# Patient Record
Sex: Female | Born: 1955 | Race: Black or African American | Hispanic: No | Smoking: Former smoker
Health system: Southern US, Community
[De-identification: ages and names within clinical notes are randomized; demographics above are authoritative.]

## PROBLEM LIST (undated history)

## (undated) DIAGNOSIS — I82409 Acute embolism and thrombosis of unspecified deep veins of unspecified lower extremity: Secondary | ICD-10-CM

## (undated) DIAGNOSIS — K5730 Diverticulosis of large intestine without perforation or abscess without bleeding: Secondary | ICD-10-CM

## (undated) DIAGNOSIS — Z8719 Personal history of other diseases of the digestive system: Secondary | ICD-10-CM

## (undated) DIAGNOSIS — I10 Essential (primary) hypertension: Secondary | ICD-10-CM

## (undated) DIAGNOSIS — K750 Abscess of liver: Secondary | ICD-10-CM

## (undated) DIAGNOSIS — N838 Other noninflammatory disorders of ovary, fallopian tube and broad ligament: Secondary | ICD-10-CM

## (undated) DIAGNOSIS — R7309 Other abnormal glucose: Secondary | ICD-10-CM

## (undated) DIAGNOSIS — K589 Irritable bowel syndrome without diarrhea: Secondary | ICD-10-CM

## (undated) DIAGNOSIS — N83202 Unspecified ovarian cyst, left side: Secondary | ICD-10-CM

## (undated) DIAGNOSIS — K651 Peritoneal abscess: Secondary | ICD-10-CM

## (undated) DIAGNOSIS — K5732 Diverticulitis of large intestine without perforation or abscess without bleeding: Secondary | ICD-10-CM

## (undated) DIAGNOSIS — H409 Unspecified glaucoma: Secondary | ICD-10-CM

## (undated) DIAGNOSIS — E669 Obesity, unspecified: Secondary | ICD-10-CM

## (undated) DIAGNOSIS — E559 Vitamin D deficiency, unspecified: Secondary | ICD-10-CM

## (undated) DIAGNOSIS — Z9289 Personal history of other medical treatment: Secondary | ICD-10-CM

## (undated) DIAGNOSIS — M1990 Unspecified osteoarthritis, unspecified site: Secondary | ICD-10-CM

## (undated) DIAGNOSIS — K8020 Calculus of gallbladder without cholecystitis without obstruction: Secondary | ICD-10-CM

## (undated) DIAGNOSIS — E7800 Pure hypercholesterolemia, unspecified: Secondary | ICD-10-CM

## (undated) DIAGNOSIS — M25562 Pain in left knee: Secondary | ICD-10-CM

## (undated) HISTORY — DX: Other abnormal glucose: R73.09

## (undated) HISTORY — DX: Vitamin D deficiency, unspecified: E55.9

## (undated) HISTORY — DX: Pure hypercholesterolemia, unspecified: E78.00

## (undated) HISTORY — DX: Pain in left knee: M25.562

## (undated) HISTORY — DX: Irritable bowel syndrome, unspecified: K58.9

## (undated) HISTORY — DX: Calculus of gallbladder without cholecystitis without obstruction: K80.20

## (undated) HISTORY — DX: Personal history of other diseases of the digestive system: Z87.19

## (undated) HISTORY — DX: Diverticulitis of large intestine without perforation or abscess without bleeding: K57.32

## (undated) HISTORY — DX: Obesity, unspecified: E66.9

## (undated) HISTORY — DX: Diverticulosis of large intestine without perforation or abscess without bleeding: K57.30

## (undated) HISTORY — DX: Acute embolism and thrombosis of unspecified deep veins of unspecified lower extremity: I82.409

## (undated) HISTORY — DX: Essential (primary) hypertension: I10

## (undated) HISTORY — DX: Unspecified glaucoma: H40.9

## (undated) HISTORY — DX: Abscess of liver: K75.0

## (undated) HISTORY — DX: Peritoneal abscess: K65.1

## (undated) HISTORY — DX: Unspecified ovarian cyst, left side: N83.202

---

## 1998-05-02 HISTORY — PX: COLONOSCOPY: SHX174

## 1999-01-11 ENCOUNTER — Other Ambulatory Visit: Admission: RE | Admit: 1999-01-11 | Discharge: 1999-01-11 | Payer: Self-pay | Admitting: Family Medicine

## 2002-09-03 ENCOUNTER — Encounter: Payer: Self-pay | Admitting: Family Medicine

## 2003-09-03 ENCOUNTER — Encounter: Payer: Self-pay | Admitting: Family Medicine

## 2003-09-09 ENCOUNTER — Emergency Department (HOSPITAL_COMMUNITY): Admission: EM | Admit: 2003-09-09 | Discharge: 2003-09-09 | Payer: Self-pay | Admitting: Family Medicine

## 2004-05-11 ENCOUNTER — Emergency Department (HOSPITAL_COMMUNITY): Admission: EM | Admit: 2004-05-11 | Discharge: 2004-05-11 | Payer: Self-pay | Admitting: Family Medicine

## 2004-05-12 ENCOUNTER — Emergency Department (HOSPITAL_COMMUNITY): Admission: EM | Admit: 2004-05-12 | Discharge: 2004-05-12 | Payer: Self-pay | Admitting: Family Medicine

## 2004-05-18 ENCOUNTER — Emergency Department (HOSPITAL_COMMUNITY): Admission: EM | Admit: 2004-05-18 | Discharge: 2004-05-18 | Payer: Self-pay | Admitting: Family Medicine

## 2004-05-25 ENCOUNTER — Emergency Department (HOSPITAL_COMMUNITY): Admission: EM | Admit: 2004-05-25 | Discharge: 2004-05-25 | Payer: Self-pay | Admitting: Family Medicine

## 2004-05-28 ENCOUNTER — Ambulatory Visit: Payer: Self-pay | Admitting: Family Medicine

## 2004-06-23 ENCOUNTER — Ambulatory Visit: Payer: Self-pay | Admitting: Family Medicine

## 2004-06-23 LAB — CONVERTED CEMR LAB: Blood Glucose, Fasting: 123 mg/dL

## 2004-06-29 ENCOUNTER — Ambulatory Visit: Payer: Self-pay | Admitting: Family Medicine

## 2004-06-29 ENCOUNTER — Other Ambulatory Visit: Admission: RE | Admit: 2004-06-29 | Discharge: 2004-06-29 | Payer: Self-pay | Admitting: *Deleted

## 2004-06-29 LAB — CONVERTED CEMR LAB: Pap Smear: NORMAL

## 2004-07-30 ENCOUNTER — Ambulatory Visit: Payer: Self-pay | Admitting: Family Medicine

## 2004-10-21 ENCOUNTER — Ambulatory Visit: Payer: Self-pay | Admitting: Family Medicine

## 2004-10-26 ENCOUNTER — Ambulatory Visit: Payer: Self-pay | Admitting: Family Medicine

## 2004-10-26 LAB — CONVERTED CEMR LAB
RBC count: 4.31 10*6/uL
WBC, blood: 4.4 10*3/uL

## 2004-12-07 ENCOUNTER — Ambulatory Visit: Payer: Self-pay | Admitting: Family Medicine

## 2005-09-05 ENCOUNTER — Ambulatory Visit: Payer: Self-pay | Admitting: Family Medicine

## 2007-06-11 ENCOUNTER — Encounter: Payer: Self-pay | Admitting: Family Medicine

## 2007-06-11 DIAGNOSIS — Z6841 Body Mass Index (BMI) 40.0 and over, adult: Secondary | ICD-10-CM

## 2007-06-12 ENCOUNTER — Ambulatory Visit: Payer: Self-pay | Admitting: Family Medicine

## 2007-06-12 DIAGNOSIS — E1169 Type 2 diabetes mellitus with other specified complication: Secondary | ICD-10-CM | POA: Insufficient documentation

## 2007-06-12 DIAGNOSIS — E785 Hyperlipidemia, unspecified: Secondary | ICD-10-CM | POA: Insufficient documentation

## 2007-06-12 LAB — CONVERTED CEMR LAB
ALT: 17 units/L (ref 0–35)
AST: 16 units/L (ref 0–37)
Albumin: 3.6 g/dL (ref 3.5–5.2)
Bilirubin, Direct: 0.1 mg/dL (ref 0.0–0.3)
Calcium: 9.2 mg/dL (ref 8.4–10.5)
Chloride: 100 meq/L (ref 96–112)
Cholesterol: 174 mg/dL (ref 0–200)
Creatinine, Ser: 1 mg/dL (ref 0.4–1.2)
GFR calc non Af Amer: 63 mL/min
Glucose, Bld: 112 mg/dL — ABNORMAL HIGH (ref 70–99)
HDL: 45.6 mg/dL (ref >39.0)
Sodium: 138 meq/L (ref 135–145)
TSH: 2.18 microintl units/mL (ref 0.35–5.50)
Total Bilirubin: 0.7 mg/dL (ref 0.3–1.2)

## 2007-06-18 ENCOUNTER — Encounter: Payer: Self-pay | Admitting: Family Medicine

## 2007-06-18 ENCOUNTER — Other Ambulatory Visit: Admission: RE | Admit: 2007-06-18 | Discharge: 2007-06-18 | Payer: Self-pay | Admitting: Family Medicine

## 2007-06-18 ENCOUNTER — Ambulatory Visit: Payer: Self-pay | Admitting: Family Medicine

## 2007-06-18 DIAGNOSIS — I10 Essential (primary) hypertension: Secondary | ICD-10-CM | POA: Insufficient documentation

## 2007-06-18 LAB — CONVERTED CEMR LAB: Pap Smear: NORMAL

## 2007-06-21 ENCOUNTER — Encounter (INDEPENDENT_AMBULATORY_CARE_PROVIDER_SITE_OTHER): Payer: Self-pay | Admitting: *Deleted

## 2007-07-06 ENCOUNTER — Ambulatory Visit: Payer: Self-pay | Admitting: Family Medicine

## 2007-07-09 ENCOUNTER — Encounter (INDEPENDENT_AMBULATORY_CARE_PROVIDER_SITE_OTHER): Payer: Self-pay | Admitting: *Deleted

## 2007-07-27 ENCOUNTER — Ambulatory Visit: Payer: Self-pay | Admitting: Family Medicine

## 2007-10-26 ENCOUNTER — Telehealth: Payer: Self-pay | Admitting: Family Medicine

## 2007-10-27 ENCOUNTER — Ambulatory Visit: Payer: Self-pay | Admitting: Family Medicine

## 2007-10-27 ENCOUNTER — Ambulatory Visit (HOSPITAL_COMMUNITY): Admission: RE | Admit: 2007-10-27 | Discharge: 2007-10-27 | Payer: Self-pay | Admitting: Family Medicine

## 2007-10-29 ENCOUNTER — Ambulatory Visit: Payer: Self-pay | Admitting: Family Medicine

## 2007-11-09 ENCOUNTER — Ambulatory Visit: Payer: Self-pay | Admitting: Family Medicine

## 2008-03-31 ENCOUNTER — Telehealth: Payer: Self-pay | Admitting: Family Medicine

## 2008-04-02 ENCOUNTER — Ambulatory Visit: Payer: Self-pay | Admitting: Family Medicine

## 2008-04-02 DIAGNOSIS — K589 Irritable bowel syndrome without diarrhea: Secondary | ICD-10-CM | POA: Insufficient documentation

## 2008-04-09 ENCOUNTER — Ambulatory Visit: Payer: Self-pay | Admitting: Family Medicine

## 2008-08-20 ENCOUNTER — Ambulatory Visit: Payer: Self-pay | Admitting: Family Medicine

## 2008-08-20 LAB — CONVERTED CEMR LAB
ALT: 21 units/L (ref 0–35)
AST: 19 units/L (ref 0–37)
Albumin: 3.7 g/dL (ref 3.5–5.2)
Alkaline Phosphatase: 53 units/L (ref 39–117)
Basophils Relative: 0.2 % (ref 0.0–3.0)
Calcium: 9.1 mg/dL (ref 8.4–10.5)
Eosinophils Relative: 1 % (ref 0.0–5.0)
GFR calc non Af Amer: 75.33 mL/min (ref >60)
Glucose, Bld: 104 mg/dL — ABNORMAL HIGH (ref 70–99)
HCT: 37.5 % (ref 36.0–46.0)
HDL: 48.6 mg/dL (ref >39.00)
Hemoglobin: 12.7 g/dL (ref 12.0–15.0)
Lymphocytes Relative: 48.8 % — ABNORMAL HIGH (ref 12.0–46.0)
Lymphs Abs: 2.3 10*3/uL (ref 0.7–4.0)
Monocytes Relative: 7 % (ref 3.0–12.0)
Neutro Abs: 2 10*3/uL (ref 1.4–7.7)
Potassium: 3.8 meq/L (ref 3.5–5.1)
RBC: 4.5 M/uL (ref 3.87–5.11)
Sodium: 141 meq/L (ref 135–145)
TSH: 3.25 microintl units/mL (ref 0.35–5.50)
Total CHOL/HDL Ratio: 4
Total Protein: 7.6 g/dL (ref 6.0–8.3)
VLDL: 13 mg/dL (ref 0.0–40.0)

## 2008-08-27 ENCOUNTER — Encounter: Payer: Self-pay | Admitting: Family Medicine

## 2008-08-27 ENCOUNTER — Ambulatory Visit: Payer: Self-pay | Admitting: Family Medicine

## 2008-08-27 ENCOUNTER — Other Ambulatory Visit: Admission: RE | Admit: 2008-08-27 | Discharge: 2008-08-27 | Payer: Self-pay | Admitting: Family Medicine

## 2008-08-27 LAB — CONVERTED CEMR LAB: Pap Smear: NORMAL

## 2008-09-02 ENCOUNTER — Telehealth: Payer: Self-pay | Admitting: Family Medicine

## 2008-09-10 ENCOUNTER — Encounter: Admission: RE | Admit: 2008-09-10 | Discharge: 2008-09-10 | Payer: Self-pay | Admitting: Family Medicine

## 2008-09-11 ENCOUNTER — Encounter (INDEPENDENT_AMBULATORY_CARE_PROVIDER_SITE_OTHER): Payer: Self-pay | Admitting: *Deleted

## 2008-10-01 ENCOUNTER — Ambulatory Visit: Payer: Self-pay | Admitting: Family Medicine

## 2008-10-01 LAB — CONVERTED CEMR LAB
BUN: 14 mg/dL (ref 6–23)
Calcium: 8.8 mg/dL (ref 8.4–10.5)
Chloride: 111 meq/L (ref 96–112)
Creatinine, Ser: 0.9 mg/dL (ref 0.4–1.2)

## 2008-10-08 ENCOUNTER — Ambulatory Visit: Payer: Self-pay | Admitting: Family Medicine

## 2008-11-19 ENCOUNTER — Ambulatory Visit: Payer: Self-pay | Admitting: Family Medicine

## 2008-12-24 ENCOUNTER — Ambulatory Visit: Payer: Self-pay | Admitting: Family Medicine

## 2008-12-24 LAB — CONVERTED CEMR LAB
Calcium: 9.1 mg/dL (ref 8.4–10.5)
GFR calc non Af Amer: 75.22 mL/min (ref >60)
Glucose, Bld: 116 mg/dL — ABNORMAL HIGH (ref 70–99)
Sodium: 140 meq/L (ref 135–145)

## 2008-12-31 ENCOUNTER — Ambulatory Visit: Payer: Self-pay | Admitting: Family Medicine

## 2008-12-31 DIAGNOSIS — E1169 Type 2 diabetes mellitus with other specified complication: Secondary | ICD-10-CM | POA: Insufficient documentation

## 2008-12-31 DIAGNOSIS — R7303 Prediabetes: Secondary | ICD-10-CM

## 2009-08-26 ENCOUNTER — Ambulatory Visit: Payer: Self-pay | Admitting: Family Medicine

## 2009-08-26 LAB — CONVERTED CEMR LAB
Albumin: 3.7 g/dL (ref 3.5–5.2)
Basophils Absolute: 0 10*3/uL (ref 0.0–0.1)
Bilirubin, Direct: 0.1 mg/dL (ref 0.0–0.3)
CO2: 29 meq/L (ref 19–32)
Calcium: 8.9 mg/dL (ref 8.4–10.5)
Creatinine, Ser: 1 mg/dL (ref 0.4–1.2)
Eosinophils Absolute: 0 10*3/uL (ref 0.0–0.7)
GFR calc non Af Amer: 75.02 mL/min (ref >60)
Glucose, Bld: 117 mg/dL — ABNORMAL HIGH (ref 70–99)
HCT: 37.9 % (ref 36.0–46.0)
HDL: 46.8 mg/dL (ref >39.00)
Lymphs Abs: 2.2 10*3/uL (ref 0.7–4.0)
MCV: 83.7 fL (ref 78.0–100.0)
Microalb Creat Ratio: 15.3 mg/g (ref 0.0–30.0)
Microalb, Ur: 3.8 mg/dL — ABNORMAL HIGH (ref 0.0–1.9)
Monocytes Absolute: 0.3 10*3/uL (ref 0.1–1.0)
Platelets: 222 10*3/uL (ref 150.0–400.0)
RDW: 15.4 % — ABNORMAL HIGH (ref 11.5–14.6)
TSH: 2.38 microintl units/mL (ref 0.35–5.50)
Total Protein: 7.1 g/dL (ref 6.0–8.3)
Triglycerides: 79 mg/dL (ref 0.0–149.0)

## 2009-09-02 ENCOUNTER — Other Ambulatory Visit: Admission: RE | Admit: 2009-09-02 | Discharge: 2009-09-02 | Payer: Self-pay | Admitting: Family Medicine

## 2009-09-02 ENCOUNTER — Ambulatory Visit: Payer: Self-pay | Admitting: Family Medicine

## 2009-09-02 DIAGNOSIS — E559 Vitamin D deficiency, unspecified: Secondary | ICD-10-CM

## 2009-09-02 DIAGNOSIS — M25561 Pain in right knee: Secondary | ICD-10-CM

## 2009-09-02 DIAGNOSIS — M25562 Pain in left knee: Secondary | ICD-10-CM

## 2009-09-02 DIAGNOSIS — G8929 Other chronic pain: Secondary | ICD-10-CM

## 2009-09-02 DIAGNOSIS — M170 Bilateral primary osteoarthritis of knee: Secondary | ICD-10-CM | POA: Insufficient documentation

## 2009-09-02 LAB — HM PAP SMEAR

## 2009-09-09 ENCOUNTER — Encounter (INDEPENDENT_AMBULATORY_CARE_PROVIDER_SITE_OTHER): Payer: Self-pay | Admitting: *Deleted

## 2009-09-16 ENCOUNTER — Encounter: Admission: RE | Admit: 2009-09-16 | Discharge: 2009-09-16 | Payer: Self-pay | Admitting: Family Medicine

## 2009-09-16 LAB — HM MAMMOGRAPHY: HM Mammogram: NORMAL

## 2009-10-21 ENCOUNTER — Ambulatory Visit: Payer: Self-pay | Admitting: Family Medicine

## 2009-10-28 ENCOUNTER — Ambulatory Visit: Payer: Self-pay | Admitting: Family Medicine

## 2009-11-26 ENCOUNTER — Telehealth: Payer: Self-pay | Admitting: Family Medicine

## 2009-12-08 ENCOUNTER — Encounter (INDEPENDENT_AMBULATORY_CARE_PROVIDER_SITE_OTHER): Payer: Self-pay | Admitting: *Deleted

## 2010-02-10 ENCOUNTER — Ambulatory Visit: Payer: Self-pay | Admitting: Family Medicine

## 2010-05-02 DIAGNOSIS — M25562 Pain in left knee: Secondary | ICD-10-CM

## 2010-05-02 HISTORY — DX: Pain in left knee: M25.562

## 2010-05-22 ENCOUNTER — Other Ambulatory Visit: Payer: Self-pay | Admitting: Family Medicine

## 2010-05-22 DIAGNOSIS — Z0000 Encounter for general adult medical examination without abnormal findings: Secondary | ICD-10-CM

## 2010-05-24 ENCOUNTER — Ambulatory Visit: Payer: Self-pay

## 2010-06-01 NOTE — Progress Notes (Signed)
Summary: requests nystatin cream  Phone Note Refill Request Call back at Home Phone [phone] Message from:  Patient  Refills Requested: Medication #1:  nystatin cream Phoned request from pt.  She says she uses this for heat rash.  [facility]ses [facility] [address].  Initial call taken by: Lowella Petties CMA,  November 26, 2009 12:55 PM  Follow-up for Phone Call        please call in nystatin cream 100,000 units/gram.  AAA two times a day as needed.  15g with 2rf.  thanks.  Follow-up by: Crawford Givens MD,  November 26, 2009 1:22 PM    New/Updated Medications: NYSTATIN 100000 UNIT/GM CREA (NYSTATIN) Apply to affected area two times a day as needed Prescriptions: NYSTATIN 100000 UNIT/GM CREA (NYSTATIN) Apply to affected area two times a day as needed  #15 gram tube x 2   Entered by:   Delilah Shan CMA (AAMA)   Authorized by:   Crawford Givens MD   Signed by:   Delilah Shan CMA (AAMA) on 11/26/2009   Method used:   Electronically to        [facility] [address]       [address], [address]       [place], [place]  [address]       Ph: [phone]       Fax: [phone]   RxID:   [id number]

## 2010-06-01 NOTE — Letter (Signed)
Summary: Results Follow up Letter  [place] at [facility]  [address] [place], [place] [address]   Phone: [phone]  Fax: [phone]    09/09/2009 MRN: [id number]  [facility] [place] [address] [place], [place]  [address]  Dear Ms. Gaede,  The following are the results of your recent test(s):  Test         Result    Pap Smear:        Normal __X___  Not Normal _____ Comments: Please repeat in one year. ______________________________________________________ Cholesterol: LDL(Bad cholesterol):         Your goal is less than:         HDL (Good cholesterol):       Your goal is more than: Comments:  ______________________________________________________ Mammogram:        Normal _____  Not Normal _____ Comments:  ___________________________________________________________________ Hemoccult:        Normal _____  Not normal _______ Comments:    _____________________________________________________________________ Other Tests:    We routinely do not discuss normal results over the telephone.  If you desire a copy of the results, or you have any questions about this information we can discuss them at your next office visit.   Sincerely,    Laurita Quint, MD

## 2010-06-01 NOTE — Assessment & Plan Note (Signed)
Summary: FLU SHOT/JRR  Nurse Visit   Allergies: 1)  ! Penicillin 2)  ! Lisinopril (Lisinopril)  Orders Added: 1)  Admin 1st Vaccine [90471] 2)  Flu Vaccine 35yrs + [[address]]  Flu Vaccine Consent Questions     Do you have a history of severe allergic reactions to this vaccine? no    Any prior history of allergic reactions to egg and/or gelatin? no    Do you have a sensitivity to the preservative Thimersol? no    Do you have a past history of Guillan-Barre Syndrome? no    Do you currently have an acute febrile illness? no    Have you ever had a severe reaction to latex? no    Vaccine information given and explained to patient? yes    Are you currently pregnant? no    Lot Number:[id number]   Exp Date:10/30/2010   Site Given  Left Deltoid IM

## 2010-06-01 NOTE — Assessment & Plan Note (Signed)
Summary: CPX / LFW   Vital Signs:  Patient profile:   55 year old female Weight:      318 pounds Temp:     97.7 degrees F oral Pulse rate:   76 / minute Pulse rhythm:   regular BP sitting:   140 / 100  (left arm) Cuff size:   large  Vitals Entered By: Sydell Axon LPN (Sep 02, 9561 9:28 AM) CC: 30 Minute checkup, had a colonoscopy about 3 years ago with Dr. Jarold Motto per patient   History of Present Illness: Pt here for Comp Exam. She has pain in her knees, feels she popped it out of place. She doesn't remember any event that caused it.  Preventive Screening-Counseling & Management  Alcohol-Tobacco     Alcohol type: whatever once or twice a year     Smoking Status: never     Passive Smoke Exposure: no  Caffeine-Diet-Exercise     Caffeine use/day: 3     Does Patient Exercise: no  Problems Prior to Update: 1)  Hyperglycemia  (ICD-790.29) 2)  Other Screening Mammogram  (ICD-V76.12) 3)  Diverticulosis of Colon  (ICD-562.10) 4)  Ibs  (ICD-564.1) 5)  Special Screening Malig Neoplasms Other Sites  (ICD-V76.49) 6)  Health Maintenance Exam  (ICD-V70.0) 7)  Hypercholesterolemia  (ICD-272.0) 8)  Obesity  (ICD-278.00) 9)  Hypertension, Benign Essential  (ICD-401.1)  Medications Prior to Update: 1)  Maxzide-25 37.5-25 Mg Tabs (Triamterene-Hctz) .... One Trab By Mouth in Am 2)  Bentyl 10 Mg Caps (Dicyclomine Hcl) .... One Tab By Mouth 4 Times A Day. As Needed 3)  Norvasc 5 Mg Tabs (Amlodipine Besylate) .... One Tab By Mouth At Night.  Allergies: 1)  ! Penicillin 2)  ! Lisinopril (Lisinopril)  Past History:  Past Surgical History: Last updated: 08/27/2008 C/S due to BP ELEVATION Colonoscopy Diverticuli  (Dr Jarold Motto) 2000 MRI OF BRAIN WITH AND WITHOUT CONTRAST:MILD PROPTOSIS:?MARROW SIGNALS:(08/30/2004)  Family History: Last updated: 09/02/2009 Father:: A  79  HTN  CABGx2 Mother:dec 56  LUNG CANCER ; QUIT 20 YEARS PIOR TO DIAGNISIS ;HTN  BROTHER A 57 Sleep Apnea w/  UVPP ESTRANGED CV: NEGATIVE HBP: +FATHER, +MOTHER; +SELF DM: POSITIVE GOUT/ARTHRITIS: PROSTATE CANCER: + FATHER //+MOTHER LUNG BREAST/OVARIAN/UTERINE CANCER: COLON CANCER: DEPRESSION: NEGATIVE ETOH/DRUG ABUSE: + BROTHER DRUGS OTHER: +STROKE MGM  Social History: Last updated: 08/27/2008 Marital Status: DIVORCED Children: 1 DAUGHTER AT [place].STATE CHEM[employer] ENGINE[employer] Occupation: Systems developer, Dept of  [employer]  Risk Factors: Caffeine Use: 3 (09/02/2009) Exercise: no (09/02/2009)  Risk Factors: Smoking Status: never (09/02/2009) Passive Smoke Exposure: no (09/02/2009)  Family History: Father:: A  79  HTN  CABGx2 Mother:dec 56  LUNG CANCER ; QUIT 20 YEARS PIOR TO DIAGNISIS ;HTN  BROTHER A 57 Sleep Apnea w/ UVPP ESTRANGED CV: NEGATIVE HBP: +FATHER, +MOTHER; +SELF DM: POSITIVE GOUT/ARTHRITIS: PROSTATE CANCER: + FATHER //+MOTHER LUNG BREAST/OVARIAN/UTERINE CANCER: COLON CANCER: DEPRESSION: NEGATIVE ETOH/DRUG ABUSE: + BROTHER DRUGS OTHER: +STROKE MGM  Review of Systems General:  Denies chills, fatigue, fever, sweats, weakness, and weight loss. Eyes:  Complains of discharge; denies blurring and eye pain; allergies. ENT:  Denies decreased hearing, earache, and ringing in ears. CV:  Denies chest pain or discomfort, fainting, fatigue, shortness of breath with exertion, swelling of feet, and swelling of hands. Resp:  Denies cough, shortness of breath, and wheezing. GI:  Denies abdominal pain, bloody stools, change in bowel habits, constipation, dark tarry stools, diarrhea, indigestion, loss of appetite, nausea, vomiting, vomiting blood, and yellowish skin color. GU:  Denies discharge, dysuria, nocturia, and urinary frequency. MS:  Complains of joint pain; denies low back pain, muscle aches, cramps, and stiffness; knee L>R. Derm:  Denies dryness, itching, and rash. Neuro:  Denies numbness, poor balance, tingling, and tremors.  Physical Exam  General:   Well-developed,well-nourished,in no acute distress; alert,appropriate and cooperative throughout examination, obese. Head:  Normocephalic and atraumatic without obvious abnormalities. No apparent alopecia or balding. Sinuses NT. Eyes:  Conjunctiva clear bilaterally.  Ears:  External ear exam shows no significant lesions or deformities.  Otoscopic examination reveals clear canals, tympanic membranes are intact bilaterally without bulging, retraction, inflammation or discharge. Hearing is grossly normal bilaterally. Nose:  External nasal examination shows no deformity or inflammation. Nasal mucosa are pink and moist without lesions or exudates. Mouth:  Oral mucosa and oropharynx without lesions or exudates.  Teeth in good repair. Neck:  No deformities, masses, or tenderness noted. Chest Wall:  No deformities, masses, or tenderness noted. Breasts:  No mass, nodules, thickening, tenderness, bulging, retraction, inflamation, nipple discharge or skin changes noted.   Lungs:  Normal respiratory effort, chest expands symmetrically. Lungs are clear to auscultation, no crackles or wheezes. Heart:  Normal rate and regular rhythm. S1 and S2 normal without gallop, murmur, click, rub or other extra sounds. Abdomen:  Bowel sounds positive,abdomen soft and non-tender without masses, organomegaly or hernias noted. No pain to extended palpation, esp LLQ. Obese with extensive panniculous. Rectal:  No external abnormalities noted. Normal sphincter tone. No rectal masses or tenderness.   Gneg. Genitalia:  Pelvic Exam:        External: normal female genitalia without lesions or masses        Vagina: normal without lesions or masses        Cervix: normal without lesions or masses        Adnexa: normal bimanual exam without masses or fullness        Uterus: normal by palpation        Pap smear: performed     Difficult exam. Msk:  No deformity or scoliosis noted of thoracic or lumbar spine.   Pulses:  R and L  carotid,radial,femoral,dorsalis pedis and posterior tibial pulses are full and equal bilaterally Extremities:  Difficult to assess edema due to size...hands may be swollen. Neurologic:  No cranial nerve deficits noted. Station and gait are normal. Plantar reflexes are down-going bilaterally. DTRs are symmetrical throughout. Sensory, motor and coordinative functions appear intact. Skin:  Intact without suspicious lesions or rashes Cervical Nodes:  No lymphadenopathy noted Axillary Nodes:  No palpable lymphadenopathy Inguinal Nodes:  No significant adenopathy Psych:  Cognition and judgment appear intact. Alert and cooperative with normal attention span and concentration. No apparent delusions, illusions, hallucinations   Impression & Recommendations:  Problem # 1:  HEALTH MAINTENANCE EXAM (ICD-V70.0)  Problem # 2:  HYPERGLYCEMIA (ICD-790.29)  Problem # 3:  OTHER SCREENING MAMMOGRAM (ICD-V76.12) Assessment: Unchanged Will be done next week.  Problem # 4:  DIVERTICULOSIS OF COLON (ICD-562.10) Assessment: Unchanged  Discussed coming in to be seen for prolonged LLQ discomfort.  Labs Reviewed: Hgb: 12.6 (08/26/2009)   Hct: 37.9 (08/26/2009)   WBC: 4.6 (08/26/2009)  Problem # 5:  IBS (ICD-564.1) Assessment: Unchanged Stable. Discussed regularity.  Problem # 6:  HYPERCHOLESTEROLEMIA (ICD-272.0) Assessment: Unchanged Adequate. Try to avoid fatty foods. Labs Reviewed: SGOT: 17 (08/26/2009)   SGPT: 20 (08/26/2009)   HDL:46.80 (08/26/2009), 48.60 (08/20/2008)  LDL:113 (08/26/2009), 115 (08/20/2008)  Chol:176 (08/26/2009), 177 (08/20/2008)  Trig:79.0 (08/26/2009), 65.0 (08/20/2008)  Problem #  7:  OBESITY (ICD-278.00) Assessment: Unchanged  Google weight loss programs and start watching diet.  Ht: 62 (12/31/2008)   Wt: 318 (09/02/2009)   BMI: 58.92 (12/31/2008)  Problem # 8:  HYPERTENSION, BENIGN ESSENTIAL (ICD-401.1) Assessment: Deteriorated Start Vit D repl and recheck end of  Jun. Her updated medication list for this problem includes:    Maxzide-25 37.5-25 Mg Tabs (Triamterene-hctz) ..... One tab by mouth in am    Norvasc 5 Mg Tabs (Amlodipine besylate) ..... One tab by mouth at night.  BP today: 140/100 Prior BP: 130/85 (12/31/2008)  Labs Reviewed: K+: 3.7 (08/26/2009) Creat: : 1.0 (08/26/2009)   Chol: 176 (08/26/2009)   HDL: 46.80 (08/26/2009)   LDL: 113 (08/26/2009)   TG: 79.0 (08/26/2009)  Problem # 9:  VITAMIN D DEFICIENCY (ICD-268.9) Assessment: New Start Vit D 50000Iu weekly. Recheck end of Jun.  Problem # 10:  KNEE PAIN, LEFT (HKV-425.95) Assessment: New  Start Tyl augmented with Aleve. Use heat and ice.   Complete Medication List: 1)  Maxzide-25 37.5-25 Mg Tabs (Triamterene-hctz) .... One tab by mouth in am 2)  Bentyl 10 Mg Caps (Dicyclomine hcl) .... One tab by mouth 4 times a day. as needed 3)  Norvasc 5 Mg Tabs (Amlodipine besylate) .... One tab by mouth at night. 4)  Vitamin D (ergocalciferol) 50000 Unit Caps (Ergocalciferol) .... One tab by mouth weekly  Patient Instructions: 1)  Start Vit D weekly 2)  RTC end of Jun with Vit D lvl prior 268.9 3)  Recheck BP then. 4)  For knees, Limit Tyl to 1000 three times a day. Add Aleve 2 after brfst and supper and then cut back as able. Prescriptions: VITAMIN D (ERGOCALCIFEROL) 50000 UNIT CAPS (ERGOCALCIFEROL) one tab by mouth weekly  #4 x 3   Entered and Authorized by:   Shaune Leeks MD   Signed by:   Shaune Leeks MD on 09/02/2009   Method used:   Electronically to        [facility] [address] ([facility]ail)       [address], [address]       [place], [place]  [address]       Ph: [phone]       Fax: [phone]   RxID:   [id number]   Current Allergies (reviewed today): ! PENICILLIN ! LISINOPRIL (LISINOPRIL)

## 2010-06-01 NOTE — Assessment & Plan Note (Signed)
Summary: cut finger/1:30/alc   Vital Signs:  Patient profile:   55 year old female Height:      62 inches Weight:      321.75 pounds BMI:     59.06 Temp:     98.4 degrees F oral Pulse rate:   84 / minute Pulse rhythm:   regular BP sitting:   148 / 100  (left arm) Cuff size:   large  Vitals Entered By: Lewanda Rife LPN (February 10, 2010 1:36 PM) CC: cut right index finger with new pill cutter   History of Present Illness: Pt here for having cut her finger on a pill cutter this AM and got concerned because she could not get the  bleeding to stop. She placed Neosporin on the area before leaving for the office and by the time she got here, it had stopped. THe cut is along the lateral nailbed and into the distal fat pad. She has washed it with soap and water prior to the Neosporin and her tetanus is UTD.  Problems Prior to Update: 1)  Knee Pain, Left  (ICD-719.46) 2)  Vitamin D Deficiency  (ICD-268.9) 3)  Hyperglycemia  (ICD-790.29) 4)  Other Screening Mammogram  (ICD-V76.12) 5)  Diverticulosis of Colon  (ICD-562.10) 6)  Ibs  (ICD-564.1) 7)  Special Screening Malig Neoplasms Other Sites  (ICD-V76.49) 8)  Health Maintenance Exam  (ICD-V70.0) 9)  Hypercholesterolemia  (ICD-272.0) 10)  Obesity  (ICD-278.00) 11)  Hypertension, Benign Essential  (ICD-401.1)  Medications Prior to Update: 1)  Maxzide-25 37.5-25 Mg Tabs (Triamterene-Hctz) .... One Tab By Mouth in Am 2)  Bentyl 10 Mg Caps (Dicyclomine Hcl) .... One Tab By Mouth 4 Times A Day. As Needed 3)  Norvasc 5 Mg Tabs (Amlodipine Besylate) .... One Tab By Mouth At Night. 4)  Vitamin D (Ergocalciferol) 50000 Unit Caps (Ergocalciferol) .... One Tab By Mouth Weekly 5)  Nystatin 100000 Unit/gm Crea (Nystatin) .... Apply To Affected Area Two Times A Day As Needed  Allergies: 1)  ! Penicillin 2)  ! Lisinopril (Lisinopril)  Physical Exam  General:  Well-developed,well-nourished,in no acute distress; alert,appropriate and cooperative  throughout examination, obese, well composed. Extremities:  Rt index finger with cut along the lateral border of the nail and distally into the dorsal tuft. Also another small puncture-like wound on the extreme distal tuft. Bleeding stopped when seen.   Impression & Recommendations:  Problem # 1:  LACERATION OF RIGHT INDEX FINGER (ICD-883.0) Assessment New Steri stripped area. Neosporin already applied. Discussed hygiene. Given extra strips to replace as needed. Tdap UTD. RTC if sxs worsen.  Complete Medication List: 1)  Maxzide-25 37.5-25 Mg Tabs (Triamterene-hctz) .... One tab by mouth in am 2)  Bentyl 10 Mg Caps (Dicyclomine hcl) .... One tab by mouth 4 times a day. as needed 3)  Norvasc 5 Mg Tabs (Amlodipine besylate) .... One tab by mouth at night. 4)  Vitamin D (ergocalciferol) 50000 Unit Caps (Ergocalciferol) .... One tab by mouth weekly 5)  Nystatin 100000 Unit/gm Crea (Nystatin) .... Apply to affected area two times a day as needed 6)  Vitamin D 1000 Unit Tabs (Cholecalciferol) .... One tablet by mouth twice a day  Patient Instructions: 1)  RTC if bleeding recurs or swelling/pus is visible.  Current Allergies (reviewed today): ! PENICILLIN ! LISINOPRIL (LISINOPRIL)

## 2010-06-01 NOTE — Letter (Signed)
Summary: Nadara Eaton letter  [place] at [facility]  [address] [place], [place] [address]   Phone: [phone]  Fax: [phone]       12/08/2009 MRN: [id number]  [facility] [place] [address] [place], [place]  [address]  Dear Ms. Osuch,  [facility] announce the retirement of Arta Silence, M.D., from full-time practice at the [facility] office effective October 29, 2009 and his plans of returning part-time.  It is important to Dr. Hetty Ely and to our practice that you understand that [facility] has seven physicians in our office for your health care needs.  We will continue to offer the same exceptional care that you have today.    Dr. Hetty Ely has spoken to many of you about his plans for retirement and returning part-time in the fall.   We will continue to work with you through the transition to schedule appointments for you in the office and meet the high standards that  is committed to.   Again, it is with great pleasure that we share the news that Dr. Hetty Ely will return to [facility] in October of 2011 with a reduced schedule.    If you have any questions, or would like to request an appointment with one of our physicians, please call us at [phone] and press the option for Scheduling an appointment.  We take pleasure in providing you with excellent patient care and look forward to seeing you at your next office visit.  Our [facility] Physicians are:  Tillman Abide, M.D. Laurita Quint, M.D. Roxy Manns, M.D. Kerby Nora, M.D. Hannah Beat, M.D. Ruthe Mannan, M.D. We proudly welcomed Raechel Ache, M.D. and Eustaquio Boyden, M.D. to the practice in July/August 2011.  Sincerely,   [facility]

## 2010-06-01 NOTE — Letter (Signed)
Summary: [place] [employer]-Concealed Handgun Permits Form  [place] [employer]-Concealed Handgun Permits Form   Imported By: Beau Fanny 09/02/2009 14:54:57  _____________________________________________________________________  External Attachment:    Type:   Image     Comment:   External Document

## 2010-06-01 NOTE — Assessment & Plan Note (Signed)
Summary: F/U AFTER LABS / LFW   Vital Signs:  Patient profile:   55 year old female Weight:      320.50 pounds BMI:     58.83 Temp:     98.5 degrees F oral Pulse rate:   72 / minute Pulse rhythm:   regular BP sitting:   128 / 84  (right arm) Cuff size:   large  Vitals Entered By: Sydell Axon LPN (October 28, 2009 10:13 AM) CC: 2 Month follow-up after labs, only took Vitamin D for one month, did not realize that she had a refill on it   History of Present Illness: Pt here for BP and Vit D recheck. She feels well and has no complaints. She has a Actuary due soon and is looking to that for motivation to lose weight.  She took Vit D weekly only for one month but tolerated it well. She misunderstood the directions.   Problems Prior to Update: 1)  Knee Pain, Left  (ICD-719.46) 2)  Vitamin D Deficiency  (ICD-268.9) 3)  Hyperglycemia  (ICD-790.29) 4)  Other Screening Mammogram  (ICD-V76.12) 5)  Diverticulosis of Colon  (ICD-562.10) 6)  Ibs  (ICD-564.1) 7)  Special Screening Malig Neoplasms Other Sites  (ICD-V76.49) 8)  Health Maintenance Exam  (ICD-V70.0) 9)  Hypercholesterolemia  (ICD-272.0) 10)  Obesity  (ICD-278.00) 11)  Hypertension, Benign Essential  (ICD-401.1)  Medications Prior to Update: 1)  Maxzide-25 37.5-25 Mg Tabs (Triamterene-Hctz) .... One Tab By Mouth in Am 2)  Bentyl 10 Mg Caps (Dicyclomine Hcl) .... One Tab By Mouth 4 Times A Day. As Needed 3)  Norvasc 5 Mg Tabs (Amlodipine Besylate) .... One Tab By Mouth At Night. 4)  Vitamin D (Ergocalciferol) 50000 Unit Caps (Ergocalciferol) .... One Tab By Mouth Weekly  Allergies: 1)  ! Penicillin 2)  ! Lisinopril (Lisinopril)  Physical Exam  General:  Well-developed,well-nourished,in no acute distress; alert,appropriate and cooperative throughout examination, obese. Head:  Normocephalic and atraumatic without obvious abnormalities. No apparent alopecia or balding. Sinuses NT. Eyes:  Conjunctiva clear  bilaterally.  Ears:  External ear exam shows no significant lesions or deformities.  Otoscopic examination reveals clear canals, tympanic membranes are intact bilaterally without bulging, retraction, inflammation or discharge. Hearing is grossly normal bilaterally. Nose:  External nasal examination shows no deformity or inflammation. Nasal mucosa are pink and moist without lesions or exudates. Mouth:  Oral mucosa and oropharynx without lesions or exudates.  Teeth in good repair. Neck:  No deformities, masses, or tenderness noted. Lungs:  Normal respiratory effort, chest expands symmetrically. Lungs are clear to auscultation, no crackles or wheezes. Heart:  Normal rate and regular rhythm. S1 and S2 normal without gallop, murmur, click, rub or other extra sounds.   Impression & Recommendations:  Problem # 1:  VITAMIN D DEFICIENCY (ICD-268.9) Assessment Improved Better than it was but still deficient. Will start Vit D OTC one tab by mouth two times a day regularly until seen again.  Problem # 2:  HYPERTENSION, BENIGN ESSENTIAL (ICD-401.1) Assessment: Improved Only change was adding Vit D since last time but BP significantly improved. Cont curr meds. Her updated medication list for this problem includes:    Maxzide-25 37.5-25 Mg Tabs (Triamterene-hctz) ..... One tab by mouth in am    Norvasc 5 Mg Tabs (Amlodipine besylate) ..... One tab by mouth at night.  BP today: 128/84 Prior BP: 140/100 (09/02/2009)  Labs Reviewed: K+: 3.7 (08/26/2009) Creat: : 1.0 (08/26/2009)   Chol: 176 (08/26/2009)   HDL:  46.80 (08/26/2009)   LDL: 113 (08/26/2009)   TG: 79.0 (08/26/2009)  Problem # 3:  OBESITY (ICD-278.00) Assessment: Unchanged Gained two pounds since last time and discussed losing and reasons it will improve her health. She unrderstands and is motivated to lose. Ht: 62 (12/31/2008)   Wt: 320.50 (10/28/2009)   BMI: 58.83 (10/28/2009)  Complete Medication List: 1)  Maxzide-25 37.5-25 Mg Tabs  (Triamterene-hctz) .... One tab by mouth in am 2)  Bentyl 10 Mg Caps (Dicyclomine hcl) .... One tab by mouth 4 times a day. as needed 3)  Norvasc 5 Mg Tabs (Amlodipine besylate) .... One tab by mouth at night. 4)  Vitamin D (ergocalciferol) 50000 Unit Caps (Ergocalciferol) .... One tab by mouth weekly  Patient Instructions: 1)  RTC 5/12 for Comp Exam, labs prior.  Current Allergies (reviewed today): ! PENICILLIN ! LISINOPRIL (LISINOPRIL)

## 2010-07-10 ENCOUNTER — Encounter: Payer: Self-pay | Admitting: Family Medicine

## 2010-08-16 ENCOUNTER — Other Ambulatory Visit: Payer: Self-pay | Admitting: Family Medicine

## 2010-08-16 DIAGNOSIS — I10 Essential (primary) hypertension: Secondary | ICD-10-CM

## 2010-08-16 DIAGNOSIS — K5730 Diverticulosis of large intestine without perforation or abscess without bleeding: Secondary | ICD-10-CM

## 2010-08-16 DIAGNOSIS — R7309 Other abnormal glucose: Secondary | ICD-10-CM

## 2010-08-16 DIAGNOSIS — E7800 Pure hypercholesterolemia, unspecified: Secondary | ICD-10-CM

## 2010-08-16 DIAGNOSIS — E559 Vitamin D deficiency, unspecified: Secondary | ICD-10-CM

## 2010-09-01 ENCOUNTER — Other Ambulatory Visit (INDEPENDENT_AMBULATORY_CARE_PROVIDER_SITE_OTHER): Payer: BC Managed Care – PPO | Admitting: Family Medicine

## 2010-09-01 DIAGNOSIS — E559 Vitamin D deficiency, unspecified: Secondary | ICD-10-CM

## 2010-09-01 DIAGNOSIS — E7800 Pure hypercholesterolemia, unspecified: Secondary | ICD-10-CM

## 2010-09-01 DIAGNOSIS — I10 Essential (primary) hypertension: Secondary | ICD-10-CM

## 2010-09-01 DIAGNOSIS — K5730 Diverticulosis of large intestine without perforation or abscess without bleeding: Secondary | ICD-10-CM

## 2010-09-01 DIAGNOSIS — R7309 Other abnormal glucose: Secondary | ICD-10-CM

## 2010-09-01 LAB — RENAL FUNCTION PANEL
BUN: 13 mg/dL (ref 6–23)
CO2: 31 mEq/L (ref 19–32)
Creatinine, Ser: 1 mg/dL (ref 0.4–1.2)
GFR: 72.22 mL/min (ref >60.00)
Glucose, Bld: 111 mg/dL — ABNORMAL HIGH (ref 70–99)
Phosphorus: 4.1 mg/dL (ref 2.3–4.6)
Sodium: 140 mEq/L (ref 135–145)

## 2010-09-01 LAB — CBC WITH DIFFERENTIAL/PLATELET
Basophils Relative: 0.5 % (ref 0.0–3.0)
Hemoglobin: 12.7 g/dL (ref 12.0–15.0)
Lymphocytes Relative: 53.3 % — ABNORMAL HIGH (ref 12.0–46.0)
Monocytes Relative: 6.6 % (ref 3.0–12.0)
Neutro Abs: 1.8 10*3/uL (ref 1.4–7.7)
Neutrophils Relative %: 38.4 % — ABNORMAL LOW (ref 43.0–77.0)
RBC: 4.61 Mil/uL (ref 3.87–5.11)
WBC: 4.7 10*3/uL (ref 4.5–10.5)

## 2010-09-01 LAB — HEPATIC FUNCTION PANEL
ALT: 20 U/L (ref 0–35)
AST: 17 U/L (ref 0–37)
Albumin: 3.6 g/dL (ref 3.5–5.2)
Alkaline Phosphatase: 58 U/L (ref 39–117)
Total Protein: 7.2 g/dL (ref 6.0–8.3)

## 2010-09-01 LAB — BASIC METABOLIC PANEL
CO2: 31 mEq/L (ref 19–32)
Calcium: 9.1 mg/dL (ref 8.4–10.5)
GFR: 72.22 mL/min (ref >60.00)
Sodium: 140 mEq/L (ref 135–145)

## 2010-09-01 LAB — LIPID PANEL
Cholesterol: 190 mg/dL (ref 0–200)
VLDL: 14.8 mg/dL (ref 0.0–40.0)

## 2010-09-01 LAB — TSH: TSH: 2.95 u[IU]/mL (ref 0.35–5.50)

## 2010-09-08 ENCOUNTER — Ambulatory Visit (INDEPENDENT_AMBULATORY_CARE_PROVIDER_SITE_OTHER): Payer: BC Managed Care – PPO | Admitting: Family Medicine

## 2010-09-08 ENCOUNTER — Encounter: Payer: Self-pay | Admitting: Family Medicine

## 2010-09-08 DIAGNOSIS — I10 Essential (primary) hypertension: Secondary | ICD-10-CM

## 2010-09-08 DIAGNOSIS — E559 Vitamin D deficiency, unspecified: Secondary | ICD-10-CM

## 2010-09-08 DIAGNOSIS — K5730 Diverticulosis of large intestine without perforation or abscess without bleeding: Secondary | ICD-10-CM

## 2010-09-08 DIAGNOSIS — R7309 Other abnormal glucose: Secondary | ICD-10-CM

## 2010-09-08 DIAGNOSIS — E669 Obesity, unspecified: Secondary | ICD-10-CM

## 2010-09-08 DIAGNOSIS — M25569 Pain in unspecified knee: Secondary | ICD-10-CM

## 2010-09-08 DIAGNOSIS — E7800 Pure hypercholesterolemia, unspecified: Secondary | ICD-10-CM

## 2010-09-08 DIAGNOSIS — K589 Irritable bowel syndrome without diarrhea: Secondary | ICD-10-CM

## 2010-09-08 MED ORDER — NYSTATIN 100000 UNIT/GM EX CREA: 1.0000 "application " | TOPICAL_CREAM | Freq: Two times a day (BID) | CUTANEOUS | Status: DC | PRN

## 2010-09-08 MED ORDER — DICYCLOMINE HCL 10 MG PO CAPS: 10.0000 mg | ORAL_CAPSULE | Freq: Four times a day (QID) | ORAL | Status: DC | PRN

## 2010-09-08 NOTE — Patient Instructions (Signed)
Refer to Ortho for left knee pain and instabiltiy. Increase Vit D to 2000 AM, 1000 PM.

## 2010-09-08 NOTE — Assessment & Plan Note (Signed)
Will refer to ortho for eval

## 2010-09-08 NOTE — Assessment & Plan Note (Signed)
Good control. Cont curr meds. BP Readings from Last 3 Encounters:  09/08/10 128/80  02/10/10 148/100  10/28/09 128/84

## 2010-09-08 NOTE — Assessment & Plan Note (Signed)
Stable. Hasn't needed Bentyl in the last entire year.

## 2010-09-08 NOTE — Progress Notes (Signed)
  Subjective:    Patient ID: Leah Peterson, female    DOB: 02-25-1956, 55 y.o.   MRN: [id number]  HPI Pt here for Comp Exam. She has no complaints. She continues to have left knee pain and feels it is "caving In'. It has given way on her. She has never had an abnormal Pap. We will skip Pap this year. Mammo scheduuled May 23rd.    Review of Systems  Constitutional: Negative for fever, chills, diaphoresis, activity change, appetite change, fatigue and unexpected weight change.  HENT: Negative for hearing loss, ear pain, nosebleeds, rhinorrhea, trouble swallowing, tinnitus and ear discharge.   Eyes: Negative for pain, discharge, redness and visual disturbance.  Respiratory: Negative for cough, chest tightness, shortness of breath and wheezing.   Cardiovascular: Negative for chest pain, palpitations and leg swelling.       No Fainting or Fatigue.  Gastrointestinal: Negative for nausea, vomiting, abdominal pain, diarrhea, constipation, blood in stool and abdominal distention.       No Heartburn. Uses Bentyl occas. She has not used this year.   Genitourinary: Negative for dysuria and frequency.  Musculoskeletal: Negative for myalgias, back pain and arthralgias.  Skin: Negative for rash.       No Itching or Dryness.  Neurological: Negative for dizziness, tremors, syncope and numbness.       No Tingling. No Balance Problems.  Hematological: Negative for adenopathy. Does not bruise/bleed easily.  Psychiatric/Behavioral: Negative for hallucinations, dysphoric mood and agitation. The patient is not nervous/anxious.        Objective:   Physical Exam  Constitutional: She is oriented to person, place, and time. She appears well-developed and well-nourished. No distress.  HENT:  Head: Normocephalic and atraumatic.  Left Ear: External ear normal.  Nose: Nose normal.  Mouth/Throat: Oropharynx is clear and moist. No oropharyngeal exudate.  Eyes: Conjunctivae and EOM are normal. Pupils are  equal, round, and reactive to light. No scleral icterus.  Neck: Normal range of motion. Neck supple. No thyromegaly present.  Cardiovascular: Normal rate, regular rhythm and normal heart sounds.  Exam reveals no friction rub.   No murmur heard. Pulmonary/Chest: Effort normal and breath sounds normal. No respiratory distress. She has no wheezes. She has no rales.  Abdominal: Soft. Bowel sounds are normal. She exhibits no mass. There is no tenderness.  Genitourinary: Vagina normal and uterus normal. Guaiac negative stool.  Musculoskeletal: Normal range of motion. She exhibits no edema and no tenderness.       L knee with nml joint line. Has sensation of pain and instability. No overt swelling appreciable. No erythema noted.  Lymphadenopathy:    She has no cervical adenopathy.  Neurological: She is alert and oriented to person, place, and time. She has normal reflexes.  Skin: Skin is warm and dry. No rash noted. She is not diaphoretic. No erythema.  Psychiatric: She has a normal mood and affect. Her behavior is normal. Judgment and thought content normal.          Assessment & Plan:

## 2010-09-08 NOTE — Assessment & Plan Note (Signed)
Not bad. Try to get LDL lower via avoiding fatty foods. Lab Results  Component Value Date   CHOL 190 09/01/2010   CHOL 176 08/26/2009   CHOL 177 08/20/2008   Lab Results  Component Value Date   HDL 47.90 09/01/2010   HDL 46.80 08/26/2009   HDL 40.10 08/20/2008   Lab Results  Component Value Date   LDLCALC 127* 09/01/2010   LDLCALC 113* 08/26/2009   LDLCALC 115* 08/20/2008   Lab Results  Component Value Date   TRIG 74.0 09/01/2010   TRIG 79.0 08/26/2009   TRIG 65.0 08/20/2008   Lab Results  Component Value Date   CHOLHDL 4 09/01/2010   CHOLHDL 4 08/26/2009   CHOLHDL 4 08/20/2008   No results found for this basename: LDLDIRECT

## 2010-09-08 NOTE — Assessment & Plan Note (Signed)
Discussed being seen for LLQ discomfort. Has seen Dr Jarold Motto.

## 2010-09-08 NOTE — Assessment & Plan Note (Signed)
Improved. Go to 2000/1000 as increase from 1000bid.

## 2010-09-08 NOTE — Assessment & Plan Note (Signed)
Stable. Cont to be careful with sweets and carbs.

## 2010-09-08 NOTE — Assessment & Plan Note (Signed)
Really complicates everything but hard for her to address because knee hurts when she goes out to walk for exercise.

## 2010-09-22 ENCOUNTER — Ambulatory Visit
Admission: RE | Admit: 2010-09-22 | Discharge: 2010-09-22 | Disposition: A | Discharge status: Home / Self Care | Payer: BC Managed Care – PPO | Source: Ambulatory Visit | Attending: Family Medicine | Admitting: Family Medicine

## 2010-09-22 DIAGNOSIS — Z0000 Encounter for general adult medical examination without abnormal findings: Secondary | ICD-10-CM

## 2010-11-17 ENCOUNTER — Other Ambulatory Visit: Payer: Self-pay | Admitting: Family Medicine

## 2011-01-08 ENCOUNTER — Other Ambulatory Visit: Payer: Self-pay | Admitting: Family Medicine

## 2011-04-20 ENCOUNTER — Ambulatory Visit (INDEPENDENT_AMBULATORY_CARE_PROVIDER_SITE_OTHER): Payer: BC Managed Care – PPO | Admitting: *Deleted

## 2011-04-20 DIAGNOSIS — Z23 Encounter for immunization: Secondary | ICD-10-CM

## 2011-06-30 ENCOUNTER — Other Ambulatory Visit: Payer: Self-pay | Admitting: Family Medicine

## 2011-06-30 DIAGNOSIS — Z1231 Encounter for screening mammogram for malignant neoplasm of breast: Secondary | ICD-10-CM

## 2011-09-02 ENCOUNTER — Encounter: Payer: Self-pay | Admitting: Family Medicine

## 2011-09-02 ENCOUNTER — Other Ambulatory Visit: Payer: Self-pay | Admitting: Family Medicine

## 2011-09-02 DIAGNOSIS — E559 Vitamin D deficiency, unspecified: Secondary | ICD-10-CM

## 2011-09-02 DIAGNOSIS — E7800 Pure hypercholesterolemia, unspecified: Secondary | ICD-10-CM

## 2011-09-02 DIAGNOSIS — I10 Essential (primary) hypertension: Secondary | ICD-10-CM

## 2011-09-07 ENCOUNTER — Other Ambulatory Visit (INDEPENDENT_AMBULATORY_CARE_PROVIDER_SITE_OTHER): Payer: BC Managed Care – PPO

## 2011-09-07 DIAGNOSIS — E7800 Pure hypercholesterolemia, unspecified: Secondary | ICD-10-CM

## 2011-09-07 DIAGNOSIS — I10 Essential (primary) hypertension: Secondary | ICD-10-CM

## 2011-09-07 DIAGNOSIS — E559 Vitamin D deficiency, unspecified: Secondary | ICD-10-CM

## 2011-09-07 LAB — LIPID PANEL
Cholesterol: 167 mg/dL (ref 0–200)
LDL Cholesterol: 100 mg/dL — ABNORMAL HIGH (ref 0–99)
Triglycerides: 79 mg/dL (ref 0.0–149.0)
VLDL: 15.8 mg/dL (ref 0.0–40.0)

## 2011-09-07 LAB — TSH: TSH: 3.48 u[IU]/mL (ref 0.35–5.50)

## 2011-09-07 LAB — BASIC METABOLIC PANEL
BUN: 11 mg/dL (ref 6–23)
Creatinine, Ser: 1 mg/dL (ref 0.4–1.2)
GFR: 74.44 mL/min (ref >60.00)

## 2011-09-14 ENCOUNTER — Encounter: Payer: BC Managed Care – PPO | Admitting: Family Medicine

## 2011-09-14 ENCOUNTER — Ambulatory Visit (INDEPENDENT_AMBULATORY_CARE_PROVIDER_SITE_OTHER): Payer: BC Managed Care – PPO | Admitting: Family Medicine

## 2011-09-14 ENCOUNTER — Encounter: Payer: Self-pay | Admitting: Family Medicine

## 2011-09-14 VITALS — BP 134/84 | HR 80 | Temp 97.8°F | Wt 306.2 lb

## 2011-09-14 DIAGNOSIS — M25511 Pain in right shoulder: Secondary | ICD-10-CM | POA: Insufficient documentation

## 2011-09-14 DIAGNOSIS — E7800 Pure hypercholesterolemia, unspecified: Secondary | ICD-10-CM

## 2011-09-14 DIAGNOSIS — E669 Obesity, unspecified: Secondary | ICD-10-CM

## 2011-09-14 DIAGNOSIS — R7309 Other abnormal glucose: Secondary | ICD-10-CM

## 2011-09-14 DIAGNOSIS — E559 Vitamin D deficiency, unspecified: Secondary | ICD-10-CM

## 2011-09-14 DIAGNOSIS — Z0000 Encounter for general adult medical examination without abnormal findings: Secondary | ICD-10-CM | POA: Insufficient documentation

## 2011-09-14 DIAGNOSIS — Z1211 Encounter for screening for malignant neoplasm of colon: Secondary | ICD-10-CM

## 2011-09-14 DIAGNOSIS — I10 Essential (primary) hypertension: Secondary | ICD-10-CM

## 2011-09-14 DIAGNOSIS — Z0001 Encounter for general adult medical examination with abnormal findings: Secondary | ICD-10-CM | POA: Insufficient documentation

## 2011-09-14 NOTE — Assessment & Plan Note (Signed)
Stable. rec restart vit D at 1000 IU daily.

## 2011-09-14 NOTE — Assessment & Plan Note (Signed)
Not really an issue when checked, will continue to monitor.

## 2011-09-14 NOTE — Progress Notes (Signed)
Subjective:    Patient ID: Leah Peterson, female    DOB: [birth year], 56 y.o.   MRN: [id number]  HPI CC: here for CPE today  Several month history of dark discoloration at right heel.  Tried aquaphor which did help.  R knee pain stable.  Seen by ortho last year, s/p cortisone shot.  R shoulder pain for last 6 mo, thinks aggravated by significant amt driving she does.  Pain located anterior shoulder, no radiation.  Aleve helps.  21 mo granddaughter at home.  Preventative: Colonoscopy 2000 by Dr. Jarold Motto with diverticulosis.  Screening discussed.  Requests ifob today. Mammogram scheduled for next week.  Normal last year. Pap smear - had 2 yrs ago, normal.  Will space out to q3 years, will be due next year 2014.  No irregular bleeding. LMP 10/2007.  No post menopausal sxs. Tetanus 2010.  Caffeine: 2-3 cups coffee, 2 cups sweet tea Lives with daughter and grand daughter.  No pets. 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  No regular exercise. Diet: fruits/vegetables daily, drinks a lot of sweet tea  Medications and allergies reviewed and updated in chart.  Past histories reviewed and updated if relevant as below. Patient Active Problem List  Diagnoses  . VITAMIN D DEFICIENCY  . HYPERCHOLESTEROLEMIA  . OBESITY  . HYPERTENSION, BENIGN ESSENTIAL  . DIVERTICULOSIS OF COLON  . IBS  . KNEE PAIN, LEFT  . HYPERGLYCEMIA   Past Medical History  Diagnosis Date  . Diverticulosis of colon (without mention of hemorrhage)   . Pure hypercholesterolemia   . Other abnormal glucose   . Essential hypertension, benign   . IBS (irritable bowel syndrome)   . Obesity, unspecified   . Unspecified vitamin D deficiency   . Left knee pain 2012    eval by ortho - bone spurs and DJD, treated with steroid shot which helped temporarily   Past Surgical History  Procedure Date  . Cesarean section     due to BP elevation  . Colonoscopy 2000   Diverticuli-Dr. Jarold Motto   History  Substance Use Topics  . Smoking status: Never Smoker   . Smokeless tobacco: Never Used  . Alcohol Use: No   Family History  Problem Relation Age of Onset  . Hypertension Father   . Cancer Father     prostate  . Heart disease Father     CABG x 2  . Cancer Mother     Lung-quit smoking 20 years prior to Dx  . Hypertension Mother   . Diabetes      + family hx  . Drug abuse Brother   . Stroke Maternal Grandmother   . Sleep apnea Brother     with [facility] estranged   Allergies  Allergen Reactions  . Lisinopril     REACTION: lethargy, cough.  . Penicillins     REACTION: LYMPH NODES SWELL   Current Outpatient Prescriptions on File Prior to Visit  Medication Sig Dispense Refill  . amLODipine (NORVASC) 5 MG tablet TAKE ONE TABLET BY MOUTH AT NIGHT  30 tablet  9  . cetirizine (ZYRTEC) 10 MG tablet Take 10 mg by mouth daily as needed.        . dicyclomine (BENTYL) 10 MG capsule Take 1 capsule (10 mg total) by mouth 4 (four) times daily as needed.  30 capsule  1  . naproxen sodium (ALEVE) 220 MG tablet Take 220 mg by mouth as needed.        . nystatin (  MYCOSTATIN) cream Apply 1 application topically 2 (two) times daily as needed.  30 g  3  . triamterene-hydrochlorothiazide (MAXZIDE-25) 37.5-25 MG per tablet TAKE ONE TABLET BY MOUTH IN THE MORNING  30 tablet  1  . cholecalciferol (VITAMIN D) 1000 UNITS tablet Take 1,000 Units by mouth daily.          Review of Systems  Constitutional: Negative for fever, chills, activity change, appetite change, fatigue and unexpected weight change.  HENT: Negative for hearing loss and neck pain.   Eyes: Negative for visual disturbance.  Respiratory: Negative for cough, chest tightness, shortness of breath and wheezing.   Cardiovascular: Negative for chest pain, palpitations and leg swelling.  Gastrointestinal: Negative for nausea, vomiting, abdominal pain, diarrhea, constipation, blood in stool and abdominal  distention.  Genitourinary: Negative for hematuria and difficulty urinating.  Musculoskeletal: Negative for myalgias and arthralgias.  Skin: Negative for rash.  Neurological: Negative for dizziness, seizures, syncope and headaches.  Hematological: Does not bruise/bleed easily.  Psychiatric/Behavioral: Negative for dysphoric mood. The patient is not nervous/anxious.        Objective:   Physical Exam  Nursing note and vitals reviewed. Constitutional: She is oriented to person, place, and time. She appears well-developed and well-nourished. No distress.  HENT:  Head: Normocephalic and atraumatic.  Right Ear: External ear normal.  Left Ear: External ear normal.  Nose: Nose normal.  Mouth/Throat: Oropharynx is clear and moist. No oropharyngeal exudate.  Eyes: Conjunctivae and EOM are normal. Pupils are equal, round, and reactive to light. No scleral icterus.  Neck: Normal range of motion. Neck supple.  Cardiovascular: Normal rate, regular rhythm, normal heart sounds and intact distal pulses.   No murmur heard. Pulses:      Radial pulses are 2+ on the right side, and 2+ on the left side.  Pulmonary/Chest: Effort normal and breath sounds normal. No respiratory distress. She has no wheezes. She has no rales. Right breast exhibits no inverted nipple, no mass, no nipple discharge, no skin change and no tenderness. Left breast exhibits no inverted nipple, no mass, no nipple discharge, no skin change and no tenderness. Breasts are symmetrical.  Abdominal: Soft. Bowel sounds are normal. She exhibits no distension and no mass. There is no tenderness. There is no rebound and no guarding.  Musculoskeletal: Normal range of motion. She exhibits no edema.       No pain to palpation at shoulder. No deformity noted. FROM at shoulders Tender with empty can sign.   No pain at bicipital groove No AC tenderness.  Lymphadenopathy:    She has no cervical adenopathy.  Neurological: She is alert and oriented  to person, place, and time.       CN grossly intact, station and gait intact  Skin: Skin is warm and dry. No rash noted.       Dry skin - some hyperkeratotic - posterior heels and medial midfoot R>L.  Psychiatric: She has a normal mood and affect. Her behavior is normal. Judgment and thought content normal.       Assessment & Plan:

## 2011-09-14 NOTE — Assessment & Plan Note (Signed)
Preventative protocols reviewed and updated unless pt declined. Updated chart. ifob per pt request. Will space paps to q3 yrs, due in 2014 (next year). Has mammogram appt next week. Discussed healthy diet and living. Reviewed blood work with pt in detail.

## 2011-09-14 NOTE — Assessment & Plan Note (Signed)
Anticipate RTC strain - treat with aleve, ice, rest, and stretching exercises from SM pt advisor provided today. If not improving or worsening, to return for further evaluation.

## 2011-09-14 NOTE — Assessment & Plan Note (Signed)
Stable. Continue meds. BP Readings from Last 3 Encounters:  09/14/11 134/84  09/08/10 128/80  02/10/10 148/100

## 2011-09-14 NOTE — Assessment & Plan Note (Signed)
Discussed dietary and lifestyle changes to achieve weight loss. Pt will try and increase regular walking.

## 2011-09-14 NOTE — Assessment & Plan Note (Signed)
Longstanding.  Discussed this and concern for prediabetes. Will check A1c. No results found for this basename: HGBA1C  discussed lower sugar, sweets, lower carb diet. Pt will start diluting sweet tea.

## 2011-09-14 NOTE — Patient Instructions (Addendum)
Look into increasing walking.  Goal is to incorporate exercise into routine. Start vitamin D back up again at 1000 IU daily should be sufficient. Your sugar is high! Concern for prediabetes - recommend backing off sweet tea.  We will need to keep an eye on this. I'd like you to incorporate more activity into your routine - consider increasing walking. Good to see you today, call us with questions. Mammogram scheduled for next week. I think you have irritated rotator cuff - try aleve regularly for next several days, ice/heat to shoulder (whichever soothes shoulder better), and stretching exercises provided.

## 2011-09-28 ENCOUNTER — Ambulatory Visit
Admission: RE | Admit: 2011-09-28 | Discharge: 2011-09-28 | Disposition: A | Discharge status: Home / Self Care | Payer: BC Managed Care – PPO | Source: Ambulatory Visit | Attending: Family Medicine | Admitting: Family Medicine

## 2011-09-28 ENCOUNTER — Encounter: Payer: Self-pay | Admitting: *Deleted

## 2011-09-28 DIAGNOSIS — Z1231 Encounter for screening mammogram for malignant neoplasm of breast: Secondary | ICD-10-CM

## 2011-10-04 ENCOUNTER — Other Ambulatory Visit: Payer: BC Managed Care – PPO

## 2011-10-04 ENCOUNTER — Encounter: Payer: Self-pay | Admitting: *Deleted

## 2011-10-04 DIAGNOSIS — Z1211 Encounter for screening for malignant neoplasm of colon: Secondary | ICD-10-CM

## 2011-12-15 ENCOUNTER — Other Ambulatory Visit: Payer: Self-pay | Admitting: Family Medicine

## 2012-03-20 ENCOUNTER — Other Ambulatory Visit: Payer: Self-pay | Admitting: Family Medicine

## 2012-03-20 DIAGNOSIS — Z1231 Encounter for screening mammogram for malignant neoplasm of breast: Secondary | ICD-10-CM

## 2012-09-09 ENCOUNTER — Other Ambulatory Visit: Payer: Self-pay | Admitting: Family Medicine

## 2012-09-09 DIAGNOSIS — E559 Vitamin D deficiency, unspecified: Secondary | ICD-10-CM

## 2012-09-09 DIAGNOSIS — R7309 Other abnormal glucose: Secondary | ICD-10-CM

## 2012-09-09 DIAGNOSIS — I10 Essential (primary) hypertension: Secondary | ICD-10-CM

## 2012-09-12 ENCOUNTER — Other Ambulatory Visit (INDEPENDENT_AMBULATORY_CARE_PROVIDER_SITE_OTHER): Payer: BC Managed Care – PPO

## 2012-09-12 DIAGNOSIS — R7309 Other abnormal glucose: Secondary | ICD-10-CM

## 2012-09-12 DIAGNOSIS — E559 Vitamin D deficiency, unspecified: Secondary | ICD-10-CM

## 2012-09-12 DIAGNOSIS — I10 Essential (primary) hypertension: Secondary | ICD-10-CM

## 2012-09-12 LAB — BASIC METABOLIC PANEL
BUN: 10 mg/dL (ref 6–23)
Chloride: 102 mEq/L (ref 96–112)
Creatinine, Ser: 1 mg/dL (ref 0.4–1.2)
GFR: 75.9 mL/min (ref >60.00)

## 2012-09-12 LAB — HEMOGLOBIN A1C: Hgb A1c MFr Bld: 6.6 % — ABNORMAL HIGH (ref 4.6–6.5)

## 2012-09-19 ENCOUNTER — Other Ambulatory Visit (HOSPITAL_COMMUNITY)
Admission: RE | Admit: 2012-09-19 | Discharge: 2012-09-19 | Disposition: A | Discharge status: Home / Self Care | Payer: BC Managed Care – PPO | Source: Ambulatory Visit | Attending: Family Medicine | Admitting: Family Medicine

## 2012-09-19 ENCOUNTER — Encounter: Payer: Self-pay | Admitting: Family Medicine

## 2012-09-19 ENCOUNTER — Ambulatory Visit (INDEPENDENT_AMBULATORY_CARE_PROVIDER_SITE_OTHER): Payer: BC Managed Care – PPO | Admitting: Family Medicine

## 2012-09-19 VITALS — BP 148/92 | HR 64 | Temp 98.3°F | Ht 62.0 in | Wt 320.5 lb

## 2012-09-19 DIAGNOSIS — Z1211 Encounter for screening for malignant neoplasm of colon: Secondary | ICD-10-CM

## 2012-09-19 DIAGNOSIS — E559 Vitamin D deficiency, unspecified: Secondary | ICD-10-CM

## 2012-09-19 DIAGNOSIS — E119 Type 2 diabetes mellitus without complications: Secondary | ICD-10-CM

## 2012-09-19 DIAGNOSIS — I10 Essential (primary) hypertension: Secondary | ICD-10-CM

## 2012-09-19 DIAGNOSIS — Z0000 Encounter for general adult medical examination without abnormal findings: Secondary | ICD-10-CM

## 2012-09-19 DIAGNOSIS — Z01419 Encounter for gynecological examination (general) (routine) without abnormal findings: Secondary | ICD-10-CM | POA: Insufficient documentation

## 2012-09-19 DIAGNOSIS — E785 Hyperlipidemia, unspecified: Secondary | ICD-10-CM

## 2012-09-19 DIAGNOSIS — Z1151 Encounter for screening for human papillomavirus (HPV): Secondary | ICD-10-CM | POA: Insufficient documentation

## 2012-09-19 MED ORDER — DICYCLOMINE HCL 10 MG PO CAPS: 10.0000 mg | ORAL_CAPSULE | Freq: Four times a day (QID) | ORAL | Status: DC | PRN

## 2012-09-19 NOTE — Assessment & Plan Note (Signed)
Discussed weight concerns with new dx diabetes and deteriorated HTN. Suggested goal of 15-30 lbs over the next year. Discussed lifestyle and diet changes to achieve this change.

## 2012-09-19 NOTE — Assessment & Plan Note (Signed)
New. A1c in diabetes range today, but controlled. Discussed importance of weight loss and avoiding sugars.

## 2012-09-19 NOTE — Assessment & Plan Note (Signed)
Stable on vit D replacement.

## 2012-09-19 NOTE — Progress Notes (Signed)
Subjective:    Patient ID: Leah Peterson, female    DOB: May 23, 1955, 57 y.o.   MRN: [id number]  HPI CC: CPE  HTN - occasionally checks at home.  Thinks running well at home.    Elevated sugar - see assessment/plan section  Preventative:  Colonoscopy 2000 by Dr. Jarold Motto with diverticulosis. Screening discussed. Requests ifob today. Last year normal. Mammogram 08/2011 WNL, scheduled for next month.  Pap smear - last was 2011 normal. Will space out to q3 years, will be due next year 2014. No irregular bleeding. LMP 10/2007. No post menopausal sxs. All normal in past. Tetanus 2010.  Flu shot tries to get yearly.  Caffeine: 2-3 cups coffee, 2 cups sweet tea  Lives with daughter and grand daughter. No pets.  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 No regular exercise. Thinks may start walking Diet: good water, fruits/vegetables daily, drinks a lot of sweet tea  Wt Readings from Last 3 Encounters:  09/19/12 320 lb 8 oz (145.378 kg)  09/14/11 306 lb 4 oz (138.914 kg)  09/08/10 318 lb 12 oz (144.584 kg)  Body mass index is 58.61 kg/(m^2).   Medications and allergies reviewed and updated in chart.  Past histories reviewed and updated if relevant as below. Patient Active Problem List   Diagnosis Date Noted  . Healthcare maintenance 09/14/2011  . Right shoulder pain 09/14/2011  . VITAMIN D DEFICIENCY 09/02/2009  . KNEE PAIN, LEFT 09/02/2009  . HYPERGLYCEMIA 12/31/2008  . DIVERTICULOSIS OF COLON 08/27/2008  . IBS 04/02/2008  . HYPERTENSION, BENIGN ESSENTIAL 06/18/2007  . Mild hyperlipidemia 06/12/2007  . OBESITY 06/11/2007   Past Medical History  Diagnosis Date  . Diverticulosis of colon (without mention of hemorrhage)   . Pure hypercholesterolemia   . Other abnormal glucose   . Essential hypertension, benign   . IBS (irritable bowel syndrome)   . Obesity, unspecified   . Unspecified vitamin D deficiency   . Left knee pain 2012    eval by ortho - bone spurs and DJD, treated with steroid shot which helped temporarily   Past Surgical History  Procedure Laterality Date  . Cesarean section      due to BP elevation  . Colonoscopy  2000    Diverticuli-Dr. Jarold Motto   History  Substance Use Topics  . Smoking status: Never Smoker   . Smokeless tobacco: Never Used  . Alcohol Use: Yes     Comment: wine on occasion   Family History  Problem Relation Age of Onset  . Hypertension Father   . Cancer Father     prostate  . Heart disease Father     CABG x 2  . Cancer Mother     Lung-quit smoking 20 years prior to Dx  . Hypertension Mother   . Diabetes      + family hx  . Drug abuse Brother   . Stroke Maternal Grandmother   . Sleep apnea Brother     with [facility] estranged   Allergies  Allergen Reactions  . Lisinopril     REACTION: lethargy, cough.  . Penicillins     REACTION: LYMPH NODES SWELL   Current Outpatient Prescriptions on File Prior to Visit  Medication Sig Dispense Refill  . amLODipine (NORVASC) 5 MG tablet TAKE ONE TABLET BY MOUTH EVERY DAY AT NIGHT  30 tablet  11  . cetirizine (ZYRTEC) 10 MG tablet Take 10 mg by mouth daily as needed.        Marland Kitchen  cholecalciferol (VITAMIN D) 1000 UNITS tablet Take 1,000 Units by mouth daily.       . naproxen sodium (ALEVE) 220 MG tablet Take 220 mg by mouth as needed.        . nystatin (MYCOSTATIN) cream Apply 1 application topically 2 (two) times daily as needed.  30 g  3  . triamterene-hydrochlorothiazide (MAXZIDE-25) 37.5-25 MG per tablet TAKE ONE TABLET BY MOUTH IN THE MORNING  30 tablet  11   No current facility-administered medications on file prior to visit.     Review of Systems  Constitutional: Negative for fever, chills, activity change, appetite change, fatigue and unexpected weight change.  HENT: Negative for hearing loss and neck pain.   Eyes: Negative for visual disturbance.  Respiratory: Negative for cough, chest tightness, shortness of breath and  wheezing.   Cardiovascular: Positive for leg swelling. Negative for chest pain and palpitations.  Gastrointestinal: Negative for nausea, vomiting, abdominal pain, diarrhea, constipation, blood in stool and abdominal distention.  Genitourinary: Negative for hematuria and difficulty urinating.  Musculoskeletal: Negative for myalgias and arthralgias.  Skin: Negative for rash.  Neurological: Negative for dizziness, seizures, syncope and headaches.  Hematological: Negative for adenopathy. Does not bruise/bleed easily.  Psychiatric/Behavioral: Negative for dysphoric mood. The patient is not nervous/anxious.        Objective:   Physical Exam  Nursing note and vitals reviewed. Constitutional: She is oriented to person, place, and time. She appears well-developed and well-nourished. No distress.  Morbidly obese  HENT:  Head: Normocephalic and atraumatic.  Right Ear: External ear normal.  Left Ear: External ear normal.  Nose: Nose normal.  Mouth/Throat: Oropharynx is clear and moist. No oropharyngeal exudate.  Eyes: Conjunctivae and EOM are normal. Pupils are equal, round, and reactive to light. No scleral icterus.  Neck: Normal range of motion. Neck supple. Carotid bruit is not present. No thyromegaly present.  Cardiovascular: Normal rate, regular rhythm, normal heart sounds and intact distal pulses.   No murmur heard. Pulses:      Radial pulses are 2+ on the right side, and 2+ on the left side.  Pulmonary/Chest: Effort normal and breath sounds normal. No respiratory distress. She has no wheezes. She has no rales. Right breast exhibits no inverted nipple, no mass, no nipple discharge, no skin change and no tenderness. Left breast exhibits no inverted nipple, no mass, no nipple discharge, no skin change and no tenderness.  Abdominal: Soft. Bowel sounds are normal. She exhibits no distension and no mass. There is no tenderness. There is no rebound and no guarding.  Genitourinary: Vagina normal  and uterus normal. Pelvic exam was performed with patient supine. There is no rash, tenderness, lesion or injury on the right labia. There is no rash, tenderness, lesion or injury on the left labia. Right adnexum displays no mass, no tenderness and no fullness. Left adnexum displays no mass, no tenderness and no fullness.  Trouble visualizing cervix for pap smear  Musculoskeletal: Normal range of motion. She exhibits edema (nonpitting lymphedema bilaterally).  Diabetic foot exam: Normal inspection - foot edema present, 1+ Maceration between R 4th/5th digits No calluses  Normal DP/PT pulses Normal sensation to light touch and monofilament Nails normal  Lymphadenopathy:       Head (right side): No submental, no submandibular, no tonsillar, no preauricular, no posterior auricular and no occipital adenopathy present.       Head (left side): No submental, no submandibular, no tonsillar, no preauricular, no posterior auricular and no occipital adenopathy present.  She has no cervical adenopathy.    She has no axillary adenopathy.       Right axillary: No lateral adenopathy present.       Left axillary: No lateral adenopathy present.      Right: No inguinal and no supraclavicular adenopathy present.       Left: No inguinal and no supraclavicular adenopathy present.  Neurological: She is alert and oriented to person, place, and time.  CN grossly intact, station and gait intact  Skin: Skin is warm and dry. No rash noted.  Psychiatric: She has a normal mood and affect. Her behavior is normal. Judgment and thought content normal.      Assessment & Plan:

## 2012-09-19 NOTE — Assessment & Plan Note (Signed)
Chronic, slightly elevated today however pt endorses good control at home. I've asked her to monitor closely at home and notify me if consistently elevated >140/90 for further med titration Consider ARB given new dx DM.

## 2012-09-19 NOTE — Assessment & Plan Note (Signed)
Preventative protocols reviewed and updated unless pt declined. Discussed healthy diet and lifestyle. Breast exam today. Pelvic exam today - pap performed, but difficulty visualizing cervix.

## 2012-09-19 NOTE — Patient Instructions (Addendum)
Let's keep an eye on blood pressure - if consistently >140/90, call me and we will change meds. If you're having persistent left lower abdominal pain, with fever or bowel changes, let me know. Pass by lab for stool kit today. Appointment for mammogram next month. Good to see you today, call us with questions.

## 2012-09-19 NOTE — Assessment & Plan Note (Signed)
Minimal last check - did not repeat this year.

## 2012-10-03 ENCOUNTER — Ambulatory Visit
Admission: RE | Admit: 2012-10-03 | Discharge: 2012-10-03 | Disposition: A | Discharge status: Home / Self Care | Payer: BC Managed Care – PPO | Source: Ambulatory Visit | Attending: Family Medicine | Admitting: Family Medicine

## 2012-10-03 DIAGNOSIS — Z1231 Encounter for screening mammogram for malignant neoplasm of breast: Secondary | ICD-10-CM

## 2012-10-04 ENCOUNTER — Encounter: Payer: Self-pay | Admitting: *Deleted

## 2012-10-04 ENCOUNTER — Encounter: Payer: Self-pay | Admitting: Family Medicine

## 2012-11-08 ENCOUNTER — Other Ambulatory Visit: Payer: Self-pay

## 2012-11-19 ENCOUNTER — Other Ambulatory Visit: Payer: BC Managed Care – PPO

## 2012-11-19 DIAGNOSIS — Z1211 Encounter for screening for malignant neoplasm of colon: Secondary | ICD-10-CM

## 2012-11-20 ENCOUNTER — Encounter: Payer: Self-pay | Admitting: *Deleted

## 2013-01-01 ENCOUNTER — Other Ambulatory Visit: Payer: Self-pay | Admitting: Family Medicine

## 2013-01-01 ENCOUNTER — Other Ambulatory Visit: Payer: Self-pay | Admitting: *Deleted

## 2013-01-01 MED ORDER — AMLODIPINE BESYLATE 5 MG PO TABS: ORAL_TABLET | ORAL | Status: DC

## 2013-02-08 ENCOUNTER — Telehealth: Payer: Self-pay | Admitting: *Deleted

## 2013-02-08 ENCOUNTER — Ambulatory Visit (INDEPENDENT_AMBULATORY_CARE_PROVIDER_SITE_OTHER): Payer: BC Managed Care – PPO | Admitting: Family Medicine

## 2013-02-08 ENCOUNTER — Encounter: Payer: Self-pay | Admitting: Family Medicine

## 2013-02-08 VITALS — BP 144/100 | HR 104 | Temp 98.6°F | Wt 311.0 lb

## 2013-02-08 DIAGNOSIS — R1032 Left lower quadrant pain: Secondary | ICD-10-CM | POA: Insufficient documentation

## 2013-02-08 DIAGNOSIS — K625 Hemorrhage of anus and rectum: Secondary | ICD-10-CM | POA: Insufficient documentation

## 2013-02-08 LAB — CBC WITH DIFFERENTIAL/PLATELET
Basophils Absolute: 0.1 10*3/uL (ref 0.0–0.1)
Eosinophils Relative: 0.4 % (ref 0.0–5.0)
HCT: 35.4 % — ABNORMAL LOW (ref 36.0–46.0)
Lymphocytes Relative: 23 % (ref 12.0–46.0)
Monocytes Relative: 7.4 % (ref 3.0–12.0)
Neutrophils Relative %: 68.7 % (ref 43.0–77.0)
Platelets: 236 10*3/uL (ref 150.0–400.0)
WBC: 9.7 10*3/uL (ref 4.5–10.5)

## 2013-02-08 LAB — HEMOCCULT GUIAC POC 1CARD (OFFICE): Fecal Occult Blood, POC: POSITIVE

## 2013-02-08 LAB — COMPREHENSIVE METABOLIC PANEL
CO2: 33 mEq/L — ABNORMAL HIGH (ref 19–32)
Calcium: 8.7 mg/dL (ref 8.4–10.5)
Chloride: 98 mEq/L (ref 96–112)
GFR: 73.19 mL/min (ref >60.00)
Glucose, Bld: 115 mg/dL — ABNORMAL HIGH (ref 70–99)
Sodium: 139 mEq/L (ref 135–145)
Total Bilirubin: 0.6 mg/dL (ref 0.3–1.2)
Total Protein: 7.7 g/dL (ref 6.0–8.3)

## 2013-02-08 LAB — LIPASE: Lipase: 27 U/L (ref 11.0–59.0)

## 2013-02-08 MED ORDER — METRONIDAZOLE 500 MG PO TABS: 500.0000 mg | ORAL_TABLET | Freq: Three times a day (TID) | ORAL | Status: DC

## 2013-02-08 MED ORDER — CIPROFLOXACIN HCL 500 MG PO TABS: 500.0000 mg | ORAL_TABLET | Freq: Two times a day (BID) | ORAL | Status: DC

## 2013-02-08 NOTE — Telephone Encounter (Signed)
Seen today. 

## 2013-02-08 NOTE — Patient Instructions (Addendum)
I think you have diverticulitis. Stat blood work today - we will call you with results. Start cipro twice daily for 10 days. Start flagyl three times daily for 10 days. Please see ER if any worsening abdominal pain, persistent bleeding, or any shortness of breath or dizziness. Clear liquid diet for next 2 days then slowly advance diet. If not improving into next week, call me for CT scan.  Diverticulitis A diverticulum is a small pouch or sac on the colon. Diverticulosis is the presence of these diverticula on the colon. Diverticulitis is the irritation (inflammation) or infection of diverticula. CAUSES  The colon and its diverticula contain bacteria. If food particles block the tiny opening to a diverticulum, the bacteria inside can grow and cause an increase in pressure. This leads to infection and inflammation and is called diverticulitis. SYMPTOMS   Abdominal pain and tenderness. Usually, the pain is located on the left side of your abdomen. However, it could be located elsewhere.  Fever.  Bloating.  Feeling sick to your stomach (nausea).  Throwing up (vomiting).  Abnormal stools. DIAGNOSIS  Your caregiver will take a history and perform a physical exam. Since many things can cause abdominal pain, other tests may be necessary. Tests may include:  Blood tests.  Urine tests.  X-ray of the abdomen.  CT scan of the abdomen. Sometimes, surgery is needed to determine if diverticulitis or other conditions are causing your symptoms. TREATMENT  Most of the time, you can be treated without surgery. Treatment includes:  Resting the bowels by only having liquids for a few days. As you improve, you will need to eat a low-fiber diet.  Intravenous (IV) fluids if you are losing body fluids (dehydrated).  Antibiotic medicines that treat infections may be given.  Pain and nausea medicine, if needed.  Surgery if the inflamed diverticulum has burst. HOME CARE INSTRUCTIONS   Try a  clear liquid diet (broth, tea, or water for as long as directed by your caregiver). You may then gradually begin a low-fiber diet as tolerated.  A low-fiber diet is a diet with less than 10 grams of fiber. Choose the foods below to reduce fiber in the diet:  White breads, cereals, rice, and pasta.  Cooked fruits and vegetables or soft fresh fruits and vegetables without the skin.  Ground or well-cooked tender beef, ham, veal, lamb, pork, or poultry.  Eggs and seafood.  After your diverticulitis symptoms have improved, your caregiver may put you on a high-fiber diet. A high-fiber diet includes 14 grams of fiber for every 1000 calories consumed. For a standard 2000 calorie diet, you would need 28 grams of fiber. Follow these diet guidelines to help you increase the fiber in your diet. It is important to slowly increase the amount fiber in your diet to avoid gas, constipation, and bloating.  Choose whole-grain breads, cereals, pasta, and brown rice.  Choose fresh fruits and vegetables with the skin on. Do not overcook vegetables because the more vegetables are cooked, the more fiber is lost.  Choose more nuts, seeds, legumes, dried peas, beans, and lentils.  Look for food products that have greater than 3 grams of fiber per serving on the Nutrition Facts label.  Take all medicine as directed by your caregiver.  If your caregiver has given you a follow-up appointment, it is very important that you go. Not going could result in lasting (chronic) or permanent injury, pain, and disability. If there is any problem keeping the appointment, call to reschedule. SEEK  MEDICAL CARE IF:   Your pain does not improve.  You have a hard time advancing your diet beyond clear liquids.  Your bowel movements do not return to normal. SEEK IMMEDIATE MEDICAL CARE IF:   Your pain becomes worse.  You have an oral temperature above 102 F (38.9 C), not controlled by medicine.  You have repeated  vomiting.  You have bloody or black, tarry stools.  Symptoms that brought you to your caregiver become worse or are not getting better. MAKE SURE YOU:   Understand these instructions.  Will watch your condition.  Will get help right away if you are not doing well or get worse. Document Released: 01/26/2005 Document Revised: 07/11/2011 Document Reviewed: 05/24/2010 [facility] Patient Information 2014 [place], [place].

## 2013-02-08 NOTE — Telephone Encounter (Signed)
Pt walked in c/o small specks of bright red blood in her stool (loose stools, bm's 4-5x daily), slight abd pain, and decreased appetite. She states it started 01/29/13 after taking amlodipine on an empty stomach. She has diverticulitis and takes dicyclomine 10 mg prn LLQ abd cramping. Last dose was 01/31/13. Denies fever, or black tarry stools. I spoke with Dr. Reece Agar and he will see her @ 12:45 today. Appt scheduled for pt, she chose to leave office and return for appt.

## 2013-02-08 NOTE — Progress Notes (Signed)
  Subjective:    Patient ID: Leah Peterson, female    DOB: 03-01-1956, 57 y.o.   MRN: [id number]  HPI CC: rectal bleeding, abd pain  2 wks ago took meds on an empty stomach - stomach cramping woke her up.  Since then, having LLQ pain/cramping worse with solid intake.  Has also been having some liquid stool accidents - attributes to bentyl.  Last night noted BRB mixed in soft stool and with wiping.  No active bleeding rectally, no black tarry stool.  Last BM was this morning - some bleeding noted as well.  Appetite decreased.  Some night sweats over last 2 weeks. Denies fevers/chills, nausea/vomiting.  No dyspnea, dizziness, headaches.  Has been treating with bentyl. No aleve in last 2 week.  Colonoscopy 2000 by Dr. Jarold Motto with diverticulosis. iFOB normal (10/2012). Has had diverticulitis in past that also presented with bleed.  Wt Readings from Last 3 Encounters:  02/08/13 311 lb (141.069 kg)  09/19/12 320 lb 8 oz (145.378 kg)  09/14/11 306 lb 4 oz (138.914 kg)   BP Readings from Last 3 Encounters:  02/08/13 144/100  09/19/12 148/92  09/14/11 134/84   Past Medical History  Diagnosis Date  . Diverticulosis of colon (without mention of hemorrhage)   . Pure hypercholesterolemia   . Other abnormal glucose   . Essential hypertension, benign   . IBS (irritable bowel syndrome)   . Obesity, unspecified   . Unspecified vitamin D deficiency   . Left knee pain 2012    eval by ortho - bone spurs and DJD, treated with steroid shot which helped temporarily    Past Surgical History  Procedure Laterality Date  . Cesarean section      due to BP elevation  . Colonoscopy  2000    Diverticuli-Dr. Jarold Motto   Review of Systems Per HPI    Objective:   Physical Exam  Nursing note and vitals reviewed. Constitutional: She appears well-developed and well-nourished. No distress.  HENT:  Mouth/Throat: Oropharynx is clear and moist. No oropharyngeal exudate.  Cardiovascular: Regular  rhythm, normal heart sounds and intact distal pulses.  Tachycardia present.   No murmur heard. Mildly tachycardic  Pulmonary/Chest: Effort normal and breath sounds normal. No respiratory distress. She has no wheezes. She has no rales.  Abdominal: Soft. Bowel sounds are normal. She exhibits no distension and no mass. There is no hepatosplenomegaly. There is tenderness in the left lower quadrant. There is no rigidity, no rebound, no guarding, no CVA tenderness and negative Murphy's sign.  obese  Musculoskeletal: She exhibits edema (nonpitting).  Skin: Skin is warm and dry.  Psychiatric: She has a normal mood and affect.       Assessment & Plan:

## 2013-02-08 NOTE — Assessment & Plan Note (Addendum)
LLQ pain with BM change and bleeding in setting of diverticulosis by colonoscopy and prior diverticulitis that presented similarly - anticipate diverticulitis. Check stat blood work today (CBC, CMP, lipase).  Stool card in office positive for blood today. Treat with cipro/flagyl 10d course. If worsening, if persistent bleed, discussed to ER. If not improving into next week, low threshold to obtain CT scan abdomen.

## 2013-02-08 NOTE — Assessment & Plan Note (Addendum)
Nontoxic, vitals stable, no sxs of acute blood loss anemia. Check CBC stat. Discussed option of CT scan today vs give abx time - pt opts to wait. Discussed red flags that would make her go to ER.  Pt agrees.

## 2013-02-13 ENCOUNTER — Ambulatory Visit (INDEPENDENT_AMBULATORY_CARE_PROVIDER_SITE_OTHER): Payer: BC Managed Care – PPO | Admitting: Family Medicine

## 2013-02-13 ENCOUNTER — Telehealth: Payer: Self-pay | Admitting: Family Medicine

## 2013-02-13 ENCOUNTER — Encounter: Payer: Self-pay | Admitting: Family Medicine

## 2013-02-13 VITALS — BP 146/80 | HR 88 | Temp 98.1°F | Wt 310.5 lb

## 2013-02-13 DIAGNOSIS — R300 Dysuria: Secondary | ICD-10-CM

## 2013-02-13 LAB — POCT URINALYSIS DIPSTICK
Bilirubin, UA: NEGATIVE
Ketones, UA: NEGATIVE
Spec Grav, UA: 1.02
pH, UA: 5

## 2013-02-13 NOTE — Telephone Encounter (Signed)
Plz schedule appt with someone in office today or with myself early tomorrow.

## 2013-02-13 NOTE — Telephone Encounter (Signed)
Will see today.  

## 2013-02-13 NOTE — Patient Instructions (Signed)
Drink plenty of water and fluids that don't seem to increase the irritation.  Finish the antibiotics.  We'll contact you with your lab report.  Take care.

## 2013-02-13 NOTE — Progress Notes (Signed)
The LLQ pain is better but not fully resolved. She has BMs w/o blood now.  No fevers.  On cipro and flagyl.    Now with burning with urination, with the last few drops of urine.  No pain with the first 90% of urine.  She has cut out soda and tea and that helped some.  No yeast like discharge.  No fevers.  No back pain.    Meds, vitals, and allergies reviewed.   ROS: See HPI.  Otherwise, noncontributory.  nad ncat Mmm rrr Ctab abd not ttp Ext well perfused.

## 2013-02-13 NOTE — Telephone Encounter (Signed)
Scheduled with Dr. Para March today. Forwarded message to him for FYI.

## 2013-02-13 NOTE — Telephone Encounter (Signed)
[facility] Triage Call Report Triage Record Num: [id number] Operator: Remonia Richter Patient Name: Leah Peterson Call Date & Time: 02/12/2013 5:56:02PM Patient Phone: [phone] PCP: Eustaquio Boyden Patient Gender: Female PCP Fax : [phone] Patient DOB: [birth year] Practice Name: [facility] Reason for Call: Caller: Lakara/Patient; PCP: Eustaquio Boyden (Family Practice); CB#: [phone]; Call regarding Pain with urination that started 02/09/13 after starting new antibiotics for Diverticulitis , on Cipro and Flagyl since 02/08/13 , afebrile,Menopausal, denies emegrent symptoms per Urinary guideline, See Provider in 24 hr disposition obtained due to one or more symptoms, she will call office in am to ask if eval needed or just a urine specimen,Tylenol advised per OTC directions, Home care advice given,callback parameters gone over Protocol(s) Used: Urinary Symptoms - Female Recommended Outcome per Protocol: See Provider within 24 hours Reason for Outcome: Has one or more urinary tract symptoms AND has not been previously evaluated Care Advice: ~ Call provider if you develop flank or low back pain, fever, generally feel sick. ~ SYMPTOM / CONDITION MANAGEMENT ~ CAUTIONS Limit carbonated, alcoholic, and caffeinated beverages such as coffee, tea and soda. Avoid nonprescription cold and allergy medications that contain caffeine. Limit intake of tomatoes, fruit juices (except for unsweetened cranberry juice), dairy products, spicy foods, sugar, and artificial sweeteners (aspartame or saccharine). Stop or decrease smoking. Reducing exposure to bladder irritants may help lessen urgency. ~ Analgesic/Antipyretic Advice - Acetaminophen: Consider acetaminophen as directed on label or by pharmacist/provider for pain or fever PRECAUTIONS: - Use if there is no history of liver disease, alcoholism, or intake of three or more alcohol drinks per day - Only if approved by  provider during pregnancy or when breastfeeding - During pregnancy, acetaminophen should not be taken more than 3 consecutive days without telling provider - Do not exceed recommended dose or frequency ~ Systemic Inflammatory Response Syndrome (SIRS): Watch for signs of a generalized, whole body infection. Occurs within days of a localized infection, especially of the urinary, GI, respiratory or nervous systems; or after a traumatic injury or invasive procedure. - Call EMS 911 if symptoms have worsened, such as increasing confusion or unusual drowsiness; cold and clammy skin; no urine output; rapid respiration (>30/min.) or slow respiration (<10/min.); struggling to breathe. - Go to the ED immediately for early symptoms of rapid pulse >90/min. or rapid breathing >20/min. at rest; chills; oral temperature >100.4 F (38 C) or <96.8 F (36 C) when associated with conditions noted. ~ Analgesic/Antipyretic Advice - NSAIDs: Consider aspirin, ibuprofen, naproxen or ketoprofen for pain or fever as directed on label or by pharmacist/provider. PRECAUTIONS: - If over 6 years of age, should not take longer than 1 week without consulting provider. EXCEPTIONS: - Should not be used if taking blood thinners or have bleeding problems. - Do not use if have history of sensitivity/allergy to any of these medications; or history of cardiovascular, ulcer, kidney, liver disease or diabetes unless approved by provider. - Do not exceed recommended dose or frequency. ~ 02/12/2013 6:05:25PM Page 1 of 2 [email or web address] [facility] Triage Call Report Patient Name: Vieva Brummitt continuation page/s Increase intake of fluids. Try to drink 8 oz. (.2 liter) every hour when awake, unless on restricted fluids for other medical reasons. Include at least two 8 oz. (.2 liter) glasses of unsweetened cranberry juice each day. Take sips of fluid or eat ice chips if nauseated or vomiting. ~ 02/12/2013 6:05:25PM Page 2 of 2  [email or web address]

## 2013-02-14 ENCOUNTER — Other Ambulatory Visit: Payer: Self-pay | Admitting: *Deleted

## 2013-02-14 MED ORDER — TRIAMTERENE-HCTZ 37.5-25 MG PO TABS: ORAL_TABLET | ORAL | Status: DC

## 2013-02-15 DIAGNOSIS — R300 Dysuria: Secondary | ICD-10-CM | POA: Insufficient documentation

## 2013-02-15 NOTE — Assessment & Plan Note (Signed)
Irritation vs resistant uti?  Check ucx and notify pt.  Continue with inc in PO fluids in meantime. She agrees. Finish abx as prev prescribed.

## 2013-02-18 ENCOUNTER — Encounter: Payer: Self-pay | Admitting: Family Medicine

## 2013-03-07 ENCOUNTER — Other Ambulatory Visit: Payer: Self-pay

## 2013-04-02 ENCOUNTER — Ambulatory Visit (INDEPENDENT_AMBULATORY_CARE_PROVIDER_SITE_OTHER): Payer: BC Managed Care – PPO

## 2013-04-02 ENCOUNTER — Ambulatory Visit: Payer: BC Managed Care – PPO

## 2013-04-02 DIAGNOSIS — Z23 Encounter for immunization: Secondary | ICD-10-CM

## 2013-05-02 DIAGNOSIS — I82409 Acute embolism and thrombosis of unspecified deep veins of unspecified lower extremity: Secondary | ICD-10-CM

## 2013-05-02 DIAGNOSIS — K750 Abscess of liver: Secondary | ICD-10-CM

## 2013-05-02 HISTORY — PX: DRAINAGE ABD/PERITON ABS PERC (ARMC HX): HXRAD1700

## 2013-05-02 HISTORY — PX: OTHER SURGICAL HISTORY: SHX169

## 2013-05-02 HISTORY — DX: Acute embolism and thrombosis of unspecified deep veins of unspecified lower extremity: I82.409

## 2013-05-02 HISTORY — DX: Abscess of liver: K75.0

## 2013-07-31 ENCOUNTER — Ambulatory Visit: Payer: BC Managed Care – PPO | Admitting: Family Medicine

## 2013-07-31 ENCOUNTER — Emergency Department (HOSPITAL_COMMUNITY): Payer: BC Managed Care – PPO

## 2013-07-31 ENCOUNTER — Encounter (HOSPITAL_COMMUNITY): Payer: Self-pay | Admitting: Emergency Medicine

## 2013-07-31 ENCOUNTER — Telehealth: Payer: Self-pay | Admitting: Family Medicine

## 2013-07-31 ENCOUNTER — Inpatient Hospital Stay (HOSPITAL_COMMUNITY)
Admission: EM | Admit: 2013-07-31 | Discharge: 2013-08-10 | DRG: 853 | Disposition: A | Discharge status: Home Health | Payer: BC Managed Care – PPO | Attending: Internal Medicine | Admitting: Internal Medicine

## 2013-07-31 ENCOUNTER — Inpatient Hospital Stay (HOSPITAL_COMMUNITY): Payer: BC Managed Care – PPO

## 2013-07-31 DIAGNOSIS — D62 Acute posthemorrhagic anemia: Secondary | ICD-10-CM | POA: Diagnosis present

## 2013-07-31 DIAGNOSIS — N179 Acute kidney failure, unspecified: Secondary | ICD-10-CM

## 2013-07-31 DIAGNOSIS — E1169 Type 2 diabetes mellitus with other specified complication: Secondary | ICD-10-CM | POA: Diagnosis present

## 2013-07-31 DIAGNOSIS — K750 Abscess of liver: Secondary | ICD-10-CM

## 2013-07-31 DIAGNOSIS — Z87891 Personal history of nicotine dependence: Secondary | ICD-10-CM

## 2013-07-31 DIAGNOSIS — I82409 Acute embolism and thrombosis of unspecified deep veins of unspecified lower extremity: Secondary | ICD-10-CM

## 2013-07-31 DIAGNOSIS — N19 Unspecified kidney failure: Secondary | ICD-10-CM

## 2013-07-31 DIAGNOSIS — I10 Essential (primary) hypertension: Secondary | ICD-10-CM | POA: Diagnosis present

## 2013-07-31 DIAGNOSIS — A498 Other bacterial infections of unspecified site: Secondary | ICD-10-CM

## 2013-07-31 DIAGNOSIS — R109 Unspecified abdominal pain: Secondary | ICD-10-CM

## 2013-07-31 DIAGNOSIS — K5792 Diverticulitis of intestine, part unspecified, without perforation or abscess without bleeding: Secondary | ICD-10-CM

## 2013-07-31 DIAGNOSIS — N731 Chronic parametritis and pelvic cellulitis: Secondary | ICD-10-CM | POA: Diagnosis present

## 2013-07-31 DIAGNOSIS — K56609 Unspecified intestinal obstruction, unspecified as to partial versus complete obstruction: Secondary | ICD-10-CM

## 2013-07-31 DIAGNOSIS — Z833 Family history of diabetes mellitus: Secondary | ICD-10-CM

## 2013-07-31 DIAGNOSIS — A419 Sepsis, unspecified organism: Principal | ICD-10-CM

## 2013-07-31 DIAGNOSIS — I498 Other specified cardiac arrhythmias: Secondary | ICD-10-CM | POA: Diagnosis not present

## 2013-07-31 DIAGNOSIS — K630 Abscess of intestine: Secondary | ICD-10-CM | POA: Diagnosis present

## 2013-07-31 DIAGNOSIS — K661 Hemoperitoneum: Secondary | ICD-10-CM | POA: Diagnosis not present

## 2013-07-31 DIAGNOSIS — E876 Hypokalemia: Secondary | ICD-10-CM | POA: Diagnosis not present

## 2013-07-31 DIAGNOSIS — Z8719 Personal history of other diseases of the digestive system: Secondary | ICD-10-CM | POA: Diagnosis present

## 2013-07-31 DIAGNOSIS — K5732 Diverticulitis of large intestine without perforation or abscess without bleeding: Secondary | ICD-10-CM

## 2013-07-31 DIAGNOSIS — R7303 Prediabetes: Secondary | ICD-10-CM | POA: Diagnosis present

## 2013-07-31 DIAGNOSIS — R188 Other ascites: Secondary | ICD-10-CM

## 2013-07-31 DIAGNOSIS — I824Z9 Acute embolism and thrombosis of unspecified deep veins of unspecified distal lower extremity: Secondary | ICD-10-CM | POA: Diagnosis present

## 2013-07-31 DIAGNOSIS — E872 Acidosis, unspecified: Secondary | ICD-10-CM | POA: Diagnosis present

## 2013-07-31 DIAGNOSIS — K5730 Diverticulosis of large intestine without perforation or abscess without bleeding: Secondary | ICD-10-CM

## 2013-07-31 DIAGNOSIS — Z6841 Body Mass Index (BMI) 40.0 and over, adult: Secondary | ICD-10-CM

## 2013-07-31 DIAGNOSIS — B952 Enterococcus as the cause of diseases classified elsewhere: Secondary | ICD-10-CM

## 2013-07-31 DIAGNOSIS — I959 Hypotension, unspecified: Secondary | ICD-10-CM

## 2013-07-31 DIAGNOSIS — J9600 Acute respiratory failure, unspecified whether with hypoxia or hypercapnia: Secondary | ICD-10-CM | POA: Diagnosis present

## 2013-07-31 DIAGNOSIS — E7800 Pure hypercholesterolemia, unspecified: Secondary | ICD-10-CM | POA: Diagnosis present

## 2013-07-31 DIAGNOSIS — K651 Peritoneal abscess: Secondary | ICD-10-CM

## 2013-07-31 DIAGNOSIS — K625 Hemorrhage of anus and rectum: Secondary | ICD-10-CM

## 2013-07-31 DIAGNOSIS — E118 Type 2 diabetes mellitus with unspecified complications: Secondary | ICD-10-CM | POA: Diagnosis present

## 2013-07-31 DIAGNOSIS — N170 Acute kidney failure with tubular necrosis: Secondary | ICD-10-CM | POA: Diagnosis present

## 2013-07-31 DIAGNOSIS — Z8249 Family history of ischemic heart disease and other diseases of the circulatory system: Secondary | ICD-10-CM

## 2013-07-31 DIAGNOSIS — Z880 Allergy status to penicillin: Secondary | ICD-10-CM

## 2013-07-31 DIAGNOSIS — K589 Irritable bowel syndrome without diarrhea: Secondary | ICD-10-CM | POA: Diagnosis present

## 2013-07-31 DIAGNOSIS — R6520 Severe sepsis without septic shock: Secondary | ICD-10-CM

## 2013-07-31 DIAGNOSIS — R6521 Severe sepsis with septic shock: Secondary | ICD-10-CM

## 2013-07-31 DIAGNOSIS — E119 Type 2 diabetes mellitus without complications: Secondary | ICD-10-CM

## 2013-07-31 LAB — CBC WITH DIFFERENTIAL/PLATELET
BASOS ABS: 0 10*3/uL (ref 0.0–0.1)
BASOS PCT: 0 % (ref 0–1)
Eosinophils Absolute: 0 10*3/uL (ref 0.0–0.7)
Eosinophils Relative: 0 % (ref 0–5)
HEMATOCRIT: 40.4 % (ref 36.0–46.0)
HEMOGLOBIN: 14.2 g/dL (ref 12.0–15.0)
LYMPHS PCT: 8 % — AB (ref 12–46)
Lymphs Abs: 0.8 10*3/uL (ref 0.7–4.0)
MCH: 27.6 pg (ref 26.0–34.0)
MCHC: 35.1 g/dL (ref 30.0–36.0)
MCV: 78.4 fL (ref 78.0–100.0)
Monocytes Absolute: 0.8 10*3/uL (ref 0.1–1.0)
Monocytes Relative: 8 % (ref 3–12)
NEUTROS ABS: 8 10*3/uL — AB (ref 1.7–7.7)
Neutrophils Relative %: 84 % — ABNORMAL HIGH (ref 43–77)
Platelets: 189 10*3/uL (ref 150–400)
RBC: 5.15 MIL/uL — ABNORMAL HIGH (ref 3.87–5.11)
RDW: 14.1 % (ref 11.5–15.5)
WBC: 9.6 10*3/uL (ref 4.0–10.5)

## 2013-07-31 LAB — GLUCOSE, CAPILLARY: Glucose-Capillary: 104 mg/dL — ABNORMAL HIGH (ref 70–99)

## 2013-07-31 LAB — URINE MICROSCOPIC-ADD ON

## 2013-07-31 LAB — URINALYSIS, ROUTINE W REFLEX MICROSCOPIC
GLUCOSE, UA: NEGATIVE mg/dL
Hgb urine dipstick: NEGATIVE
Ketones, ur: 15 mg/dL — AB
Nitrite: POSITIVE — AB
PROTEIN: 100 mg/dL — AB
Specific Gravity, Urine: 1.027 (ref 1.005–1.030)
UROBILINOGEN UA: 1 mg/dL (ref 0.0–1.0)
pH: 5 (ref 5.0–8.0)

## 2013-07-31 LAB — MRSA PCR SCREENING: MRSA BY PCR: NEGATIVE

## 2013-07-31 LAB — PROTIME-INR
INR: 1.61 — ABNORMAL HIGH (ref 0.00–1.49)
Prothrombin Time: 18.7 seconds — ABNORMAL HIGH (ref 11.6–15.2)

## 2013-07-31 LAB — COMPREHENSIVE METABOLIC PANEL
ALT: 146 U/L — ABNORMAL HIGH (ref 0–35)
AST: 92 U/L — ABNORMAL HIGH (ref 0–37)
Albumin: 3 g/dL — ABNORMAL LOW (ref 3.5–5.2)
Alkaline Phosphatase: 89 U/L (ref 39–117)
BILIRUBIN TOTAL: 1.6 mg/dL — AB (ref 0.3–1.2)
BUN: 47 mg/dL — AB (ref 6–23)
CALCIUM: 9.2 mg/dL (ref 8.4–10.5)
CHLORIDE: 91 meq/L — AB (ref 96–112)
CO2: 22 meq/L (ref 19–32)
CREATININE: 4.22 mg/dL — AB (ref 0.50–1.10)
GFR calc non Af Amer: 11 mL/min — ABNORMAL LOW (ref >90)
GFR, EST AFRICAN AMERICAN: 13 mL/min — AB (ref >90)
GLUCOSE: 142 mg/dL — AB (ref 70–99)
Potassium: 3.7 mEq/L (ref 3.7–5.3)
Sodium: 136 mEq/L — ABNORMAL LOW (ref 137–147)
Total Protein: 8.1 g/dL (ref 6.0–8.3)

## 2013-07-31 LAB — I-STAT CG4 LACTIC ACID, ED: LACTIC ACID, VENOUS: 3.27 mmol/L — AB (ref 0.5–2.2)

## 2013-07-31 LAB — PRO B NATRIURETIC PEPTIDE: Pro B Natriuretic peptide (BNP): 3000 pg/mL — ABNORMAL HIGH (ref 0–125)

## 2013-07-31 LAB — APTT: aPTT: 41 seconds — ABNORMAL HIGH (ref 24–37)

## 2013-07-31 MED ORDER — DEXTROSE 5 % IV SOLN
1.0000 g | Freq: Three times a day (TID) | INTRAVENOUS | Status: DC
  Administered 2013-07-31 – 2013-08-01 (×3): 1 g via INTRAVENOUS
  Filled 2013-07-31 (×4): qty 1

## 2013-07-31 MED ORDER — SODIUM CHLORIDE 0.9 % IV BOLUS (SEPSIS)
500.0000 mL | Freq: Once | INTRAVENOUS | Status: AC
  Administered 2013-07-31: 500 mL via INTRAVENOUS

## 2013-07-31 MED ORDER — FENTANYL CITRATE 0.05 MG/ML IJ SOLN
25.0000 ug | INTRAMUSCULAR | Status: DC | PRN
  Administered 2013-07-31 – 2013-08-01 (×4): 50 ug via INTRAVENOUS
  Filled 2013-07-31 (×4): qty 2

## 2013-07-31 MED ORDER — LEVOFLOXACIN IN D5W 500 MG/100ML IV SOLN: 500.0000 mg | INTRAVENOUS | Status: DC

## 2013-07-31 MED ORDER — METRONIDAZOLE IN NACL 5-0.79 MG/ML-% IV SOLN
500.0000 mg | Freq: Three times a day (TID) | INTRAVENOUS | Status: DC
  Administered 2013-07-31 – 2013-08-07 (×20): 500 mg via INTRAVENOUS
  Filled 2013-07-31 (×23): qty 100

## 2013-07-31 MED ORDER — LEVOFLOXACIN IN D5W 750 MG/150ML IV SOLN
750.0000 mg | Freq: Once | INTRAVENOUS | Status: AC
Start: 2013-07-31 — End: 2013-07-31
  Administered 2013-07-31: 750 mg via INTRAVENOUS
  Filled 2013-07-31: qty 150

## 2013-07-31 MED ORDER — FENTANYL CITRATE 0.05 MG/ML IJ SOLN
50.0000 ug | Freq: Once | INTRAMUSCULAR | Status: AC
  Administered 2013-07-31: 50 ug via INTRAVENOUS
  Filled 2013-07-31: qty 2

## 2013-07-31 MED ORDER — LIDOCAINE HCL 2 % EX GEL
Freq: Once | CUTANEOUS | Status: AC
  Administered 2013-07-31: 18:00:00
  Filled 2013-07-31: qty 20

## 2013-07-31 MED ORDER — ACETAMINOPHEN 325 MG PO TABS
650.0000 mg | ORAL_TABLET | Freq: Four times a day (QID) | ORAL | Status: DC | PRN
  Administered 2013-07-31: 650 mg via ORAL
  Filled 2013-07-31: qty 2

## 2013-07-31 MED ORDER — IOHEXOL 300 MG/ML  SOLN
25.0000 mL | INTRAMUSCULAR | Status: AC
  Administered 2013-07-31: 25 mL via ORAL

## 2013-07-31 MED ORDER — ONDANSETRON HCL 4 MG/2ML IJ SOLN
4.0000 mg | Freq: Once | INTRAMUSCULAR | Status: AC
  Administered 2013-07-31: 4 mg via INTRAVENOUS
  Filled 2013-07-31: qty 2

## 2013-07-31 MED ORDER — SODIUM CHLORIDE 0.9 % IV SOLN
1000.0000 mL | Freq: Once | INTRAVENOUS | Status: AC
  Administered 2013-07-31: 1000 mL via INTRAVENOUS

## 2013-07-31 MED ORDER — SODIUM CHLORIDE 0.9 % IV SOLN
1000.0000 mL | INTRAVENOUS | Status: DC
  Administered 2013-07-31: 1000 mL via INTRAVENOUS

## 2013-07-31 MED ORDER — LIDOCAINE VISCOUS 2 % MT SOLN: 15.0000 mL | Freq: Once | OROMUCOSAL | Status: DC

## 2013-07-31 MED ORDER — VANCOMYCIN HCL 10 G IV SOLR
2500.0000 mg | Freq: Once | INTRAVENOUS | Status: AC
  Administered 2013-07-31: 2500 mg via INTRAVENOUS
  Filled 2013-07-31: qty 2500

## 2013-07-31 MED ORDER — METRONIDAZOLE IN NACL 5-0.79 MG/ML-% IV SOLN
500.0000 mg | Freq: Once | INTRAVENOUS | Status: AC
  Administered 2013-07-31: 500 mg via INTRAVENOUS
  Filled 2013-07-31: qty 100

## 2013-07-31 MED ORDER — SODIUM CHLORIDE 0.9 % IV SOLN
INTRAVENOUS | Status: DC
  Administered 2013-07-31: 22:00:00 via INTRAVENOUS

## 2013-07-31 MED ORDER — SODIUM CHLORIDE 0.9 % IV SOLN
Freq: Once | INTRAVENOUS | Status: AC
  Administered 2013-07-31: 18:00:00 via INTRAVENOUS

## 2013-07-31 NOTE — Consult Note (Signed)
Reason for Consult:partial SBO Referring Physician: Ria Comment MD  [name] is an 58 y.o. female.  HPI: 2 DAY hx of diffuse abdominal pain that started Sunday before church with diarrhea.  No blood in stool. Poor appetite. No vomiting.  Crampy in nature. Some fever and chills.  Came to ED hypotensive but better now with some volume.  Past Medical History  Diagnosis Date  . Diverticulosis of colon (without mention of hemorrhage)   . Pure hypercholesterolemia   . Other abnormal glucose   . Essential hypertension, benign   . IBS (irritable bowel syndrome)   . Obesity, unspecified   . Unspecified vitamin D deficiency   . Left knee pain 2012    eval by ortho - bone spurs and DJD, treated with steroid shot which helped temporarily    Past Surgical History  Procedure Laterality Date  . Cesarean section      due to BP elevation  . Colonoscopy  2000    Diverticuli-Dr. Sharlett Iles    Family History  Problem Relation Age of Onset  . Hypertension Father   . Cancer Father     prostate  . Heart disease Father     CABG x 2  . Cancer Mother     Lung-quit smoking 20 years prior to Dx  . Hypertension Mother   . Diabetes      + family hx  . Drug abuse Brother   . Stroke Maternal Grandmother   . Sleep apnea Brother     with [facility] estranged    Social History:  reports that she has quit smoking. She has never used smokeless tobacco. She reports that she drinks alcohol. She reports that she does not use illicit drugs.  Allergies:  Allergies  Allergen Reactions  . Lisinopril     REACTION: lethargy, cough.  . Penicillins     REACTION: LYMPH NODES SWELL    Medications: I have reviewed the patient's current medications.  Results for orders placed during the hospital encounter of 07/31/13 (from the past 48 hour(s))  COMPREHENSIVE METABOLIC PANEL     Status: Abnormal   Collection Time    07/31/13 11:00 AM      Result Value Ref Range   Sodium 136 (*) 137 - 147 mEq/L   Potassium 3.7  3.7 - 5.3 mEq/L   Chloride 91 (*) 96 - 112 mEq/L   CO2 22  19 - 32 mEq/L   Glucose, Bld 142 (*) 70 - 99 mg/dL   BUN 47 (*) 6 - 23 mg/dL   Creatinine, Ser 4.22 (*) 0.50 - 1.10 mg/dL   Calcium 9.2  8.4 - 10.5 mg/dL   Total Protein 8.1  6.0 - 8.3 g/dL   Albumin 3.0 (*) 3.5 - 5.2 g/dL   AST 92 (*) 0 - 37 U/L   ALT 146 (*) 0 - 35 U/L   Alkaline Phosphatase 89  39 - 117 U/L   Total Bilirubin 1.6 (*) 0.3 - 1.2 mg/dL   GFR calc non Af Amer 11 (*) >90 mL/min   GFR calc Af Amer 13 (*) >90 mL/min   Comment: (NOTE)     The eGFR has been calculated using the CKD EPI equation.     This calculation has not been validated in all clinical situations.     eGFR's persistently <90 mL/min signify possible Chronic Kidney     Disease.  CBC WITH DIFFERENTIAL     Status: Abnormal   Collection Time    07/31/13  11:00 AM      Result Value Ref Range   WBC 9.6  4.0 - 10.5 K/uL   RBC 5.15 (*) 3.87 - 5.11 MIL/uL   Hemoglobin 14.2  12.0 - 15.0 g/dL   HCT 40.4  36.0 - 46.0 %   MCV 78.4  78.0 - 100.0 fL   MCH 27.6  26.0 - 34.0 pg   MCHC 35.1  30.0 - 36.0 g/dL   RDW 14.1  11.5 - 15.5 %   Platelets 189  150 - 400 K/uL   Neutrophils Relative % 84 (*) 43 - 77 %   Lymphocytes Relative 8 (*) 12 - 46 %   Monocytes Relative 8  3 - 12 %   Eosinophils Relative 0  0 - 5 %   Basophils Relative 0  0 - 1 %   Neutro Abs 8.0 (*) 1.7 - 7.7 K/uL   Lymphs Abs 0.8  0.7 - 4.0 K/uL   Monocytes Absolute 0.8  0.1 - 1.0 K/uL   Eosinophils Absolute 0.0  0.0 - 0.7 K/uL   Basophils Absolute 0.0  0.0 - 0.1 K/uL   WBC Morphology MILD LEFT SHIFT (1-5% METAS, OCC MYELO, OCC BANDS)    PRO B NATRIURETIC PEPTIDE     Status: Abnormal   Collection Time    07/31/13 11:00 AM      Result Value Ref Range   Pro B Natriuretic peptide (BNP) 3000.0 (*) 0 - 125 pg/mL  I-STAT CG4 LACTIC ACID, ED     Status: Abnormal   Collection Time    07/31/13 11:39 AM      Result Value Ref Range   Lactic Acid, Venous 3.27 (*) 0.5 - 2.2 mmol/L    URINALYSIS, ROUTINE W REFLEX MICROSCOPIC     Status: Abnormal   Collection Time    07/31/13 12:19 PM      Result Value Ref Range   Color, Urine AMBER (*) YELLOW   Comment: BIOCHEMICALS MAY BE AFFECTED BY COLOR   APPearance TURBID (*) CLEAR   Specific Gravity, Urine 1.027  1.005 - 1.030   pH 5.0  5.0 - 8.0   Glucose, UA NEGATIVE  NEGATIVE mg/dL   Hgb urine dipstick NEGATIVE  NEGATIVE   Bilirubin Urine MODERATE (*) NEGATIVE   Ketones, ur 15 (*) NEGATIVE mg/dL   Protein, ur 100 (*) NEGATIVE mg/dL   Urobilinogen, UA 1.0  0.0 - 1.0 mg/dL   Nitrite POSITIVE (*) NEGATIVE   Leukocytes, UA SMALL (*) NEGATIVE  URINE MICROSCOPIC-ADD ON     Status: Abnormal   Collection Time    07/31/13 12:19 PM      Result Value Ref Range   Squamous Epithelial / LPF MANY (*) RARE   WBC, UA 3-6  <3 WBC/hpf   RBC / HPF 0-2  <3 RBC/hpf   Bacteria, UA FEW (*) RARE   Casts GRANULAR CAST (*) NEGATIVE   Urine-Other AMORPHOUS URATES/PHOSPHATES      Ct Abdomen Pelvis Wo Contrast  07/31/2013   CLINICAL DATA:  Abdominal pain starting 2 nights ago. Pain located in the central upper abdomen and across the lower abdomen.  EXAM: CT ABDOMEN AND PELVIS WITHOUT CONTRAST  TECHNIQUE: Multidetector CT imaging of the abdomen and pelvis was performed following the standard protocol without intravenous contrast.  COMPARISON:  10/27/2007  FINDINGS: In the pelvis, where is a small collection that lies posterior and to the left of the sigmoid colon adjacent to the iliopsoas muscle. It measures approximately 5.5 cm in  greatest transverse dimension. It is likely a peridiverticular abscess.  There is extensive diverticulosis of the colon. There is some thickening of the mid sigmoid colon near the presumed abscess. There are mild associated inflammatory changes. There is some inflammatory change extends from a loop of small bowel in the left anterior pelvis superiorly and posteriorly.  There is small bowel dilation of the proximal mid small  bowel. The exact transition point is not definitive, but is suggested in the left anterior pelvis associated with the pelvic inflammation.  There is a small amount of ascites that primarily collects in the pelvis that is also that is seen adjacent to the liver and spleen.  There are multiple prominent, but not pathologically enlarged, mesenteric lymph nodes.  The heart is mildly enlarged. There is lung base atelectasis, greater on the left. A minimal left pleural effusion is present.  There is uptake, masslike area of relative hypoattenuation arising from the lateral segment of the left lobe of the liver. It measures 9.3 cm x 8.1 cm by 8.2 cm. There is some localized fluid adjacent to the left lobe of the liver and the lesser curvature of the stomach. No other liver lesion. Spleen is unremarkable. No convincing gallbladder abnormality. No bile duct dilation. Normal pancreas. No adrenal masses. Kidneys in ureters are unremarkable. Bladder is decompressed by a Foley catheter. Questionable right uterine fibroid. Uterus otherwise unremarkable.  There are degenerative changes noted along the spine, involving the SI joints and both hips. No osteoblastic or osteolytic lesions.  There are small mass or fluid collection measuring 2.5 cm in the anterior upper abdominal midline, within the deep subcutaneous fat. This is nonspecific. No evidence of an abdominal wall hernia.  IMPRESSION: 1. Partial small bowel obstruction. Transition point is not definitive, but appears to be in the left anterior pelvis where there are inflammatory changes. 2. There is a 5.5 cm collection in the left lower pelvis contiguous with the uterus. The patient has had diverticulitis in this location previously. This may reflect a peridiverticular abscess. 3. Extensive diverticulosis throughout the colon. There is an area of apparent wall thickening of the mid sigmoid colon which would also support diverticulitis. 4. Small amount of ascites. There is  focal fluid that collects between the left lobe of the liver and the stomach. There is a small mass or fluid collection in the subcutaneous fat of the upper abdominal midline measuring 2.5 cm x 2.1 cm. 5. There is a hypo attenuating mass-like lesion in the lateral segment of the left lobe of the liver measuring 9.3 cm in greatest dimension. 6. Given this combination of findings. Patient would benefit from a contrast enhanced CT, when the patient can tolerate this procedure or if renal function allows.   Electronically Signed   By: Lajean Manes M.D.   On: 07/31/2013 16:49   Dg Chest Port 1 View  (if Code Sepsis Called)  07/31/2013   CLINICAL DATA:  Shortness of breath with chest pain.  EXAM: PORTABLE CHEST - 1 VIEW  COMPARISON:  Followup  FINDINGS: Low lung volumes. Enlarged cardiomediastinal silhouette, likely cardiomegaly. Mild vascular congestion. Small bilateral effusions are probable. No acute osseous findings.  IMPRESSION: Low lung volumes.  Cardiomegaly with early vascular congestion.   Electronically Signed   By: Rolla Flatten M.D.   On: 07/31/2013 12:03    Review of Systems  Constitutional: Positive for fever and chills.  HENT: Negative.   Eyes: Negative.   Respiratory: Negative.   Cardiovascular: Negative.  Gastrointestinal: Positive for abdominal pain and diarrhea.  Genitourinary: Negative.   Musculoskeletal: Negative.   Skin: Negative.   Neurological: Negative.   Endo/Heme/Allergies: Negative.   Psychiatric/Behavioral: Negative.    Blood pressure 93/64, pulse 124, temperature 98.8 F (37.1 C), temperature source Oral, resp. rate 32, height _0  (1.549 m), weight 280 lb (127.007 kg), SpO2 95.00%. Physical Exam  Constitutional: She is oriented to person, place, and time. She appears well-developed and well-nourished.  HENT:  Head: Normocephalic and atraumatic.  Eyes: EOM are normal. Pupils are equal, round, and reactive to light.  Neck: Normal range of motion. Neck supple.    Cardiovascular: Tachycardia present.   Respiratory: Effort normal.  GI: There is tenderness in the right lower quadrant and left lower quadrant. There is guarding. There is no rigidity and no rebound. No hernia. Hernia confirmed negative in the ventral area.    Musculoskeletal: Normal range of motion.  Neurological: She is alert and oriented to person, place, and time.  Skin: Skin is warm and dry.  Psychiatric: She has a normal mood and affect. Her behavior is normal. Judgment and thought content normal.    Assessment/Plan: Acute diverticulitis with probable abscess  Needs IR consult in am to consider drainage.  Need abx and NPO pSBO  NGT NPO IVF ARF  Probably secondary to volume depletion IVF Morbid obesity DM 2  Recommend CCM consult/ medicine admission given multiple medical issues and renal failure.  No acute need for surgical intervention at this point.  Epigastric mass probable cyst [facility] will follow along.     Charlene Cowdrey A. 07/31/2013, 6:47 PM

## 2013-07-31 NOTE — ED Notes (Signed)
Ct aware contrast complete.

## 2013-07-31 NOTE — ED Notes (Signed)
Per EMS, pt c/o abd pain and a knot palpable in upper abd.  Sinus tach.  Difficulty with veins and IV not placed.  Alert and oriented.  Hx HTN and diverticulosis.  Allergy to penicillin.  Reports intermittent diaphoresis and hyperventilating.  VS 140/90, 94% on room air, 100% on 3L.    Per pt, abd pain started Monday night.  Pain located in central upper abdomen and across lower abdomen.  Lower abd is most painful.  Knot painful with palpation.   Describes pain as dull and intermittent.  Rated 8/10.  Pt noticed knot last night.  Pt reports diarrhea Sunday morning, but no episodes since then.  Denies nausea or vomiting.  Pt reports diaphoresis and hyperventilation when pain occurs. Breathing deep precipitates pain.  Pt denies trying to treat pain at home.  Pt reports feeling SOB.   No acute distress, respirations equal, skin warm and dry.

## 2013-07-31 NOTE — H&P (Signed)
PULMONARY / CRITICAL CARE MEDICINE   Name: Leah Peterson MRN: [id number] DOB: 1956-03-25    ADMISSION DATE:  07/31/2013 CONSULTATION DATE:  07/31/2013  REFERRING MD :  Ria Bush MD PRIMARY SERVICE: PCCM  CHIEF COMPLAINT:  Abdominal pain and diarrhea  BRIEF PATIENT DESCRIPTION: 58 year old with a PMH of diverticulitis (last treatment one year ago), HTN, IBS, and obesity who presents with abdominal pain and diarrhea.  She is noted to be hypotensive with BP 80's/60's and lactic acid was mildly elevated at 3.27 upon arrival to the ED. Her abd CT showed diverticulitis and three fluid collection with largest one 9.3 cm at left lobe of liver.   SIGNIFICANT EVENTS / STUDIES:   07/31/13 Abd CT   1. Partial small bowel obstruction. Transition point is not  definitive, but appears to be in the left anterior pelvis where  there are inflammatory changes.  2. There is a 5.5 cm collection in the left lower pelvis contiguous  with the uterus. The patient has had diverticulitis in this location  previously. This may reflect a peridiverticular abscess.  3. Extensive diverticulosis throughout the colon. There is an area  of apparent wall thickening of the mid sigmoid colon which would  also support diverticulitis.  4. Small amount of ascites. There is focal fluid that collects  between the left lobe of the liver and the stomach. There is a small  mass or fluid collection in the subcutaneous fat of the upper  abdominal midline measuring 2.5 cm x 2.1 cm.  5. There is a hypo attenuating mass-like lesion in the lateral  segment of the left lobe of the liver measuring 9.3 cm in greatest  dimension.  6. Given this combination of findings. Patient would benefit from a  contrast enhanced CT, when the patient can tolerate this procedure  or if renal function allows.   LINES / TUBES:  07/31/13 NGT tube  CULTURES: 07/31/13 Blood cultures x 2 07/31/13 Urine culture 07/31/13 lactic acid  3.27  ANTIBIOTICS: 07/31/13  levofloxacin 750 mg x 1>>D/C'd on 07/31/13 07/31/13  Metronidazole  07/31/13  Aztreonam   HISTORY OF PRESENT ILLNESS:  Leah Peterson is a 58 year old woman with a PMH of HTN and diverticulitis who presents with abdominal pain, weakness, chills.  She began to feel unwell four days ago with 3 episodes of non-bloody diarrhea.  The following day she began to experience chills and sweats which caused her to stay home from work.  She then developed lower abdominal pain, lightheadedness, weakness and dyspnea with exertion.  She also noticed a lump in her epigastric area that was painful to palpation.  She reports decreased po intake but denies nausea, vomiting, headache or chest pain.  Her urine has been dark for the past day but she denies dysuria or increased frequency.  She notes decreased urinary production for the past few days.   PAST MEDICAL HISTORY :  Past Medical History  Diagnosis Date  . Diverticulosis of colon (without mention of hemorrhage)   . Pure hypercholesterolemia   . Other abnormal glucose   . Essential hypertension, benign   . IBS (irritable bowel syndrome)   . Obesity, unspecified   . Unspecified vitamin D deficiency   . Left knee pain 2012    eval by ortho - bone spurs and DJD, treated with steroid shot which helped temporarily   Past Surgical History  Procedure Laterality Date  . Cesarean section      due to BP elevation  .  Colonoscopy  2000    Diverticuli-Dr. Sharlett Iles   Prior to Admission medications   Medication Sig Start Date End Date Taking? Authorizing Provider  amLODipine (NORVASC) 5 MG tablet Take 5 mg by mouth daily.   Yes Historical Provider, MD  cetirizine (ZYRTEC) 10 MG tablet Take 10 mg by mouth daily as needed.     Yes Historical Provider, MD  cholecalciferol (VITAMIN D) 1000 UNITS tablet Take 1,000 Units by mouth daily.    Yes Historical Provider, MD  Multiple Vitamin (MULTIVITAMIN WITH MINERALS) TABS tablet Take 1 tablet by mouth  daily.   Yes Historical Provider, MD  triamterene-hydrochlorothiazide (MAXZIDE-25) 37.5-25 MG per tablet Take 1 tablet by mouth daily.   Yes Historical Provider, MD  dicyclomine (BENTYL) 10 MG capsule Take 1 capsule (10 mg total) by mouth 4 (four) times daily as needed. 09/19/12   Ria Bush, MD   Allergies  Allergen Reactions  . Lisinopril     REACTION: lethargy, cough.  . Penicillins     REACTION: LYMPH NODES SWELL   FAMILY HISTORY:  Family History  Problem Relation Age of Onset  . Hypertension Father   . Cancer Father     prostate  . Heart disease Father     CABG x 2  . Cancer Mother     Lung-quit smoking 20 years prior to Dx  . Hypertension Mother   . Diabetes      + family hx  . Drug abuse Brother   . Stroke Maternal Grandmother   . Sleep apnea Brother     with [facility] estranged   SOCIAL HISTORY:  reports that she has quit smoking. She has never used smokeless tobacco. She reports that she drinks alcohol. She reports that she does not use illicit drugs.  REVIEW OF SYSTEMS:  General:  She has generalized weakness. HEENT:  She denies URI symptoms but has reports postnasal drip 2/2 to allergies Cardiopulmonary:  She is dyspneic.  She denies chest pain.   GI:  She reports lower abdominal pain worsened by breathing and epigastric tenderness to palpation.  She has not had a BM since diarrhea four days ago. GU: She reports dark urine and decreased urinary output in the past day.  She denies dysuria or increased frequency. Neuro:  She denies unilateral weakness or difficulties ambulating.  SUBJECTIVE:   VITAL SIGNS: Temp:  [98.8 F (37.1 C)-101.9 F (38.8 C)] 98.8 F (37.1 C) (04/01 1629) Pulse Rate:  [113-128] 124 (04/01 1830) Resp:  [22-50] 32 (04/01 1830) BP: (82-120)/(59-83) 93/64 mmHg (04/01 1830) SpO2:  [95 %-98 %] 95 % (04/01 1830) Weight:  [127.007 kg (280 lb)] 127.007 kg (280 lb) (04/01 1113) HEMODYNAMICS: N/A   VENTILATOR SETTINGS:  N/A   INTAKE /  OUTPUT: Intake/Output     03/31 0701 - 04/01 0700 04/01 0701 - 04/02 0700   I.V. (mL/kg)  2000 (15.7)   Total Intake(mL/kg)  2000 (15.7)   Urine (mL/kg/hr)  35   Total Output   35   Net   +1965          PHYSICAL EXAMINATION: General:  Sitting up in bed in no acute distress Neuro:  AAO x 3, CN 2-12 grossly intact, able to move all four extremities independently HEENT:  PERRL, EOMI, oropharynx clear Cardiovascular:  RRR, +S1, +S2, no m/r/g, no JVD or edema Lungs:  Wheezing at bilateral bases Abdomen:  mildly distended. Non-pulsatile tender mass in the epigastric area, lower quadrants non-tender, no rebound, no rigidity, no guarding, +  BS in all four quadrants Musculoskeletal:  5/5 MMS, able to move all four extremities Skin:  Intact, no rashes appreciated  LABS:  CBC  Recent Labs Lab 07/31/13 1100  WBC 9.6  HGB 14.2  HCT 40.4  PLT 189   Coag's No results found for this basename: APTT, INR,  in the last 168 hours BMET  Recent Labs Lab 07/31/13 1100  NA 136*  K 3.7  CL 91*  CO2 22  BUN 47*  CREATININE 4.22*  GLUCOSE 142*   Electrolytes  Recent Labs Lab 07/31/13 1100  CALCIUM 9.2   Sepsis Markers  Recent Labs Lab 07/31/13 1139  LATICACIDVEN 3.27*   ABG No results found for this basename: PHART, PCO2ART, PO2ART,  in the last 168 hours Liver Enzymes  Recent Labs Lab 07/31/13 1100  AST 92*  ALT 146*  ALKPHOS 89  BILITOT 1.6*  ALBUMIN 3.0*   Cardiac Enzymes  Recent Labs Lab 07/31/13 1100  PROBNP 3000.0*   Glucose No results found for this basename: GLUCAP,  in the last 168 hours  Imaging Ct Abdomen Pelvis Wo Contrast  07/31/2013   CLINICAL DATA:  Abdominal pain starting 2 nights ago. Pain located in the central upper abdomen and across the lower abdomen.  EXAM: CT ABDOMEN AND PELVIS WITHOUT CONTRAST  TECHNIQUE: Multidetector CT imaging of the abdomen and pelvis was performed following the standard protocol without intravenous contrast.   COMPARISON:  10/27/2007  FINDINGS: In the pelvis, where is a small collection that lies posterior and to the left of the sigmoid colon adjacent to the iliopsoas muscle. It measures approximately 5.5 cm in greatest transverse dimension. It is likely a peridiverticular abscess.  There is extensive diverticulosis of the colon. There is some thickening of the mid sigmoid colon near the presumed abscess. There are mild associated inflammatory changes. There is some inflammatory change extends from a loop of small bowel in the left anterior pelvis superiorly and posteriorly.  There is small bowel dilation of the proximal mid small bowel. The exact transition point is not definitive, but is suggested in the left anterior pelvis associated with the pelvic inflammation.  There is a small amount of ascites that primarily collects in the pelvis that is also that is seen adjacent to the liver and spleen.  There are multiple prominent, but not pathologically enlarged, mesenteric lymph nodes.  The heart is mildly enlarged. There is lung base atelectasis, greater on the left. A minimal left pleural effusion is present.  There is uptake, masslike area of relative hypoattenuation arising from the lateral segment of the left lobe of the liver. It measures 9.3 cm x 8.1 cm by 8.2 cm. There is some localized fluid adjacent to the left lobe of the liver and the lesser curvature of the stomach. No other liver lesion. Spleen is unremarkable. No convincing gallbladder abnormality. No bile duct dilation. Normal pancreas. No adrenal masses. Kidneys in ureters are unremarkable. Bladder is decompressed by a Foley catheter. Questionable right uterine fibroid. Uterus otherwise unremarkable.  There are degenerative changes noted along the spine, involving the SI joints and both hips. No osteoblastic or osteolytic lesions.  There are small mass or fluid collection measuring 2.5 cm in the anterior upper abdominal midline, within the deep  subcutaneous fat. This is nonspecific. No evidence of an abdominal wall hernia.  IMPRESSION: 1. Partial small bowel obstruction. Transition point is not definitive, but appears to be in the left anterior pelvis where there are inflammatory changes. 2. There is  a 5.5 cm collection in the left lower pelvis contiguous with the uterus. The patient has had diverticulitis in this location previously. This may reflect a peridiverticular abscess. 3. Extensive diverticulosis throughout the colon. There is an area of apparent wall thickening of the mid sigmoid colon which would also support diverticulitis. 4. Small amount of ascites. There is focal fluid that collects between the left lobe of the liver and the stomach. There is a small mass or fluid collection in the subcutaneous fat of the upper abdominal midline measuring 2.5 cm x 2.1 cm. 5. There is a hypo attenuating mass-like lesion in the lateral segment of the left lobe of the liver measuring 9.3 cm in greatest dimension. 6. Given this combination of findings. Patient would benefit from a contrast enhanced CT, when the patient can tolerate this procedure or if renal function allows.   Electronically Signed   By: Lajean Manes M.D.   On: 07/31/2013 16:49   Dg Chest Port 1 View  (if Code Sepsis Called)  07/31/2013   CLINICAL DATA:  Shortness of breath with chest pain.  EXAM: PORTABLE CHEST - 1 VIEW  COMPARISON:  Followup  FINDINGS: Low lung volumes. Enlarged cardiomediastinal silhouette, likely cardiomegaly. Mild vascular congestion. Small bilateral effusions are probable. No acute osseous findings.  IMPRESSION: Low lung volumes.  Cardiomegaly with early vascular congestion.   Electronically Signed   By: Rolla Flatten M.D.   On: 07/31/2013 12:03    CXR: 07/31/13 small bilateral pleural effusions; cardiomegaly with vascular congestion  ASSESSMENT / PLAN:  PULMONARY A:  Tachypnea in setting of metabolic acidosis Concern for pulmonary edema after fluid  resuscitation P:  Goal SpO2>92 Supplemental oxygen  CARDIOVASCULAR A:  SIRS / sepsis P:  Goal MAP>60 Trend troponin / lactate May need vasopressors  RENAL A: AKI Hypovolemia P:   May need CVL NS bolus 2.7 L in ED NS@125  Trend BMP Avoid renal toxins   GASTROINTESTINAL A: Diverticulitis Intraabdominal abscess? Hypodense liver lesion ( abscess vs mass ) Partial SBO Nutrition GI Px is not required P:  IR consulted - drainage? CCS consulted - ex lap? NGT Liver US to better assess liver lesion Trend LFT  HEMATOLOGIC A:   VTE Px P: SCD Type and screen APTT / INR  INFECTIOUS A:   Intraabdominal infection? Soft tissue ( abdominal wall infection ) PCN allergy P:  Abx / cx as above  ENDOCRINE A:   Unknown adrenal function P:   Cortisol level  NEUROLOGIC A:  No acute abnormalities P:    Assessment and plan discussed with with attending physician and they are in agreement.  Duwaine Maxin, DO PGY-1 Charlann Lange, MD PGY-3  [facility]   I have personally obtained history, examined patient, evaluated and interpreted laboratory and imaging results, reviewed medical records, formulated assessment / plan and placed orders.  CRITICAL CARE:  The patient is critically ill with multiple organ systems failure and requires high complexity decision making for assessment and support, frequent evaluation and titration of therapies, application of advanced monitoring technologies and extensive interpretation of multiple databases. Critical Care Time devoted to patient care services described in this note is 45 minutes.   Doree Fudge, MD Pulmonary and [place] Pager: [phone]  07/31/2013, 9:19 PM

## 2013-07-31 NOTE — ED Notes (Signed)
Pt to ct scan via stretcher. Family updated on plan of care.

## 2013-07-31 NOTE — ED Notes (Signed)
Results of lactic acid given to Dr. Rogene Houston

## 2013-07-31 NOTE — Telephone Encounter (Signed)
Patient Information:  Caller Name: Berlyn  Phone: [phone]  Patient: Leah Peterson  Gender: Female  DOB: May 12, 1955  Age: 58 Years  PCP: Ria Bush [facility])  Pregnant: No  Office Follow Up:  Does the office need to follow up with this patient?: Yes  Instructions For The Office: Daughter calling 911.  RN Note:  Daughter is going to call EMS.  Pain with inspriation, movement and weakness.  Symptoms  Reason For Call & Symptoms: Patient states that onset Sunday 07/28/13 she had diarrhea.  Monday and Tuesday 07/29/13 "chlils and fever". She did not temperature.  She does not feel febrile today.  No cough, no runny nose and denies cold symptoms.  She states abdominal pain with deep inspiration and a "lemon size lump" sub sternal in upper Epigastric . Noted last night. Feels short of breath and pain with movement.  Daughter to phone states her mother is weak/unable to stand .  They are going to call EMS .  Triage stopped.  Reviewed Health History In EMR: Yes  Reviewed Medications In EMR: Yes  Reviewed Allergies In EMR: Yes  Reviewed Surgeries / Procedures: Yes  Date of Onset of Symptoms: 07/29/2013 OB / GYN:  LMP: Unknown  Guideline(s) Used:  Abdominal Pain - Upper  Disposition Per Guideline:   Call EMS 911 Now  Reason For Disposition Reached:   Shock suspected (e.g., cold/pale/clammy skin, too weak to stand)  Advice Given:  Call Back If:  You become worse.  RN Overrode Recommendation:  Go To ED  Daughter wants to call 911.

## 2013-07-31 NOTE — ED Notes (Signed)
Pt alert x4 aware of plan of care, toleration all treatment well. Daughter at bedside.

## 2013-07-31 NOTE — ED Provider Notes (Signed)
CSN: [id number]     Arrival date & time 07/31/13  1055 History   First MD Initiated Contact with Patient 07/31/13 1114     Chief Complaint  Patient presents with  . Code Sepsis     (Consider location/radiation/quality/duration/timing/severity/associated sxs/prior Treatment) The history is provided by the patient and the EMS personnel.   58 year old female brought in by EMS. Supposedly blood pressure at home was 875 systolic room air sats were 94%. Patient with complaint of abdominal pain left lower quadrant greater than right lower corner and. The symptoms started and a few days ago. Patient did have some diarrhea on Sunday but then resolved. The pain is gotten worse. Also developed epigastric abdominal pain and feels it is a knot there that started last night. Patient denies any nausea or vomiting. Patient states the pain is 10 out of 10. She did have diaphoresis and it does hurt to breathe but it makes the lower quadrants hurt when she presented. Patient had a history of diverticulitis in the past. Upon arrival here patient was noted to be significant we tachycardic heart rate 128 with a blood pressure of 82/66. Patient clearly was hypotensive in shock.  Past Medical History  Diagnosis Date  . Diverticulosis of colon (without mention of hemorrhage)   . Pure hypercholesterolemia   . Other abnormal glucose   . Essential hypertension, benign   . IBS (irritable bowel syndrome)   . Obesity, unspecified   . Unspecified vitamin D deficiency   . Left knee pain 2012    eval by ortho - bone spurs and DJD, treated with steroid shot which helped temporarily   Past Surgical History  Procedure Laterality Date  . Cesarean section      due to BP elevation  . Colonoscopy  2000    Diverticuli-Dr. Sharlett Iles   Family History  Problem Relation Age of Onset  . Hypertension Father   . Cancer Father     prostate  . Heart disease Father     CABG x 2  . Cancer Mother     Lung-quit smoking 20 years  prior to Dx  . Hypertension Mother   . Diabetes      + family hx  . Drug abuse Brother   . Stroke Maternal Grandmother   . Sleep apnea Brother     with [facility] estranged   History  Substance Use Topics  . Smoking status: Former Research scientist (life sciences)  . Smokeless tobacco: Never Used  . Alcohol Use: Yes     Comment: wine on occasion    OB History   Grav Para Term Preterm Abortions TAB SAB Ect Mult Living                 Review of Systems  Constitutional: Positive for diaphoresis. Negative for fever.  HENT: Negative for congestion.   Eyes: Negative for visual disturbance.  Respiratory: Positive for shortness of breath.   Cardiovascular: Negative for chest pain.  Gastrointestinal: Positive for abdominal pain. Negative for nausea and vomiting.  Genitourinary: Positive for pelvic pain.  Musculoskeletal: Negative for back pain.  Skin: Negative for rash.  Neurological: Negative for headaches.  Hematological: Does not bruise/bleed easily.  Psychiatric/Behavioral: Negative for confusion.      Allergies  Lisinopril and Penicillins  Home Medications   Current Outpatient Rx  Name  Route  Sig  Dispense  Refill  . amLODipine (NORVASC) 5 MG tablet   Oral   Take 5 mg by mouth daily.         Marland Kitchen  cetirizine (ZYRTEC) 10 MG tablet   Oral   Take 10 mg by mouth daily as needed.           . cholecalciferol (VITAMIN D) 1000 UNITS tablet   Oral   Take 1,000 Units by mouth daily.          . Multiple Vitamin (MULTIVITAMIN WITH MINERALS) TABS tablet   Oral   Take 1 tablet by mouth daily.         Marland Kitchen triamterene-hydrochlorothiazide (MAXZIDE-25) 37.5-25 MG per tablet   Oral   Take 1 tablet by mouth daily.         Marland Kitchen dicyclomine (BENTYL) 10 MG capsule   Oral   Take 1 capsule (10 mg total) by mouth 4 (four) times daily as needed.   30 capsule   1    BP 90/60  Pulse 124  Temp(Src) 99.7 F (37.6 C) (Other (Comment))  Resp 47  Ht 5\' 1"  (1.549 m)  Wt 280 lb (127.007 kg)  BMI 52.93 kg/m2   SpO2 96% Physical Exam  Nursing note and vitals reviewed. Constitutional: She is oriented to person, place, and time. She appears well-developed and well-nourished. She appears distressed.  HENT:  Head: Normocephalic and atraumatic.  Mouth/Throat: Oropharynx is clear and moist.  Eyes: Conjunctivae and EOM are normal. Pupils are equal, round, and reactive to light.  Neck: Normal range of motion.  Cardiovascular: Regular rhythm and normal heart sounds.   Tachycardic.  Pulmonary/Chest: Effort normal and breath sounds normal. No respiratory distress.  Abdominal: Soft. Bowel sounds are normal. She exhibits mass. There is tenderness. There is guarding.  Epigastric mass measuring 4 x 4 centimeters tender to palpation. Patient is obese. Mild tenderness to lower corner and left lower corner worse than right. No significant guarding.  Musculoskeletal: Normal range of motion.  Neurological: She is alert and oriented to person, place, and time. No cranial nerve deficit. She exhibits normal muscle tone. Coordination normal.  Skin: Skin is warm.    ED Course  Procedures (including critical care time) Labs Review Labs Reviewed  COMPREHENSIVE METABOLIC PANEL - Abnormal; Notable for the following:    Sodium 136 (*)    Chloride 91 (*)    Glucose, Bld 142 (*)    BUN 47 (*)    Creatinine, Ser 4.22 (*)    Albumin 3.0 (*)    AST 92 (*)    ALT 146 (*)    Total Bilirubin 1.6 (*)    GFR calc non Af Amer 11 (*)    GFR calc Af Amer 13 (*)    All other components within normal limits  CBC WITH DIFFERENTIAL - Abnormal; Notable for the following:    RBC 5.15 (*)    Neutrophils Relative % 84 (*)    Lymphocytes Relative 8 (*)    Neutro Abs 8.0 (*)    All other components within normal limits  URINALYSIS, ROUTINE W REFLEX MICROSCOPIC - Abnormal; Notable for the following:    Color, Urine AMBER (*)    APPearance TURBID (*)    Bilirubin Urine MODERATE (*)    Ketones, ur 15 (*)    Protein, ur 100 (*)     Nitrite POSITIVE (*)    Leukocytes, UA SMALL (*)    All other components within normal limits  PRO B NATRIURETIC PEPTIDE - Abnormal; Notable for the following:    Pro B Natriuretic peptide (BNP) 3000.0 (*)    All other components within normal limits  URINE MICROSCOPIC-ADD ON -  Abnormal; Notable for the following:    Squamous Epithelial / LPF MANY (*)    Bacteria, UA FEW (*)    Casts GRANULAR CAST (*)    All other components within normal limits  I-STAT CG4 LACTIC ACID, ED - Abnormal; Notable for the following:    Lactic Acid, Venous 3.27 (*)    All other components within normal limits  CULTURE, BLOOD (ROUTINE X 2)  CULTURE, BLOOD (ROUTINE X 2)  URINE CULTURE  CBC  I-STAT CG4 LACTIC ACID, ED   Results for orders placed during the hospital encounter of 07/31/13  COMPREHENSIVE METABOLIC PANEL      Result Value Ref Range   Sodium 136 (*) 137 - 147 mEq/L   Potassium 3.7  3.7 - 5.3 mEq/L   Chloride 91 (*) 96 - 112 mEq/L   CO2 22  19 - 32 mEq/L   Glucose, Bld 142 (*) 70 - 99 mg/dL   BUN 47 (*) 6 - 23 mg/dL   Creatinine, Ser 4.22 (*) 0.50 - 1.10 mg/dL   Calcium 9.2  8.4 - 10.5 mg/dL   Total Protein 8.1  6.0 - 8.3 g/dL   Albumin 3.0 (*) 3.5 - 5.2 g/dL   AST 92 (*) 0 - 37 U/L   ALT 146 (*) 0 - 35 U/L   Alkaline Phosphatase 89  39 - 117 U/L   Total Bilirubin 1.6 (*) 0.3 - 1.2 mg/dL   GFR calc non Af Amer 11 (*) >90 mL/min   GFR calc Af Amer 13 (*) >90 mL/min  CBC WITH DIFFERENTIAL      Result Value Ref Range   WBC 9.6  4.0 - 10.5 K/uL   RBC 5.15 (*) 3.87 - 5.11 MIL/uL   Hemoglobin 14.2  12.0 - 15.0 g/dL   HCT 40.4  36.0 - 46.0 %   MCV 78.4  78.0 - 100.0 fL   MCH 27.6  26.0 - 34.0 pg   MCHC 35.1  30.0 - 36.0 g/dL   RDW 14.1  11.5 - 15.5 %   Platelets 189  150 - 400 K/uL   Neutrophils Relative % 84 (*) 43 - 77 %   Lymphocytes Relative 8 (*) 12 - 46 %   Monocytes Relative 8  3 - 12 %   Eosinophils Relative 0  0 - 5 %   Basophils Relative 0  0 - 1 %   Neutro Abs 8.0 (*)  1.7 - 7.7 K/uL   Lymphs Abs 0.8  0.7 - 4.0 K/uL   Monocytes Absolute 0.8  0.1 - 1.0 K/uL   Eosinophils Absolute 0.0  0.0 - 0.7 K/uL   Basophils Absolute 0.0  0.0 - 0.1 K/uL   WBC Morphology MILD LEFT SHIFT (1-5% METAS, OCC MYELO, OCC BANDS)    URINALYSIS, ROUTINE W REFLEX MICROSCOPIC      Result Value Ref Range   Color, Urine AMBER (*) YELLOW   APPearance TURBID (*) CLEAR   Specific Gravity, Urine 1.027  1.005 - 1.030   pH 5.0  5.0 - 8.0   Glucose, UA NEGATIVE  NEGATIVE mg/dL   Hgb urine dipstick NEGATIVE  NEGATIVE   Bilirubin Urine MODERATE (*) NEGATIVE   Ketones, ur 15 (*) NEGATIVE mg/dL   Protein, ur 100 (*) NEGATIVE mg/dL   Urobilinogen, UA 1.0  0.0 - 1.0 mg/dL   Nitrite POSITIVE (*) NEGATIVE   Leukocytes, UA SMALL (*) NEGATIVE  PRO B NATRIURETIC PEPTIDE      Result Value Ref Range  Pro B Natriuretic peptide (BNP) 3000.0 (*) 0 - 125 pg/mL  URINE MICROSCOPIC-ADD ON      Result Value Ref Range   Squamous Epithelial / LPF MANY (*) RARE   WBC, UA 3-6  <3 WBC/hpf   RBC / HPF 0-2  <3 RBC/hpf   Bacteria, UA FEW (*) RARE   Casts GRANULAR CAST (*) NEGATIVE   Urine-Other AMORPHOUS URATES/PHOSPHATES    I-STAT CG4 LACTIC ACID, ED      Result Value Ref Range   Lactic Acid, Venous 3.27 (*) 0.5 - 2.2 mmol/L    Imaging Review Dg Chest Port 1 View  (if Code Sepsis Called)  07/31/2013   CLINICAL DATA:  Shortness of breath with chest pain.  EXAM: PORTABLE CHEST - 1 VIEW  COMPARISON:  Followup  FINDINGS: Low lung volumes. Enlarged cardiomediastinal silhouette, likely cardiomegaly. Mild vascular congestion. Small bilateral effusions are probable. No acute osseous findings.  IMPRESSION: Low lung volumes.  Cardiomegaly with early vascular congestion.   Electronically Signed   By: Rolla Flatten M.D.   On: 07/31/2013 12:03     EKG Interpretation None     CRITICAL CARE Performed by: Mervin Kung. Total critical care time: 45 Critical care time was exclusive of separately billable  procedures and treating other patients. Critical care was necessary to treat or prevent imminent or life-threatening deterioration. Critical care was time spent personally by me on the following activities: development of treatment plan with patient and/or surrogate as well as nursing, discussions with consultants, evaluation of patient's response to treatment, examination of patient, obtaining history from patient or surrogate, ordering and performing treatments and interventions, ordering and review of laboratory studies, ordering and review of radiographic studies, pulse oximetry and re-evaluation of patient's condition.  MDM   Final diagnoses:  Hypotension  Sepsis  Abdominal pain    Patient brought in by EMS. Complaint was abdominal pain epigastric area and lower portions of the abdomen. Patient is known to have a history of diabetic date July this. Patient states that the abdominal pain at the present starting last night. Patient upon arrival was hypotensive with blood pressure of 82 systolic tachycardic to 0000000. Is mentating fine. Patient denied any chest pain. Septic workup pursued thinking was probably an intra-abdominal process. Patient's blood pressure responded to 2 L of normal saline came up into the low 100s. Patient also felt better. CT scan of the abdomen is pending for further evaluation. There is no significant leukocytosis. Liver function test are significant for mild elevation of AST and ALT. And bilirubin of 1.6. Patient also is to have significant renal insufficiency potassium is normal below the creatinine is 4.22 BUN of 47. Patient's fluids have been decreased to 50 cc an hour since her blood pressure has been corrected. Patient was cultured and antibiotics were started. Once patient stabilized out CT scan of the abdomen was ordered. Patient has remained stable both mentally and based on her blood pressures. CT scan will determine whether is that her abdominal process. The patient  will require admission either way. Currently hypotension has been corrected. No evidence of significant urinary tract infection. As stated patient was cultured and started on antibiotics for septic shock the may represent an intra-abdominal process.  Suspect is an intra-abdominal process with this patient as the cause of the sepsis. Could be complication of diverticulitis. Also the epigastric mass is suggestive of a hernia could be incarcerated bowel in that area. CT will direct the admission.    Mervin Kung,  MD 07/31/13 1621

## 2013-07-31 NOTE — ED Provider Notes (Signed)
CSN: [id number]     Arrival date & time 07/31/13  1055 History   First MD Initiated Contact with Patient 07/31/13 1114     Chief Complaint  Patient presents with  . Code Sepsis     (Consider location/radiation/quality/duration/timing/severity/associated sxs/prior Treatment) HPI  Past Medical History  Diagnosis Date  . Diverticulosis of colon (without mention of hemorrhage)   . Pure hypercholesterolemia   . Other abnormal glucose   . Essential hypertension, benign   . IBS (irritable bowel syndrome)   . Obesity, unspecified   . Unspecified vitamin D deficiency   . Left knee pain 2012    eval by ortho - bone spurs and DJD, treated with steroid shot which helped temporarily   Past Surgical History  Procedure Laterality Date  . Cesarean section      due to BP elevation  . Colonoscopy  2000    Diverticuli-Dr. Jarold Motto   Family History  Problem Relation Age of Onset  . Hypertension Father   . Cancer Father     prostate  . Heart disease Father     CABG x 2  . Cancer Mother     Lung-quit smoking 20 years prior to Dx  . Hypertension Mother   . Diabetes      + family hx  . Drug abuse Brother   . Stroke Maternal Grandmother   . Sleep apnea Brother     with [facility] estranged   History  Substance Use Topics  . Smoking status: Former Games developer  . Smokeless tobacco: Never Used  . Alcohol Use: Yes     Comment: wine on occasion    OB History   Grav Para Term Preterm Abortions TAB SAB Ect Mult Living                 Review of Systems    Allergies  Lisinopril and Penicillins  Home Medications   Current Outpatient Rx  Name  Route  Sig  Dispense  Refill  . amLODipine (NORVASC) 5 MG tablet   Oral   Take 5 mg by mouth daily.         . cetirizine (ZYRTEC) 10 MG tablet   Oral   Take 10 mg by mouth daily as needed.           . cholecalciferol (VITAMIN D) 1000 UNITS tablet   Oral   Take 1,000 Units by mouth daily.          . Multiple Vitamin (MULTIVITAMIN WITH  MINERALS) TABS tablet   Oral   Take 1 tablet by mouth daily.         Marland Kitchen triamterene-hydrochlorothiazide (MAXZIDE-25) 37.5-25 MG per tablet   Oral   Take 1 tablet by mouth daily.         Marland Kitchen dicyclomine (BENTYL) 10 MG capsule   Oral   Take 1 capsule (10 mg total) by mouth 4 (four) times daily as needed.   30 capsule   1    BP 102/71  Pulse 122  Temp(Src) 98.8 F (37.1 C) (Oral)  Resp 50  Ht 5\' 1"  (1.549 m)  Wt 280 lb (127.007 kg)  BMI 52.93 kg/m2  SpO2 95% Physical Exam  ED Course  CRITICAL CARE Performed by: Blake Divine DAVID III Authorized by: Blake Divine DAVID III Total critical care time: 35 minutes Critical care time was exclusive of separately billable procedures and treating other patients. Critical care was necessary to treat or prevent imminent or life-threatening deterioration of the following  conditions: sepsis. Critical care was time spent personally by me on the following activities: development of treatment plan with patient or surrogate, discussions with consultants, evaluation of patient's response to treatment, examination of patient, obtaining history from patient or surrogate, ordering and performing treatments and interventions, ordering and review of laboratory studies, ordering and review of radiographic studies, pulse oximetry, re-evaluation of patient's condition and review of old charts. Subsequent provider of critical care: I assumed direction of critical care for this patient from another provider of my specialty.   (including critical care time) Labs Review Labs Reviewed  COMPREHENSIVE METABOLIC PANEL - Abnormal; Notable for the following:    Sodium 136 (*)    Chloride 91 (*)    Glucose, Bld 142 (*)    BUN 47 (*)    Creatinine, Ser 4.22 (*)    Albumin 3.0 (*)    AST 92 (*)    ALT 146 (*)    Total Bilirubin 1.6 (*)    GFR calc non Af Amer 11 (*)    GFR calc Af Amer 13 (*)    All other components within normal limits  CBC WITH  DIFFERENTIAL - Abnormal; Notable for the following:    RBC 5.15 (*)    Neutrophils Relative % 84 (*)    Lymphocytes Relative 8 (*)    Neutro Abs 8.0 (*)    All other components within normal limits  URINALYSIS, ROUTINE W REFLEX MICROSCOPIC - Abnormal; Notable for the following:    Color, Urine AMBER (*)    APPearance TURBID (*)    Bilirubin Urine MODERATE (*)    Ketones, ur 15 (*)    Protein, ur 100 (*)    Nitrite POSITIVE (*)    Leukocytes, UA SMALL (*)    All other components within normal limits  PRO B NATRIURETIC PEPTIDE - Abnormal; Notable for the following:    Pro B Natriuretic peptide (BNP) 3000.0 (*)    All other components within normal limits  URINE MICROSCOPIC-ADD ON - Abnormal; Notable for the following:    Squamous Epithelial / LPF MANY (*)    Bacteria, UA FEW (*)    Casts GRANULAR CAST (*)    All other components within normal limits  I-STAT CG4 LACTIC ACID, ED - Abnormal; Notable for the following:    Lactic Acid, Venous 3.27 (*)    All other components within normal limits  CULTURE, BLOOD (ROUTINE X 2)  CULTURE, BLOOD (ROUTINE X 2)  URINE CULTURE  CBC  I-STAT CG4 LACTIC ACID, ED   Imaging Review Ct Abdomen Pelvis Wo Contrast  07/31/2013   CLINICAL DATA:  Abdominal pain starting 2 nights ago. Pain located in the central upper abdomen and across the lower abdomen.  EXAM: CT ABDOMEN AND PELVIS WITHOUT CONTRAST  TECHNIQUE: Multidetector CT imaging of the abdomen and pelvis was performed following the standard protocol without intravenous contrast.  COMPARISON:  10/27/2007  FINDINGS: In the pelvis, where is a small collection that lies posterior and to the left of the sigmoid colon adjacent to the iliopsoas muscle. It measures approximately 5.5 cm in greatest transverse dimension. It is likely a peridiverticular abscess.  There is extensive diverticulosis of the colon. There is some thickening of the mid sigmoid colon near the presumed abscess. There are mild associated  inflammatory changes. There is some inflammatory change extends from a loop of small bowel in the left anterior pelvis superiorly and posteriorly.  There is small bowel dilation of the proximal mid small bowel.  The exact transition point is not definitive, but is suggested in the left anterior pelvis associated with the pelvic inflammation.  There is a small amount of ascites that primarily collects in the pelvis that is also that is seen adjacent to the liver and spleen.  There are multiple prominent, but not pathologically enlarged, mesenteric lymph nodes.  The heart is mildly enlarged. There is lung base atelectasis, greater on the left. A minimal left pleural effusion is present.  There is uptake, masslike area of relative hypoattenuation arising from the lateral segment of the left lobe of the liver. It measures 9.3 cm x 8.1 cm by 8.2 cm. There is some localized fluid adjacent to the left lobe of the liver and the lesser curvature of the stomach. No other liver lesion. Spleen is unremarkable. No convincing gallbladder abnormality. No bile duct dilation. Normal pancreas. No adrenal masses. Kidneys in ureters are unremarkable. Bladder is decompressed by a Foley catheter. Questionable right uterine fibroid. Uterus otherwise unremarkable.  There are degenerative changes noted along the spine, involving the SI joints and both hips. No osteoblastic or osteolytic lesions.  There are small mass or fluid collection measuring 2.5 cm in the anterior upper abdominal midline, within the deep subcutaneous fat. This is nonspecific. No evidence of an abdominal wall hernia.  IMPRESSION: 1. Partial small bowel obstruction. Transition point is not definitive, but appears to be in the left anterior pelvis where there are inflammatory changes. 2. There is a 5.5 cm collection in the left lower pelvis contiguous with the uterus. The patient has had diverticulitis in this location previously. This may reflect a peridiverticular  abscess. 3. Extensive diverticulosis throughout the colon. There is an area of apparent wall thickening of the mid sigmoid colon which would also support diverticulitis. 4. Small amount of ascites. There is focal fluid that collects between the left lobe of the liver and the stomach. There is a small mass or fluid collection in the subcutaneous fat of the upper abdominal midline measuring 2.5 cm x 2.1 cm. 5. There is a hypo attenuating mass-like lesion in the lateral segment of the left lobe of the liver measuring 9.3 cm in greatest dimension. 6. Given this combination of findings. Patient would benefit from a contrast enhanced CT, when the patient can tolerate this procedure or if renal function allows.   Electronically Signed   By: Lajean Manes M.D.   On: 07/31/2013 16:49   Dg Chest Port 1 View  (if Code Sepsis Called)  07/31/2013   CLINICAL DATA:  Shortness of breath with chest pain.  EXAM: PORTABLE CHEST - 1 VIEW  COMPARISON:  Followup  FINDINGS: Low lung volumes. Enlarged cardiomediastinal silhouette, likely cardiomegaly. Mild vascular congestion. Small bilateral effusions are probable. No acute osseous findings.  IMPRESSION: Low lung volumes.  Cardiomegaly with early vascular congestion.   Electronically Signed   By: Rolla Flatten M.D.   On: 07/31/2013 12:03     EKG Interpretation None      MDM   Final diagnoses:  Hypotension  Sepsis  Abdominal pain  Renal failure   Kerasin from Dr. Rogene Houston.  Patient's blood pressure deteriorated again after my social care. She was given additional IV fluids with some improvement. She required close monitoring of her respiratory status - vascular congestion seen on chest x-ray. Her CT scan showed multiple findings, including partial small bowel obstruction, possible intra-abdominal abscess, large liver mass.  Discussed findings with Dr. Brantley Stage (general surgery) who will evaluate. Admitted to critical care.  Houston Siren III, MD 07/31/13 [phone]

## 2013-07-31 NOTE — Progress Notes (Addendum)
ANTIBIOTIC CONSULT NOTE - INITIAL  Pharmacy Consult for Flagyl, Levaquin Indication: Intra-abdominal infection / UTI  Allergies  Allergen Reactions  . Lisinopril     REACTION: lethargy, cough.  . Penicillins     REACTION: LYMPH NODES SWELL    Patient Measurements: Height: 5\' 1"  (154.9 cm) Weight: 280 lb (127.007 kg) IBW/kg (Calculated) : 47.8 Adjusted Body Weight: n/a  Vital Signs: Temp: 101.9 F (38.8 C) (04/01 1113) Temp src: Rectal (04/01 1113) BP: 82/66 mmHg (04/01 1113) Pulse Rate: 125 (04/01 1113) Intake/Output from previous day:   Intake/Output from this shift:    Labs: No results found for this basename: WBC, HGB, PLT, LABCREA, CREATININE,  in the last 72 hours Estimated Creatinine Clearance: 79.8 ml/min (by C-G formula based on Cr of 1). No results found for this basename: VANCOTROUGH, VANCOPEAK, VANCORANDOM, GENTTROUGH, GENTPEAK, GENTRANDOM, TOBRATROUGH, TOBRAPEAK, TOBRARND, AMIKACINPEAK, AMIKACINTROU, AMIKACIN,  in the last 72 hours   Microbiology: No results found for this or any previous visit (from the past 720 hour(s)).  Medical History: Past Medical History  Diagnosis Date  . Diverticulosis of colon (without mention of hemorrhage)   . Pure hypercholesterolemia   . Other abnormal glucose   . Essential hypertension, benign   . IBS (irritable bowel syndrome)   . Obesity, unspecified   . Unspecified vitamin D deficiency   . Left knee pain 2012    eval by ortho - bone spurs and DJD, treated with steroid shot which helped temporarily    Medications:   (Not in a hospital admission) Assessment: 16 YOF with c/o abdominal pain. Pharmacy to start empiric antibiotics for sepsis due to suspected intra-abdominal infection. Temp 101.9. WBC wnl with slight left shift. UA positive for bacteria, WBC, leukocytes and nitrite.   Cultures: 4/1 Urine Cx >> 4/1 Blood Cx x2>>   Abx given in ED 1) Levaquin 750 mg IV x 1 dose 2) Metronidazole 500 mg IV x 1 dose    Goal of Therapy:  Resolution of infection   Plan:  1) Levaquin 500 mg IV Q 48 hours starting on 4/3  2) Metronidazole 500 mg IV Q 8 hours  3) F/u CBC, renal fx, cultures and patient's clinical progress  4) Will d/c Flagyl consult since no renal adjustments necessary.   Albertina Parr, PharmD.  Clinical Pharmacist Pager [phone]  Addendum: Also adding aztreonam & vancomycin for sepsis and discontinuing levaquin. Noted PCN allergy.  Plan: 1. Aztreonam 1gm IV Q8H 2. Vancomycin 2500mg  IV x 1 3. F/u renal fxn in AM for further vancomycin dosing 4. F/u renal fxn, C&S, clinical status and trough at [facility], PharmD, BCPS Pager # [phone] 07/31/2013 6:16 PM

## 2013-07-31 NOTE — ED Notes (Signed)
Pt return from ct scan. Dr Bartholome Bill at bedside. Family updated on condition. Pt is alert x4 Dr Bartholome Bill aware of output and bnp. 500 bolus started.

## 2013-07-31 NOTE — Progress Notes (Signed)
eLink Physician-Brief Progress Note Patient Name: Leah Peterson DOB: 1956-03-06 MRN: [id number]  Date of Service  07/31/2013   HPI/Events of Note     eICU Interventions  SCDs for DVT proph   Intervention Category Intermediate Interventions: Best-practice therapies (e.g. DVT, beta blocker, etc.)  [name] V. 07/31/2013, 9:27 PM

## 2013-07-31 NOTE — Telephone Encounter (Signed)
Noted. Pt in ICU with sepsis undergoing workup.

## 2013-08-01 ENCOUNTER — Inpatient Hospital Stay (HOSPITAL_COMMUNITY): Payer: BC Managed Care – PPO

## 2013-08-01 ENCOUNTER — Non-Acute Institutional Stay: Payer: BC Managed Care – PPO | Admitting: Internal Medicine

## 2013-08-01 DIAGNOSIS — R1906 Epigastric swelling, mass or lump: Secondary | ICD-10-CM

## 2013-08-01 DIAGNOSIS — A419 Sepsis, unspecified organism: Secondary | ICD-10-CM

## 2013-08-01 LAB — URINE CULTURE
Colony Count: NO GROWTH
Culture: NO GROWTH

## 2013-08-01 LAB — HEPATIC FUNCTION PANEL
ALK PHOS: 85 U/L (ref 39–117)
ALT: 129 U/L — AB (ref 0–35)
AST: 83 U/L — ABNORMAL HIGH (ref 0–37)
Albumin: 2.4 g/dL — ABNORMAL LOW (ref 3.5–5.2)
BILIRUBIN INDIRECT: 0.5 mg/dL (ref 0.3–0.9)
Bilirubin, Direct: 0.6 mg/dL — ABNORMAL HIGH (ref 0.0–0.3)
Total Bilirubin: 1.1 mg/dL (ref 0.3–1.2)
Total Protein: 6.7 g/dL (ref 6.0–8.3)

## 2013-08-01 LAB — HEPATITIS B CORE ANTIBODY, TOTAL: HEP B C TOTAL AB: NONREACTIVE

## 2013-08-01 LAB — CBC
HEMATOCRIT: 32 % — AB (ref 36.0–46.0)
Hemoglobin: 10.8 g/dL — ABNORMAL LOW (ref 12.0–15.0)
MCH: 26.5 pg (ref 26.0–34.0)
MCHC: 33.8 g/dL (ref 30.0–36.0)
MCV: 78.4 fL (ref 78.0–100.0)
Platelets: 156 10*3/uL (ref 150–400)
RBC: 4.08 MIL/uL (ref 3.87–5.11)
RDW: 14.5 % (ref 11.5–15.5)
WBC: 6.2 10*3/uL (ref 4.0–10.5)

## 2013-08-01 LAB — BASIC METABOLIC PANEL
BUN: 58 mg/dL — AB (ref 6–23)
BUN: 57 mg/dL — ABNORMAL HIGH (ref 6–23)
CALCIUM: 7.9 mg/dL — AB (ref 8.4–10.5)
CALCIUM: 8.2 mg/dL — AB (ref 8.4–10.5)
CHLORIDE: 98 meq/L (ref 96–112)
CO2: 20 meq/L (ref 19–32)
CO2: 20 mEq/L (ref 19–32)
Chloride: 98 mEq/L (ref 96–112)
Creatinine, Ser: 4.36 mg/dL — ABNORMAL HIGH (ref 0.50–1.10)
Creatinine, Ser: 2.92 mg/dL — ABNORMAL HIGH (ref 0.50–1.10)
GFR calc Af Amer: 12 mL/min — ABNORMAL LOW (ref >90)
GFR calc Af Amer: 20 mL/min — ABNORMAL LOW (ref >90)
GFR calc non Af Amer: 10 mL/min — ABNORMAL LOW (ref >90)
GFR, EST NON AFRICAN AMERICAN: 17 mL/min — AB (ref >90)
GLUCOSE: 97 mg/dL (ref 70–99)
Glucose, Bld: 111 mg/dL — ABNORMAL HIGH (ref 70–99)
Potassium: 3.4 mEq/L — ABNORMAL LOW (ref 3.7–5.3)
Potassium: 3.2 mEq/L — ABNORMAL LOW (ref 3.7–5.3)
Sodium: 138 mEq/L (ref 137–147)
Sodium: 137 mEq/L (ref 137–147)

## 2013-08-01 LAB — TROPONIN I
Troponin I: <0.3 ng/mL (ref <0.30)
Troponin I: <0.3 ng/mL (ref <0.30)

## 2013-08-01 LAB — LACTIC ACID, PLASMA
LACTIC ACID, VENOUS: 1.8 mmol/L (ref 0.5–2.2)
Lactic Acid, Venous: 1.1 mmol/L (ref 0.5–2.2)

## 2013-08-01 LAB — HEPATITIS C ANTIBODY: HCV AB: NEGATIVE

## 2013-08-01 LAB — CORTISOL: CORTISOL PLASMA: 30.9 ug/dL

## 2013-08-01 LAB — MAGNESIUM: Magnesium: 1.7 mg/dL (ref 1.5–2.5)

## 2013-08-01 LAB — TYPE AND SCREEN
ABO/RH(D): B POS
Antibody Screen: NEGATIVE

## 2013-08-01 LAB — ABO/RH: ABO/RH(D): B POS

## 2013-08-01 LAB — HIV ANTIBODY (ROUTINE TESTING W REFLEX): HIV: NONREACTIVE

## 2013-08-01 LAB — HEPATITIS B SURFACE ANTIGEN: HEP B S AG: NEGATIVE

## 2013-08-01 MED ORDER — POTASSIUM CHLORIDE 10 MEQ/100ML IV SOLN
10.0000 meq | INTRAVENOUS | Status: AC
  Administered 2013-08-01 (×2): 10 meq via INTRAVENOUS
  Filled 2013-08-01: qty 100

## 2013-08-01 MED ORDER — SODIUM CHLORIDE 0.9 % IJ SOLN: 9.0000 mL | INTRAMUSCULAR | Status: DC | PRN

## 2013-08-01 MED ORDER — PNEUMOCOCCAL VAC POLYVALENT 25 MCG/0.5ML IJ INJ
0.5000 mL | INJECTION | INTRAMUSCULAR | Status: AC
  Administered 2013-08-02: 0.5 mL via INTRAMUSCULAR
  Filled 2013-08-01: qty 0.5

## 2013-08-01 MED ORDER — DIPHENHYDRAMINE HCL 50 MG/ML IJ SOLN: 12.5000 mg | Freq: Four times a day (QID) | INTRAMUSCULAR | Status: DC | PRN

## 2013-08-01 MED ORDER — FENTANYL 10 MCG/ML IV SOLN
INTRAVENOUS | Status: DC
  Filled 2013-08-01: qty 50

## 2013-08-01 MED ORDER — NALOXONE HCL 0.4 MG/ML IJ SOLN: 0.4000 mg | INTRAMUSCULAR | Status: DC | PRN

## 2013-08-01 MED ORDER — FENTANYL 10 MCG/ML IV SOLN
INTRAVENOUS | Status: DC
Start: 2013-08-01 — End: 2013-08-05
  Administered 2013-08-01: 50 ug via INTRAVENOUS
  Administered 2013-08-01: 36 ug via INTRAVENOUS
  Administered 2013-08-02: 10 ug via INTRAVENOUS
  Administered 2013-08-02 (×2): 40 ug via INTRAVENOUS
  Administered 2013-08-02 – 2013-08-03 (×3): 10 ug via INTRAVENOUS
  Administered 2013-08-04: 19:00:00 via INTRAVENOUS
  Administered 2013-08-04: 30 ug via INTRAVENOUS
  Administered 2013-08-04: 50 ug via INTRAVENOUS
  Administered 2013-08-04: 30 ug via INTRAVENOUS
  Administered 2013-08-05: 50 ug via INTRAVENOUS
  Administered 2013-08-05: 30 ug via INTRAVENOUS
  Filled 2013-08-01 (×3): qty 50

## 2013-08-01 MED ORDER — ONDANSETRON HCL 4 MG/2ML IJ SOLN: 4.0000 mg | Freq: Four times a day (QID) | INTRAMUSCULAR | Status: DC | PRN

## 2013-08-01 MED ORDER — ACETAMINOPHEN 650 MG RE SUPP
650.0000 mg | Freq: Four times a day (QID) | RECTAL | Status: DC | PRN
  Administered 2013-08-01: 650 mg via RECTAL
  Filled 2013-08-01: qty 1

## 2013-08-01 MED ORDER — FENTANYL 10 MCG/ML IV SOLN
INTRAVENOUS | Status: DC
  Administered 2013-08-01: 16:00:00 via INTRAVENOUS
  Filled 2013-08-01: qty 50

## 2013-08-01 MED ORDER — FENTANYL 10 MCG/ML IV SOLN: INTRAVENOUS | Status: DC

## 2013-08-01 MED ORDER — DIPHENHYDRAMINE HCL 12.5 MG/5ML PO ELIX: 12.5000 mg | ORAL_SOLUTION | Freq: Four times a day (QID) | ORAL | Status: DC | PRN

## 2013-08-01 MED ORDER — DEXTROSE-NACL 5-0.45 % IV SOLN
INTRAVENOUS | Status: DC
  Administered 2013-08-01 – 2013-08-02 (×2): via INTRAVENOUS
  Administered 2013-08-04: 75 mL/h via INTRAVENOUS

## 2013-08-01 MED ORDER — DIPHENHYDRAMINE HCL 12.5 MG/5ML PO ELIX
12.5000 mg | ORAL_SOLUTION | Freq: Four times a day (QID) | ORAL | Status: DC | PRN
  Filled 2013-08-01: qty 5

## 2013-08-01 MED ORDER — ADENOSINE 6 MG/2ML IV SOLN
INTRAVENOUS | Status: AC
  Filled 2013-08-01: qty 2

## 2013-08-01 MED ORDER — SODIUM CHLORIDE 0.9 % IV BOLUS (SEPSIS): 500.0000 mL | INTRAVENOUS | Status: DC | PRN

## 2013-08-01 MED ORDER — FENTANYL CITRATE 0.05 MG/ML IJ SOLN: 25.0000 ug | INTRAMUSCULAR | Status: DC | PRN

## 2013-08-01 MED ORDER — CIPROFLOXACIN IN D5W 400 MG/200ML IV SOLN
400.0000 mg | INTRAVENOUS | Status: DC
  Administered 2013-08-01: 400 mg via INTRAVENOUS
  Filled 2013-08-01 (×2): qty 200

## 2013-08-01 MED ORDER — DIPHENHYDRAMINE HCL 12.5 MG/5ML PO ELIX
12.5000 mg | ORAL_SOLUTION | Freq: Four times a day (QID) | ORAL | Status: DC | PRN
Start: 2013-08-01 — End: 2013-08-01

## 2013-08-01 NOTE — Progress Notes (Signed)
Anerobic lab work on 07/31/13 called from [facility]; Gram + cocci pairs. Dr. Alva Garnet aware.

## 2013-08-01 NOTE — Progress Notes (Addendum)
PULMONARY / CRITICAL CARE MEDICINE   Name: Leah Peterson MRN: [id number] DOB: 08/03/1955    ADMISSION DATE:  07/31/2013 PRIMARY SERVICE: PCCM  CHIEF COMPLAINT:  Abdominal pain and diarrhea  BRIEF PATIENT DESCRIPTION: 58 year old with a PMH of diverticulitis (last treatment one year ago), HTN, IBS, and obesity who presents with abdominal pain and diarrhea. She is noted to be hypotensive with BP 80's/60's and lactic acid was mildly elevated at 3.27 upon arrival to the ED. Her abd CT showed diverticulitis and free fluid collection with largest one 9.3 cm at left lobe of liver.   SIGNIFICANT EVENTS / STUDIES:  4/2 [place] Abd: Solid mass in hepatic segment 2 of 10.3cm demonstrating internal vascularity. DDx includes hepatocellular carcinoma, metastatic disease, or benign neoplasms including focal nodular hyperplasia and adenoma. Adjacent subcapsular complex cystic structure with fluid levels favoring subcapsular hematoma-almost certainly related to the adjacent mass lesion. When pt clinically able, evaluation with MRI Abd w/wo contrast is recommended to further assess solid mass. 4/2 CT Abd/Pelvis: Partial SBO, transition point not definitive but appears in left anterior pelvis where there are inflammatory changes. Also, 5.5cm collection in left lower pelvis contiguous with uterus. Pt had diverticulosis in this location previously and may reflect peridiverticular abscess. Extensive diverticulosis with an area of wall thickening of mid sigmoid colon which could support diverticulitis. Small amt of ascites. There is focal fluid collection between left lobe of liver and stomach. Also a small mass/fluid collection in subcutaneous fat of upper abdominal midline measuring 2.5 x 2.1cm. There is a hypo attenuating mass-like lesion in lateral left lobe of liver measuring 9.3cm. Given this combination of findings, pt would benefit from contrast enhanced CT, when pt can tolerate and renal function allows.   4/2  Repeat CT Abd/Pelvis with rectal contrast: communication of the sigmoid colon lumen to a small and thick walled abscess with fistula demonstrated from the sigmoid colon lumen after administration of rectal contrast. The liquefied central portion of this abscess only measures 1.8 cm. Multitude of other areas of free fluid and loculated fluid are seen in the peritoneal cavity with the largest focal fluid in the gastrohepatic space under the left lobe of the liver. Persistent large mass in the left lobe of the liver with associated subcutaneous nodule which may represent metastatic  disease in the midline anterior subcutaneous fat.   LINES / TUBES: 4/1 NGT >>  4/2 R IJ CVL >>   CULTURES: BC 4/1>>1/2 gpc in pairs >>  UC 4/1>>NG   ANTIBIOTICS: Aztreonam 4/1>>4/2 Levaquin 4/1>>4/1 Metronidazole 4/1>>  Vancomycin 4/1>>  Cipro 4/2>>   SUBJECTIVE:   VITAL SIGNS: Temp:  [98.8 F (37.1 C)-101.2 F (38.4 C)] 99.8 F (37.7 C) (04/02 1300) Pulse Rate:  [95-128] 106 (04/02 1300) Resp:  [22-52] 34 (04/02 1300) BP: (85-120)/(52-90) 119/75 mmHg (04/02 1300) SpO2:  [89 %-98 %] 92 % (04/02 1300)  HEMODYNAMICS:   VENTILATOR SETTINGS:   INTAKE / OUTPUT: Intake/Output     04/01 0701 - 04/02 0700 04/02 0701 - 04/03 0700   I.V. (mL/kg) 4322.9 (34) 600 (4.7)   Other 500    NG/GT 60    IV Piggyback 800 300   Total Intake(mL/kg) 5682.9 (44.7) 900 (7.1)   Urine (mL/kg/hr) 155 272 (0.3)   Total Output 155 272   Net +5527.9 +628         PHYSICAL EXAMINATION: General:  RASS 0.  Neuro:   Moving all extremities.  HEENT:  PERRLA, EOMI.  Cardiovascular:  RRR,  no M/R/G Lungs:  CTAB Abdomen:  obese, +bs; soft, tender epigastric nodule Musculoskeletal:  no joint deformities Skin:  warm, dry; no rash  LABS: I have reviewed all the lab results.   CXR:  4/2 Low lung volumes, cardiomegaly.   ASSESSMENT / PLAN:  PULMONARY A: Acute hypoxic resp failure due to atx and/or mild acute lung  injury P:   Continue supplemental O2 as needed Monitor resp status in ICU  CARDIOVASCULAR A:  H/O hypertension P:  Monitor rhythm and BP Holding anti-hypertensives   RENAL A:  Acute Renal Failure - pressumed ATN due to severe sepsis P:   Monitor BMET intermittently Monitor I/Os Correct electrolytes as indicated  GASTROINTESTINAL A:  Diverticulitis Diverticulosis Liver mass/subq nodule in epigatrium Pelvic abscess - likely due to contained diverticular rupture pSBO P:   NGT to suction NPO Decision re: mgmt of abscess per CCS Will eventually need bx of liver mass and subq nodule  HEMATOLOGIC A:  Anemia P:  Monitor CBC intermittently  Transfuse per usual ICU guidelines VTE px: SCDs Holding anticoagulation  INFECTIOUS A:  Severe sepsis Pelvic abscess P:   Micro and abx as above  ENDOCRINE A:  DMII   HLD P:   Diet controlled-currently NPO  NEUROLOGIC A:  No acute issues P:   Monitor    PCCM ATTENDING: I have interviewed and examined the patient and reviewed the database. I have formulated the assessment and plan as reflected in the note above with amendments made by me.   Seen with [facility] Dr Gordy Levan.   Merton Border, MD;  PCCM service; Mobile [phone]

## 2013-08-01 NOTE — Progress Notes (Signed)
[place] Progress Note Patient Name: Leah Peterson DOB: [birth year] MRN: [id number]  Date of Service  08/01/2013   HPI/Events of Note   Fever, NG to LWS  eICU Interventions  APAP suppository   Intervention Category Major Interventions: Infection - evaluation and management  MCQUAID, DOUGLAS 08/01/2013, 6:38 PM

## 2013-08-01 NOTE — Procedures (Signed)
Central Venous Catheter Insertion Procedure Note Leah Peterson [id number] [birth year]  Procedure: Insertion of Central Venous Catheter Indications: Assessment of intravascular volume, Drug and/or fluid administration and Frequent blood sampling  Procedure Details Consent: Risks of procedure as well as the alternatives and risks of each were explained to the (patient/caregiver).  Consent for procedure obtained. Time Out: Verified patient identification, verified procedure, site/side was marked, verified correct patient position, special equipment/implants available, medications/allergies/relevent history reviewed, required imaging and test results available.  Performed  Maximum sterile technique was used including antiseptics, cap, gloves, gown, hand hygiene, mask and sheet. Skin prep: Chlorhexidine; local anesthetic administered A antimicrobial bonded/coated triple lumen catheter was placed in the right internal jugular vein using the Seldinger technique.  Evaluation Blood flow good Complications: No apparent complications Patient did tolerate procedure well. Chest X-ray ordered to verify placement.  CXR: normal.  Procedure performed under direct US guidance, guidewire visualized in vessel.   Montey Hora, PA - [facility] Pgr: [phone]  or [phone]   I was present for and supervised the entire procedure  Merton Border, MD ; [facility] service Mobile [phone].  After 5:30 PM or weekends, call [phone]

## 2013-08-01 NOTE — Progress Notes (Signed)
CCS/Jmya Uliano Progress Note    Subjective: Patient is awake and alert.  Still having abdominal pain.  Splinting and SOB.  Sats are okay.  Say pain is better as long as she does not move much  Objective: Vital signs in last 24 hours: Temp:  [98.8 F (37.1 C)-101.9 F (38.8 C)] 100 F (37.8 C) (04/02 0715) Pulse Rate:  [99-128] 105 (04/02 0715) Resp:  [22-52] 31 (04/02 0715) BP: (82-120)/(52-90) 106/68 mmHg (04/02 0700) SpO2:  [89 %-98 %] 98 % (04/02 0715) Weight:  [127.007 kg (280 lb)] 127.007 kg (280 lb) (04/01 1113)    Intake/Output from previous day: 04/01 0701 - 04/02 0700 In: 5482.9 [I.V.:4322.9; NG/GT:60; IV Piggyback:800] Out: 155 [Urine:155] Intake/Output this shift:    General: Moderate acute distress   Lungs: Clear but diminished in the bases  Abd: Distended with hypoactive bowel sounds.  Very tender palpable mass in the epigastric area, feels like an incarcerated hernia, but this was not mentioned in the CT report.  Extremities: Large, no clinical DVT  Neuro: Intact  Lab Results:  @LABLAST2 (wbc:2,hgb:2,hct:2,plt:2) BMET  Recent Labs  07/31/13 1100 08/01/13 0417  NA 136* 138  K 3.7 3.4*  CL 91* 98  CO2 22 20  GLUCOSE 142* 111*  BUN 47* 58*  CREATININE 4.22* 4.36*  CALCIUM 9.2 7.9*   PT/INR  Recent Labs  07/31/13 2320  LABPROT 18.7*  INR 1.61*   ABG No results found for this basename: PHART, PCO2, PO2, HCO3,  in the last 72 hours  Studies/Results: Ct Abdomen Pelvis Wo Contrast  07/31/2013   CLINICAL DATA:  Abdominal pain starting 2 nights ago. Pain located in the central upper abdomen and across the lower abdomen.  EXAM: CT ABDOMEN AND PELVIS WITHOUT CONTRAST  TECHNIQUE: Multidetector CT imaging of the abdomen and pelvis was performed following the standard protocol without intravenous contrast.  COMPARISON:  10/27/2007  FINDINGS: In the pelvis, where is a small collection that lies posterior and to the left of the sigmoid colon adjacent to the  iliopsoas muscle. It measures approximately 5.5 cm in greatest transverse dimension. It is likely a peridiverticular abscess.  There is extensive diverticulosis of the colon. There is some thickening of the mid sigmoid colon near the presumed abscess. There are mild associated inflammatory changes. There is some inflammatory change extends from a loop of small bowel in the left anterior pelvis superiorly and posteriorly.  There is small bowel dilation of the proximal mid small bowel. The exact transition point is not definitive, but is suggested in the left anterior pelvis associated with the pelvic inflammation.  There is a small amount of ascites that primarily collects in the pelvis that is also that is seen adjacent to the liver and spleen.  There are multiple prominent, but not pathologically enlarged, mesenteric lymph nodes.  The heart is mildly enlarged. There is lung base atelectasis, greater on the left. A minimal left pleural effusion is present.  There is uptake, masslike area of relative hypoattenuation arising from the lateral segment of the left lobe of the liver. It measures 9.3 cm x 8.1 cm by 8.2 cm. There is some localized fluid adjacent to the left lobe of the liver and the lesser curvature of the stomach. No other liver lesion. Spleen is unremarkable. No convincing gallbladder abnormality. No bile duct dilation. Normal pancreas. No adrenal masses. Kidneys in ureters are unremarkable. Bladder is decompressed by a Foley catheter. Questionable right uterine fibroid. Uterus otherwise unremarkable.  There are degenerative changes noted  along the spine, involving the SI joints and both hips. No osteoblastic or osteolytic lesions.  There are small mass or fluid collection measuring 2.5 cm in the anterior upper abdominal midline, within the deep subcutaneous fat. This is nonspecific. No evidence of an abdominal wall hernia.  IMPRESSION: 1. Partial small bowel obstruction. Transition point is not  definitive, but appears to be in the left anterior pelvis where there are inflammatory changes. 2. There is a 5.5 cm collection in the left lower pelvis contiguous with the uterus. The patient has had diverticulitis in this location previously. This may reflect a peridiverticular abscess. 3. Extensive diverticulosis throughout the colon. There is an area of apparent wall thickening of the mid sigmoid colon which would also support diverticulitis. 4. Small amount of ascites. There is focal fluid that collects between the left lobe of the liver and the stomach. There is a small mass or fluid collection in the subcutaneous fat of the upper abdominal midline measuring 2.5 cm x 2.1 cm. 5. There is a hypo attenuating mass-like lesion in the lateral segment of the left lobe of the liver measuring 9.3 cm in greatest dimension. 6. Given this combination of findings. Patient would benefit from a contrast enhanced CT, when the patient can tolerate this procedure or if renal function allows.   Electronically Signed   By: Lajean Manes M.D.   On: 07/31/2013 16:49   Dg Chest Port 1 View  (if Code Sepsis Called)  07/31/2013   CLINICAL DATA:  Shortness of breath with chest pain.  EXAM: PORTABLE CHEST - 1 VIEW  COMPARISON:  Followup  FINDINGS: Low lung volumes. Enlarged cardiomediastinal silhouette, likely cardiomegaly. Mild vascular congestion. Small bilateral effusions are probable. No acute osseous findings.  IMPRESSION: Low lung volumes.  Cardiomegaly with early vascular congestion.   Electronically Signed   By: Rolla Flatten M.D.   On: 07/31/2013 12:03   US Abdomen Limited Ruq  07/31/2013   CLINICAL DATA:  Septic, liver mass versus abscess  EXAM: US ABDOMEN LIMITED - RIGHT UPPER QUADRANT  COMPARISON:  CT abdomen/ pelvis 07/31/2013  FINDINGS: Gallbladder:  No gallstones or wall thickening visualized. No sonographic Murphy sign noted.  Common bile duct:  Diameter: Within normal limits at 1.8 mm  Liver:  Solid isoechoic mass in  the posterior aspect of hepatic segment 2 measures 10.3 x 7.3 x 9.5 cm. There is evidence of internal vascularity on color Doppler imaging. Inferior to the dominant mass, there is a complex cystic structure which is more superficial and likely subcapsular in location. This complex cystic structure measures 5.1 x 3.8 x 5.8 cm and demonstrates a layering fluid fluid level suspicious for hematocrit effect and the presence of blood products. No vascularity within the cystic abnormality. The remainder the liver is unremarkable.  IMPRESSION: 1. Solid mass lesion in hepatic segment 2 measures up to 10.3 cm in greatest dimension and demonstrates internal vascularity. Differential considerations include hepatocellular carcinoma, metastatic disease, and other benign neoplasms including focal nodular hyperplasia and adenoma. 2. Adjacent subcapsular complex cystic structure with fluid fluid levels is favored to reflect a subcapsular hematoma. This is almost certainly related to the adjacent mass lesion. When the patient is clinically able (able to hold respirations and maintain immobility) evaluation with MRI of the abdomen with and without contrast is recommended to further assess the solid mass.   Electronically Signed   By: Jacqulynn Cadet M.D.   On: 07/31/2013 22:12    Anti-infectives: Anti-infectives   Start  Dose/Rate Route Frequency Ordered Stop   08/02/13 1200  levofloxacin (LEVAQUIN) IVPB 500 mg  Status:  Discontinued     500 mg 100 mL/hr over 60 Minutes Intravenous Every 48 hours 07/31/13 1812 07/31/13 2002   07/31/13 2015  vancomycin (VANCOCIN) 2,500 mg in sodium chloride 0.9 % 500 mL IVPB     2,500 mg 250 mL/hr over 120 Minutes Intravenous  Once 07/31/13 2000 07/31/13 2346   07/31/13 2000  metroNIDAZOLE (FLAGYL) IVPB 500 mg     500 mg 100 mL/hr over 60 Minutes Intravenous Every 8 hours 07/31/13 1812     07/31/13 1830  aztreonam (AZACTAM) 1 g in dextrose 5 % 50 mL IVPB     1 g 100 mL/hr over 30  Minutes Intravenous Every 8 hours 07/31/13 1815     07/31/13 1130  levofloxacin (LEVAQUIN) IVPB 750 mg     750 mg 100 mL/hr over 90 Minutes Intravenous  Once 07/31/13 1123 07/31/13 1310   07/31/13 1130  metroNIDAZOLE (FLAGYL) IVPB 500 mg     500 mg 100 mL/hr over 60 Minutes Intravenous  Once 07/31/13 1123 07/31/13 1310      Assessment/Plan: s/p  Keep NPO The patient has a number of issues that need to be addressed: 1.  Acute diverticulitis with possible peridiverticular abscess--poorly characterized because patient not able to get contrast; 2.  Acute renal insufficiency, likely from sepsis, but not resolved; 3.  Hepatic lesion, not well characterized because of lack of contrast--?infectious or growth; 4.  Epigastric mass with possible bowel obstruction.  This does not appear to be incarcerated bowel, but need to review CT again with colleagues.  LOS: 1 day   [name]. Dahlia Bailiff, MD, FACS [phone] [facility] Surgery 08/01/2013

## 2013-08-01 NOTE — Progress Notes (Signed)
UR Completed.  Vergie Living [phone] 08/01/2013

## 2013-08-02 ENCOUNTER — Inpatient Hospital Stay (HOSPITAL_COMMUNITY): Payer: BC Managed Care – PPO

## 2013-08-02 DIAGNOSIS — K630 Abscess of intestine: Secondary | ICD-10-CM

## 2013-08-02 DIAGNOSIS — D490 Neoplasm of unspecified behavior of digestive system: Secondary | ICD-10-CM

## 2013-08-02 LAB — CBC
HCT: 32 % — ABNORMAL LOW (ref 36.0–46.0)
Hemoglobin: 11.1 g/dL — ABNORMAL LOW (ref 12.0–15.0)
MCH: 26.9 pg (ref 26.0–34.0)
MCHC: 34.7 g/dL (ref 30.0–36.0)
MCV: 77.5 fL — ABNORMAL LOW (ref 78.0–100.0)
PLATELETS: 143 10*3/uL — AB (ref 150–400)
RBC: 4.13 MIL/uL (ref 3.87–5.11)
RDW: 14.6 % (ref 11.5–15.5)
WBC: 6.1 10*3/uL (ref 4.0–10.5)

## 2013-08-02 LAB — VANCOMYCIN, RANDOM: Vancomycin Rm: 13.4 ug/mL

## 2013-08-02 LAB — COMPREHENSIVE METABOLIC PANEL
ALK PHOS: 66 U/L (ref 39–117)
ALT: 120 U/L — AB (ref 0–35)
AST: 87 U/L — AB (ref 0–37)
Albumin: 2 g/dL — ABNORMAL LOW (ref 3.5–5.2)
BUN: 49 mg/dL — ABNORMAL HIGH (ref 6–23)
CALCIUM: 7.8 mg/dL — AB (ref 8.4–10.5)
CO2: 20 meq/L (ref 19–32)
Chloride: 101 mEq/L (ref 96–112)
Creatinine, Ser: 2.44 mg/dL — ABNORMAL HIGH (ref 0.50–1.10)
GFR calc Af Amer: 25 mL/min — ABNORMAL LOW (ref >90)
GFR calc non Af Amer: 21 mL/min — ABNORMAL LOW (ref >90)
GLUCOSE: 115 mg/dL — AB (ref 70–99)
POTASSIUM: 3.2 meq/L — AB (ref 3.7–5.3)
Sodium: 137 mEq/L (ref 137–147)
TOTAL PROTEIN: 6.3 g/dL (ref 6.0–8.3)
Total Bilirubin: 0.9 mg/dL (ref 0.3–1.2)

## 2013-08-02 LAB — PROTIME-INR
INR: 1.61 — ABNORMAL HIGH (ref 0.00–1.49)
Prothrombin Time: 18.7 seconds — ABNORMAL HIGH (ref 11.6–15.2)

## 2013-08-02 LAB — PHOSPHORUS: Phosphorus: 4.1 mg/dL (ref 2.3–4.6)

## 2013-08-02 LAB — BASIC METABOLIC PANEL
BUN: 43 mg/dL — AB (ref 6–23)
CALCIUM: 7.9 mg/dL — AB (ref 8.4–10.5)
CO2: 21 mEq/L (ref 19–32)
Chloride: 105 mEq/L (ref 96–112)
Creatinine, Ser: 1.94 mg/dL — ABNORMAL HIGH (ref 0.50–1.10)
GFR, EST AFRICAN AMERICAN: 32 mL/min — AB (ref >90)
GFR, EST NON AFRICAN AMERICAN: 28 mL/min — AB (ref >90)
GLUCOSE: 113 mg/dL — AB (ref 70–99)
Potassium: 3.4 mEq/L — ABNORMAL LOW (ref 3.7–5.3)
Sodium: 141 mEq/L (ref 137–147)

## 2013-08-02 LAB — MAGNESIUM: Magnesium: 1.9 mg/dL (ref 1.5–2.5)

## 2013-08-02 MED ORDER — POTASSIUM CHLORIDE 10 MEQ/100ML IV SOLN: 10.0000 meq | INTRAVENOUS | Status: DC

## 2013-08-02 MED ORDER — POTASSIUM CHLORIDE 10 MEQ/50ML IV SOLN
10.0000 meq | INTRAVENOUS | Status: AC
  Administered 2013-08-02 (×2): 10 meq via INTRAVENOUS
  Filled 2013-08-02: qty 50

## 2013-08-02 MED ORDER — VANCOMYCIN HCL 10 G IV SOLR
1750.0000 mg | INTRAVENOUS | Status: DC
  Administered 2013-08-02 – 2013-08-03 (×2): 1750 mg via INTRAVENOUS
  Filled 2013-08-02 (×3): qty 1750

## 2013-08-02 MED ORDER — CIPROFLOXACIN IN D5W 400 MG/200ML IV SOLN
400.0000 mg | Freq: Two times a day (BID) | INTRAVENOUS | Status: DC
  Administered 2013-08-02 – 2013-08-06 (×10): 400 mg via INTRAVENOUS
  Filled 2013-08-02 (×12): qty 200

## 2013-08-02 MED ORDER — POTASSIUM CHLORIDE 10 MEQ/100ML IV SOLN
10.0000 meq | INTRAVENOUS | Status: AC
  Administered 2013-08-02 (×2): 10 meq via INTRAVENOUS
  Filled 2013-08-02 (×2): qty 100

## 2013-08-02 NOTE — Progress Notes (Addendum)
PULMONARY / CRITICAL CARE MEDICINE   Name: Leah Peterson MRN: [id number] DOB: [birth year]    ADMISSION DATE:  07/31/2013 PRIMARY SERVICE: PCCM LOS 2 days   CHIEF COMPLAINT:  Abdominal pain and diarrhea  BRIEF PATIENT DESCRIPTION: 35 yof with a PMH of diverticulitis (last treatment one year ago), HTN, IBS, and obesity who presents with abdominal pain and diarrhea noted to be hypotensive in ED with BP 80's/60's and mildly elevated lactic acid at 3.27.  Abd CT showed diverticulitis and free fluid collection with largest one 9.3 cm at left lobe of liver.   SIGNIFICANT EVENTS / STUDIES:  4/2 [place] Abd: Solid mass in hepatic segment 2 of 10.3cm demonstrating internal vascularity. DDx includes hepatocellular carcinoma, metastatic disease, or benign neoplasms including focal nodular hyperplasia and adenoma. Adjacent subcapsular complex cystic structure with fluid levels favoring subcapsular hematoma-almost certainly related to the adjacent mass lesion. When pt clinically able, evaluation with MRI Abd w/wo contrast is recommended to further assess solid mass.  4/2 CT Abd/Pelvis: Partial SBO, transition point not definitive but appears in left anterior pelvis where there are inflammatory changes. Also, 5.5cm collection in left lower pelvis contiguous with uterus. Pt had diverticulosis in this location previously and may reflect peridiverticular abscess. Extensive diverticulosis with an area of wall thickening of mid sigmoid colon which could support diverticulitis. Small amt of ascites. There is focal fluid collection between left lobe of liver and stomach. Also a small mass/fluid collection in subcutaneous fat of upper abdominal midline measuring 2.5 x 2.1cm. There is a hypo attenuating mass-like lesion in lateral left lobe of liver measuring 9.3cm. Given this combination of findings, pt would benefit from contrast enhanced CT, when pt can tolerate and renal function allows.   4/2 Repeat CT Abd/Pelvis with  rectal contrast: communication of the sigmoid colon lumen to a small and thick walled abscess with fistula demonstrated from the sigmoid colon lumen after administration of rectal contrast. The liquefied central portion of this abscess only measures 1.8 cm. Multitude of other areas of free fluid and loculated fluid are seen in the peritoneal cavity with the largest focal fluid in the gastrohepatic space under the left lobe of the liver. Persistent large mass in the left lobe of the liver with associated subcutaneous nodule which may represent metastatic  disease in the midline anterior subcutaneous fat.   LINES / TUBES: 4/1 NGT >>  4/2 R IJ CVL >>   CULTURES: BC 4/1>>1/2 gpc in pairs, 2/2 gpr>>  UC 4/1>>NG   ANTIBIOTICS: Aztreonam 4/1>>4/2 Levaquin 4/1>>4/1 Metronidazole 4/1>>  Vancomycin 4/1>>  Cipro 4/2>>   SUBJECTIVE:  Pt reports pain has improved this AM 5/10 with the PCA.  Otherwise no acute complaints. (staff MD note: patient c/o epigastric tenderness and also wants to know when IR will place drain)  VITAL SIGNS: Temp:  [99.3 F (37.4 C)-101.9 F (38.8 C)] 99.6 F (37.6 C) (04/03 1000) Pulse Rate:  [91-112] 92 (04/03 1000) Resp:  [22-39] 32 (04/03 1000) BP: (91-123)/(37-80) 123/78 mmHg (04/03 1000) SpO2:  [90 %-98 %] 90 % (04/03 1000)  HEMODYNAMICS: CVP:  [4 mmHg-13 mmHg] 13 mmHg VENTILATOR SETTINGS:   INTAKE / OUTPUT: Intake/Output     04/02 0701 - 04/03 0700 04/03 0701 - 04/04 0700   I.V. (mL/kg) 2905 (22.9) 305.6 (2.4)   Other     NG/GT 50    IV Piggyback 1100 100   Total Intake(mL/kg) 4055 (31.9) 405.6 (3.2)   Urine (mL/kg/hr) 1422 (0.5) 425 (0.8)   Emesis/NG  output 450 (0.1)    Total Output 1872 425   Net +2183 -19.4         PHYSICAL EXAMINATION: General:  NAD.  Neuro:   Moving all extremities.  HEENT:  PERRLA, EOMI.  Cardiovascular:  RRR, no M/R/G Lungs:  CTAB Abdomen:  obese, +bs; soft, tender epigastric nodule ? SISTER MARY JOSEPH  NODULE Musculoskeletal:  no joint deformities Skin:  warm, dry; no rash  LABS:  PULMONARY No results found for this basename: PHART, PCO2, PCO2ART, PO2, PO2ART, HCO3, TCO2, O2SAT,  in the last 168 hours  CBC  Recent Labs Lab 07/31/13 1100 08/01/13 0417 08/02/13 0300  HGB 14.2 10.8* 11.1*  HCT 40.4 32.0* 32.0*  WBC 9.6 6.2 6.1  PLT 189 156 143*    COAGULATION  Recent Labs Lab 07/31/13 2320 08/02/13 1010  INR 1.61* 1.61*    CARDIAC   Recent Labs Lab 07/31/13 2320 08/01/13 0417  TROPONINI <0.30 <0.30    Recent Labs Lab 07/31/13 1100  PROBNP 3000.0*     CHEMISTRY  Recent Labs Lab 07/31/13 1100 08/01/13 0417 08/01/13 1835 08/02/13 0300 08/02/13 1010  NA 136* 138 137 137 141  K 3.7 3.4* 3.2* 3.2* 3.4*  CL 91* 98 98 101 105  CO2 22 20 20 20 21   GLUCOSE 142* 111* 97 115* 113*  BUN 47* 58* 57* 49* 43*  CREATININE 4.22* 4.36* 2.92* 2.44* 1.94*  CALCIUM 9.2 7.9* 8.2* 7.8* 7.9*  MG  --  1.7  --  1.9  --   PHOS  --   --   --  4.1  --    Estimated Creatinine Clearance: 41.1 ml/min (by C-G formula based on Cr of 1.94).   LIVER  Recent Labs Lab 07/31/13 1100 07/31/13 2320 08/02/13 0300 08/02/13 1010  AST 92* 83* 87*  --   ALT 146* 129* 120*  --   ALKPHOS 89 85 66  --   BILITOT 1.6* 1.1 0.9  --   PROT 8.1 6.7 6.3  --   ALBUMIN 3.0* 2.4* 2.0*  --   INR  --  1.61*  --  1.61*     INFECTIOUS  Recent Labs Lab 07/31/13 1139 07/31/13 2320 08/01/13 0417  LATICACIDVEN 3.27* 1.8 1.1     ENDOCRINE CBG (last 3)   Recent Labs  07/31/13 2102  GLUCAP 104*    IMAGING x48h  Ct Abdomen Pelvis Wo Contrast  08/01/2013   CLINICAL DATA:  Sepsis, possible sigmoid diverticular abscess and hepatic mass. CT performed yesterday demonstrated probable abscess in the left pelvis. Additional CT is performed to try to delineate etiology.  EXAM: CT ABDOMEN AND PELVIS WITHOUT CONTRAST  TECHNIQUE: Multidetector CT imaging of the abdomen and pelvis was  performed following the standard protocol without IV contrast.  COMPARISON:  CT ABD/PELV WO CM dated 07/31/2013; US ABDOMEN LIMITED RUQ/ASCITES dated 07/31/2013  FINDINGS: CT was performed in a supine position of the abdomen pelvis initially without contrast material. Repeat imaging was then performed after administration of water soluble rectal contrast.  Initial imaging demonstrates transit of oral contrast from yesterday's CT into the colon with contrast reaching the splenic flexure. After administration of rectal contrast, there is evidence of a definite communication from the sigmoid colon into a a thick walled abscess cavity of the left pelvis which abuts the left adnexal region. Oral contrast is seen in this cavity. The liquefied central fluid component is quite small, measuring 1.8 cm in diameter. Diverticulosis of the sigmoid and descending colon  is present.  Subcutaneous mass of the anterior midline abdomen is again demonstrated measuring up to 2.9 cm in diameter and likely representing a metastatic nodule/lymph node. A large left lobe hepatic mass is again visualized measuring 11 cm in greatest diameter.  A multitude of loculated fluid collections are also again identified. Gastrohepatic fluid along the undersurface of the left lobe of the liver measures approximately 7 cm. Multiple loculations of fluid are present in the mesenteric. Fluid extends into the pelvis with the pelvic component more consistent with non loculated pelvic fluid.  IMPRESSION: 1. There is communication of the sigmoid colon lumen to a small and thick walled abscess with fistula demonstrated from the sigmoid colon lumen after administration of rectal contrast. The liquefied central portion of this abscess only measures 1.8 cm. 2. Multitude of other areas of free fluid and loculated fluid are seen in the peritoneal cavity with the largest focal fluid in the gastrohepatic space under the left lobe of the liver. 3. Persistent large mass in  the left lobe of the liver with associated subcutaneous nodule which may represent metastatic disease in the midline anterior subcutaneous fat.   Electronically Signed   By: Aletta Edouard M.D.   On: 08/01/2013 17:50   Ct Abdomen Pelvis Wo Contrast  07/31/2013   CLINICAL DATA:  Abdominal pain starting 2 nights ago. Pain located in the central upper abdomen and across the lower abdomen.  EXAM: CT ABDOMEN AND PELVIS WITHOUT CONTRAST  TECHNIQUE: Multidetector CT imaging of the abdomen and pelvis was performed following the standard protocol without intravenous contrast.  COMPARISON:  10/27/2007  FINDINGS: In the pelvis, where is a small collection that lies posterior and to the left of the sigmoid colon adjacent to the iliopsoas muscle. It measures approximately 5.5 cm in greatest transverse dimension. It is likely a peridiverticular abscess.  There is extensive diverticulosis of the colon. There is some thickening of the mid sigmoid colon near the presumed abscess. There are mild associated inflammatory changes. There is some inflammatory change extends from a loop of small bowel in the left anterior pelvis superiorly and posteriorly.  There is small bowel dilation of the proximal mid small bowel. The exact transition point is not definitive, but is suggested in the left anterior pelvis associated with the pelvic inflammation.  There is a small amount of ascites that primarily collects in the pelvis that is also that is seen adjacent to the liver and spleen.  There are multiple prominent, but not pathologically enlarged, mesenteric lymph nodes.  The heart is mildly enlarged. There is lung base atelectasis, greater on the left. A minimal left pleural effusion is present.  There is uptake, masslike area of relative hypoattenuation arising from the lateral segment of the left lobe of the liver. It measures 9.3 cm x 8.1 cm by 8.2 cm. There is some localized fluid adjacent to the left lobe of the liver and the lesser  curvature of the stomach. No other liver lesion. Spleen is unremarkable. No convincing gallbladder abnormality. No bile duct dilation. Normal pancreas. No adrenal masses. Kidneys in ureters are unremarkable. Bladder is decompressed by a Foley catheter. Questionable right uterine fibroid. Uterus otherwise unremarkable.  There are degenerative changes noted along the spine, involving the SI joints and both hips. No osteoblastic or osteolytic lesions.  There are small mass or fluid collection measuring 2.5 cm in the anterior upper abdominal midline, within the deep subcutaneous fat. This is nonspecific. No evidence of an abdominal wall hernia.  IMPRESSION: 1. Partial small bowel obstruction. Transition point is not definitive, but appears to be in the left anterior pelvis where there are inflammatory changes. 2. There is a 5.5 cm collection in the left lower pelvis contiguous with the uterus. The patient has had diverticulitis in this location previously. This may reflect a peridiverticular abscess. 3. Extensive diverticulosis throughout the colon. There is an area of apparent wall thickening of the mid sigmoid colon which would also support diverticulitis. 4. Small amount of ascites. There is focal fluid that collects between the left lobe of the liver and the stomach. There is a small mass or fluid collection in the subcutaneous fat of the upper abdominal midline measuring 2.5 cm x 2.1 cm. 5. There is a hypo attenuating mass-like lesion in the lateral segment of the left lobe of the liver measuring 9.3 cm in greatest dimension. 6. Given this combination of findings. Patient would benefit from a contrast enhanced CT, when the patient can tolerate this procedure or if renal function allows.   Electronically Signed   By: Lajean Manes M.D.   On: 07/31/2013 16:49   Dg Chest Port 1 View  08/02/2013   CLINICAL DATA:  Severe sepsis with septic shock. Acute renal failure. Acute respiratory failure.  EXAM: PORTABLE CHEST -  1 VIEW  COMPARISON:  08/01/2013  FINDINGS: Persistent low lung volumes seen. Mild decrease in bibasilar atelectasis. Marland Kitchen Heart size is stable. No evidence of pulmonary consolidation or pleural effusion. Right jugular central venous catheter and nasogastric tube remain in appropriate position.  IMPRESSION: Mild decrease in bibasilar atelectasis.   Electronically Signed   By: Earle Gell M.D.   On: 08/02/2013 08:06   Dg Chest Portable 1 View  08/01/2013   CLINICAL DATA:  Line placement.  EXAM: PORTABLE CHEST - 1 VIEW  COMPARISON:  Chest 07/31/2013.  FINDINGS: The patient has a new right IJ catheter with tip projecting over the lower superior vena cava. NG tube courses into the stomach and below the inferior margin of the film. Lung volumes are low but the lungs are clear. There is no pneumothorax pleural effusion. Cardiomegaly is noted.  IMPRESSION: Tip of right IJ catheter projects over the lower superior vena cava. No pneumothorax.  NG tube courses into the stomach and below the inferior margin of the film.  Cardiomegaly.  Lungs clear.   Electronically Signed   By: Inge Rise M.D.   On: 08/01/2013 13:20   Dg Chest Port 1 View  (if Code Sepsis Called)  07/31/2013   CLINICAL DATA:  Shortness of breath with chest pain.  EXAM: PORTABLE CHEST - 1 VIEW  COMPARISON:  Followup  FINDINGS: Low lung volumes. Enlarged cardiomediastinal silhouette, likely cardiomegaly. Mild vascular congestion. Small bilateral effusions are probable. No acute osseous findings.  IMPRESSION: Low lung volumes.  Cardiomegaly with early vascular congestion.   Electronically Signed   By: Rolla Flatten M.D.   On: 07/31/2013 12:03   US Abdomen Limited Ruq  07/31/2013   CLINICAL DATA:  Septic, liver mass versus abscess  EXAM: US ABDOMEN LIMITED - RIGHT UPPER QUADRANT  COMPARISON:  CT abdomen/ pelvis 07/31/2013  FINDINGS: Gallbladder:  No gallstones or wall thickening visualized. No sonographic Murphy sign noted.  Common bile duct:  Diameter:  Within normal limits at 1.8 mm  Liver:  Solid isoechoic mass in the posterior aspect of hepatic segment 2 measures 10.3 x 7.3 x 9.5 cm. There is evidence of internal vascularity on color Doppler imaging. Inferior to the  dominant mass, there is a complex cystic structure which is more superficial and likely subcapsular in location. This complex cystic structure measures 5.1 x 3.8 x 5.8 cm and demonstrates a layering fluid fluid level suspicious for hematocrit effect and the presence of blood products. No vascularity within the cystic abnormality. The remainder the liver is unremarkable.  IMPRESSION: 1. Solid mass lesion in hepatic segment 2 measures up to 10.3 cm in greatest dimension and demonstrates internal vascularity. Differential considerations include hepatocellular carcinoma, metastatic disease, and other benign neoplasms including focal nodular hyperplasia and adenoma. 2. Adjacent subcapsular complex cystic structure with fluid fluid levels is favored to reflect a subcapsular hematoma. This is almost certainly related to the adjacent mass lesion. When the patient is clinically able (able to hold respirations and maintain immobility) evaluation with MRI of the abdomen with and without contrast is recommended to further assess the solid mass.   Electronically Signed   By: Jacqulynn Cadet M.D.   On: 07/31/2013 22:12    ASSESSMENT / PLAN:  PULMONARY A:  Acute hypoxic resp failure due to atx and/or mild acute lung injury   - stable off vent. Pulse ox 91% on [place] 3-4L [place]. Mild tachypnea due to abd distension (staff md note)  P:   Continue supplemental O2 as needed   CARDIOVASCULAR A:  H/O hypertension P:  Monitor rhythm and BP Holding anti-hypertensives   RENAL A:   Acute Renal Failure - pressumed ATN due to severe sepsis  - improving with fluids (staff note, no acidosis) P:   Monitor BMET intermittently Monitor I/Os Correct electrolytes as indicated  GASTROINTESTINAL A:    Diverticulitis with Diverticulosis , Pelvic abscess - likely due to contained diverticular rupture and SBO Liver mass/subq nodule in epigastrium - LIKELY SISTER JOSEPH NODULE reflecting GI or GYN  cancer origin   P:   NGT to suction NPO Diverticular abscess per [facility] - will see if IR can drain; continue bowel rest, abx Trend LFTs Will eventually need bx of liver mass and subq nodule  HEMATOLOGIC A:   Anemia -hgb 11.1, not bleeding acutely  P:  Monitor CBC intermittently  Transfuse per usual ICU guidelines for hgb < 7gm% VTE px: SCDs Holding anticoagulation  INFECTIOUS A:   Severe sepsis Pelvic abscess P:   Micro and abx as above  ENDOCRINE A:   DMII -last HA1C 6.6  HLD P:   Diet controlled-currently NPO  NEUROLOGIC A:  No acute issues P:   Monitor   Michail Jewels, MD Internal Medicine Teaching Service, PGY-1  STAFF NOTE Seen patient. Not intubated. Off presors. Improving renal failure. Does not seem at imminent risk for itnubation. Updated her and family member. Move to SDU under [place] for pickl up 08/03/13. Rest per  Resident    Dr. Brand Males, M.D., [facility].C.P Pulmonary and Critical Care Medicine Staff Physician [place] [facility] Pager: [phone], If no answer or between  15:00h - 7:00h: call [phone]  08/02/2013 12:19 PM

## 2013-08-02 NOTE — Progress Notes (Signed)
CRITICAL VALUE ALERT  Critical value received:  Gram + Rods  Date of notification:  08/02/2013   Time of notification:  0200  Critical value read back: yes   Nurse who received alert:  Allene Pyo   MD notified (1st page): Dr. Redmond Pulling  Time of first page:  0205  MD notified (2nd page):  Time of second page:  Responding MD:  Dr. Redmond Pulling   Time MD responded:  [phone]

## 2013-08-02 NOTE — Progress Notes (Signed)
Dr. Annamaria Boots has spoke with Dr. Grandville Silos today regarding diverticular abscess drain request. Per Dr. Annamaria Boots diverticular abscess is not amendable to percutaneous drain, subhepatic collection could be considered for percutaneous approach if needed, IR will await surgery recommendations.  Tsosie Billing PA-C Interventional Radiology  08/02/13  1:26 PM

## 2013-08-02 NOTE — Progress Notes (Signed)
Subjective: crampy lower abdominal pain  Objective: Vital signs in last 24 hours: Temp:  [99.3 F (37.4 C)-101.9 F (38.8 C)] 99.4 F (37.4 C) (04/03 0800) Pulse Rate:  [91-112] 92 (04/03 0800) Resp:  [22-40] 28 (04/03 0800) BP: (91-119)/(37-80) 119/70 mmHg (04/03 0800) SpO2:  [90 %-98 %] 92 % (04/03 0800) Last BM Date: 07/28/13  Intake/Output from previous day: 04/02 0701 - 04/03 0700 In: 4055 [I.V.:2905; NG/GT:50; IV Piggyback:1100] Out: 1872 [Urine:1422; Emesis/NG output:450] Intake/Output this shift: Total I/O In: 200 [I.V.:100; IV Piggyback:100] Out: 250 [Urine:250]  General appearance: cooperative Resp: clear to auscultation bilaterally Cardio: RRR 100 GI: soft, no sig lower abdominal tenderness, tender subcut mass epigastrium  Lab Results:   Recent Labs  08/01/13 0417 08/02/13 0300  WBC 6.2 6.1  HGB 10.8* 11.1*  HCT 32.0* 32.0*  PLT 156 143*   BMET  Recent Labs  08/01/13 1835 08/02/13 0300  NA 137 137  K 3.2* 3.2*  CL 98 101  CO2 20 20  GLUCOSE 97 115*  BUN 57* 49*  CREATININE 2.92* 2.44*  CALCIUM 8.2* 7.8*   PT/INR  Recent Labs  07/31/13 2320  LABPROT 18.7*  INR 1.61*   ABG No results found for this basename: PHART, PCO2, PO2, HCO3,  in the last 72 hours  Studies/Results: Ct Abdomen Pelvis Wo Contrast  08/01/2013   CLINICAL DATA:  Sepsis, possible sigmoid diverticular abscess and hepatic mass. CT performed yesterday demonstrated probable abscess in the left pelvis. Additional CT is performed to try to delineate etiology.  EXAM: CT ABDOMEN AND PELVIS WITHOUT CONTRAST  TECHNIQUE: Multidetector CT imaging of the abdomen and pelvis was performed following the standard protocol without IV contrast.  COMPARISON:  CT ABD/PELV WO CM dated 07/31/2013; US ABDOMEN LIMITED RUQ/ASCITES dated 07/31/2013  FINDINGS: CT was performed in a supine position of the abdomen pelvis initially without contrast material. Repeat imaging was then performed after  administration of water soluble rectal contrast.  Initial imaging demonstrates transit of oral contrast from yesterday's CT into the colon with contrast reaching the splenic flexure. After administration of rectal contrast, there is evidence of a definite communication from the sigmoid colon into a a thick walled abscess cavity of the left pelvis which abuts the left adnexal region. Oral contrast is seen in this cavity. The liquefied central fluid component is quite small, measuring 1.8 cm in diameter. Diverticulosis of the sigmoid and descending colon is present.  Subcutaneous mass of the anterior midline abdomen is again demonstrated measuring up to 2.9 cm in diameter and likely representing a metastatic nodule/lymph node. A large left lobe hepatic mass is again visualized measuring 11 cm in greatest diameter.  A multitude of loculated fluid collections are also again identified. Gastrohepatic fluid along the undersurface of the left lobe of the liver measures approximately 7 cm. Multiple loculations of fluid are present in the mesenteric. Fluid extends into the pelvis with the pelvic component more consistent with non loculated pelvic fluid.  IMPRESSION: 1. There is communication of the sigmoid colon lumen to a small and thick walled abscess with fistula demonstrated from the sigmoid colon lumen after administration of rectal contrast. The liquefied central portion of this abscess only measures 1.8 cm. 2. Multitude of other areas of free fluid and loculated fluid are seen in the peritoneal cavity with the largest focal fluid in the gastrohepatic space under the left lobe of the liver. 3. Persistent large mass in the left lobe of the liver with associated subcutaneous nodule which  may represent metastatic disease in the midline anterior subcutaneous fat.   Electronically Signed   By: Aletta Edouard M.D.   On: 08/01/2013 17:50   Ct Abdomen Pelvis Wo Contrast  07/31/2013   CLINICAL DATA:  Abdominal pain starting  2 nights ago. Pain located in the central upper abdomen and across the lower abdomen.  EXAM: CT ABDOMEN AND PELVIS WITHOUT CONTRAST  TECHNIQUE: Multidetector CT imaging of the abdomen and pelvis was performed following the standard protocol without intravenous contrast.  COMPARISON:  10/27/2007  FINDINGS: In the pelvis, where is a small collection that lies posterior and to the left of the sigmoid colon adjacent to the iliopsoas muscle. It measures approximately 5.5 cm in greatest transverse dimension. It is likely a peridiverticular abscess.  There is extensive diverticulosis of the colon. There is some thickening of the mid sigmoid colon near the presumed abscess. There are mild associated inflammatory changes. There is some inflammatory change extends from a loop of small bowel in the left anterior pelvis superiorly and posteriorly.  There is small bowel dilation of the proximal mid small bowel. The exact transition point is not definitive, but is suggested in the left anterior pelvis associated with the pelvic inflammation.  There is a small amount of ascites that primarily collects in the pelvis that is also that is seen adjacent to the liver and spleen.  There are multiple prominent, but not pathologically enlarged, mesenteric lymph nodes.  The heart is mildly enlarged. There is lung base atelectasis, greater on the left. A minimal left pleural effusion is present.  There is uptake, masslike area of relative hypoattenuation arising from the lateral segment of the left lobe of the liver. It measures 9.3 cm x 8.1 cm by 8.2 cm. There is some localized fluid adjacent to the left lobe of the liver and the lesser curvature of the stomach. No other liver lesion. Spleen is unremarkable. No convincing gallbladder abnormality. No bile duct dilation. Normal pancreas. No adrenal masses. Kidneys in ureters are unremarkable. Bladder is decompressed by a Foley catheter. Questionable right uterine fibroid. Uterus otherwise  unremarkable.  There are degenerative changes noted along the spine, involving the SI joints and both hips. No osteoblastic or osteolytic lesions.  There are small mass or fluid collection measuring 2.5 cm in the anterior upper abdominal midline, within the deep subcutaneous fat. This is nonspecific. No evidence of an abdominal wall hernia.  IMPRESSION: 1. Partial small bowel obstruction. Transition point is not definitive, but appears to be in the left anterior pelvis where there are inflammatory changes. 2. There is a 5.5 cm collection in the left lower pelvis contiguous with the uterus. The patient has had diverticulitis in this location previously. This may reflect a peridiverticular abscess. 3. Extensive diverticulosis throughout the colon. There is an area of apparent wall thickening of the mid sigmoid colon which would also support diverticulitis. 4. Small amount of ascites. There is focal fluid that collects between the left lobe of the liver and the stomach. There is a small mass or fluid collection in the subcutaneous fat of the upper abdominal midline measuring 2.5 cm x 2.1 cm. 5. There is a hypo attenuating mass-like lesion in the lateral segment of the left lobe of the liver measuring 9.3 cm in greatest dimension. 6. Given this combination of findings. Patient would benefit from a contrast enhanced CT, when the patient can tolerate this procedure or if renal function allows.   Electronically Signed   By: Lajean Manes  M.D.   On: 07/31/2013 16:49   Dg Chest Port 1 View  08/02/2013   CLINICAL DATA:  Severe sepsis with septic shock. Acute renal failure. Acute respiratory failure.  EXAM: PORTABLE CHEST - 1 VIEW  COMPARISON:  08/01/2013  FINDINGS: Persistent low lung volumes seen. Mild decrease in bibasilar atelectasis. Marland Kitchen Heart size is stable. No evidence of pulmonary consolidation or pleural effusion. Right jugular central venous catheter and nasogastric tube remain in appropriate position.  IMPRESSION:  Mild decrease in bibasilar atelectasis.   Electronically Signed   By: Earle Gell M.D.   On: 08/02/2013 08:06   Dg Chest Portable 1 View  08/01/2013   CLINICAL DATA:  Line placement.  EXAM: PORTABLE CHEST - 1 VIEW  COMPARISON:  Chest 07/31/2013.  FINDINGS: The patient has a new right IJ catheter with tip projecting over the lower superior vena cava. NG tube courses into the stomach and below the inferior margin of the film. Lung volumes are low but the lungs are clear. There is no pneumothorax pleural effusion. Cardiomegaly is noted.  IMPRESSION: Tip of right IJ catheter projects over the lower superior vena cava. No pneumothorax.  NG tube courses into the stomach and below the inferior margin of the film.  Cardiomegaly.  Lungs clear.   Electronically Signed   By: Inge Rise M.D.   On: 08/01/2013 13:20   Dg Chest Port 1 View  (if Code Sepsis Called)  07/31/2013   CLINICAL DATA:  Shortness of breath with chest pain.  EXAM: PORTABLE CHEST - 1 VIEW  COMPARISON:  Followup  FINDINGS: Low lung volumes. Enlarged cardiomediastinal silhouette, likely cardiomegaly. Mild vascular congestion. Small bilateral effusions are probable. No acute osseous findings.  IMPRESSION: Low lung volumes.  Cardiomegaly with early vascular congestion.   Electronically Signed   By: Rolla Flatten M.D.   On: 07/31/2013 12:03   US Abdomen Limited Ruq  07/31/2013   CLINICAL DATA:  Septic, liver mass versus abscess  EXAM: US ABDOMEN LIMITED - RIGHT UPPER QUADRANT  COMPARISON:  CT abdomen/ pelvis 07/31/2013  FINDINGS: Gallbladder:  No gallstones or wall thickening visualized. No sonographic Murphy sign noted.  Common bile duct:  Diameter: Within normal limits at 1.8 mm  Liver:  Solid isoechoic mass in the posterior aspect of hepatic segment 2 measures 10.3 x 7.3 x 9.5 cm. There is evidence of internal vascularity on color Doppler imaging. Inferior to the dominant mass, there is a complex cystic structure which is more superficial and likely  subcapsular in location. This complex cystic structure measures 5.1 x 3.8 x 5.8 cm and demonstrates a layering fluid fluid level suspicious for hematocrit effect and the presence of blood products. No vascularity within the cystic abnormality. The remainder the liver is unremarkable.  IMPRESSION: 1. Solid mass lesion in hepatic segment 2 measures up to 10.3 cm in greatest dimension and demonstrates internal vascularity. Differential considerations include hepatocellular carcinoma, metastatic disease, and other benign neoplasms including focal nodular hyperplasia and adenoma. 2. Adjacent subcapsular complex cystic structure with fluid fluid levels is favored to reflect a subcapsular hematoma. This is almost certainly related to the adjacent mass lesion. When the patient is clinically able (able to hold respirations and maintain immobility) evaluation with MRI of the abdomen with and without contrast is recommended to further assess the solid mass.   Electronically Signed   By: Jacqulynn Cadet M.D.   On: 07/31/2013 22:12    Anti-infectives: Anti-infectives   Start     Dose/Rate Route Frequency  Ordered Stop   08/02/13 1200  levofloxacin (LEVAQUIN) IVPB 500 mg  Status:  Discontinued     500 mg 100 mL/hr over 60 Minutes Intravenous Every 48 hours 07/31/13 1812 07/31/13 2002   08/02/13 0400  vancomycin (VANCOCIN) 1,750 mg in sodium chloride 0.9 % 500 mL IVPB     1,750 mg 250 mL/hr over 120 Minutes Intravenous Every 24 hours 08/02/13 0357     08/01/13 1000  ciprofloxacin (CIPRO) IVPB 400 mg     400 mg 200 mL/hr over 60 Minutes Intravenous Every 24 hours 08/01/13 0959     07/31/13 2015  vancomycin (VANCOCIN) 2,500 mg in sodium chloride 0.9 % 500 mL IVPB     2,500 mg 250 mL/hr over 120 Minutes Intravenous  Once 07/31/13 2000 07/31/13 2346   07/31/13 2000  metroNIDAZOLE (FLAGYL) IVPB 500 mg     500 mg 100 mL/hr over 60 Minutes Intravenous Every 8 hours 07/31/13 1812     07/31/13 1830  aztreonam  (AZACTAM) 1 g in dextrose 5 % 50 mL IVPB  Status:  Discontinued     1 g 100 mL/hr over 30 Minutes Intravenous Every 8 hours 07/31/13 1815 08/01/13 0958   07/31/13 1130  levofloxacin (LEVAQUIN) IVPB 750 mg     750 mg 100 mL/hr over 90 Minutes Intravenous  Once 07/31/13 1123 07/31/13 1310   07/31/13 1130  metroNIDAZOLE (FLAGYL) IVPB 500 mg     500 mg 100 mL/hr over 60 Minutes Intravenous  Once 07/31/13 1123 07/31/13 1310      Assessment/Plan: Diverticulitis with abscess - bowel rest, ABX, will see if IR can drain Large liver 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

## 2013-08-02 NOTE — Progress Notes (Signed)
ANTIBIOTIC CONSULT NOTE - FOLLOW UP  Pharmacy Consult for Vancomycin  Indication: Intra-abdominal infection, +blood cultures  Allergies  Allergen Reactions  . Lisinopril     REACTION: lethargy, cough.  . Penicillins     REACTION: LYMPH NODES SWELL   Patient Measurements: Height: 5\' 1"  (154.9 cm) Weight: 280 lb (127.007 kg) IBW/kg (Calculated) : 47.8  Vital Signs: Temp: 99.7 F (37.6 C) (04/03 0300) Temp src: Core (Comment) (04/02 1600) BP: 106/72 mmHg (04/03 0300) Pulse Rate: 99 (04/03 0300) Intake/Output from previous day: 04/02 0701 - 04/03 0700 In: 3055 [I.V.:2505; NG/GT:50; IV Piggyback:500] Out: 1547 [Urine:1147; Emesis/NG output:400] Intake/Output from this shift: Total I/O In: 1030 [I.V.:800; NG/GT:30; IV Piggyback:200] Out: 750 [Urine:550; Emesis/NG output:200]  Labs:  Recent Labs  07/31/13 1100 08/01/13 0417 08/01/13 1835 08/02/13 0300  WBC 9.6 6.2  --  6.1  HGB 14.2 10.8*  --  11.1*  PLT 189 156  --  143*  CREATININE 4.22* 4.36* 2.92* 2.44*   Estimated Creatinine Clearance: 32.7 ml/min (by C-G formula based on Cr of 2.44).  Recent Labs  08/02/13 0300  VANCORANDOM 13.4     Microbiology: Recent Results (from the past 720 hour(s))  CULTURE, BLOOD (ROUTINE X 2)     Status: None   Collection Time    07/31/13 11:20 AM      Result Value Ref Range Status   Specimen Description BLOOD RIGHT ANTECUBITAL   Final   Special Requests BOTTLES DRAWN AEROBIC AND ANAEROBIC 5CC   Final   Culture  Setup Time     Final   Value: 07/31/2013 16:19     Performed at [facility]   Culture     Final   Value: GRAM POSITIVE COCCI IN PAIRS     Note: Gram Stain Report Called to,Read Back By and Verified With: RACHEL [email or web address]  ON [id number] BY [facility]     Performed at [facility]   Report Status PENDING   Incomplete  CULTURE, BLOOD (ROUTINE X 2)     Status: None   Collection Time    07/31/13 11:25 AM      Result Value Ref Range Status   Specimen Description  BLOOD RIGHT ARM UPPER   Final   Special Requests BOTTLES DRAWN AEROBIC AND ANAEROBIC 5CC   Final   Culture  Setup Time     Final   Value: 07/31/2013 14:25     Performed at [facility]   Culture     Final   Value: GRAM POSITIVE RODS     Note: Gram Stain Report Called to,Read Back By and Verified With: MONICA CROSS ON 08/01/2013 AT 9:35P BY [facility]     Performed at [facility]   Report Status PENDING   Incomplete  URINE CULTURE     Status: None   Collection Time    07/31/13 12:19 PM      Result Value Ref Range Status   Specimen Description URINE, CLEAN CATCH   Final   Special Requests NONE   Final   Culture  Setup Time     Final   Value: 07/31/2013 12:54     Performed at [place]     Final   Value: NO GROWTH     Performed at [facility]   Culture     Final   Value: NO GROWTH     Performed at [facility]   Report Status 08/01/2013 FINAL   Final  MRSA PCR SCREENING     Status: None   Collection Time    07/31/13  9:02 PM      Result Value Ref Range Status   MRSA by PCR NEGATIVE  NEGATIVE Final   Comment:            The GeneXpert MRSA Assay (FDA     approved for NASAL specimens     only), is one component of a     comprehensive MRSA colonization     surveillance program. It is not     intended to diagnose MRSA     infection nor to guide or     monitor treatment for     MRSA infections.    Anti-infectives   Start     Dose/Rate Route Frequency Ordered Stop   08/02/13 1200  levofloxacin (LEVAQUIN) IVPB 500 mg  Status:  Discontinued     500 mg 100 mL/hr over 60 Minutes Intravenous Every 48 hours 07/31/13 1812 07/31/13 2002   08/01/13 1000  ciprofloxacin (CIPRO) IVPB 400 mg     400 mg 200 mL/hr over 60 Minutes Intravenous Every 24 hours 08/01/13 0959     07/31/13 2015  vancomycin (VANCOCIN) 2,500 mg in sodium chloride 0.9 % 500 mL IVPB     2,500 mg 250 mL/hr over 120 Minutes Intravenous  Once 07/31/13 2000 07/31/13  2346   07/31/13 2000  metroNIDAZOLE (FLAGYL) IVPB 500 mg     500 mg 100 mL/hr over 60 Minutes Intravenous Every 8 hours 07/31/13 1812     07/31/13 1830  aztreonam (AZACTAM) 1 g in dextrose 5 % 50 mL IVPB  Status:  Discontinued     1 g 100 mL/hr over 30 Minutes Intravenous Every 8 hours 07/31/13 1815 08/01/13 0958   07/31/13 1130  levofloxacin (LEVAQUIN) IVPB 750 mg     750 mg 100 mL/hr over 90 Minutes Intravenous  Once 07/31/13 1123 07/31/13 1310   07/31/13 1130  metroNIDAZOLE (FLAGYL) IVPB 500 mg     500 mg 100 mL/hr over 60 Minutes Intravenous  Once 07/31/13 1123 07/31/13 1310     Assessment: 58 y/o F on vancomycin for intra-abdominal infection, now with +blood cultures, continuing to have fevers, was given 2500 mg of vancomycin with plans for vancomycin level on Saturday, but given fairly rapid improvement in renal function (4.36>>2.44), a vancomycin level was drawn. Vancomycin random this AM is 13.4.   Goal of Therapy:  Vancomycin trough level 15-20 mcg/ml  Plan:  -Start vancomycin 1750 mg IV q24h -May need increase to q12h dosing if renal function continues to improve (or re-check level if it worsens) -Trend WBC, temp -F/U cultures -Drug levels as indicated  Leah Peterson 08/02/2013,3:50 AM

## 2013-08-03 LAB — BASIC METABOLIC PANEL
BUN: 29 mg/dL — AB (ref 6–23)
CO2: 23 meq/L (ref 19–32)
CREATININE: 1.27 mg/dL — AB (ref 0.50–1.10)
Calcium: 8.1 mg/dL — ABNORMAL LOW (ref 8.4–10.5)
Chloride: 104 mEq/L (ref 96–112)
GFR calc Af Amer: 54 mL/min — ABNORMAL LOW (ref >90)
GFR, EST NON AFRICAN AMERICAN: 47 mL/min — AB (ref >90)
GLUCOSE: 122 mg/dL — AB (ref 70–99)
Potassium: 3.2 mEq/L — ABNORMAL LOW (ref 3.7–5.3)
SODIUM: 143 meq/L (ref 137–147)

## 2013-08-03 LAB — CULTURE, BLOOD (ROUTINE X 2)

## 2013-08-03 LAB — MAGNESIUM: MAGNESIUM: 2.2 mg/dL (ref 1.5–2.5)

## 2013-08-03 LAB — PHOSPHORUS: Phosphorus: 3.1 mg/dL (ref 2.3–4.6)

## 2013-08-03 MED ORDER — VANCOMYCIN HCL 10 G IV SOLR
1250.0000 mg | Freq: Two times a day (BID) | INTRAVENOUS | Status: DC
  Administered 2013-08-04 – 2013-08-06 (×5): 1250 mg via INTRAVENOUS
  Filled 2013-08-03 (×8): qty 1250

## 2013-08-03 MED ORDER — POTASSIUM CHLORIDE 10 MEQ/50ML IV SOLN
10.0000 meq | INTRAVENOUS | Status: AC
  Administered 2013-08-03 (×4): 10 meq via INTRAVENOUS
  Filled 2013-08-03 (×3): qty 50

## 2013-08-03 NOTE — Progress Notes (Signed)
ANTIBIOTIC CONSULT NOTE - FOLLOW UP  Pharmacy Consult for Vancomycin  Indication: Intra-abdominal infection, +blood cultures  Allergies  Allergen Reactions  . Lisinopril     REACTION: lethargy, cough.  . Penicillins     REACTION: LYMPH NODES SWELL   Patient Measurements: Height: 5\' 1"  (154.9 cm) Weight: 280 lb (127.007 kg) IBW/kg (Calculated) : 47.8  Vital Signs: Temp: 98.4 F (36.9 C) (04/04 1156) Temp src: Oral (04/04 1156) BP: 132/84 mmHg (04/04 1132) Pulse Rate: 96 (04/04 0443) Intake/Output from previous day: 04/03 0701 - 04/04 0700 In: 1509.6 [I.V.:1009.6; IV Piggyback:500] Out: 1375 [Urine:1250; Emesis/NG output:125] Intake/Output from this shift: Total I/O In: -  Out: 1200 [Urine:1200]  Labs:  Recent Labs  08/01/13 0417  08/02/13 0300 08/02/13 1010 08/03/13 0539  WBC 6.2  --  6.1  --   --   HGB 10.8*  --  11.1*  --   --   PLT 156  --  143*  --   --   CREATININE 4.36*  < > 2.44* 1.94* 1.27*  < > = values in this interval not displayed. Estimated Creatinine Clearance: 62.8 ml/min (by C-G formula based on Cr of 1.27).  Recent Labs  08/02/13 0300  VANCORANDOM 13.4     Microbiology: Recent Results (from the past 720 hour(s))  CULTURE, BLOOD (ROUTINE X 2)     Status: None   Collection Time    07/31/13 11:20 AM      Result Value Ref Range Status   Specimen Description BLOOD RIGHT ANTECUBITAL   Final   Special Requests BOTTLES DRAWN AEROBIC AND ANAEROBIC 5CC   Final   Culture  Setup Time     Final   Value: 07/31/2013 16:19     Performed at [facility]   Culture     Final   Value: STAPHYLOCOCCUS SPECIES (COAGULASE NEGATIVE)     Note: THE SIGNIFICANCE OF ISOLATING THIS ORGANISM FROM A SINGLE SET OF BLOOD CULTURES WHEN MULTIPLE SETS ARE DRAWN IS UNCERTAIN. PLEASE NOTIFY THE MICROBIOLOGY DEPARTMENT WITHIN ONE WEEK IF SPECIATION AND SENSITIVITIES ARE REQUIRED.     Note: Gram Stain Report Called to,Read Back By and Verified With: RACHEL [email or web address]  ON  [id number] BY [facility]     Performed at [facility]   Report Status 08/02/2013 FINAL   Final  CULTURE, BLOOD (ROUTINE X 2)     Status: None   Collection Time    07/31/13 11:25 AM      Result Value Ref Range Status   Specimen Description BLOOD RIGHT ARM UPPER   Final   Special Requests BOTTLES DRAWN AEROBIC AND ANAEROBIC 5CC   Final   Culture  Setup Time     Final   Value: 07/31/2013 14:25     Performed at [facility]   Culture     Final   Value: DIPHTHEROIDS(CORYNEBACTERIUM SPECIES)     Note: Standardized susceptibility testing for this organism is not available.     Note: Gram Stain Report Called to,Read Back By and Verified With: MONICA CROSS ON 08/01/2013 AT 9:35P BY Dennard Nip     Performed at [facility]   Report Status 08/03/2013 FINAL   Final  URINE CULTURE     Status: None   Collection Time    07/31/13 12:19 PM      Result Value Ref Range Status   Specimen Description URINE, CLEAN CATCH   Final   Special Requests NONE   Final   Culture  Setup Time  Final   Value: 07/31/2013 12:54     Performed at [place]     Final   Value: NO GROWTH     Performed at [facility]   Culture     Final   Value: NO GROWTH     Performed at [facility]   Report Status 08/01/2013 FINAL   Final  MRSA PCR SCREENING     Status: None   Collection Time    07/31/13  9:02 PM      Result Value Ref Range Status   MRSA by PCR NEGATIVE  NEGATIVE Final   Comment:            The GeneXpert MRSA Assay (FDA     approved for NASAL specimens     only), is one component of a     comprehensive MRSA colonization     surveillance program. It is not     intended to diagnose MRSA     infection nor to guide or     monitor treatment for     MRSA infections.    Anti-infectives   Start     Dose/Rate Route Frequency Ordered Stop   08/04/13 0500  vancomycin (VANCOCIN) 1,250 mg in sodium chloride 0.9 % 250 mL IVPB     1,250 mg 166.7 mL/hr over 90  Minutes Intravenous Every 12 hours 08/03/13 1507     08/02/13 1200  levofloxacin (LEVAQUIN) IVPB 500 mg  Status:  Discontinued     500 mg 100 mL/hr over 60 Minutes Intravenous Every 48 hours 07/31/13 1812 07/31/13 2002   08/02/13 1000  ciprofloxacin (CIPRO) IVPB 400 mg     400 mg 200 mL/hr over 60 Minutes Intravenous Every 12 hours 08/02/13 0911     08/02/13 0400  vancomycin (VANCOCIN) 1,750 mg in sodium chloride 0.9 % 500 mL IVPB  Status:  Discontinued     1,750 mg 250 mL/hr over 120 Minutes Intravenous Every 24 hours 08/02/13 0357 08/03/13 1507   08/01/13 1000  ciprofloxacin (CIPRO) IVPB 400 mg  Status:  Discontinued     400 mg 200 mL/hr over 60 Minutes Intravenous Every 24 hours 08/01/13 0959 08/02/13 0911   07/31/13 2015  vancomycin (VANCOCIN) 2,500 mg in sodium chloride 0.9 % 500 mL IVPB     2,500 mg 250 mL/hr over 120 Minutes Intravenous  Once 07/31/13 2000 07/31/13 2346   07/31/13 2000  metroNIDAZOLE (FLAGYL) IVPB 500 mg     500 mg 100 mL/hr over 60 Minutes Intravenous Every 8 hours 07/31/13 1812     07/31/13 1830  aztreonam (AZACTAM) 1 g in dextrose 5 % 50 mL IVPB  Status:  Discontinued     1 g 100 mL/hr over 30 Minutes Intravenous Every 8 hours 07/31/13 1815 08/01/13 0958   07/31/13 1130  levofloxacin (LEVAQUIN) IVPB 750 mg     750 mg 100 mL/hr over 90 Minutes Intravenous  Once 07/31/13 1123 07/31/13 1310   07/31/13 1130  metroNIDAZOLE (FLAGYL) IVPB 500 mg     500 mg 100 mL/hr over 60 Minutes Intravenous  Once 07/31/13 1123 07/31/13 1310     Assessment: 58 y/o F on vancomycin for intra-abdominal infection,  was given 2500 mg of vancomycin with plans for vancomycin level on Saturday, but given fairly rapid improvement in renal function (4.36>>2.44), a vancomycin level was drawn 4/3. Dose changed to 1750mg  Q24h. Renal function continues to improve. SCr 1.27  Goal of Therapy:  Vancomycin trough  level 15-20 mcg/ml  Plan:  - Will change dose to vancomycin 1250mg  Q12h starting  4/5 AM given improvement in renal function -Trend WBC, temp -F/U cultures -Drug levels as indicated  Maxine Huynh B. Leitha Schuller, PharmD Clinical Pharmacist - Resident Phone: [phone] Pager: [phone] 08/03/2013 3:09 PM

## 2013-08-03 NOTE — Progress Notes (Signed)
Subjective: Passing flatus, ab pain only at site of mass  Objective: Vital signs in last 24 hours: Temp:  [98.7 F (37.1 C)-100.8 F (38.2 C)] 98.7 F (37.1 C) (04/04 0800) Pulse Rate:  [86-101] 96 (04/04 0443) Resp:  [20-38] 32 (04/04 0800) BP: (111-154)/(69-125) 131/78 mmHg (04/04 0800) SpO2:  [90 %-98 %] 98 % (04/04 0800) FiO2 (%):  [37 %] 37 % (04/04 0400) Last BM Date: 07/28/13  Intake/Output from previous day: 04/03 0701 - 04/04 0700 In: 1509.6 [I.V.:1009.6; IV Piggyback:500] Out: 1375 [Urine:1250; Emesis/NG output:125] Intake/Output this shift:    General appearance: no distress GI: soft tender at epigastric mass o/w nontender with some bs  Lab Results:   Recent Labs  08/01/13 0417 08/02/13 0300  WBC 6.2 6.1  HGB 10.8* 11.1*  HCT 32.0* 32.0*  PLT 156 143*   BMET  Recent Labs  08/02/13 1010 08/03/13 0539  NA 141 143  K 3.4* 3.2*  CL 105 104  CO2 21 23  GLUCOSE 113* 122*  BUN 43* 29*  CREATININE 1.94* 1.27*  CALCIUM 7.9* 8.1*   PT/INR  Recent Labs  07/31/13 2320 08/02/13 1010  LABPROT 18.7* 18.7*  INR 1.61* 1.61*   ABG No results found for this basename: PHART, PCO2, PO2, HCO3,  in the last 72 hours  Studies/Results: Ct Abdomen Pelvis Wo Contrast  08/01/2013   CLINICAL DATA:  Sepsis, possible sigmoid diverticular abscess and hepatic mass. CT performed yesterday demonstrated probable abscess in the left pelvis. Additional CT is performed to try to delineate etiology.  EXAM: CT ABDOMEN AND PELVIS WITHOUT CONTRAST  TECHNIQUE: Multidetector CT imaging of the abdomen and pelvis was performed following the standard protocol without IV contrast.  COMPARISON:  CT ABD/PELV WO CM dated 07/31/2013; US ABDOMEN LIMITED RUQ/ASCITES dated 07/31/2013  FINDINGS: CT was performed in a supine position of the abdomen pelvis initially without contrast material. Repeat imaging was then performed after administration of water soluble rectal contrast.  Initial imaging  demonstrates transit of oral contrast from yesterday's CT into the colon with contrast reaching the splenic flexure. After administration of rectal contrast, there is evidence of a definite communication from the sigmoid colon into a a thick walled abscess cavity of the left pelvis which abuts the left adnexal region. Oral contrast is seen in this cavity. The liquefied central fluid component is quite small, measuring 1.8 cm in diameter. Diverticulosis of the sigmoid and descending colon is present.  Subcutaneous mass of the anterior midline abdomen is again demonstrated measuring up to 2.9 cm in diameter and likely representing a metastatic nodule/lymph node. A large left lobe hepatic mass is again visualized measuring 11 cm in greatest diameter.  A multitude of loculated fluid collections are also again identified. Gastrohepatic fluid along the undersurface of the left lobe of the liver measures approximately 7 cm. Multiple loculations of fluid are present in the mesenteric. Fluid extends into the pelvis with the pelvic component more consistent with non loculated pelvic fluid.  IMPRESSION: 1. There is communication of the sigmoid colon lumen to a small and thick walled abscess with fistula demonstrated from the sigmoid colon lumen after administration of rectal contrast. The liquefied central portion of this abscess only measures 1.8 cm. 2. Multitude of other areas of free fluid and loculated fluid are seen in the peritoneal cavity with the largest focal fluid in the gastrohepatic space under the left lobe of the liver. 3. Persistent large mass in the left lobe of the liver with associated subcutaneous  nodule which may represent metastatic disease in the midline anterior subcutaneous fat.   Electronically Signed   By: Aletta Edouard M.D.   On: 08/01/2013 17:50   Dg Chest Port 1 View  08/02/2013   CLINICAL DATA:  Severe sepsis with septic shock. Acute renal failure. Acute respiratory failure.  EXAM: PORTABLE  CHEST - 1 VIEW  COMPARISON:  08/01/2013  FINDINGS: Persistent low lung volumes seen. Mild decrease in bibasilar atelectasis. Marland Kitchen Heart size is stable. No evidence of pulmonary consolidation or pleural effusion. Right jugular central venous catheter and nasogastric tube remain in appropriate position.  IMPRESSION: Mild decrease in bibasilar atelectasis.   Electronically Signed   By: Earle Gell M.D.   On: 08/02/2013 08:06   Dg Chest Portable 1 View  08/01/2013   CLINICAL DATA:  Line placement.  EXAM: PORTABLE CHEST - 1 VIEW  COMPARISON:  Chest 07/31/2013.  FINDINGS: The patient has a new right IJ catheter with tip projecting over the lower superior vena cava. NG tube courses into the stomach and below the inferior margin of the film. Lung volumes are low but the lungs are clear. There is no pneumothorax pleural effusion. Cardiomegaly is noted.  IMPRESSION: Tip of right IJ catheter projects over the lower superior vena cava. No pneumothorax.  NG tube courses into the stomach and below the inferior margin of the film.  Cardiomegaly.  Lungs clear.   Electronically Signed   By: Inge Rise M.D.   On: 08/01/2013 13:20    Anti-infectives: Anti-infectives   Start     Dose/Rate Route Frequency Ordered Stop   08/02/13 1200  levofloxacin (LEVAQUIN) IVPB 500 mg  Status:  Discontinued     500 mg 100 mL/hr over 60 Minutes Intravenous Every 48 hours 07/31/13 1812 07/31/13 2002   08/02/13 1000  ciprofloxacin (CIPRO) IVPB 400 mg     400 mg 200 mL/hr over 60 Minutes Intravenous Every 12 hours 08/02/13 0911     08/02/13 0400  vancomycin (VANCOCIN) 1,750 mg in sodium chloride 0.9 % 500 mL IVPB     1,750 mg 250 mL/hr over 120 Minutes Intravenous Every 24 hours 08/02/13 0357     08/01/13 1000  ciprofloxacin (CIPRO) IVPB 400 mg  Status:  Discontinued     400 mg 200 mL/hr over 60 Minutes Intravenous Every 24 hours 08/01/13 0959 08/02/13 0911   07/31/13 2015  vancomycin (VANCOCIN) 2,500 mg in sodium chloride 0.9 % 500  mL IVPB     2,500 mg 250 mL/hr over 120 Minutes Intravenous  Once 07/31/13 2000 07/31/13 2346   07/31/13 2000  metroNIDAZOLE (FLAGYL) IVPB 500 mg     500 mg 100 mL/hr over 60 Minutes Intravenous Every 8 hours 07/31/13 1812     07/31/13 1830  aztreonam (AZACTAM) 1 g in dextrose 5 % 50 mL IVPB  Status:  Discontinued     1 g 100 mL/hr over 30 Minutes Intravenous Every 8 hours 07/31/13 1815 08/01/13 0958   07/31/13 1130  levofloxacin (LEVAQUIN) IVPB 750 mg     750 mg 100 mL/hr over 90 Minutes Intravenous  Once 07/31/13 1123 07/31/13 1310   07/31/13 1130  metroNIDAZOLE (FLAGYL) IVPB 500 mg     500 mg 100 mL/hr over 60 Minutes Intravenous  Once 07/31/13 1123 07/31/13 1310      Assessment/Plan: ? Diverticulitis  Would cont to treat with abx Would ask ir next day or two to biopsy liver lesion and epigastric mass i wrote for afp to be sent  in am  [facility] 08/03/2013

## 2013-08-03 NOTE — Progress Notes (Signed)
[facility] TEAM 1 - Stepdown/ICU TEAM Progress Note  Leah Peterson TIR:[id number] DOB: Oct 04, 1955 DOA: 07/31/2013 PCP: Ria Bush, MD  Admit HPI / Brief Narrative: 58 yo F with a PMH of diverticulitis (last treatment one year ago), HTN, IBS, and obesity who presented with abdominal pain and diarrhea.  Was noted to be hypotensive in ED with BP 80's/60's and mildly elevated lactic acid at 3.27. Abd CT showed diverticulitis and free fluid collection with largest one 9.3 cm at left lobe of liver.    SIGNIFICANT EVENTS / STUDIES:  4/2 [place] Abd: Solid mass in hepatic segment 2 of 10.3cm demonstrating internal vascularity. Adjacent subcapsular complex cystic structure with fluid levels favoring subcapsular hematoma-almost certainly related to the adjacent mass lesion. When pt clinically able, evaluation with MRI Abd w/wo contrast is recommended to further assess solid mass.   4/2 CT Abd/Pelvis: Partial SBO, transition point not definitive but appears in left anterior pelvis where there are inflammatory changes. Also, 5.5cm collection in left lower pelvis contiguous with uterus. Pt had diverticulosis in this location previously and may reflect peridiverticular abscess. Extensive diverticulosis with an area of wall thickening of mid sigmoid colon which could support diverticulitis. Small amt of ascites. There is focal fluid collection between left lobe of liver and stomach. Also a small mass/fluid collection in subcutaneous fat of upper abdominal midline measuring 2.5 x 2.1cm. There is a hypo attenuating mass-like lesion in lateral left lobe of liver measuring 9.3cm.  .  4/2 Repeat CT Abd/Pelvis with rectal contrast: communication of the sigmoid colon lumen to a small and thick walled abscess with fistula demonstrated from the sigmoid colon lumen after administration of rectal contrast. The liquefied central portion of this abscess only measures 1.8 cm. Multitude of other areas of free fluid and  loculated fluid are seen in the peritoneal cavity with the largest focal fluid in the gastrohepatic space under the left lobe of the liver. Persistent large mass in the left lobe of the liver with associated subcutaneous nodule which may represent metastatic disease in the midline anterior subcutaneous fat.   HPI/Subjective: Feels somewhat better today.  Passing gas, but not yet moving bowels.  Tolerating water w/o difficulty thus far.   Assessment/Plan:  Acute hypoxic resp failure due to atx and/or mild acute lung injury  Much improved - wean O2 as able   Severe sepsis Septic physiology has resolved  Acute Renal Failure presumed ATN due to severe sepsis - crt is steadily improving   Diverticulitis with Pelvic abscess likely due to contained diverticular rupture and SBO - cont abx - advance diet as per Gen Surg - not amenable to drainage per IR   RUQ/hepatic mass For IR bx once medically stable - discussed w/ pt   Hypokalemia Replace - check Mg   Anemia Hgb stable/improving  DM CBG well controlled due to poor/no intake   Obesity - Body mass index is 52.93 kg/(m^2).  Code Status: FULL Family Communication: no family present at time of exam Disposition Plan: SDU  Consultants: Gen Surg PCCM >> TRH  Antibiotics: Aztreonam 4/1>>4/2  Levaquin 4/1 Metronidazole 4/1>>  Vancomycin 4/1>>  Cipro 4/2>>   DVT prophylaxis: SCDs  Objective: Blood pressure 132/84, pulse 96, temperature 98.4 F (36.9 C), temperature source Oral, resp. rate 40, height 5\' 1"  (1.549 m), weight 127.007 kg (280 lb), SpO2 96.00%.  Intake/Output Summary (Last 24 hours) at 08/03/13 1502 Last data filed at 08/03/13 1149  Gross per 24 hour  Intake  300 ml  Output   1600 ml  Net  -1300 ml   Exam: General: No acute respiratory distress at rest  Lungs: Clear to auscultation bilaterally without wheezes or crackles but w/ poor air movement in B bases  Cardiovascular: Regular rate and rhythm without  murmur gallop or rub normal S1 and S2 Abdomen: tender to palpation in epigastrium and RUQ - bs hypoactive - no rebound - obese  Extremities: No significant cyanosis, clubbing, or edema bilateral lower extremities  Data Reviewed: Basic Metabolic Panel:  Recent Labs Lab 07/31/13 1100 08/01/13 0417 08/01/13 1835 08/02/13 0300 08/02/13 1010 08/03/13 0539  NA 136* 138 137 137 141 143  K 3.7 3.4* 3.2* 3.2* 3.4* 3.2*  CL 91* 98 98 101 105 104  CO2 22 20 20 20 21 23   GLUCOSE 142* 111* 97 115* 113* 122*  BUN 47* 58* 57* 49* 43* 29*  CREATININE 4.22* 4.36* 2.92* 2.44* 1.94* 1.27*  CALCIUM 9.2 7.9* 8.2* 7.8* 7.9* 8.1*  MG  --  1.7  --  1.9  --  2.2  PHOS  --   --   --  4.1  --  3.1   Liver Function Tests:  Recent Labs Lab 07/31/13 1100 07/31/13 2320 08/02/13 0300  AST 92* 83* 87*  ALT 146* 129* 120*  ALKPHOS 89 85 66  BILITOT 1.6* 1.1 0.9  PROT 8.1 6.7 6.3  ALBUMIN 3.0* 2.4* 2.0*   CBC:  Recent Labs Lab 07/31/13 1100 08/01/13 0417 08/02/13 0300  WBC 9.6 6.2 6.1  NEUTROABS 8.0*  --   --   HGB 14.2 10.8* 11.1*  HCT 40.4 32.0* 32.0*  MCV 78.4 78.4 77.5*  PLT 189 156 143*   Cardiac Enzymes:  Recent Labs Lab 07/31/13 2320 08/01/13 0417  TROPONINI <0.30 <0.30   BNP (last 3 results)  Recent Labs  07/31/13 1100  PROBNP 3000.0*   CBG:  Recent Labs Lab 07/31/13 2102  GLUCAP 104*    Recent Results (from the past 240 hour(s))  CULTURE, BLOOD (ROUTINE X 2)     Status: None   Collection Time    07/31/13 11:20 AM      Result Value Ref Range Status   Specimen Description BLOOD RIGHT ANTECUBITAL   Final   Special Requests BOTTLES DRAWN AEROBIC AND ANAEROBIC 5CC   Final   Culture  Setup Time     Final   Value: 07/31/2013 16:19     Performed at [facility]   Culture     Final   Value: STAPHYLOCOCCUS SPECIES (COAGULASE NEGATIVE)     Note: THE SIGNIFICANCE OF ISOLATING THIS ORGANISM FROM A SINGLE SET OF BLOOD CULTURES WHEN MULTIPLE SETS ARE DRAWN IS  UNCERTAIN. PLEASE NOTIFY THE MICROBIOLOGY DEPARTMENT WITHIN ONE WEEK IF SPECIATION AND SENSITIVITIES ARE REQUIRED.     Note: Gram Stain Report Called to,Read Back By and Verified With: RACHEL [id number]  ON [id number] BY [facility]     Performed at [facility]   Report Status 08/02/2013 FINAL   Final  CULTURE, BLOOD (ROUTINE X 2)     Status: None   Collection Time    07/31/13 11:25 AM      Result Value Ref Range Status   Specimen Description BLOOD RIGHT ARM UPPER   Final   Special Requests BOTTLES DRAWN AEROBIC AND ANAEROBIC 5CC   Final   Culture  Setup Time     Final   Value: 07/31/2013 14:25     Performed at [facility]  Culture     Final   Value: DIPHTHEROIDS(CORYNEBACTERIUM SPECIES)     Note: Standardized susceptibility testing for this organism is not available.     Note: Gram Stain Report Called to,Read Back By and Verified With: MONICA CROSS ON 08/01/2013 AT 9:35P BY [facility]     Performed at [facility]   Report Status 08/03/2013 FINAL   Final  URINE CULTURE     Status: None   Collection Time    07/31/13 12:19 PM      Result Value Ref Range Status   Specimen Description URINE, CLEAN CATCH   Final   Special Requests NONE   Final   Culture  Setup Time     Final   Value: 07/31/2013 12:54     Performed at [facility]     Final   Value: NO GROWTH     Performed at [facility]   Culture     Final   Value: NO GROWTH     Performed at [facility]   Report Status 08/01/2013 FINAL   Final  MRSA PCR SCREENING     Status: None   Collection Time    07/31/13  9:02 PM      Result Value Ref Range Status   MRSA by PCR NEGATIVE  NEGATIVE Final   Comment:            The GeneXpert MRSA Assay (FDA     approved for NASAL specimens     only), is one component of a     comprehensive MRSA colonization     surveillance program. It is not     intended to diagnose MRSA     infection nor to guide or     monitor treatment for     MRSA  infections.     Studies:  Recent x-ray studies have been reviewed in detail by the Attending Physician  Scheduled Meds:  Scheduled Meds: . ciprofloxacin  400 mg Intravenous Q12H  . fentaNYL   Intravenous 6 times per day  . lidocaine  15 mL Mouth/Throat Once  . metronidazole  500 mg Intravenous Q8H  . vancomycin  1,750 mg Intravenous Q24H    Time spent on care of this patient: 35 mins   [place]  [phone] Pager - Text Page per Shea Evans as per below:  On-Call/Text Page:      [email or web address]      password [email or web address]  If 7PM-7AM, please contact night-coverage [email or web address] Password [email or web address] 08/03/2013, 3:02 PM   LOS: 3 days

## 2013-08-04 LAB — BASIC METABOLIC PANEL
BUN: 21 mg/dL (ref 6–23)
CO2: 25 meq/L (ref 19–32)
CREATININE: 0.99 mg/dL (ref 0.50–1.10)
Calcium: 8.3 mg/dL — ABNORMAL LOW (ref 8.4–10.5)
Chloride: 103 mEq/L (ref 96–112)
GFR calc non Af Amer: 63 mL/min — ABNORMAL LOW (ref >90)
GFR, EST AFRICAN AMERICAN: 73 mL/min — AB (ref >90)
GLUCOSE: 120 mg/dL — AB (ref 70–99)
POTASSIUM: 3.5 meq/L — AB (ref 3.7–5.3)
Sodium: 142 mEq/L (ref 137–147)

## 2013-08-04 LAB — HEPATIC FUNCTION PANEL
ALK PHOS: 81 U/L (ref 39–117)
ALT: 53 U/L — ABNORMAL HIGH (ref 0–35)
AST: 23 U/L (ref 0–37)
Albumin: 1.9 g/dL — ABNORMAL LOW (ref 3.5–5.2)
BILIRUBIN DIRECT: 0.4 mg/dL — AB (ref 0.0–0.3)
BILIRUBIN INDIRECT: 0.3 mg/dL (ref 0.3–0.9)
BILIRUBIN TOTAL: 0.7 mg/dL (ref 0.3–1.2)
Total Protein: 6.1 g/dL (ref 6.0–8.3)

## 2013-08-04 LAB — AFP TUMOR MARKER: AFP-Tumor Marker: <1.3 ng/mL (ref 0.0–8.0)

## 2013-08-04 LAB — PROTIME-INR
INR: 1.55 — AB (ref 0.00–1.49)
INR: 1.62 — ABNORMAL HIGH (ref 0.00–1.49)
Prothrombin Time: 18.2 seconds — ABNORMAL HIGH (ref 11.6–15.2)
Prothrombin Time: 18.8 seconds — ABNORMAL HIGH (ref 11.6–15.2)

## 2013-08-04 LAB — MAGNESIUM: Magnesium: 2.1 mg/dL (ref 1.5–2.5)

## 2013-08-04 MED ORDER — PHYTONADIONE 5 MG PO TABS
5.0000 mg | ORAL_TABLET | Freq: Once | ORAL | Status: AC
  Administered 2013-08-04: 5 mg via ORAL
  Filled 2013-08-04: qty 1

## 2013-08-04 MED ORDER — POTASSIUM CHLORIDE 10 MEQ/50ML IV SOLN
10.0000 meq | INTRAVENOUS | Status: AC
  Administered 2013-08-04 (×2): 10 meq via INTRAVENOUS
  Filled 2013-08-04: qty 50

## 2013-08-04 NOTE — Progress Notes (Signed)
Wasted 60 mcg  of Fentanyl from old PCA with Laurell Roof.

## 2013-08-04 NOTE — Progress Notes (Signed)
Subjective: Lower abdominal cramping somewhat better  Objective: Vital signs in last 24 hours: Temp:  [98 F (36.7 C)-99.5 F (37.5 C)] 98.3 F (36.8 C) (04/05 0901) Pulse Rate:  [86-95] 86 (04/05 0901) Resp:  [23-40] 27 (04/05 0901) BP: (118-144)/(36-94) 144/88 mmHg (04/05 0901) SpO2:  [96 %-100 %] 100 % (04/05 0901) FiO2 (%):  [37 %] 37 % (04/05 0830) Last BM Date: 07/28/13  Intake/Output from previous day: 04/04 0701 - 04/05 0700 In: -  Out: 1700 [Urine:1700] Intake/Output this shift:    General appearance: alert and cooperative Resp: clear to auscultation bilaterally Cardio: regular rate and rhythm GI: no lower tenderness, tender mass in subcut epigastrium  Lab Results:   Recent Labs  08/02/13 0300  WBC 6.1  HGB 11.1*  HCT 32.0*  PLT 143*   BMET  Recent Labs  08/03/13 0539 08/04/13 0500  NA 143 142  K 3.2* 3.5*  CL 104 103  CO2 23 25  GLUCOSE 122* 120*  BUN 29* 21  CREATININE 1.27* 0.99  CALCIUM 8.1* 8.3*   PT/INR  Recent Labs  08/02/13 1010 08/04/13 0500  LABPROT 18.7* 18.2*  INR 1.61* 1.55*   ABG No results found for this basename: PHART, PCO2, PO2, HCO3,  in the last 72 hours  Studies/Results: No results found.  Anti-infectives: Anti-infectives   Start     Dose/Rate Route Frequency Ordered Stop   08/04/13 0500  vancomycin (VANCOCIN) 1,250 mg in sodium chloride 0.9 % 250 mL IVPB     1,250 mg 166.7 mL/hr over 90 Minutes Intravenous Every 12 hours 08/03/13 1507     08/02/13 1200  levofloxacin (LEVAQUIN) IVPB 500 mg  Status:  Discontinued     500 mg 100 mL/hr over 60 Minutes Intravenous Every 48 hours 07/31/13 1812 07/31/13 2002   08/02/13 1000  ciprofloxacin (CIPRO) IVPB 400 mg     400 mg 200 mL/hr over 60 Minutes Intravenous Every 12 hours 08/02/13 0911     08/02/13 0400  vancomycin (VANCOCIN) 1,750 mg in sodium chloride 0.9 % 500 mL IVPB  Status:  Discontinued     1,750 mg 250 mL/hr over 120 Minutes Intravenous Every 24 hours  08/02/13 0357 08/03/13 1507   08/01/13 1000  ciprofloxacin (CIPRO) IVPB 400 mg  Status:  Discontinued     400 mg 200 mL/hr over 60 Minutes Intravenous Every 24 hours 08/01/13 0959 08/02/13 0911   07/31/13 2015  vancomycin (VANCOCIN) 2,500 mg in sodium chloride 0.9 % 500 mL IVPB     2,500 mg 250 mL/hr over 120 Minutes Intravenous  Once 07/31/13 2000 07/31/13 2346   07/31/13 2000  metroNIDAZOLE (FLAGYL) IVPB 500 mg     500 mg 100 mL/hr over 60 Minutes Intravenous Every 8 hours 07/31/13 1812     07/31/13 1830  aztreonam (AZACTAM) 1 g in dextrose 5 % 50 mL IVPB  Status:  Discontinued     1 g 100 mL/hr over 30 Minutes Intravenous Every 8 hours 07/31/13 1815 08/01/13 0958   07/31/13 1130  levofloxacin (LEVAQUIN) IVPB 750 mg     750 mg 100 mL/hr over 90 Minutes Intravenous  Once 07/31/13 1123 07/31/13 1310   07/31/13 1130  metroNIDAZOLE (FLAGYL) IVPB 500 mg     500 mg 100 mL/hr over 60 Minutes Intravenous  Once 07/31/13 1123 07/31/13 1310      Assessment/Plan: Diverticulitis, continue ABX, allow clears Liver mass and sub cut abdominal mass - AFP P, plan IR BX early this week  LOS: 4  days    Naseer Hearn E 08/04/2013

## 2013-08-04 NOTE — Progress Notes (Signed)
[facility] TEAM 1 - Stepdown/ICU TEAM Progress Note  CHRISHANA SPARGUR JIR:[id number] DOB: 06-Sep-1955 DOA: 07/31/2013 PCP: Ria Bush, MD  Admit HPI / Brief Narrative: 58 yo F with a PMH of diverticulitis (last treatment one year ago), HTN, IBS, and obesity who presented with abdominal pain and diarrhea.  Was noted to be hypotensive in ED with BP 80s/60s and mildly elevated lactic acid at 3.27. Abd CT showed diverticulitis and free fluid collection with largest one 9.3 cm at left lobe of liver.    SIGNIFICANT EVENTS / STUDIES:  4/2 [place] Abd: Solid mass in hepatic segment 2 of 10.3cm demonstrating internal vascularity. Adjacent subcapsular complex cystic structure with fluid levels favoring subcapsular hematoma-almost certainly related to the adjacent mass lesion. When pt clinically able, evaluation with MRI Abd w/wo contrast is recommended to further assess solid mass.   4/2 CT Abd/Pelvis: Partial SBO, transition point not definitive but appears in left anterior pelvis where there are inflammatory changes. Also, 5.5cm collection in left lower pelvis contiguous with uterus. Pt had diverticulosis in this location previously and may reflect peridiverticular abscess. Extensive diverticulosis with an area of wall thickening of mid sigmoid colon which could support diverticulitis. Small amt of ascites. There is focal fluid collection between left lobe of liver and stomach. Also a small mass/fluid collection in subcutaneous fat of upper abdominal midline measuring 2.5 x 2.1cm. There is a hypo attenuating mass-like lesion in lateral left lobe of liver measuring 9.3cm.  .  4/2 Repeat CT Abd/Pelvis with rectal contrast: communication of the sigmoid colon lumen to a small and thick walled abscess with fistula demonstrated from the sigmoid colon lumen after administration of rectal contrast. The liquefied central portion of this abscess only measures 1.8 cm. Multitude of other areas of free fluid and loculated  fluid are seen in the peritoneal cavity with the largest focal fluid in the gastrohepatic space under the left lobe of the liver. Persistent large mass in the left lobe of the liver with associated subcutaneous nodule which may represent metastatic disease in the midline anterior subcutaneous fat.   HPI/Subjective: Feels much better today.  No new complaints.  Anxious to get bx done and "get some answers."   Assessment/Plan:  RUQ hepatic mass / epigastric region subQ mass For IR bx once medically stable - discussed w/ pt and she is ready to proceed- hopeful for Monday   Diverticulitis with Pelvic abscess likely due to contained diverticular rupture and SBO - cont abx - advance diet as per Gen Surg - not amenable to drainage per IR   Acute hypoxic resp failure due to atx and/or mild acute lung injury  Resolved - wean O2 as able   Severe sepsis Septic physiology has resolved  Acute Renal Failure presumed ATN due to severe sepsis - crt has now normalized  Hypokalemia Replace prn - Mg is normal   Anemia Hgb stable/improving  DM CBG well controlled due to poor intake   Obesity - Body mass index is 52.93 kg/(m^2).  Code Status: FULL Family Communication: no family present at time of exam Disposition Plan: SDU  Consultants: Gen Surg PCCM >> TRH IR  Antibiotics: Aztreonam 4/1>>4/2  Levaquin 4/1 Metronidazole 4/1>>  Vancomycin 4/1>>  Cipro 4/2>>   DVT prophylaxis: SCDs  Objective: Blood pressure 144/88, pulse 86, temperature 98.2 F (36.8 C), temperature source Oral, resp. rate 20, height 5\' 1"  (1.549 m), weight 127.007 kg (280 lb), SpO2 100.00%.  Intake/Output Summary (Last 24 hours) at 08/04/13 1550 Last  data filed at 08/04/13 1200  Gross per 24 hour  Intake   1325 ml  Output   1350 ml  Net    -25 ml   Exam: General: No acute respiratory distress at rest  Lungs: Clear to auscultation bilaterally without wheezes or crackles but w/ poor air movement in B bases    Cardiovascular: Regular rate and rhythm without murmur gallop or rub  Abdomen: tender to palpation in epigastrium and RUQ - bs hypoactive - no rebound - obese  Extremities: No significant cyanosis, clubbing, or edema bilateral lower extremities  Data Reviewed: Basic Metabolic Panel:  Recent Labs Lab 07/31/13 1100 08/01/13 0417 08/01/13 1835 08/02/13 0300 08/02/13 1010 08/03/13 0539 08/04/13 0500  NA 136* 138 137 137 141 143 142  K 3.7 3.4* 3.2* 3.2* 3.4* 3.2* 3.5*  CL 91* 98 98 101 105 104 103  CO2 22 20 20 20 21 23 25   GLUCOSE 142* 111* 97 115* 113* 122* 120*  BUN 47* 58* 57* 49* 43* 29* 21  CREATININE 4.22* 4.36* 2.92* 2.44* 1.94* 1.27* 0.99  CALCIUM 9.2 7.9* 8.2* 7.8* 7.9* 8.1* 8.3*  MG  --  1.7  --  1.9  --  2.2 2.1  PHOS  --   --   --  4.1  --  3.1  --    Liver Function Tests:  Recent Labs Lab 07/31/13 1100 07/31/13 2320 08/02/13 0300 08/04/13 0500  AST 92* 83* 87* 23  ALT 146* 129* 120* 53*  ALKPHOS 89 85 66 81  BILITOT 1.6* 1.1 0.9 0.7  PROT 8.1 6.7 6.3 6.1  ALBUMIN 3.0* 2.4* 2.0* 1.9*   CBC:  Recent Labs Lab 07/31/13 1100 08/01/13 0417 08/02/13 0300  WBC 9.6 6.2 6.1  NEUTROABS 8.0*  --   --   HGB 14.2 10.8* 11.1*  HCT 40.4 32.0* 32.0*  MCV 78.4 78.4 77.5*  PLT 189 156 143*   Cardiac Enzymes:  Recent Labs Lab 07/31/13 2320 08/01/13 0417  TROPONINI <0.30 <0.30   BNP (last 3 results)  Recent Labs  07/31/13 1100  PROBNP 3000.0*   CBG:  Recent Labs Lab 07/31/13 2102  GLUCAP 104*    Recent Results (from the past 240 hour(s))  CULTURE, BLOOD (ROUTINE X 2)     Status: None   Collection Time    07/31/13 11:20 AM      Result Value Ref Range Status   Specimen Description BLOOD RIGHT ANTECUBITAL   Final   Special Requests BOTTLES DRAWN AEROBIC AND ANAEROBIC 5CC   Final   Culture  Setup Time     Final   Value: 07/31/2013 16:19     Performed at [facility]   Culture     Final   Value: STAPHYLOCOCCUS SPECIES (COAGULASE  NEGATIVE)     Note: THE SIGNIFICANCE OF ISOLATING THIS ORGANISM FROM A SINGLE SET OF BLOOD CULTURES WHEN MULTIPLE SETS ARE DRAWN IS UNCERTAIN. PLEASE NOTIFY THE MICROBIOLOGY DEPARTMENT WITHIN ONE WEEK IF SPECIATION AND SENSITIVITIES ARE REQUIRED.     Note: Gram Stain Report Called to,Read Back By and Verified With: RACHEL [id number]  ON [id number] BY [facility]     Performed at [facility]   Report Status 08/02/2013 FINAL   Final  CULTURE, BLOOD (ROUTINE X 2)     Status: None   Collection Time    07/31/13 11:25 AM      Result Value Ref Range Status   Specimen Description BLOOD RIGHT ARM UPPER   Final  Special Requests BOTTLES DRAWN AEROBIC AND ANAEROBIC 5CC   Final   Culture  Setup Time     Final   Value: 07/31/2013 14:25     Performed at [facility]   Culture     Final   Value: DIPHTHEROIDS(CORYNEBACTERIUM SPECIES)     Note: Standardized susceptibility testing for this organism is not available.     Note: Gram Stain Report Called to,Read Back By and Verified With: MONICA CROSS ON 08/01/2013 AT 9:35P BY [facility]     Performed at [facility]   Report Status 08/03/2013 FINAL   Final  URINE CULTURE     Status: None   Collection Time    07/31/13 12:19 PM      Result Value Ref Range Status   Specimen Description URINE, CLEAN CATCH   Final   Special Requests NONE   Final   Culture  Setup Time     Final   Value: 07/31/2013 12:54     Performed at [place]     Final   Value: NO GROWTH     Performed at [facility]   Culture     Final   Value: NO GROWTH     Performed at [facility]   Report Status 08/01/2013 FINAL   Final  MRSA PCR SCREENING     Status: None   Collection Time    07/31/13  9:02 PM      Result Value Ref Range Status   MRSA by PCR NEGATIVE  NEGATIVE Final   Comment:            The GeneXpert MRSA Assay (FDA     approved for NASAL specimens     only), is one component of a     comprehensive MRSA colonization      surveillance program. It is not     intended to diagnose MRSA     infection nor to guide or     monitor treatment for     MRSA infections.     Studies:  Recent x-ray studies have been reviewed in detail by the Attending Physician  Scheduled Meds:  Scheduled Meds: . ciprofloxacin  400 mg Intravenous Q12H  . fentaNYL   Intravenous 6 times per day  . lidocaine  15 mL Mouth/Throat Once  . metronidazole  500 mg Intravenous Q8H  . vancomycin  1,250 mg Intravenous Q12H    Time spent on care of this patient: 35 mins   [place]  [phone] Pager - Text Page per Shea Evans as per below:  On-Call/Text Page:      [email or web address]      password [email or web address]  If 7PM-7AM, please contact night-coverage [email or web address] Password [email or web address] 08/04/2013, 3:50 PM   LOS: 4 days

## 2013-08-05 ENCOUNTER — Inpatient Hospital Stay (HOSPITAL_COMMUNITY): Payer: BC Managed Care – PPO

## 2013-08-05 ENCOUNTER — Encounter (HOSPITAL_COMMUNITY): Payer: Self-pay

## 2013-08-05 DIAGNOSIS — R000 Tachycardia, unspecified: Secondary | ICD-10-CM

## 2013-08-05 DIAGNOSIS — A419 Sepsis, unspecified organism: Secondary | ICD-10-CM

## 2013-08-05 DIAGNOSIS — R6521 Severe sepsis with septic shock: Secondary | ICD-10-CM

## 2013-08-05 DIAGNOSIS — I959 Hypotension, unspecified: Secondary | ICD-10-CM

## 2013-08-05 DIAGNOSIS — R6520 Severe sepsis without septic shock: Secondary | ICD-10-CM

## 2013-08-05 LAB — CBC
HCT: 28.6 % — ABNORMAL LOW (ref 36.0–46.0)
HEMATOCRIT: 32.4 % — AB (ref 36.0–46.0)
HEMOGLOBIN: 10.9 g/dL — AB (ref 12.0–15.0)
HEMOGLOBIN: 9.5 g/dL — AB (ref 12.0–15.0)
MCH: 26.7 pg (ref 26.0–34.0)
MCH: 26.4 pg (ref 26.0–34.0)
MCHC: 33.6 g/dL (ref 30.0–36.0)
MCHC: 33.2 g/dL (ref 30.0–36.0)
MCV: 79.2 fL (ref 78.0–100.0)
MCV: 79.4 fL (ref 78.0–100.0)
PLATELETS: 196 10*3/uL (ref 150–400)
Platelets: 158 10*3/uL (ref 150–400)
RBC: 4.09 MIL/uL (ref 3.87–5.11)
RBC: 3.6 MIL/uL — AB (ref 3.87–5.11)
RDW: 15.6 % — AB (ref 11.5–15.5)
RDW: 15.5 % (ref 11.5–15.5)
WBC: 16.3 10*3/uL — ABNORMAL HIGH (ref 4.0–10.5)
WBC: 16 10*3/uL — ABNORMAL HIGH (ref 4.0–10.5)

## 2013-08-05 LAB — CULTURE, BLOOD (ROUTINE X 2)

## 2013-08-05 LAB — URINALYSIS, ROUTINE W REFLEX MICROSCOPIC
GLUCOSE, UA: NEGATIVE mg/dL
Hgb urine dipstick: NEGATIVE
Ketones, ur: NEGATIVE mg/dL
NITRITE: NEGATIVE
PH: 6 (ref 5.0–8.0)
Protein, ur: 30 mg/dL — AB
Specific Gravity, Urine: 1.026 (ref 1.005–1.030)
Urobilinogen, UA: 1 mg/dL (ref 0.0–1.0)

## 2013-08-05 LAB — URINE MICROSCOPIC-ADD ON

## 2013-08-05 LAB — COMPREHENSIVE METABOLIC PANEL
ALBUMIN: 1.9 g/dL — AB (ref 3.5–5.2)
ALK PHOS: 91 U/L (ref 39–117)
ALT: 35 U/L (ref 0–35)
AST: 17 U/L (ref 0–37)
BILIRUBIN TOTAL: 0.8 mg/dL (ref 0.3–1.2)
BUN: 15 mg/dL (ref 6–23)
CO2: 26 mEq/L (ref 19–32)
Calcium: 7.8 mg/dL — ABNORMAL LOW (ref 8.4–10.5)
Chloride: 102 mEq/L (ref 96–112)
Creatinine, Ser: 0.81 mg/dL (ref 0.50–1.10)
GFR calc non Af Amer: 80 mL/min — ABNORMAL LOW (ref >90)
GLUCOSE: 115 mg/dL — AB (ref 70–99)
POTASSIUM: 3.5 meq/L — AB (ref 3.7–5.3)
Sodium: 139 mEq/L (ref 137–147)
TOTAL PROTEIN: 6 g/dL (ref 6.0–8.3)

## 2013-08-05 LAB — PROTIME-INR
INR: 1.71 — ABNORMAL HIGH (ref 0.00–1.49)
PROTHROMBIN TIME: 19.6 s — AB (ref 11.6–15.2)

## 2013-08-05 LAB — HEMOGLOBIN AND HEMATOCRIT, BLOOD
HCT: 25.6 % — ABNORMAL LOW (ref 36.0–46.0)
HCT: 27.1 % — ABNORMAL LOW (ref 36.0–46.0)
HEMOGLOBIN: 8.5 g/dL — AB (ref 12.0–15.0)
Hemoglobin: 9.1 g/dL — ABNORMAL LOW (ref 12.0–15.0)

## 2013-08-05 LAB — MRSA PCR SCREENING: MRSA by PCR: NEGATIVE

## 2013-08-05 LAB — PREPARE RBC (CROSSMATCH)

## 2013-08-05 LAB — GLUCOSE, CAPILLARY: Glucose-Capillary: 114 mg/dL — ABNORMAL HIGH (ref 70–99)

## 2013-08-05 LAB — APTT: aPTT: 37 seconds (ref 24–37)

## 2013-08-05 MED ORDER — SODIUM CHLORIDE 0.9 % IV SOLN
INTRAVENOUS | Status: DC
  Administered 2013-08-05 – 2013-08-07 (×2): via INTRAVENOUS

## 2013-08-05 MED ORDER — MIDAZOLAM HCL 2 MG/2ML IJ SOLN
INTRAMUSCULAR | Status: AC | PRN
  Administered 2013-08-05 (×3): 1 mg via INTRAVENOUS

## 2013-08-05 MED ORDER — FENTANYL CITRATE 0.05 MG/ML IJ SOLN
INTRAMUSCULAR | Status: AC
  Filled 2013-08-05: qty 2

## 2013-08-05 MED ORDER — SODIUM CHLORIDE 0.9 % IV BOLUS (SEPSIS)
500.0000 mL | Freq: Once | INTRAVENOUS | Status: AC
  Administered 2013-08-05: 500 mL via INTRAVENOUS

## 2013-08-05 MED ORDER — FENTANYL CITRATE 0.05 MG/ML IJ SOLN: 25.0000 ug | INTRAMUSCULAR | Status: DC | PRN

## 2013-08-05 MED ORDER — FENTANYL CITRATE 0.05 MG/ML IJ SOLN
INTRAMUSCULAR | Status: AC | PRN
  Administered 2013-08-05 (×2): 25 ug via INTRAVENOUS
  Administered 2013-08-05: 50 ug via INTRAVENOUS

## 2013-08-05 MED ORDER — NOREPINEPHRINE BITARTRATE 1 MG/ML IJ SOLN
2.0000 ug/min | INTRAVENOUS | Status: DC
  Filled 2013-08-05: qty 4

## 2013-08-05 MED ORDER — MIDAZOLAM HCL 2 MG/2ML IJ SOLN
INTRAMUSCULAR | Status: AC
  Filled 2013-08-05: qty 4

## 2013-08-05 NOTE — Progress Notes (Signed)
[facility] Progress Note Patient Name: Leah Peterson DOB: [birth year] MRN: [id number]  Date of Service  08/05/2013   HPI/Events of Note  INR 1.7     eICU Interventions  Will give two units FFP   Intervention Category Major Interventions: Hemorrhage - evaluation and management  Asencion Noble 08/05/2013, 9:07 PM

## 2013-08-05 NOTE — Progress Notes (Signed)
Follow up after IR procedure earlier today. Pt seen with Dr. Kathlene Cote Hypotensive but has improved with IVF support. No pressors started yet. Has become more tachycardic despite BP improvement. Hgb dropped from 10.9 to 9.5  Pt feels ok, mild discomfort as expected with drain, but less than what would be expected with intraperitoneal hemorrhage.  BP 94/75  Pulse 125  Temp(Src) 98.6 F (37 C) (Oral)  Resp 42  Ht 5\' 1"  (1.549 m)  Wt 280 lb (127.007 kg)  BMI 52.93 kg/m2  SpO2 100% Abd soft,, mildly tender, no peritoneal signs. Drain intact, cloudy bloody output, but not gross blood.   Agree with plan for transfer to ICU, CT Abd/pelvis to eval for acute bleed. Could also be sepsis/chock from breaking into the abscess. IR will continue following.   Ascencion Dike PA-C Interventional Radiology 08/05/2013 4:45 PM

## 2013-08-05 NOTE — Progress Notes (Signed)
Subjective: Pt became tachycardiac and hypoTN after multiple IR guided intra-abd biopsies. tx to ICU for monitoring. Ask by Dr Joya Gaskins to see. Pt states her abd pain is no worse than this am. "spasms". Nausea earlier but none now. +Flatus. Some abd discomfort if takes deep breath. Getting 1u prbc  Objective: Vital signs in last 24 hours: Temp:  [98.5 F (36.9 C)-99.2 F (37.3 C)] 98.9 F (37.2 C) (04/06 1844) Pulse Rate:  [87-126] 120 (04/06 1845) Resp:  [12-42] 41 (04/06 1845) BP: (58-168)/(29-104) 88/62 mmHg (04/06 1845) SpO2:  [96 %-100 %] 100 % (04/06 1845) FiO2 (%):  [37 %] 37 % (04/05 1928) Last BM Date: 07/28/13  Intake/Output from previous day: 04/05 0701 - 04/06 0700 In: 1550 [I.V.:750; IV Piggyback:800] Out: 1250 [Urine:1250] Intake/Output this shift:    Alert, nontoxic. A little clammy.  Tachy 120s. BP hi 80s-low 90s cta  Morbidly obese, soft, nt. Pigtail drain - seropurulent  Lab Results:   Recent Labs  08/05/13 0500 08/05/13 1330 08/05/13 1700  WBC 16.3* 16.0*  --   HGB 10.9* 9.5* 8.5*  HCT 32.4* 28.6* 25.6*  PLT 158 196  --    BMET  Recent Labs  08/04/13 0500 08/05/13 0500  NA 142 139  K 3.5* 3.5*  CL 103 102  CO2 25 26  GLUCOSE 120* 115*  BUN 21 15  CREATININE 0.99 0.81  CALCIUM 8.3* 7.8*   PT/INR  Recent Labs  08/04/13 0500 08/04/13 1730  LABPROT 18.2* 18.8*  INR 1.55* 1.62*   ABG No results found for this basename: PHART, PCO2, PO2, HCO3,  in the last 72 hours  Studies/Results: Ct Abdomen Pelvis Wo Contrast  08/05/2013   CLINICAL DATA:  Evaluate intra-abdominal hemorrhage. Angiogram this morning. Known liver mass.  EXAM: CT ABDOMEN AND PELVIS WITHOUT CONTRAST  TECHNIQUE: Multidetector CT imaging of the abdomen and pelvis was performed following the standard protocol without intravenous contrast.  COMPARISON:  US BIOPSY dated 08/05/2013; CT ABD/PELV WO CM dated 08/01/2013; CT ABD/PELV WO CM dated 07/31/2013  FINDINGS: Small bilateral  pleural effusions with bilateral lower lobe atelectasis.  There is a pigtail drainage catheter in the midline of the upper abdomen, extending inferior to the right hepatic lobe. The previously seen sub hepatic fluid collection is being drained by the pigtail drainage catheter and collection is now decompressed. There is gas within the left hepatic lobe mass. Left hepatic lobe mass measures 10 cm AP x 13.8 cm transverse. This is larger than on the prior examination and there is some increased attenuation within the mass, some of which represents Gel-Foam and some of which represents hemorrhage associated with instrumentation earlier today. There is an anterior left hepatic lobe subcapsular hematoma which is difficult to clearly define but measures 3.3 cm in thickness with layering hematocrit level.  In the anatomic pelvis, there is an air-fluid level which appears distinct from the collapsed urinary bladder which has a Foley catheter. This may be within the deep pelvic abscess anterior to the uterus however the pelvis is poorly visualized in the absence of IV contrast. Hemo peritoneum is present tracking along the left abdominal wall, outlining the pericolic gutter.  Vicarious excretion of contrast is present in the gallbladder. Kidneys and adrenals appear within normal limits. There is no bowel obstruction. Fluid collection in the anterior abdominal wall appears slightly smaller compatible with needle aspiration. Foley catheter present urinary bladder. 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  IMPRESSION: 1. Interval placement of pigtail drainage catheter in  the subhepatic region inferior to the left hepatic lobe. 2. Small amount of bleeding into the left hepatic lobe mass. New gas within the mass is compatible with Gel-Foam from procedure or earlier today. 3. Small anterior left hepatic lobe subcapsular hematoma with hematocrit level. 4. Moderate hemoperitoneum is new. 5. Probable abscess in the  anterior anatomic pelvis superior to the dome of the urinary bladder (image 76 series 2).   Electronically Signed   By: Dereck Ligas M.D.   On: 08/05/2013 17:16   [place] Abscess Drain  08/05/2013   CLINICAL DATA:  11 cm left hepatic mass, subcutaneous soft tissue nodule and perihepatic fluid collection by prior CT.  EXAM: 1. ULTRASOUND-GUIDED CORE BIOPSY OF THE LIVER 2. ULTRASOUND-GUIDED ASPIRATION OF SUBCUTANEOUS FLUID COLLECTION 3. ULTRASOUND-GUIDED PERCUTANEOUS CATHETER DRAINAGE OF PERIHEPATIC ABSCESS  MEDICATIONS: 3.0 mg IV Versed; 100 mcg IV Fentanyl  Total Moderate Sedation Time: 24 minutes.  PROCEDURE: The procedure, risks, benefits, and alternatives were explained to the patient. Questions regarding the procedure were encouraged and answered. The patient understands and consents to the procedure.  The abdominal wall was prepped with Betadine in a sterile fashion, and a sterile drape was applied covering the operative field. A sterile gown and sterile gloves were used for the procedure. Local anesthesia was provided with 1% Lidocaine.  The liver, subcutaneous tissues and perihepatic region were first examined by ultrasound. An 18 gauge needle was advanced into the midline subcutaneous abdominal wall fluid collection and aspiration performed. Aspirated fluid sample was sent for culture analysis.  Ultrasound-guided biopsy was performed of a hepatic mass in the left lobe of the liver. A 17 gauge needle was advanced into the lesion. A total of 2 separate 18 gauge core biopsy samples were obtained and submitted in formalin. A slurry of sterile saline and Gel-Foam pledgets was injected through the outer needle as it was retracted out of the liver. Ultrasound was performed immediately post biopsy.  An 18 gauge needle was advanced into a left perihepatic/ subhepatic fluid collection. Aspiration was performed. A fluid sample was sent for culture. A guidewire was advanced into the collection. The tract was dilated and  a 12 [place] percutaneous drain advanced into the collection. The drain was connected to a suction bulb. It was secured at the skin with a Prolene retention suture and StatLock device.  COMPLICATIONS: None.  FINDINGS: The midline subcutaneous abnormality superficial to the abdominal wall seen by CT corresponds to a complex fluid collection by ultrasound with some surrounding tissue thickening. Aspiration yielded grossly purulent fluid. The cavity was largely decompressed but needle aspiration and knee fluid sample sent for culture analysis.  The nearly 11 cm heterogeneous mass in the lateral segment of the left lobe of the liver was sampled, revealing solid tissue. There was considerable bleeding from the trocar needle after core biopsy and Gel-Foam pledgets were instilled as the outer needle was removed. No active bleeding was visualized from the surface of the liver by ultrasound immediately after completion of the biopsy.  The complex fluid collection adjacent to the left lobe of the liver and extending into the peritoneal cavity shows visible enlargement by ultrasound compared to a prior ultrasound 5 days ago with maximal dimensions of approximately 10 cm. This also appears to communicate with some additional complex fluid in the midline abdomen. Aspiration yielded bloody and turbid fluid which appeared likely infected. For this reason, a percutaneous drain was placed. A 12 French drain was placed over a wire and attached to a suction  bulb. Fluid samples from this collection were sent for culture analysis as well as cytology.  IMPRESSION: 1. Aspiration of midline subcutaneous fluid collection yielded purulent fluid. The small collection was nearly completely decompressed by needle aspiration and a sample of fluid sent for culture analysis. 2. Ultrasound-guided core biopsy was performed of the large left lobe hepatic mass. The percutaneous tract was embolized with Gel-Foam pledgets due to bleeding from the outer  needle during the biopsy. There was no immediate evidence of active bleeding from the surface of the liver upon completion of the biopsy. Core samples were sent for histologic analysis. 3. Enlargement of complex fluid collection adjacent to the left lobe of the liver and extending into the peritoneal cavity. Aspiration yielded bloody and turbid fluid which was sent for culture analysis and cytologic analysis. A 12 French percutaneous drain was placed in this collection and attached to a suction bulb. Output will be followed.   Electronically Signed   By: Aletta Edouard M.D.   On: 08/05/2013 13:45   US Biopsy  08/05/2013   CLINICAL DATA:  11 cm left hepatic mass, subcutaneous soft tissue nodule and perihepatic fluid collection by prior CT.  EXAM: 1. ULTRASOUND-GUIDED CORE BIOPSY OF THE LIVER 2. ULTRASOUND-GUIDED ASPIRATION OF SUBCUTANEOUS FLUID COLLECTION 3. ULTRASOUND-GUIDED PERCUTANEOUS CATHETER DRAINAGE OF PERIHEPATIC ABSCESS  MEDICATIONS: 3.0 mg IV Versed; 100 mcg IV Fentanyl  Total Moderate Sedation Time: 24 minutes.  PROCEDURE: The procedure, risks, benefits, and alternatives were explained to the patient. Questions regarding the procedure were encouraged and answered. The patient understands and consents to the procedure.  The abdominal wall was prepped with Betadine in a sterile fashion, and a sterile drape was applied covering the operative field. A sterile gown and sterile gloves were used for the procedure. Local anesthesia was provided with 1% Lidocaine.  The liver, subcutaneous tissues and perihepatic region were first examined by ultrasound. An 18 gauge needle was advanced into the midline subcutaneous abdominal wall fluid collection and aspiration performed. Aspirated fluid sample was sent for culture analysis.  Ultrasound-guided biopsy was performed of a hepatic mass in the left lobe of the liver. A 17 gauge needle was advanced into the lesion. A total of 2 separate 18 gauge core biopsy samples  were obtained and submitted in formalin. A slurry of sterile saline and Gel-Foam pledgets was injected through the outer needle as it was retracted out of the liver. Ultrasound was performed immediately post biopsy.  An 18 gauge needle was advanced into a left perihepatic/ subhepatic fluid collection. Aspiration was performed. A fluid sample was sent for culture. A guidewire was advanced into the collection. The tract was dilated and a 12 [place] percutaneous drain advanced into the collection. The drain was connected to a suction bulb. It was secured at the skin with a Prolene retention suture and StatLock device.  COMPLICATIONS: None.  FINDINGS: The midline subcutaneous abnormality superficial to the abdominal wall seen by CT corresponds to a complex fluid collection by ultrasound with some surrounding tissue thickening. Aspiration yielded grossly purulent fluid. The cavity was largely decompressed but needle aspiration and knee fluid sample sent for culture analysis.  The nearly 11 cm heterogeneous mass in the lateral segment of the left lobe of the liver was sampled, revealing solid tissue. There was considerable bleeding from the trocar needle after core biopsy and Gel-Foam pledgets were instilled as the outer needle was removed. No active bleeding was visualized from the surface of the liver by ultrasound immediately after completion of  the biopsy.  The complex fluid collection adjacent to the left lobe of the liver and extending into the peritoneal cavity shows visible enlargement by ultrasound compared to a prior ultrasound 5 days ago with maximal dimensions of approximately 10 cm. This also appears to communicate with some additional complex fluid in the midline abdomen. Aspiration yielded bloody and turbid fluid which appeared likely infected. For this reason, a percutaneous drain was placed. A 12 French drain was placed over a wire and attached to a suction bulb. Fluid samples from this collection were  sent for culture analysis as well as cytology.  IMPRESSION: 1. Aspiration of midline subcutaneous fluid collection yielded purulent fluid. The small collection was nearly completely decompressed by needle aspiration and a sample of fluid sent for culture analysis. 2. Ultrasound-guided core biopsy was performed of the large left lobe hepatic mass. The percutaneous tract was embolized with Gel-Foam pledgets due to bleeding from the outer needle during the biopsy. There was no immediate evidence of active bleeding from the surface of the liver upon completion of the biopsy. Core samples were sent for histologic analysis. 3. Enlargement of complex fluid collection adjacent to the left lobe of the liver and extending into the peritoneal cavity. Aspiration yielded bloody and turbid fluid which was sent for culture analysis and cytologic analysis. A 12 French percutaneous drain was placed in this collection and attached to a suction bulb. Output will be followed.   Electronically Signed   By: Aletta Edouard M.D.   On: 08/05/2013 13:45   US Aspiration  08/05/2013   CLINICAL DATA:  11 cm left hepatic mass, subcutaneous soft tissue nodule and perihepatic fluid collection by prior CT.  EXAM: 1. ULTRASOUND-GUIDED CORE BIOPSY OF THE LIVER 2. ULTRASOUND-GUIDED ASPIRATION OF SUBCUTANEOUS FLUID COLLECTION 3. ULTRASOUND-GUIDED PERCUTANEOUS CATHETER DRAINAGE OF PERIHEPATIC ABSCESS  MEDICATIONS: 3.0 mg IV Versed; 100 mcg IV Fentanyl  Total Moderate Sedation Time: 24 minutes.  PROCEDURE: The procedure, risks, benefits, and alternatives were explained to the patient. Questions regarding the procedure were encouraged and answered. The patient understands and consents to the procedure.  The abdominal wall was prepped with Betadine in a sterile fashion, and a sterile drape was applied covering the operative field. A sterile gown and sterile gloves were used for the procedure. Local anesthesia was provided with 1% Lidocaine.  The liver,  subcutaneous tissues and perihepatic region were first examined by ultrasound. An 18 gauge needle was advanced into the midline subcutaneous abdominal wall fluid collection and aspiration performed. Aspirated fluid sample was sent for culture analysis.  Ultrasound-guided biopsy was performed of a hepatic mass in the left lobe of the liver. A 17 gauge needle was advanced into the lesion. A total of 2 separate 18 gauge core biopsy samples were obtained and submitted in formalin. A slurry of sterile saline and Gel-Foam pledgets was injected through the outer needle as it was retracted out of the liver. Ultrasound was performed immediately post biopsy.  An 18 gauge needle was advanced into a left perihepatic/ subhepatic fluid collection. Aspiration was performed. A fluid sample was sent for culture. A guidewire was advanced into the collection. The tract was dilated and a 12 [place] percutaneous drain advanced into the collection. The drain was connected to a suction bulb. It was secured at the skin with a Prolene retention suture and StatLock device.  COMPLICATIONS: None.  FINDINGS: The midline subcutaneous abnormality superficial to the abdominal wall seen by CT corresponds to a complex fluid collection by ultrasound with  some surrounding tissue thickening. Aspiration yielded grossly purulent fluid. The cavity was largely decompressed but needle aspiration and knee fluid sample sent for culture analysis.  The nearly 11 cm heterogeneous mass in the lateral segment of the left lobe of the liver was sampled, revealing solid tissue. There was considerable bleeding from the trocar needle after core biopsy and Gel-Foam pledgets were instilled as the outer needle was removed. No active bleeding was visualized from the surface of the liver by ultrasound immediately after completion of the biopsy.  The complex fluid collection adjacent to the left lobe of the liver and extending into the peritoneal cavity shows visible  enlargement by ultrasound compared to a prior ultrasound 5 days ago with maximal dimensions of approximately 10 cm. This also appears to communicate with some additional complex fluid in the midline abdomen. Aspiration yielded bloody and turbid fluid which appeared likely infected. For this reason, a percutaneous drain was placed. A 12 French drain was placed over a wire and attached to a suction bulb. Fluid samples from this collection were sent for culture analysis as well as cytology.  IMPRESSION: 1. Aspiration of midline subcutaneous fluid collection yielded purulent fluid. The small collection was nearly completely decompressed by needle aspiration and a sample of fluid sent for culture analysis. 2. Ultrasound-guided core biopsy was performed of the large left lobe hepatic mass. The percutaneous tract was embolized with Gel-Foam pledgets due to bleeding from the outer needle during the biopsy. There was no immediate evidence of active bleeding from the surface of the liver upon completion of the biopsy. Core samples were sent for histologic analysis. 3. Enlargement of complex fluid collection adjacent to the left lobe of the liver and extending into the peritoneal cavity. Aspiration yielded bloody and turbid fluid which was sent for culture analysis and cytologic analysis. A 12 French percutaneous drain was placed in this collection and attached to a suction bulb. Output will be followed.   Electronically Signed   By: Aletta Edouard M.D.   On: 08/05/2013 13:45    Anti-infectives: Anti-infectives   Start     Dose/Rate Route Frequency Ordered Stop   08/04/13 0500  vancomycin (VANCOCIN) 1,250 mg in sodium chloride 0.9 % 250 mL IVPB     1,250 mg 166.7 mL/hr over 90 Minutes Intravenous Every 12 hours 08/03/13 1507     08/02/13 1200  levofloxacin (LEVAQUIN) IVPB 500 mg  Status:  Discontinued     500 mg 100 mL/hr over 60 Minutes Intravenous Every 48 hours 07/31/13 1812 07/31/13 2002   08/02/13 1000   ciprofloxacin (CIPRO) IVPB 400 mg     400 mg 200 mL/hr over 60 Minutes Intravenous Every 12 hours 08/02/13 0911     08/02/13 0400  vancomycin (VANCOCIN) 1,750 mg in sodium chloride 0.9 % 500 mL IVPB  Status:  Discontinued     1,750 mg 250 mL/hr over 120 Minutes Intravenous Every 24 hours 08/02/13 0357 08/03/13 1507   08/01/13 1000  ciprofloxacin (CIPRO) IVPB 400 mg  Status:  Discontinued     400 mg 200 mL/hr over 60 Minutes Intravenous Every 24 hours 08/01/13 0959 08/02/13 0911   07/31/13 2015  vancomycin (VANCOCIN) 2,500 mg in sodium chloride 0.9 % 500 mL IVPB     2,500 mg 250 mL/hr over 120 Minutes Intravenous  Once 07/31/13 2000 07/31/13 2346   07/31/13 2000  metroNIDAZOLE (FLAGYL) IVPB 500 mg     500 mg 100 mL/hr over 60 Minutes Intravenous Every 8 hours 07/31/13 1812  07/31/13 1830  aztreonam (AZACTAM) 1 g in dextrose 5 % 50 mL IVPB  Status:  Discontinued     1 g 100 mL/hr over 30 Minutes Intravenous Every 8 hours 07/31/13 1815 08/01/13 0958   07/31/13 1130  levofloxacin (LEVAQUIN) IVPB 750 mg     750 mg 100 mL/hr over 90 Minutes Intravenous  Once 07/31/13 1123 07/31/13 1310   07/31/13 1130  metroNIDAZOLE (FLAGYL) IVPB 500 mg     500 mg 100 mL/hr over 60 Minutes Intravenous  Once 07/31/13 1123 07/31/13 1310      Assessment/Plan: Diverticulitis with pelvic abscess? Liver mass ABL anemia Shock  The etiology of her overall picture is unclear. Is this perf diverticulitis with abscess and subcu abscess with separate liver mass or perf colon cancer? With liver met? Or primary liver neoplasm. IR getting solid tissue on bx today of liver mass is concerning.   Nonetheless her immediate issue is tachycardia and hypoTN.  Assume hypovolemic shock before septic shock. Getting 1u prbc now. coags were elevated this am.   rec  Checking coags/DIC panel ASAP after blood in. Aggressively correct coagulopathy. Maintain type and cross and keep 2u ahead. Transfuse to keep hgb at least >7.  Right now see no need for laparotomy - abd is nice soft, nontender, making urine.   Leighton Ruff. Redmond Pulling, MD, FACS General, Bariatric, & Minimally Invasive Surgery [facility], [place]   LOS: 5 days    Gayland Curry 08/05/2013

## 2013-08-05 NOTE — Progress Notes (Signed)
[facility] TEAM 1 - Stepdown/ICU TEAM Progress Note  Leah Peterson [id number] DOB: 1955-08-06 DOA: 07/31/2013 PCP: Ria Bush, MD  Admit HPI / Brief Narrative: 58 yo F with a PMH of diverticulitis (last treatment one year ago), HTN, IBS, and obesity who presented with abdominal pain and diarrhea.  Was noted to be hypotensive in ED with BP 80s/60s and mildly elevated lactic acid at 3.27. Abd CT showed diverticulitis and free fluid collection with largest one 9.3 cm at left lobe of liver.    SIGNIFICANT EVENTS / STUDIES:  4/2 [place] Abd: Solid mass in hepatic segment 2 of 10.3cm demonstrating internal vascularity. Adjacent subcapsular complex cystic structure with fluid levels favoring subcapsular hematoma-almost certainly related to the adjacent mass lesion. When pt clinically able, evaluation with MRI Abd w/wo contrast is recommended to further assess solid mass.   4/2 CT Abd/Pelvis: Partial SBO, transition point not definitive but appears in left anterior pelvis where there are inflammatory changes. Also, 5.5cm collection in left lower pelvis contiguous with uterus. Pt had diverticulosis in this location previously and may reflect peridiverticular abscess. Extensive diverticulosis with an area of wall thickening of mid sigmoid colon which could support diverticulitis. Small amt of ascites. There is focal fluid collection between left lobe of liver and stomach. Also a small mass/fluid collection in subcutaneous fat of upper abdominal midline measuring 2.5 x 2.1cm. There is a hypo attenuating mass-like lesion in lateral left lobe of liver measuring 9.3cm.  .  4/2 Repeat CT Abd/Pelvis with rectal contrast: communication of the sigmoid colon lumen to a small and thick walled abscess with fistula demonstrated from the sigmoid colon lumen after administration of rectal contrast. The liquefied central portion of this abscess only measures 1.8 cm. Multitude of other areas of free fluid and loculated  fluid are seen in the peritoneal cavity with the largest focal fluid in the gastrohepatic space under the left lobe of the liver. Persistent large mass in the left lobe of the liver with associated subcutaneous nodule which may represent metastatic disease in the midline anterior subcutaneous fat.   4/06 - Per IR:  1) Core biopsy of left lobe liver mass, 2) Aspiration of subcutaneous fluid collection, 3) Percutaneous drainage of perihepatic fluid.   HPI/Subjective: Pt seen post-bx.  She is tachycardic, tachypneic, diaphoretic, and lethargic.  She c/o pain at the bx site.  Assessment/Plan:  L hepatic lobe mass / epigastric region subQ mass vs/ abcess / perihepatic ?abscess S/p IR bx today - path/cultures pending - post-procedure hypotension has only minimally responded to IVF - may require pressors - is shocky - PCCM consulted for transfer to ICU  Diverticulitis with Pelvic abscess likely due to contained diverticular rupture and SBO - cont abx  - not amenable to drainage per IR   Acute hypoxic resp failure due to atx and/or mild acute lung injury  wean O2 as able   Severe sepsis Appears to be recurring post bx - see discussion above   Acute Renal Failure presumed ATN due to severe sepsis - crt has now normalized  Hypokalemia Replace prn - Mg is normal   Anemia Hgb stable - follow trend post hepatic bx (no evidence of bleeding on post-bx [place] of liver)  DM CBG well controlled due to poor intake   Obesity - Body mass index is 52.93 kg/(m^2).  Code Status: FULL Family Communication: spoke w/ daughter at bedside Disposition Plan: SDU >> ICU  Consultants: Gen Surg PCCM >> TRH >> PCCM IR  Antibiotics: Aztreonam 4/1>>4/2  Levaquin 4/1 Metronidazole 4/1>>  Vancomycin 4/1>>  Cipro 4/2>>   DVT prophylaxis: SCDs  Objective: Blood pressure 86/65, pulse 116, temperature 98.5 F (36.9 C), temperature source Oral, resp. rate 37, height 5\' 1"  (1.549 m), weight 127.007 kg (280  lb), SpO2 99.00%.  Intake/Output Summary (Last 24 hours) at 08/05/13 1420 Last data filed at 08/05/13 0900  Gross per 24 hour  Intake 1801.67 ml  Output   1400 ml  Net 401.67 ml   Exam: General: tachypneic and lethargic  Lungs: Clear to auscultation bilaterally without wheezes or crackles but w/ poor air movement in B bases  Cardiovascular: tachycardic - diaphoretic  Abdomen: tender to palpation in epigastrium and RUQ - bs hypoactive - no rebound - obese - purulnet material collecting in suction bulb drain  Extremities: No significant cyanosis, clubbing, or edema bilateral lower extremities  Data Reviewed: Basic Metabolic Panel:  Recent Labs Lab 07/31/13 1100 08/01/13 0417  08/02/13 0300 08/02/13 1010 08/03/13 0539 08/04/13 0500 08/05/13 0500  NA 136* 138  < > 137 141 143 142 139  K 3.7 3.4*  < > 3.2* 3.4* 3.2* 3.5* 3.5*  CL 91* 98  < > 101 105 104 103 102  CO2 22 20  < > 20 21 23 25 26   GLUCOSE 142* 111*  < > 115* 113* 122* 120* 115*  BUN 47* 58*  < > 49* 43* 29* 21 15  CREATININE 4.22* 4.36*  < > 2.44* 1.94* 1.27* 0.99 0.81  CALCIUM 9.2 7.9*  < > 7.8* 7.9* 8.1* 8.3* 7.8*  MG  --  1.7  --  1.9  --  2.2 2.1  --   PHOS  --   --   --  4.1  --  3.1  --   --   < > = values in this interval not displayed. Liver Function Tests:  Recent Labs Lab 07/31/13 1100 07/31/13 2320 08/02/13 0300 08/04/13 0500 08/05/13 0500  AST 92* 83* 87* 23 17  ALT 146* 129* 120* 53* 35  ALKPHOS 89 85 66 81 91  BILITOT 1.6* 1.1 0.9 0.7 0.8  PROT 8.1 6.7 6.3 6.1 6.0  ALBUMIN 3.0* 2.4* 2.0* 1.9* 1.9*   CBC:  Recent Labs Lab 07/31/13 1100 08/01/13 0417 08/02/13 0300 08/05/13 0500 08/05/13 1330  WBC 9.6 6.2 6.1 16.3* 16.0*  NEUTROABS 8.0*  --   --   --   --   HGB 14.2 10.8* 11.1* 10.9* 9.5*  HCT 40.4 32.0* 32.0* 32.4* 28.6*  MCV 78.4 78.4 77.5* 79.2 79.4  PLT 189 156 143* 158 196   Cardiac Enzymes:  Recent Labs Lab 07/31/13 2320 08/01/13 0417  TROPONINI <0.30 <0.30   BNP  (last 3 results)  Recent Labs  07/31/13 1100  PROBNP 3000.0*   CBG:  Recent Labs Lab 07/31/13 2102  GLUCAP 104*    Recent Results (from the past 240 hour(s))  CULTURE, BLOOD (ROUTINE X 2)     Status: None   Collection Time    07/31/13 11:20 AM      Result Value Ref Range Status   Specimen Description BLOOD RIGHT ANTECUBITAL   Final   Special Requests BOTTLES DRAWN AEROBIC AND ANAEROBIC 5CC   Final   Culture  Setup Time     Final   Value: 07/31/2013 16:19     Performed at [facility]   Culture     Final   Value: STAPHYLOCOCCUS SPECIES (COAGULASE NEGATIVE)  Note: THE SIGNIFICANCE OF ISOLATING THIS ORGANISM FROM A SINGLE SET OF BLOOD CULTURES WHEN MULTIPLE SETS ARE DRAWN IS UNCERTAIN. PLEASE NOTIFY THE MICROBIOLOGY DEPARTMENT WITHIN ONE WEEK IF SPECIATION AND SENSITIVITIES ARE REQUIRED.     Note: Gram Stain Report Called to,Read Back By and Verified With: RACHEL [id number]  ON [id number] BY [facility]     Performed at [facility]   Report Status 08/02/2013 FINAL   Final  CULTURE, BLOOD (ROUTINE X 2)     Status: None   Collection Time    07/31/13 11:25 AM      Result Value Ref Range Status   Specimen Description BLOOD RIGHT ARM UPPER   Final   Special Requests BOTTLES DRAWN AEROBIC AND ANAEROBIC 5CC   Final   Culture  Setup Time     Final   Value: 07/31/2013 14:25     Performed at [facility]   Culture     Final   Value: DIPHTHEROIDS(CORYNEBACTERIUM SPECIES)     Note: Standardized susceptibility testing for this organism is not available.     Note: Gram Stain Report Called to,Read Back By and Verified With: MONICA CROSS ON 08/01/2013 AT 9:35P BY [facility]     Performed at [facility]   Report Status 08/03/2013 FINAL   Final  URINE CULTURE     Status: None   Collection Time    07/31/13 12:19 PM      Result Value Ref Range Status   Specimen Description URINE, CLEAN CATCH   Final   Special Requests NONE   Final   Culture  Setup Time     Final   Value:  07/31/2013 12:54     Performed at [place]     Final   Value: NO GROWTH     Performed at [facility]   Culture     Final   Value: NO GROWTH     Performed at [facility]   Report Status 08/01/2013 FINAL   Final  MRSA PCR SCREENING     Status: None   Collection Time    07/31/13  9:02 PM      Result Value Ref Range Status   MRSA by PCR NEGATIVE  NEGATIVE Final   Comment:            The GeneXpert MRSA Assay (FDA     approved for NASAL specimens     only), is one component of a     comprehensive MRSA colonization     surveillance program. It is not     intended to diagnose MRSA     infection nor to guide or     monitor treatment for     MRSA infections.     Studies:  Recent x-ray studies have been reviewed in detail by the Attending Physician  Scheduled Meds:  Scheduled Meds: . ciprofloxacin  400 mg Intravenous Q12H  . fentaNYL      . fentaNYL   Intravenous 6 times per day  . lidocaine  15 mL Mouth/Throat Once  . metronidazole  500 mg Intravenous Q8H  . midazolam      . vancomycin  1,250 mg Intravenous Q12H    Time spent on care of this patient: 35 mins   [place]  [phone] Pager - Text Page per Shea Evans as per below:  On-Call/Text Page:      [email or web address]      password [facility]  If 7PM-7AM, please contact night-coverage [email or web address] Password [email or web address] 08/05/2013, 2:20 PM   LOS: 5 days

## 2013-08-05 NOTE — Consult Note (Signed)
PULMONARY / CRITICAL CARE MEDICINE   Name: Leah Peterson MRN: [id number] DOB: 02/21/1956    ADMISSION DATE:  07/31/2013 PRIMARY SERVICE: PCCM LOS 5 days   CHIEF COMPLAINT:  Abdominal pain and diarrhea  BRIEF PATIENT DESCRIPTION: 59 yof with a PMH of diverticulitis (last treatment one year ago), HTN, IBS, and obesity who presents with abdominal pain and diarrhea noted to be hypotensive in ED with BP 80's/60's and mildly elevated lactic acid at 3.27.  Abd CT showed diverticulitis and free fluid collection with largest one 9.3 cm at left lobe of liver. Went for [place] bx of liver mass 4/6, returned and was hypotensive with fevers/chills, abdominal pain.  PCCM re-consulted.  SIGNIFICANT EVENTS / STUDIES:  4/2 [place] Abd: Solid mass in hepatic segment 2 of 10.3cm demonstrating internal vascularity. DDx includes hepatocellular carcinoma, metastatic disease, or benign neoplasms including focal nodular hyperplasia and adenoma. Adjacent subcapsular complex cystic structure with fluid levels favoring subcapsular hematoma-almost certainly related to the adjacent mass lesion. When pt clinically able, evaluation with MRI Abd w/wo contrast is recommended to further assess solid mass.  4/2 CT Abd/Pelvis: Partial SBO, transition point not definitive but appears in left anterior pelvis where there are inflammatory changes. Also, 5.5cm collection in left lower pelvis contiguous with uterus. Pt had diverticulosis in this location previously and may reflect peridiverticular abscess. Extensive diverticulosis with an area of wall thickening of mid sigmoid colon which could support diverticulitis. Small amt of ascites. There is focal fluid collection between left lobe of liver and stomach. Also a small mass/fluid collection in subcutaneous fat of upper abdominal midline measuring 2.5 x 2.1cm. There is a hypo attenuating mass-like lesion in lateral left lobe of liver measuring 9.3cm.   4/2 Repeat CT Abd/Pelvis with rectal  contrast: communication of the sigmoid colon lumen to a small and thick walled abscess with fistula demonstrated from the sigmoid colon lumen after administration of rectal contrast. The liquefied central portion of this abscess only measures 1.8 cm. Multitude of other areas of free fluid and loculated fluid are seen in the peritoneal cavity with the largest focal fluid in the gastrohepatic space under the left lobe of the liver. Persistent large mass in the left lobe of the liver with associated subcutaneous nodule which may represent metastatic disease in the midline anterior subcutaneous fat.  4/6 IR: Core bx of left lobe liver mass, aspiration of subq fluid collection, perc drainage of perihepatic fluid.  Returned to SDU hypotensive, tachycardic, with fevers/chills. PCCM called.   LINES / TUBES: JP abdominal drain 4/6 >>> R IJ CVL 4/2 >>> Foley 4/1 >>>  CULTURES: BC 4/1>>1/2 gpc in pairs, 2/2 gpr >>  UC 4/1>>NG  Abscess 4/6 >>> Hepatic mass/perihepatic fluid cytology 4/6 >>>  ANTIBIOTICS: Aztreonam 4/1>>4/2 Levaquin 4/1>>4/1 Metronidazole 4/1>>  Vancomycin 4/1>>  Cipro 4/2>>   SUBJECTIVE:   Is complaining of abdominal pain and fevers/chills.  States she is freezing cold but then quickly gets hot and begins to sweat. Denies chest pain, SOB.  VITAL SIGNS: Temp:  [98.3 F (36.8 C)-99.2 F (37.3 C)] 98.5 F (36.9 C) (04/06 1300) Pulse Rate:  [87-116] 116 (04/06 1300) Resp:  [12-40] 40 (04/06 1445) BP: (58-168)/(29-104) 93/75 mmHg (04/06 1445) SpO2:  [96 %-100 %] 99 % (04/06 1445) FiO2 (%):  [37 %] 37 % (04/05 1928)  HEMODYNAMICS:   VENTILATOR SETTINGS: Vent Mode:  [-]  FiO2 (%):  [37 %] 37 % INTAKE / OUTPUT: Intake/Output     04/05 0701 - 04/06 0700  04/06 0701 - 04/07 0700   I.V. (mL/kg) 750 (5.9) 901.7 (7.1)   Other  10   IV Piggyback 800 600   Total Intake(mL/kg) 1550 (12.2) 1511.7 (11.9)   Urine (mL/kg/hr) 1250 (0.4) 1400 (1.4)   Drains  500 (0.5)   Total Output  1250 1900   Net +300 -388.3         PHYSICAL EXAMINATION: General:  Pleasant obese female, resting in bed, in NAD.  Neuro:   A&O x 3, MAE. HEENT:  PERRL, EOMI.  Cardiovascular:  Tachy but regular, no M/R/G Lungs:  CTAB Abdomen:  obese, +BS; soft, JP drain in place - sanguinous fluid. Musculoskeletal:  no joint deformities Skin:  warm, dry; no rash  LABS:  PULMONARY No results found for this basename: PHART, PCO2, PCO2ART, PO2, PO2ART, HCO3, TCO2, O2SAT,  in the last 168 hours  CBC  Recent Labs Lab 08/02/13 0300 08/05/13 0500 08/05/13 1330  HGB 11.1* 10.9* 9.5*  HCT 32.0* 32.4* 28.6*  WBC 6.1 16.3* 16.0*  PLT 143* 158 196    COAGULATION  Recent Labs Lab 07/31/13 2320 08/02/13 1010 08/04/13 0500 08/04/13 1730  INR 1.61* 1.61* 1.55* 1.62*    CARDIAC    Recent Labs Lab 07/31/13 2320 08/01/13 0417  TROPONINI <0.30 <0.30    Recent Labs Lab 07/31/13 1100  PROBNP 3000.0*     CHEMISTRY  Recent Labs Lab 07/31/13 1100 08/01/13 0417  08/02/13 0300 08/02/13 1010 08/03/13 0539 08/04/13 0500 08/05/13 0500  NA 136* 138  < > 137 141 143 142 139  K 3.7 3.4*  < > 3.2* 3.4* 3.2* 3.5* 3.5*  CL 91* 98  < > 101 105 104 103 102  CO2 22 20  < > 20 21 23 25 26   GLUCOSE 142* 111*  < > 115* 113* 122* 120* 115*  BUN 47* 58*  < > 49* 43* 29* 21 15  CREATININE 4.22* 4.36*  < > 2.44* 1.94* 1.27* 0.99 0.81  CALCIUM 9.2 7.9*  < > 7.8* 7.9* 8.1* 8.3* 7.8*  MG  --  1.7  --  1.9  --  2.2 2.1  --   PHOS  --   --   --  4.1  --  3.1  --   --   < > = values in this interval not displayed. Estimated Creatinine Clearance: 98.5 ml/min (by C-G formula based on Cr of 0.81).   LIVER  Recent Labs Lab 07/31/13 1100 07/31/13 2320 08/02/13 0300 08/02/13 1010 08/04/13 0500 08/04/13 1730 08/05/13 0500  AST 92* 83* 87*  --  23  --  17  ALT 146* 129* 120*  --  53*  --  35  ALKPHOS 89 85 66  --  81  --  91  BILITOT 1.6* 1.1 0.9  --  0.7  --  0.8  PROT 8.1 6.7 6.3  --  6.1   --  6.0  ALBUMIN 3.0* 2.4* 2.0*  --  1.9*  --  1.9*  INR  --  1.61*  --  1.61* 1.55* 1.62*  --      INFECTIOUS  Recent Labs Lab 07/31/13 1139 07/31/13 2320 08/01/13 0417  LATICACIDVEN 3.27* 1.8 1.1     ENDOCRINE CBG (last 3)  No results found for this basename: GLUCAP,  in the last 72 hours  IMAGING x48h  [place] Abscess Drain  08/05/2013   CLINICAL DATA:  11 cm left hepatic mass, subcutaneous soft tissue nodule and perihepatic fluid collection by prior  CT.  EXAM: 1. ULTRASOUND-GUIDED CORE BIOPSY OF THE LIVER 2. ULTRASOUND-GUIDED ASPIRATION OF SUBCUTANEOUS FLUID COLLECTION 3. ULTRASOUND-GUIDED PERCUTANEOUS CATHETER DRAINAGE OF PERIHEPATIC ABSCESS  MEDICATIONS: 3.0 mg IV Versed; 100 mcg IV Fentanyl  Total Moderate Sedation Time: 24 minutes.  PROCEDURE: The procedure, risks, benefits, and alternatives were explained to the patient. Questions regarding the procedure were encouraged and answered. The patient understands and consents to the procedure.  The abdominal wall was prepped with Betadine in a sterile fashion, and a sterile drape was applied covering the operative field. A sterile gown and sterile gloves were used for the procedure. Local anesthesia was provided with 1% Lidocaine.  The liver, subcutaneous tissues and perihepatic region were first examined by ultrasound. An 18 gauge needle was advanced into the midline subcutaneous abdominal wall fluid collection and aspiration performed. Aspirated fluid sample was sent for culture analysis.  Ultrasound-guided biopsy was performed of a hepatic mass in the left lobe of the liver. A 17 gauge needle was advanced into the lesion. A total of 2 separate 18 gauge core biopsy samples were obtained and submitted in formalin. A slurry of sterile saline and Gel-Foam pledgets was injected through the outer needle as it was retracted out of the liver. Ultrasound was performed immediately post biopsy.  An 18 gauge needle was advanced into a left perihepatic/  subhepatic fluid collection. Aspiration was performed. A fluid sample was sent for culture. A guidewire was advanced into the collection. The tract was dilated and a 12 [place] percutaneous drain advanced into the collection. The drain was connected to a suction bulb. It was secured at the skin with a Prolene retention suture and StatLock device.  COMPLICATIONS: None.  FINDINGS: The midline subcutaneous abnormality superficial to the abdominal wall seen by CT corresponds to a complex fluid collection by ultrasound with some surrounding tissue thickening. Aspiration yielded grossly purulent fluid. The cavity was largely decompressed but needle aspiration and knee fluid sample sent for culture analysis.  The nearly 11 cm heterogeneous mass in the lateral segment of the left lobe of the liver was sampled, revealing solid tissue. There was considerable bleeding from the trocar needle after core biopsy and Gel-Foam pledgets were instilled as the outer needle was removed. No active bleeding was visualized from the surface of the liver by ultrasound immediately after completion of the biopsy.  The complex fluid collection adjacent to the left lobe of the liver and extending into the peritoneal cavity shows visible enlargement by ultrasound compared to a prior ultrasound 5 days ago with maximal dimensions of approximately 10 cm. This also appears to communicate with some additional complex fluid in the midline abdomen. Aspiration yielded bloody and turbid fluid which appeared likely infected. For this reason, a percutaneous drain was placed. A 12 French drain was placed over a wire and attached to a suction bulb. Fluid samples from this collection were sent for culture analysis as well as cytology.  IMPRESSION: 1. Aspiration of midline subcutaneous fluid collection yielded purulent fluid. The small collection was nearly completely decompressed by needle aspiration and a sample of fluid sent for culture analysis. 2.  Ultrasound-guided core biopsy was performed of the large left lobe hepatic mass. The percutaneous tract was embolized with Gel-Foam pledgets due to bleeding from the outer needle during the biopsy. There was no immediate evidence of active bleeding from the surface of the liver upon completion of the biopsy. Core samples were sent for histologic analysis. 3. Enlargement of complex fluid collection adjacent to the left  lobe of the liver and extending into the peritoneal cavity. Aspiration yielded bloody and turbid fluid which was sent for culture analysis and cytologic analysis. A 12 French percutaneous drain was placed in this collection and attached to a suction bulb. Output will be followed.   Electronically Signed   By: Aletta Edouard M.D.   On: 08/05/2013 13:45   US Biopsy  08/05/2013   CLINICAL DATA:  11 cm left hepatic mass, subcutaneous soft tissue nodule and perihepatic fluid collection by prior CT.  EXAM: 1. ULTRASOUND-GUIDED CORE BIOPSY OF THE LIVER 2. ULTRASOUND-GUIDED ASPIRATION OF SUBCUTANEOUS FLUID COLLECTION 3. ULTRASOUND-GUIDED PERCUTANEOUS CATHETER DRAINAGE OF PERIHEPATIC ABSCESS  MEDICATIONS: 3.0 mg IV Versed; 100 mcg IV Fentanyl  Total Moderate Sedation Time: 24 minutes.  PROCEDURE: The procedure, risks, benefits, and alternatives were explained to the patient. Questions regarding the procedure were encouraged and answered. The patient understands and consents to the procedure.  The abdominal wall was prepped with Betadine in a sterile fashion, and a sterile drape was applied covering the operative field. A sterile gown and sterile gloves were used for the procedure. Local anesthesia was provided with 1% Lidocaine.  The liver, subcutaneous tissues and perihepatic region were first examined by ultrasound. An 18 gauge needle was advanced into the midline subcutaneous abdominal wall fluid collection and aspiration performed. Aspirated fluid sample was sent for culture analysis.  Ultrasound-guided  biopsy was performed of a hepatic mass in the left lobe of the liver. A 17 gauge needle was advanced into the lesion. A total of 2 separate 18 gauge core biopsy samples were obtained and submitted in formalin. A slurry of sterile saline and Gel-Foam pledgets was injected through the outer needle as it was retracted out of the liver. Ultrasound was performed immediately post biopsy.  An 18 gauge needle was advanced into a left perihepatic/ subhepatic fluid collection. Aspiration was performed. A fluid sample was sent for culture. A guidewire was advanced into the collection. The tract was dilated and a 12 [place] percutaneous drain advanced into the collection. The drain was connected to a suction bulb. It was secured at the skin with a Prolene retention suture and StatLock device.  COMPLICATIONS: None.  FINDINGS: The midline subcutaneous abnormality superficial to the abdominal wall seen by CT corresponds to a complex fluid collection by ultrasound with some surrounding tissue thickening. Aspiration yielded grossly purulent fluid. The cavity was largely decompressed but needle aspiration and knee fluid sample sent for culture analysis.  The nearly 11 cm heterogeneous mass in the lateral segment of the left lobe of the liver was sampled, revealing solid tissue. There was considerable bleeding from the trocar needle after core biopsy and Gel-Foam pledgets were instilled as the outer needle was removed. No active bleeding was visualized from the surface of the liver by ultrasound immediately after completion of the biopsy.  The complex fluid collection adjacent to the left lobe of the liver and extending into the peritoneal cavity shows visible enlargement by ultrasound compared to a prior ultrasound 5 days ago with maximal dimensions of approximately 10 cm. This also appears to communicate with some additional complex fluid in the midline abdomen. Aspiration yielded bloody and turbid fluid which appeared likely  infected. For this reason, a percutaneous drain was placed. A 12 French drain was placed over a wire and attached to a suction bulb. Fluid samples from this collection were sent for culture analysis as well as cytology.  IMPRESSION: 1. Aspiration of midline subcutaneous fluid collection yielded purulent  fluid. The small collection was nearly completely decompressed by needle aspiration and a sample of fluid sent for culture analysis. 2. Ultrasound-guided core biopsy was performed of the large left lobe hepatic mass. The percutaneous tract was embolized with Gel-Foam pledgets due to bleeding from the outer needle during the biopsy. There was no immediate evidence of active bleeding from the surface of the liver upon completion of the biopsy. Core samples were sent for histologic analysis. 3. Enlargement of complex fluid collection adjacent to the left lobe of the liver and extending into the peritoneal cavity. Aspiration yielded bloody and turbid fluid which was sent for culture analysis and cytologic analysis. A 12 French percutaneous drain was placed in this collection and attached to a suction bulb. Output will be followed.   Electronically Signed   By: Aletta Edouard M.D.   On: 08/05/2013 13:45   US Aspiration  08/05/2013   CLINICAL DATA:  11 cm left hepatic mass, subcutaneous soft tissue nodule and perihepatic fluid collection by prior CT.  EXAM: 1. ULTRASOUND-GUIDED CORE BIOPSY OF THE LIVER 2. ULTRASOUND-GUIDED ASPIRATION OF SUBCUTANEOUS FLUID COLLECTION 3. ULTRASOUND-GUIDED PERCUTANEOUS CATHETER DRAINAGE OF PERIHEPATIC ABSCESS  MEDICATIONS: 3.0 mg IV Versed; 100 mcg IV Fentanyl  Total Moderate Sedation Time: 24 minutes.  PROCEDURE: The procedure, risks, benefits, and alternatives were explained to the patient. Questions regarding the procedure were encouraged and answered. The patient understands and consents to the procedure.  The abdominal wall was prepped with Betadine in a sterile fashion, and a sterile  drape was applied covering the operative field. A sterile gown and sterile gloves were used for the procedure. Local anesthesia was provided with 1% Lidocaine.  The liver, subcutaneous tissues and perihepatic region were first examined by ultrasound. An 18 gauge needle was advanced into the midline subcutaneous abdominal wall fluid collection and aspiration performed. Aspirated fluid sample was sent for culture analysis.  Ultrasound-guided biopsy was performed of a hepatic mass in the left lobe of the liver. A 17 gauge needle was advanced into the lesion. A total of 2 separate 18 gauge core biopsy samples were obtained and submitted in formalin. A slurry of sterile saline and Gel-Foam pledgets was injected through the outer needle as it was retracted out of the liver. Ultrasound was performed immediately post biopsy.  An 18 gauge needle was advanced into a left perihepatic/ subhepatic fluid collection. Aspiration was performed. A fluid sample was sent for culture. A guidewire was advanced into the collection. The tract was dilated and a 12 [place] percutaneous drain advanced into the collection. The drain was connected to a suction bulb. It was secured at the skin with a Prolene retention suture and StatLock device.  COMPLICATIONS: None.  FINDINGS: The midline subcutaneous abnormality superficial to the abdominal wall seen by CT corresponds to a complex fluid collection by ultrasound with some surrounding tissue thickening. Aspiration yielded grossly purulent fluid. The cavity was largely decompressed but needle aspiration and knee fluid sample sent for culture analysis.  The nearly 11 cm heterogeneous mass in the lateral segment of the left lobe of the liver was sampled, revealing solid tissue. There was considerable bleeding from the trocar needle after core biopsy and Gel-Foam pledgets were instilled as the outer needle was removed. No active bleeding was visualized from the surface of the liver by ultrasound  immediately after completion of the biopsy.  The complex fluid collection adjacent to the left lobe of the liver and extending into the peritoneal cavity shows visible enlargement by ultrasound  compared to a prior ultrasound 5 days ago with maximal dimensions of approximately 10 cm. This also appears to communicate with some additional complex fluid in the midline abdomen. Aspiration yielded bloody and turbid fluid which appeared likely infected. For this reason, a percutaneous drain was placed. A 12 French drain was placed over a wire and attached to a suction bulb. Fluid samples from this collection were sent for culture analysis as well as cytology.  IMPRESSION: 1. Aspiration of midline subcutaneous fluid collection yielded purulent fluid. The small collection was nearly completely decompressed by needle aspiration and a sample of fluid sent for culture analysis. 2. Ultrasound-guided core biopsy was performed of the large left lobe hepatic mass. The percutaneous tract was embolized with Gel-Foam pledgets due to bleeding from the outer needle during the biopsy. There was no immediate evidence of active bleeding from the surface of the liver upon completion of the biopsy. Core samples were sent for histologic analysis. 3. Enlargement of complex fluid collection adjacent to the left lobe of the liver and extending into the peritoneal cavity. Aspiration yielded bloody and turbid fluid which was sent for culture analysis and cytologic analysis. A 12 French percutaneous drain was placed in this collection and attached to a suction bulb. Output will be followed.   Electronically Signed   By: Aletta Edouard M.D.   On: 08/05/2013 13:45    ASSESSMENT / PLAN:  GASTROINTESTINAL A:   Sigmoid Fistula with Abscess Diverticulitis with Diverticulosis , Pelvic abscess - question due to contained diverticular rupture and SBO Liver mass s/p biopsy 4/6 Subq fluid collection in epigastrium  Concern for retroperitoneal  bleed P:   Will transfer to ICU Continue NPO STAT Abd/Pelvic CT to r/o retroperitoneal bleed Trend H/H q6 Type/Screen Follow up on hepatic mass/perihepatic fluid cytology.  PULMONARY A:  No acute issues. P:   Continue supplemental O2 PRN.  CARDIOVASCULAR A:  Hypotension H/O hypertension P:  R/o retroperitoneal bleed as above. 500 cc bolus now. Levophed gtt for goal MAP > 65. Holding anti-hypertensives.   RENAL A:   No acute issues. P:   Monitor BMET intermittently Monitor I/Os Correct electrolytes as indicated  HEMATOLOGIC A:   Anemia - Hgb down from 10.9 one day ago to 9.5 today. 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  Will order a CT of the abdomen and pelvis to R/O bleeding.  Will also type and screen 2 units.  Fluid resuscitate.  Check CVP.  Make levophed available as needed.  Keep central line in place and transfer patient to the ICU.  CC time 35 min.  Patient seen and examined, agree with above note.  I dictated the care and orders written for this patient under my direction.  Rush Farmer, MD [phone]

## 2013-08-05 NOTE — Progress Notes (Addendum)
[facility] P[facility] Progress Note    Subjective: Patient in no distress.  Abdominal pain is better.  Objective: Vital signs in last 24 hours: Temp:  [98.2 F (36.8 C)-99.2 F (37.3 C)] 98.7 F (37.1 C) (04/06 0815) Pulse Rate:  [86-101] 93 (04/06 0815) Resp:  [20-31] 31 (04/06 0815) BP: (128-166)/(67-92) 166/91 mmHg (04/06 0815) SpO2:  [98 %-100 %] 100 % (04/06 0815) FiO2 (%):  [37 %] 37 % (04/05 1928) Last BM Date: 07/28/13  Intake/Output from previous day: 04/05 0701 - 04/06 0700 In: 1550 [I.V.:750; IV Piggyback:800] Out: 1250 [Urine:1250] Intake/Output this shift: Total I/O In: 1026.7 [I.V.:826.7; IV Piggyback:200] Out: 1000 [Urine:1000]  General: No acute distress  Lungs: Clear  Abd: Soft, good bowel sounds.  Minimally tender.  Can still palpate mass.  To get biopsy today.  Extremities: No changes  Neuro: Intact  Lab Results:  @LABLAST2 (wbc:2,hgb:2,hct:2,plt:2) BMET  Recent Labs  08/04/13 0500 08/05/13 0500  NA 142 139  K 3.5* 3.5*  CL 103 102  CO2 25 26  GLUCOSE 120* 115*  BUN 21 15  CREATININE 0.99 0.81  CALCIUM 8.3* 7.8*   PT/INR  Recent Labs  08/04/13 0500 08/04/13 1730  LABPROT 18.2* 18.8*  INR 1.55* 1.62*   ABG No results found for this basename: PHART, PCO2, PO2, HCO3,  in the last 72 hours  Studies/Results: No results found.  Anti-infectives: Anti-infectives   Start     Dose/Rate Route Frequency Ordered Stop   08/04/13 0500  vancomycin (VANCOCIN) 1,250 mg in sodium chloride 0.9 % 250 mL IVPB     1,250 mg 166.7 mL/hr over 90 Minutes Intravenous Every 12 hours 08/03/13 1507     08/02/13 1200  levofloxacin (LEVAQUIN) IVPB 500 mg  Status:  Discontinued     500 mg 100 mL/hr over 60 Minutes Intravenous Every 48 hours 07/31/13 1812 07/31/13 2002   08/02/13 1000  ciprofloxacin (CIPRO) IVPB 400 mg     400 mg 200 mL/hr over 60 Minutes Intravenous Every 12 hours 08/02/13 0911     08/02/13 0400  vancomycin (VANCOCIN) 1,750 mg in sodium chloride  0.9 % 500 mL IVPB  Status:  Discontinued     1,750 mg 250 mL/hr over 120 Minutes Intravenous Every 24 hours 08/02/13 0357 08/03/13 1507   08/01/13 1000  ciprofloxacin (CIPRO) IVPB 400 mg  Status:  Discontinued     400 mg 200 mL/hr over 60 Minutes Intravenous Every 24 hours 08/01/13 0959 08/02/13 0911   07/31/13 2015  vancomycin (VANCOCIN) 2,500 mg in sodium chloride 0.9 % 500 mL IVPB     2,500 mg 250 mL/hr over 120 Minutes Intravenous  Once 07/31/13 2000 07/31/13 2346   07/31/13 2000  metroNIDAZOLE (FLAGYL) IVPB 500 mg     500 mg 100 mL/hr over 60 Minutes Intravenous Every 8 hours 07/31/13 1812     07/31/13 1830  aztreonam (AZACTAM) 1 g in dextrose 5 % 50 mL IVPB  Status:  Discontinued     1 g 100 mL/hr over 30 Minutes Intravenous Every 8 hours 07/31/13 1815 08/01/13 0958   07/31/13 1130  levofloxacin (LEVAQUIN) IVPB 750 mg     750 mg 100 mL/hr over 90 Minutes Intravenous  Once 07/31/13 1123 07/31/13 1310   07/31/13 1130  metroNIDAZOLE (FLAGYL) IVPB 500 mg     500 mg 100 mL/hr over 60 Minutes Intravenous  Once 07/31/13 1123 07/31/13 1310      Assessment/Plan: s/p  Concerning is her WBC increase today up to 16K.  No feverl.  She has a right IJ line that has been in place for 5 days.  Will check UA and urine culture. Repeat CBC tomorrow. I would suggest [place] the PCA and placing patient on PRN doses of pain medications to see if she is getting better.  Now she is using PCA just when she is getting out of bed.  LOS: 5 days   [name]. Dahlia Bailiff, MD, FACS [phone] [facility] 08/05/2013

## 2013-08-05 NOTE — Progress Notes (Signed)
08/05/13 2315 450mg /21ml of fent pca syringe wasted in sink with michelle toler rn.

## 2013-08-05 NOTE — Progress Notes (Signed)
Agree with assessment.  Patient seen and examined.  Not severe abdominal pain or tenderness.  Likely has had some bleeding related to liver biopsy.  May also be component of sepsis.  CT of abdomen and pelvis pending.

## 2013-08-05 NOTE — Sedation Documentation (Signed)
200cc bloody fluid emptied from JP suction bulb upon completion of procedure

## 2013-08-05 NOTE — H&P (Signed)
Leah Peterson is an 58 y.o. female.   Chief Complaint: Admitted with abd pain/N/V Diverticulitis -- undergoing treatment Work up revealed Hepatic lesion and subcutaneous epigastric lesion Dr Kathlene Cote has reviewed films and agreeable to biopsy one of these lesions  HPI: Diverticulitis; HLD; HTN; IBS; obese  Past Medical History  Diagnosis Date  . Diverticulosis of colon (without mention of hemorrhage)   . Pure hypercholesterolemia   . Other abnormal glucose   . Essential hypertension, benign   . IBS (irritable bowel syndrome)   . Obesity, unspecified   . Unspecified vitamin D deficiency   . Left knee pain 2012    eval by ortho - bone spurs and DJD, treated with steroid shot which helped temporarily    Past Surgical History  Procedure Laterality Date  . Cesarean section      due to BP elevation  . Colonoscopy  2000    Diverticuli-Dr. Sharlett Iles    Family History  Problem Relation Age of Onset  . Hypertension Father   . Cancer Father     prostate  . Heart disease Father     CABG x 2  . Cancer Mother     Lung-quit smoking 20 years prior to Dx  . Hypertension Mother   . Diabetes      + family hx  . Drug abuse Brother   . Stroke Maternal Grandmother   . Sleep apnea Brother     with [facility] estranged   Social History:  reports that she has quit smoking. She has never used smokeless tobacco. She reports that she drinks alcohol. She reports that she does not use illicit drugs.  Allergies:  Allergies  Allergen Reactions  . Lisinopril     REACTION: lethargy, cough.  . Penicillins     REACTION: LYMPH NODES SWELL    Medications Prior to Admission  Medication Sig Dispense Refill  . amLODipine (NORVASC) 5 MG tablet Take 5 mg by mouth daily.      . cetirizine (ZYRTEC) 10 MG tablet Take 10 mg by mouth daily as needed.        . cholecalciferol (VITAMIN D) 1000 UNITS tablet Take 1,000 Units by mouth daily.       . Multiple Vitamin (MULTIVITAMIN WITH MINERALS) TABS tablet  Take 1 tablet by mouth daily.      Marland Kitchen triamterene-hydrochlorothiazide (MAXZIDE-25) 37.5-25 MG per tablet Take 1 tablet by mouth daily.      Marland Kitchen dicyclomine (BENTYL) 10 MG capsule Take 1 capsule (10 mg total) by mouth 4 (four) times daily as needed.  30 capsule  1    Results for orders placed during the hospital encounter of 07/31/13 (from the past 48 hour(s))  AFP TUMOR MARKER     Status: None   Collection Time    08/04/13  5:00 AM      Result Value Ref Range   AFP-Tumor Marker <1.3  0.0 - 8.0 ng/mL   Comment: (NOTE)     The [employer] Centaur AFP immunoassay method is used.  Results obtained     with different assay methods or kits cannot be used interchangeably.     AFP is a valuable aid in the management of nonseminomatous testicular     cancer patients when used in conjunction with information available     from the clinical evaluation and other diagnostic procedures.     Increased serum AFP concentrations have also been observed in ataxia     telangiectasia, hereditary tyrosinemia, primary hepatocellular  carcinoma, teratocarcinoma, gastrointestinal tract cancers with and     without liver metastases, and in benign hepatic conditions such as     acute viral hepatitis, chronic active hepatitis, and cirrhosis.  This     result cannot be interpreted as absolute evidence of the presence or     absence of malignant disease.  This result is not interpretable in     pregnant females.     Performed at [facility]     Status: Abnormal   Collection Time    08/04/13  5:00 AM      Result Value Ref Range   Sodium 142  137 - 147 mEq/L   Potassium 3.5 (*) 3.7 - 5.3 mEq/L   Chloride 103  96 - 112 mEq/L   CO2 25  19 - 32 mEq/L   Glucose, Bld 120 (*) 70 - 99 mg/dL   BUN 21  6 - 23 mg/dL   Creatinine, Ser 0.99  0.50 - 1.10 mg/dL   Calcium 8.3 (*) 8.4 - 10.5 mg/dL   GFR calc non Af Amer 63 (*) >90 mL/min   GFR calc Af Amer 73 (*) >90 mL/min   Comment: (NOTE)      The eGFR has been calculated using the CKD EPI equation.     This calculation has not been validated in all clinical situations.     eGFR's persistently <90 mL/min signify possible Chronic Kidney     Disease.  HEPATIC FUNCTION PANEL     Status: Abnormal   Collection Time    08/04/13  5:00 AM      Result Value Ref Range   Total Protein 6.1  6.0 - 8.3 g/dL   Albumin 1.9 (*) 3.5 - 5.2 g/dL   AST 23  0 - 37 U/L   ALT 53 (*) 0 - 35 U/L   Alkaline Phosphatase 81  39 - 117 U/L   Total Bilirubin 0.7  0.3 - 1.2 mg/dL   Bilirubin, Direct 0.4 (*) 0.0 - 0.3 mg/dL   Indirect Bilirubin 0.3  0.3 - 0.9 mg/dL  MAGNESIUM     Status: None   Collection Time    08/04/13  5:00 AM      Result Value Ref Range   Magnesium 2.1  1.5 - 2.5 mg/dL  PROTIME-INR     Status: Abnormal   Collection Time    08/04/13  5:00 AM      Result Value Ref Range   Prothrombin Time 18.2 (*) 11.6 - 15.2 seconds   INR 1.55 (*) 0.00 - 1.49  PROTIME-INR     Status: Abnormal   Collection Time    08/04/13  5:30 PM      Result Value Ref Range   Prothrombin Time 18.8 (*) 11.6 - 15.2 seconds   INR 1.62 (*) 0.00 - 1.49  CBC     Status: Abnormal   Collection Time    08/05/13  5:00 AM      Result Value Ref Range   WBC 16.3 (*) 4.0 - 10.5 K/uL   RBC 4.09  3.87 - 5.11 MIL/uL   Hemoglobin 10.9 (*) 12.0 - 15.0 g/dL   HCT 32.4 (*) 36.0 - 46.0 %   MCV 79.2  78.0 - 100.0 fL   MCH 26.7  26.0 - 34.0 pg   MCHC 33.6  30.0 - 36.0 g/dL   RDW 15.6 (*) 11.5 - 15.5 %   Platelets 158  150 - 400 K/uL  COMPREHENSIVE METABOLIC PANEL     Status: Abnormal   Collection Time    08/05/13  5:00 AM      Result Value Ref Range   Sodium 139  137 - 147 mEq/L   Potassium 3.5 (*) 3.7 - 5.3 mEq/L   Chloride 102  96 - 112 mEq/L   CO2 26  19 - 32 mEq/L   Glucose, Bld 115 (*) 70 - 99 mg/dL   BUN 15  6 - 23 mg/dL   Creatinine, Ser 0.81  0.50 - 1.10 mg/dL   Calcium 7.8 (*) 8.4 - 10.5 mg/dL   Total Protein 6.0  6.0 - 8.3 g/dL   Albumin 1.9 (*) 3.5 - 5.2  g/dL   AST 17  0 - 37 U/L   ALT 35  0 - 35 U/L   Alkaline Phosphatase 91  39 - 117 U/L   Total Bilirubin 0.8  0.3 - 1.2 mg/dL   GFR calc non Af Amer 80 (*) >90 mL/min   GFR calc Af Amer >90  >90 mL/min   Comment: (NOTE)     The eGFR has been calculated using the CKD EPI equation.     This calculation has not been validated in all clinical situations.     eGFR's persistently <90 mL/min signify possible Chronic Kidney     Disease.  APTT     Status: None   Collection Time    08/05/13  5:00 AM      Result Value Ref Range   aPTT 37  24 - 37 seconds   Comment:            IF BASELINE aPTT IS ELEVATED,     SUGGEST PATIENT RISK ASSESSMENT     BE USED TO DETERMINE APPROPRIATE     ANTICOAGULANT THERAPY.   No results found.  Review of Systems  Constitutional: Negative for fever.  Respiratory: Positive for shortness of breath.   Cardiovascular: Negative for chest pain.  Gastrointestinal: Positive for nausea. Negative for vomiting and abdominal pain.  Neurological: Positive for weakness and headaches.    Blood pressure 166/91, pulse 93, temperature 98.7 F (37.1 C), temperature source Oral, resp. rate 31, height '5\' 1"'  (1.549 m), weight 127.007 kg (280 lb), SpO2 100.00%. Physical Exam  Constitutional: She is oriented to person, place, and time.  obese  Cardiovascular: Normal rate and regular rhythm.   No murmur heard. Respiratory: She is in respiratory distress.  GI: Soft. Bowel sounds are normal. There is no tenderness.  Musculoskeletal: Normal range of motion.  Neurological: She is alert and oriented to person, place, and time.  Skin: Skin is warm.  Psychiatric: She has a normal mood and affect. Her behavior is normal. Judgment and thought content normal.     Assessment/Plan Liver lesion subcu epigastric lesion Request for biopsy per [facility] Pt aware of procedure benefits and risks and agreeable to proceed Consent signed andin chart INR 1.6 Wbc 16---Dr Kathlene Cote  aware  Eveline Sauve A 08/05/2013, 9:21 AM

## 2013-08-05 NOTE — Progress Notes (Signed)
Pt arrived from IR bp low, MD paged concerning low bp, orders received, PCA on hold, will continue to monitor closely.  Dr. Dorothy Puffer at bedside.

## 2013-08-05 NOTE — Sedation Documentation (Signed)
pca paused for procedure

## 2013-08-05 NOTE — Progress Notes (Signed)
eLink Physician-Brief Progress Note Patient Name: Leah Peterson DOB: [birth year] MRN: [id number]  Date of Service  08/05/2013   HPI/Events of Note   Pt with s/p liver bx and drainage with elevated INR.  Post procedure shock state and falling hgb  eICU Interventions  Transfuse one unit prbc    Intervention Category Major Interventions: Hemorrhage - evaluation and management  Asencion Noble 08/05/2013, 5:54 PM

## 2013-08-05 NOTE — Procedures (Signed)
Procedures:  1)  Core biopsy of left lobe liver mass, 2)  Aspiration of subcutaneous fluid collection, 3) Percutaneous drainage of perihepatic fluid.  All under ultrasound guidance. Findings:  Left lobe liver mass 18 G core bx x 2 via 17 G needle.  Tract embolized with Gelfoam pledgets.  Very vascular mass:  ? Giant atypical hemangioma by [place]. Subcutaneous abnormality of midline abdominal wall yielded purulent appearing fluid:  5 mL aspirated and sent for culture. Large, complex and loculated perihepatic fluid collection aspirated, yielding bloody, turbid fluid.  Given nature of fluid, 12 Fr drain placed over wire and attached to suction bulb.  ?infected hematoma vs abscess.  Fluid samples from this collection sent for culture and cytology.

## 2013-08-05 NOTE — H&P (Signed)
Agree.  Will assess by [place] prior to biopsy.  Likely subcu mass will be amenable to biopsy.

## 2013-08-06 ENCOUNTER — Other Ambulatory Visit: Payer: Self-pay

## 2013-08-06 DIAGNOSIS — R109 Unspecified abdominal pain: Secondary | ICD-10-CM

## 2013-08-06 DIAGNOSIS — K651 Peritoneal abscess: Secondary | ICD-10-CM

## 2013-08-06 LAB — CBC
HCT: 25.1 % — ABNORMAL LOW (ref 36.0–46.0)
HEMATOCRIT: 24.9 % — AB (ref 36.0–46.0)
Hemoglobin: 8.3 g/dL — ABNORMAL LOW (ref 12.0–15.0)
Hemoglobin: 8.4 g/dL — ABNORMAL LOW (ref 12.0–15.0)
MCH: 26.3 pg (ref 26.0–34.0)
MCH: 26.4 pg (ref 26.0–34.0)
MCHC: 33.3 g/dL (ref 30.0–36.0)
MCHC: 33.5 g/dL (ref 30.0–36.0)
MCV: 78.8 fL (ref 78.0–100.0)
MCV: 78.9 fL (ref 78.0–100.0)
PLATELETS: 178 10*3/uL (ref 150–400)
PLATELETS: 184 10*3/uL (ref 150–400)
RBC: 3.16 MIL/uL — ABNORMAL LOW (ref 3.87–5.11)
RBC: 3.18 MIL/uL — ABNORMAL LOW (ref 3.87–5.11)
RDW: 15.7 % — AB (ref 11.5–15.5)
RDW: 15.8 % — AB (ref 11.5–15.5)
WBC: 23.9 10*3/uL — AB (ref 4.0–10.5)
WBC: 25 10*3/uL — ABNORMAL HIGH (ref 4.0–10.5)

## 2013-08-06 LAB — CBC WITH DIFFERENTIAL/PLATELET
Basophils Absolute: 0.3 10*3/uL — ABNORMAL HIGH (ref 0.0–0.1)
Basophils Relative: 1 % (ref 0–1)
EOS ABS: 0.3 10*3/uL (ref 0.0–0.7)
Eosinophils Relative: 1 % (ref 0–5)
HCT: 23.8 % — ABNORMAL LOW (ref 36.0–46.0)
Hemoglobin: 8 g/dL — ABNORMAL LOW (ref 12.0–15.0)
LYMPHS ABS: 2.3 10*3/uL (ref 0.7–4.0)
Lymphocytes Relative: 9 % — ABNORMAL LOW (ref 12–46)
MCH: 26.4 pg (ref 26.0–34.0)
MCHC: 33.6 g/dL (ref 30.0–36.0)
MCV: 78.5 fL (ref 78.0–100.0)
Monocytes Absolute: 1.3 10*3/uL — ABNORMAL HIGH (ref 0.1–1.0)
Monocytes Relative: 5 % (ref 3–12)
NEUTROS PCT: 84 % — AB (ref 43–77)
Neutro Abs: 21.3 10*3/uL — ABNORMAL HIGH (ref 1.7–7.7)
PLATELETS: 176 10*3/uL (ref 150–400)
RBC: 3.03 MIL/uL — ABNORMAL LOW (ref 3.87–5.11)
RDW: 15.7 % — AB (ref 11.5–15.5)
WBC: 25.5 10*3/uL — ABNORMAL HIGH (ref 4.0–10.5)

## 2013-08-06 LAB — BASIC METABOLIC PANEL
BUN: 24 mg/dL — ABNORMAL HIGH (ref 6–23)
CALCIUM: 7.8 mg/dL — AB (ref 8.4–10.5)
CO2: 23 mEq/L (ref 19–32)
CREATININE: 1.17 mg/dL — AB (ref 0.50–1.10)
Chloride: 107 mEq/L (ref 96–112)
GFR calc non Af Amer: 51 mL/min — ABNORMAL LOW (ref >90)
GFR, EST AFRICAN AMERICAN: 60 mL/min — AB (ref >90)
Glucose, Bld: 98 mg/dL (ref 70–99)
Potassium: 3.6 mEq/L — ABNORMAL LOW (ref 3.7–5.3)
SODIUM: 143 meq/L (ref 137–147)

## 2013-08-06 LAB — HEPATITIS B E ANTIBODY: HEP B E AB: NONREACTIVE

## 2013-08-06 LAB — PREPARE FRESH FROZEN PLASMA
Unit division: 0
Unit division: 0

## 2013-08-06 LAB — COMPREHENSIVE METABOLIC PANEL
ALT: 48 U/L — AB (ref 0–35)
AST: 84 U/L — AB (ref 0–37)
Albumin: 1.8 g/dL — ABNORMAL LOW (ref 3.5–5.2)
Alkaline Phosphatase: 78 U/L (ref 39–117)
BUN: 24 mg/dL — ABNORMAL HIGH (ref 6–23)
CALCIUM: 7.2 mg/dL — AB (ref 8.4–10.5)
CO2: 24 meq/L (ref 19–32)
CREATININE: 1.19 mg/dL — AB (ref 0.50–1.10)
Chloride: 105 mEq/L (ref 96–112)
GFR, EST AFRICAN AMERICAN: 58 mL/min — AB (ref >90)
GFR, EST NON AFRICAN AMERICAN: 50 mL/min — AB (ref >90)
Glucose, Bld: 112 mg/dL — ABNORMAL HIGH (ref 70–99)
Potassium: 3.7 mEq/L (ref 3.7–5.3)
SODIUM: 142 meq/L (ref 137–147)
Total Bilirubin: 1 mg/dL (ref 0.3–1.2)
Total Protein: 5.4 g/dL — ABNORMAL LOW (ref 6.0–8.3)

## 2013-08-06 LAB — URINE CULTURE
CULTURE: NO GROWTH
Colony Count: NO GROWTH
Special Requests: NORMAL

## 2013-08-06 LAB — HEMOGLOBIN AND HEMATOCRIT, BLOOD
HCT: 23.3 % — ABNORMAL LOW (ref 36.0–46.0)
HCT: 24.7 % — ABNORMAL LOW (ref 36.0–46.0)
HCT: 25.2 % — ABNORMAL LOW (ref 36.0–46.0)
HEMOGLOBIN: 7.7 g/dL — AB (ref 12.0–15.0)
Hemoglobin: 8.3 g/dL — ABNORMAL LOW (ref 12.0–15.0)
Hemoglobin: 8.6 g/dL — ABNORMAL LOW (ref 12.0–15.0)

## 2013-08-06 LAB — PROTIME-INR
INR: 1.55 — AB (ref 0.00–1.49)
Prothrombin Time: 18.2 seconds — ABNORMAL HIGH (ref 11.6–15.2)

## 2013-08-06 LAB — HEPATITIS B E ANTIGEN: Hep B E Ag: NONREACTIVE

## 2013-08-06 LAB — PHOSPHORUS
PHOSPHORUS: 4 mg/dL (ref 2.3–4.6)
Phosphorus: 2.9 mg/dL (ref 2.3–4.6)

## 2013-08-06 LAB — MAGNESIUM
MAGNESIUM: 1.9 mg/dL (ref 1.5–2.5)
Magnesium: 1.8 mg/dL (ref 1.5–2.5)

## 2013-08-06 LAB — PREPARE RBC (CROSSMATCH)

## 2013-08-06 LAB — VANCOMYCIN, TROUGH: Vancomycin Tr: 20.9 ug/mL — ABNORMAL HIGH (ref 10.0–20.0)

## 2013-08-06 MED ORDER — VANCOMYCIN HCL IN DEXTROSE 1-5 GM/200ML-% IV SOLN
1000.0000 mg | INTRAVENOUS | Status: AC
  Administered 2013-08-06: 1000 mg via INTRAVENOUS
  Filled 2013-08-06: qty 200

## 2013-08-06 MED ORDER — POTASSIUM CHLORIDE CRYS ER 20 MEQ PO TBCR
40.0000 meq | EXTENDED_RELEASE_TABLET | Freq: Once | ORAL | Status: AC
  Administered 2013-08-06: 40 meq via ORAL
  Filled 2013-08-06: qty 2

## 2013-08-06 MED ORDER — VANCOMYCIN HCL IN DEXTROSE 1-5 GM/200ML-% IV SOLN
1000.0000 mg | Freq: Two times a day (BID) | INTRAVENOUS | Status: DC
  Administered 2013-08-07: 1000 mg via INTRAVENOUS
  Filled 2013-08-06 (×2): qty 200

## 2013-08-06 NOTE — Progress Notes (Signed)
PULMONARY / CRITICAL CARE MEDICINE   Name: Leah Peterson MRN: [id number] DOB: 11-29-1955    ADMISSION DATE:  07/31/2013 PRIMARY SERVICE: PCCM LOS 6 days   CHIEF COMPLAINT:  Abdominal pain and diarrhea  BRIEF PATIENT DESCRIPTION: 44 yof with a PMH of diverticulitis (last treatment one year ago), HTN, IBS, and obesity who presents with abdominal pain and diarrhea noted to be hypotensive in ED with BP 80's/60's and mildly elevated lactic acid at 3.27.  Abd CT showed diverticulitis and free fluid collection with largest one 9.3 cm at left lobe of liver. Went for [place] bx of liver mass 4/6, returned and was hypotensive with fevers/chills, abdominal pain.  PCCM re-consulted.  SIGNIFICANT EVENTS / STUDIES:  4/2 [place] Abd: Solid mass in hepatic segment 2 of 10.3cm demonstrating internal vascularity. DDx includes hepatocellular carcinoma, metastatic disease, or benign neoplasms including focal nodular hyperplasia and adenoma. Adjacent subcapsular complex cystic structure with fluid levels favoring subcapsular hematoma-almost certainly related to the adjacent mass lesion. When pt clinically able, evaluation with MRI Abd w/wo contrast is recommended to further assess solid mass.  4/2 CT Abd/Pelvis: Partial SBO, transition point not definitive but appears in left anterior pelvis where there are inflammatory changes. Also, 5.5cm collection in left lower pelvis contiguous with uterus. Pt had diverticulosis in this location previously and may reflect peridiverticular abscess. Extensive diverticulosis with an area of wall thickening of mid sigmoid colon which could support diverticulitis. Small amt of ascites. There is focal fluid collection between left lobe of liver and stomach. Also a small mass/fluid collection in subcutaneous fat of upper abdominal midline measuring 2.5 x 2.1cm. There is a hypo attenuating mass-like lesion in lateral left lobe of liver measuring 9.3cm.   4/2 Repeat CT Abd/Pelvis with rectal  contrast: communication of the sigmoid colon lumen to a small and thick walled abscess with fistula demonstrated from the sigmoid colon lumen after administration of rectal contrast. The liquefied central portion of this abscess only measures 1.8 cm. Multitude of other areas of free fluid and loculated fluid are seen in the peritoneal cavity with the largest focal fluid in the gastrohepatic space under the left lobe of the liver. Persistent large mass in the left lobe of the liver with associated subcutaneous nodule which may represent metastatic disease in the midline anterior subcutaneous fat.  4/6 IR: Core bx of left lobe liver mass, aspiration of subq fluid collection, perc drainage of perihepatic fluid.  Returned to SDU hypotensive, tachycardic, with fevers/chills. PCCM called.  4/7 CT Abd/Pelvis wo contrast:  Interval placement of pigtail drainage catheter in the subhepatic region inferior to the left hepatic lobe. There is a small amt of bleeding into the left hepatic lobe mass. New gas within the mass is compatible with Gel-Foam from procedure or earlier today. Small anterior left hepatic lobe subcapsular hematoma with hematocrit level. Moderate hemoperitoneum is new. Probable abscess in the anterior anatomic pelvis superior to the dome of the urinary bladder.    LINES / TUBES: JP abdominal drain 4/6 >>> R IJ CVL 4/2 >>> Foley 4/1 >>>  CULTURES:  MRSA 4/6>> NEG BC 4/1>>1/2 gpc in pairs, 2/2 gpr>>2/2 Cornyebacterium sp UC 4/1>>NG FINAL Abscess 4/6 >>> Hepatic mass/perihepatic fluid cytology 4/6 >>>  ANTIBIOTICS: Aztreonam 4/1>>4/2 Levaquin 4/1>>4/1 Metronidazole 4/1>>  Vancomycin 4/1>>  Cipro 4/2>>   SUBJECTIVE:   Denies any abdominal or fever/chills.  Denies CP, SOB.  VITAL SIGNS: Temp:  [97.6 F (36.4 C)-100 F (37.8 C)] 98.7 F (37.1 C) (04/07 0836) Pulse  Rate:  [87-126] 99 (04/07 0730) Resp:  [12-42] 29 (04/07 0730) BP: (58-168)/(29-104) 130/70 mmHg (04/07 0730) SpO2:   [96 %-100 %] 96 % (04/07 0730) Weight:  [309 lb 15.5 oz (140.6 kg)] 309 lb 15.5 oz (140.6 kg) (04/06 1800)  HEMODYNAMICS: CVP:  [6 mmHg-7 mmHg] 7 mmHg VENTILATOR SETTINGS:   INTAKE / OUTPUT: Intake/Output     04/06 0701 - 04/07 0700 04/07 0701 - 04/08 0700   I.V. (mL/kg) 3901.7 (27.8)    Blood 721.5    Other 10    IV Piggyback 1500    Total Intake(mL/kg) 6133.2 (43.6)    Urine (mL/kg/hr) 2000 (0.6)    Drains 580 (0.2)    Total Output 2580     Net +3553.2           PHYSICAL EXAMINATION: General:  Pleasant obese female, resting in bed, in NAD.  Neuro:   A&O x 3 HEENT:  PERRL, EOMI.  Cardiovascular:  Tachy but regular, no M/R/G Lungs:  CTAB Abdomen:  obese, +BS; soft, JP drain in place - sanguinous fluid. Skin:  warm, dry; no rash  LABS:  PULMONARY No results found for this basename: PHART, PCO2, PCO2ART, PO2, PO2ART, HCO3, TCO2, O2SAT,  in the last 168 hours  CBC  Recent Labs Lab 08/05/13 0500 08/05/13 1330  08/06/13 0240 08/06/13 0430 08/06/13 0800  HGB 10.9* 9.5*  < > 8.0* 7.7* 8.3*  HCT 32.4* 28.6*  < > 23.8* 23.3* 24.7*  WBC 16.3* 16.0*  --  25.5*  --   --   PLT 158 196  --  176  --   --   < > = values in this interval not displayed.  COAGULATION  Recent Labs Lab 08/02/13 1010 08/04/13 0500 08/04/13 1730 08/05/13 2013 08/06/13 0240  INR 1.61* 1.55* 1.62* 1.71* 1.55*    CARDIAC    Recent Labs Lab 07/31/13 2320 08/01/13 0417  TROPONINI <0.30 <0.30    Recent Labs Lab 07/31/13 1100  PROBNP 3000.0*     CHEMISTRY  Recent Labs Lab 08/01/13 0417  08/02/13 0300 08/02/13 1010 08/03/13 0539 08/04/13 0500 08/05/13 0500 08/06/13 0240  NA 138  < > 137 141 143 142 139 142  K 3.4*  < > 3.2* 3.4* 3.2* 3.5* 3.5* 3.7  CL 98  < > 101 105 104 103 102 105  CO2 20  < > 20 21 23 25 26 24   GLUCOSE 111*  < > 115* 113* 122* 120* 115* 112*  BUN 58*  < > 49* 43* 29* 21 15 24*  CREATININE 4.36*  < > 2.44* 1.94* 1.27* 0.99 0.81 1.19*  CALCIUM 7.9*   < > 7.8* 7.9* 8.1* 8.3* 7.8* 7.2*  MG 1.7  --  1.9  --  2.2 2.1  --  1.8  PHOS  --   --  4.1  --  3.1  --   --  4.0  < > = values in this interval not displayed. Estimated Creatinine Clearance: 71.6 ml/min (by C-G formula based on Cr of 1.19).   LIVER  Recent Labs Lab 07/31/13 2320 08/02/13 0300 08/02/13 1010 08/04/13 0500 08/04/13 1730 08/05/13 0500 08/05/13 2013 08/06/13 0240  AST 83* 87*  --  23  --  17  --  84*  ALT 129* 120*  --  53*  --  35  --  48*  ALKPHOS 85 66  --  81  --  91  --  78  BILITOT 1.1 0.9  --  0.7  --  0.8  --  1.0  PROT 6.7 6.3  --  6.1  --  6.0  --  5.4*  ALBUMIN 2.4* 2.0*  --  1.9*  --  1.9*  --  1.8*  INR 1.61*  --  1.61* 1.55* 1.62*  --  1.71* 1.55*     INFECTIOUS  Recent Labs Lab 07/31/13 1139 07/31/13 2320 08/01/13 0417  LATICACIDVEN 3.27* 1.8 1.1     ENDOCRINE CBG (last 3)   Recent Labs  08/05/13 1708  GLUCAP 114*    IMAGING x48h  Ct Abdomen Pelvis Wo Contrast  08/05/2013   CLINICAL DATA:  Evaluate intra-abdominal hemorrhage. Angiogram this morning. Known liver mass.  EXAM: CT ABDOMEN AND PELVIS WITHOUT CONTRAST  TECHNIQUE: Multidetector CT imaging of the abdomen and pelvis was performed following the standard protocol without intravenous contrast.  COMPARISON:  US BIOPSY dated 08/05/2013; CT ABD/PELV WO CM dated 08/01/2013; CT ABD/PELV WO CM dated 07/31/2013  FINDINGS: Small bilateral pleural effusions with bilateral lower lobe atelectasis.  There is a pigtail drainage catheter in the midline of the upper abdomen, extending inferior to the right hepatic lobe. The previously seen sub hepatic fluid collection is being drained by the pigtail drainage catheter and collection is now decompressed. There is gas within the left hepatic lobe mass. Left hepatic lobe mass measures 10 cm AP x 13.8 cm transverse. This is larger than on the prior examination and there is some increased attenuation within the mass, some of which represents Gel-Foam and  some of which represents hemorrhage associated with instrumentation earlier today. There is an anterior left hepatic lobe subcapsular hematoma which is difficult to clearly define but measures 3.3 cm in thickness with layering hematocrit level.  In the anatomic pelvis, there is an air-fluid level which appears distinct from the collapsed urinary bladder which has a Foley catheter. This may be within the deep pelvic abscess anterior to the uterus however the pelvis is poorly visualized in the absence of IV contrast. Hemo peritoneum is present tracking along the left abdominal wall, outlining the pericolic gutter.  Vicarious excretion of contrast is present in the gallbladder. Kidneys and adrenals appear within normal limits. There is no bowel obstruction. Fluid collection in the anterior abdominal wall appears slightly smaller compatible with needle aspiration. Foley catheter present urinary bladder. 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  IMPRESSION: 1. Interval placement of pigtail drainage catheter in the subhepatic region inferior to the left hepatic lobe. 2. Small amount of bleeding into the left hepatic lobe mass. New gas within the mass is compatible with Gel-Foam from procedure or earlier today. 3. Small anterior left hepatic lobe subcapsular hematoma with hematocrit level. 4. Moderate hemoperitoneum is new. 5. Probable abscess in the anterior anatomic pelvis superior to the dome of the urinary bladder (image 76 series 2).   Electronically Signed   By: Dereck Ligas M.D.   On: 08/05/2013 17:16   [place] Abscess Drain  08/05/2013   CLINICAL DATA:  11 cm left hepatic mass, subcutaneous soft tissue nodule and perihepatic fluid collection by prior CT.  EXAM: 1. ULTRASOUND-GUIDED CORE BIOPSY OF THE LIVER 2. ULTRASOUND-GUIDED ASPIRATION OF SUBCUTANEOUS FLUID COLLECTION 3. ULTRASOUND-GUIDED PERCUTANEOUS CATHETER DRAINAGE OF PERIHEPATIC ABSCESS  MEDICATIONS: 3.0 mg IV Versed; 100 mcg IV Fentanyl   Total Moderate Sedation Time: 24 minutes.  PROCEDURE: The procedure, risks, benefits, and alternatives were explained to the patient. Questions regarding the procedure were encouraged and answered. The patient understands and consents to  the procedure.  The abdominal wall was prepped with Betadine in a sterile fashion, and a sterile drape was applied covering the operative field. A sterile gown and sterile gloves were used for the procedure. Local anesthesia was provided with 1% Lidocaine.  The liver, subcutaneous tissues and perihepatic region were first examined by ultrasound. An 18 gauge needle was advanced into the midline subcutaneous abdominal wall fluid collection and aspiration performed. Aspirated fluid sample was sent for culture analysis.  Ultrasound-guided biopsy was performed of a hepatic mass in the left lobe of the liver. A 17 gauge needle was advanced into the lesion. A total of 2 separate 18 gauge core biopsy samples were obtained and submitted in formalin. A slurry of sterile saline and Gel-Foam pledgets was injected through the outer needle as it was retracted out of the liver. Ultrasound was performed immediately post biopsy.  An 18 gauge needle was advanced into a left perihepatic/ subhepatic fluid collection. Aspiration was performed. A fluid sample was sent for culture. A guidewire was advanced into the collection. The tract was dilated and a 12 [place] percutaneous drain advanced into the collection. The drain was connected to a suction bulb. It was secured at the skin with a Prolene retention suture and StatLock device.  COMPLICATIONS: None.  FINDINGS: The midline subcutaneous abnormality superficial to the abdominal wall seen by CT corresponds to a complex fluid collection by ultrasound with some surrounding tissue thickening. Aspiration yielded grossly purulent fluid. The cavity was largely decompressed but needle aspiration and knee fluid sample sent for culture analysis.  The nearly 11 cm  heterogeneous mass in the lateral segment of the left lobe of the liver was sampled, revealing solid tissue. There was considerable bleeding from the trocar needle after core biopsy and Gel-Foam pledgets were instilled as the outer needle was removed. No active bleeding was visualized from the surface of the liver by ultrasound immediately after completion of the biopsy.  The complex fluid collection adjacent to the left lobe of the liver and extending into the peritoneal cavity shows visible enlargement by ultrasound compared to a prior ultrasound 5 days ago with maximal dimensions of approximately 10 cm. This also appears to communicate with some additional complex fluid in the midline abdomen. Aspiration yielded bloody and turbid fluid which appeared likely infected. For this reason, a percutaneous drain was placed. A 12 French drain was placed over a wire and attached to a suction bulb. Fluid samples from this collection were sent for culture analysis as well as cytology.  IMPRESSION: 1. Aspiration of midline subcutaneous fluid collection yielded purulent fluid. The small collection was nearly completely decompressed by needle aspiration and a sample of fluid sent for culture analysis. 2. Ultrasound-guided core biopsy was performed of the large left lobe hepatic mass. The percutaneous tract was embolized with Gel-Foam pledgets due to bleeding from the outer needle during the biopsy. There was no immediate evidence of active bleeding from the surface of the liver upon completion of the biopsy. Core samples were sent for histologic analysis. 3. Enlargement of complex fluid collection adjacent to the left lobe of the liver and extending into the peritoneal cavity. Aspiration yielded bloody and turbid fluid which was sent for culture analysis and cytologic analysis. A 12 French percutaneous drain was placed in this collection and attached to a suction bulb. Output will be followed.   Electronically Signed   By:  Irish Lack M.D.   On: 08/05/2013 13:45   US Biopsy  08/05/2013  CLINICAL DATA:  11 cm left hepatic mass, subcutaneous soft tissue nodule and perihepatic fluid collection by prior CT.  EXAM: 1. ULTRASOUND-GUIDED CORE BIOPSY OF THE LIVER 2. ULTRASOUND-GUIDED ASPIRATION OF SUBCUTANEOUS FLUID COLLECTION 3. ULTRASOUND-GUIDED PERCUTANEOUS CATHETER DRAINAGE OF PERIHEPATIC ABSCESS  MEDICATIONS: 3.0 mg IV Versed; 100 mcg IV Fentanyl  Total Moderate Sedation Time: 24 minutes.  PROCEDURE: The procedure, risks, benefits, and alternatives were explained to the patient. Questions regarding the procedure were encouraged and answered. The patient understands and consents to the procedure.  The abdominal wall was prepped with Betadine in a sterile fashion, and a sterile drape was applied covering the operative field. A sterile gown and sterile gloves were used for the procedure. Local anesthesia was provided with 1% Lidocaine.  The liver, subcutaneous tissues and perihepatic region were first examined by ultrasound. An 18 gauge needle was advanced into the midline subcutaneous abdominal wall fluid collection and aspiration performed. Aspirated fluid sample was sent for culture analysis.  Ultrasound-guided biopsy was performed of a hepatic mass in the left lobe of the liver. A 17 gauge needle was advanced into the lesion. A total of 2 separate 18 gauge core biopsy samples were obtained and submitted in formalin. A slurry of sterile saline and Gel-Foam pledgets was injected through the outer needle as it was retracted out of the liver. Ultrasound was performed immediately post biopsy.  An 18 gauge needle was advanced into a left perihepatic/ subhepatic fluid collection. Aspiration was performed. A fluid sample was sent for culture. A guidewire was advanced into the collection. The tract was dilated and a 12 [place] percutaneous drain advanced into the collection. The drain was connected to a suction bulb. It was secured at the  skin with a Prolene retention suture and StatLock device.  COMPLICATIONS: None.  FINDINGS: The midline subcutaneous abnormality superficial to the abdominal wall seen by CT corresponds to a complex fluid collection by ultrasound with some surrounding tissue thickening. Aspiration yielded grossly purulent fluid. The cavity was largely decompressed but needle aspiration and knee fluid sample sent for culture analysis.  The nearly 11 cm heterogeneous mass in the lateral segment of the left lobe of the liver was sampled, revealing solid tissue. There was considerable bleeding from the trocar needle after core biopsy and Gel-Foam pledgets were instilled as the outer needle was removed. No active bleeding was visualized from the surface of the liver by ultrasound immediately after completion of the biopsy.  The complex fluid collection adjacent to the left lobe of the liver and extending into the peritoneal cavity shows visible enlargement by ultrasound compared to a prior ultrasound 5 days ago with maximal dimensions of approximately 10 cm. This also appears to communicate with some additional complex fluid in the midline abdomen. Aspiration yielded bloody and turbid fluid which appeared likely infected. For this reason, a percutaneous drain was placed. A 12 French drain was placed over a wire and attached to a suction bulb. Fluid samples from this collection were sent for culture analysis as well as cytology.  IMPRESSION: 1. Aspiration of midline subcutaneous fluid collection yielded purulent fluid. The small collection was nearly completely decompressed by needle aspiration and a sample of fluid sent for culture analysis. 2. Ultrasound-guided core biopsy was performed of the large left lobe hepatic mass. The percutaneous tract was embolized with Gel-Foam pledgets due to bleeding from the outer needle during the biopsy. There was no immediate evidence of active bleeding from the surface of the liver upon completion of  the  biopsy. Core samples were sent for histologic analysis. 3. Enlargement of complex fluid collection adjacent to the left lobe of the liver and extending into the peritoneal cavity. Aspiration yielded bloody and turbid fluid which was sent for culture analysis and cytologic analysis. A 12 French percutaneous drain was placed in this collection and attached to a suction bulb. Output will be followed.   Electronically Signed   By: Aletta Edouard M.D.   On: 08/05/2013 13:45   US Aspiration  08/05/2013   CLINICAL DATA:  11 cm left hepatic mass, subcutaneous soft tissue nodule and perihepatic fluid collection by prior CT.  EXAM: 1. ULTRASOUND-GUIDED CORE BIOPSY OF THE LIVER 2. ULTRASOUND-GUIDED ASPIRATION OF SUBCUTANEOUS FLUID COLLECTION 3. ULTRASOUND-GUIDED PERCUTANEOUS CATHETER DRAINAGE OF PERIHEPATIC ABSCESS  MEDICATIONS: 3.0 mg IV Versed; 100 mcg IV Fentanyl  Total Moderate Sedation Time: 24 minutes.  PROCEDURE: The procedure, risks, benefits, and alternatives were explained to the patient. Questions regarding the procedure were encouraged and answered. The patient understands and consents to the procedure.  The abdominal wall was prepped with Betadine in a sterile fashion, and a sterile drape was applied covering the operative field. A sterile gown and sterile gloves were used for the procedure. Local anesthesia was provided with 1% Lidocaine.  The liver, subcutaneous tissues and perihepatic region were first examined by ultrasound. An 18 gauge needle was advanced into the midline subcutaneous abdominal wall fluid collection and aspiration performed. Aspirated fluid sample was sent for culture analysis.  Ultrasound-guided biopsy was performed of a hepatic mass in the left lobe of the liver. A 17 gauge needle was advanced into the lesion. A total of 2 separate 18 gauge core biopsy samples were obtained and submitted in formalin. A slurry of sterile saline and Gel-Foam pledgets was injected through the outer  needle as it was retracted out of the liver. Ultrasound was performed immediately post biopsy.  An 18 gauge needle was advanced into a left perihepatic/ subhepatic fluid collection. Aspiration was performed. A fluid sample was sent for culture. A guidewire was advanced into the collection. The tract was dilated and a 12 [place] percutaneous drain advanced into the collection. The drain was connected to a suction bulb. It was secured at the skin with a Prolene retention suture and StatLock device.  COMPLICATIONS: None.  FINDINGS: The midline subcutaneous abnormality superficial to the abdominal wall seen by CT corresponds to a complex fluid collection by ultrasound with some surrounding tissue thickening. Aspiration yielded grossly purulent fluid. The cavity was largely decompressed but needle aspiration and knee fluid sample sent for culture analysis.  The nearly 11 cm heterogeneous mass in the lateral segment of the left lobe of the liver was sampled, revealing solid tissue. There was considerable bleeding from the trocar needle after core biopsy and Gel-Foam pledgets were instilled as the outer needle was removed. No active bleeding was visualized from the surface of the liver by ultrasound immediately after completion of the biopsy.  The complex fluid collection adjacent to the left lobe of the liver and extending into the peritoneal cavity shows visible enlargement by ultrasound compared to a prior ultrasound 5 days ago with maximal dimensions of approximately 10 cm. This also appears to communicate with some additional complex fluid in the midline abdomen. Aspiration yielded bloody and turbid fluid which appeared likely infected. For this reason, a percutaneous drain was placed. A 12 French drain was placed over a wire and attached to a suction bulb. Fluid samples from this collection were sent for  culture analysis as well as cytology.  IMPRESSION: 1. Aspiration of midline subcutaneous fluid collection yielded  purulent fluid. The small collection was nearly completely decompressed by needle aspiration and a sample of fluid sent for culture analysis. 2. Ultrasound-guided core biopsy was performed of the large left lobe hepatic mass. The percutaneous tract was embolized with Gel-Foam pledgets due to bleeding from the outer needle during the biopsy. There was no immediate evidence of active bleeding from the surface of the liver upon completion of the biopsy. Core samples were sent for histologic analysis. 3. Enlargement of complex fluid collection adjacent to the left lobe of the liver and extending into the peritoneal cavity. Aspiration yielded bloody and turbid fluid which was sent for culture analysis and cytologic analysis. A 12 French percutaneous drain was placed in this collection and attached to a suction bulb. Output will be followed.   Electronically Signed   By: Aletta Edouard M.D.   On: 08/05/2013 13:45    ASSESSMENT / PLAN:  GASTROINTESTINAL A:   Sigmoid Fistula with Abscess Diverticulitis with Diverticulosis  Liver mass s/p biopsy 4/6 Hepatic subcapsular hematoma Subq fluid collection in epigastrium Hemoperitoneum    P:   Montitor Continue NPO Trend H/H q12h Follow up on hepatic mass/perihepatic fluid cytology - pending Per surgery  PULMONARY A:  No acute issues. P:   Continue supplemental O2 PRN. IS addition  CARDIOVASCULAR A:  hypotension, improved s/p NS bolus, PRBCs - d/t acute blood loss  h/o HTN P:  No pressors, , MAP wnl Cbc q12h Limit gross overload resuc, would prefer PRBC when needed Holding anti-hypertensives tele  RENAL A:   ATN , hypoperfusion P:   bmet in am  Maintenance fluids redcution  HEMATOLOGIC A:   Acute blood loss - d/t hepatic subcapsular hematoma; hgb 8.3  s/p 2 units PRBCs, 2 units FFP P:  Monitor CBC to q12h VTE px: SCDs Holding anticoagulation, coags corrected  INFECTIOUS A:   Abd SubQ Abscess ?Pelvic abscess corybact cont>  likely  P:   Follow Micro and abx as above Per surgery Follow path  ENDOCRINE A:   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      Lavon Paganini. Titus Mould, MD, [place] Pgr: [facility]

## 2013-08-06 NOTE — Progress Notes (Signed)
Subjective: status post perihepatic abscess drain placement Hepatic mass bx and sub cu epigastric mass aspiration 4/6 Post procedure hypotension; drop in hgb--small subcapsular hematoma Transferred to ICU--stable Ding well today  Objective: Vital signs in last 24 hours: Temp:  [97.5 F (36.4 C)-100 F (37.8 C)] 97.5 F (36.4 C) (04/07 1240) Pulse Rate:  [93-126] 99 (04/07 1300) Resp:  [21-42] 35 (04/07 1300) BP: (70-135)/(29-80) 134/68 mmHg (04/07 1300) SpO2:  [96 %-100 %] 96 % (04/07 1300) Weight:  [140.6 kg (309 lb 15.5 oz)] 140.6 kg (309 lb 15.5 oz) (04/06 1800) Last BM Date: 07/28/13  Intake/Output from previous day: 04/06 0701 - 04/07 0700 In: 6133.2 [I.V.:3901.7; Blood:721.5; IV Piggyback:1500] Out: [id number] [Urine:2060; Drains:580] Intake/Output this shift: Total I/O In: 810 [I.V.:600; Other:10; IV Piggyback:200] Out: 214 [Urine:190; Drains:24]  PE:  Afeb; vss Sitting up in bed Pleasant; good spirits No complaints; no pain Peri hep abscess drain intact; site clean and dry; NT Output 580 cc yesterday- blood tinged with purulence seen 30 cc today; 10 cc in JP   Lab Results:   Recent Labs  08/05/13 1330  08/06/13 0240  08/06/13 0800 08/06/13 1100  WBC 16.0*  --  25.5*  --   --   --   HGB 9.5*  < > 8.0*  < > 8.3* 8.6*  HCT 28.6*  < > 23.8*  < > 24.7* 25.2*  PLT 196  --  176  --   --   --   < > = values in this interval not displayed. BMET  Recent Labs  08/05/13 0500 08/06/13 0240  NA 139 142  K 3.5* 3.7  CL 102 105  CO2 26 24  GLUCOSE 115* 112*  BUN 15 24*  CREATININE 0.81 1.19*  CALCIUM 7.8* 7.2*   PT/INR  Recent Labs  08/05/13 2013 08/06/13 0240  LABPROT 19.6* 18.2*  INR 1.71* 1.55*   ABG No results found for this basename: PHART, PCO2, PO2, HCO3,  in the last 72 hours  Studies/Results: Ct Abdomen Pelvis Wo Contrast  08/05/2013   CLINICAL DATA:  Evaluate intra-abdominal hemorrhage. Angiogram this morning. Known liver mass.  EXAM: CT  ABDOMEN AND PELVIS WITHOUT CONTRAST  TECHNIQUE: Multidetector CT imaging of the abdomen and pelvis was performed following the standard protocol without intravenous contrast.  COMPARISON:  US BIOPSY dated 08/05/2013; CT ABD/PELV WO CM dated 08/01/2013; CT ABD/PELV WO CM dated 07/31/2013  FINDINGS: Small bilateral pleural effusions with bilateral lower lobe atelectasis.  There is a pigtail drainage catheter in the midline of the upper abdomen, extending inferior to the right hepatic lobe. The previously seen sub hepatic fluid collection is being drained by the pigtail drainage catheter and collection is now decompressed. There is gas within the left hepatic lobe mass. Left hepatic lobe mass measures 10 cm AP x 13.8 cm transverse. This is larger than on the prior examination and there is some increased attenuation within the mass, some of which represents Gel-Foam and some of which represents hemorrhage associated with instrumentation earlier today. There is an anterior left hepatic lobe subcapsular hematoma which is difficult to clearly define but measures 3.3 cm in thickness with layering hematocrit level.  In the anatomic pelvis, there is an air-fluid level which appears distinct from the collapsed urinary bladder which has a Foley catheter. This may be within the deep pelvic abscess anterior to the uterus however the pelvis is poorly visualized in the absence of IV contrast. Hemo peritoneum is present tracking along the left abdominal  wall, outlining the pericolic gutter.  Vicarious excretion of contrast is present in the gallbladder. Kidneys and adrenals appear within normal limits. There is no bowel obstruction. Fluid collection in the anterior abdominal wall appears slightly smaller compatible with needle aspiration. Foley catheter present urinary bladder. 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  IMPRESSION: 1. Interval placement of pigtail drainage catheter in the subhepatic region inferior to  the left hepatic lobe. 2. Small amount of bleeding into the left hepatic lobe mass. New gas within the mass is compatible with Gel-Foam from procedure or earlier today. 3. Small anterior left hepatic lobe subcapsular hematoma with hematocrit level. 4. Moderate hemoperitoneum is new. 5. Probable abscess in the anterior anatomic pelvis superior to the dome of the urinary bladder (image 76 series 2).   Electronically Signed   By: Dereck Ligas M.D.   On: 08/05/2013 17:16   [place] Abscess Drain  08/05/2013   CLINICAL DATA:  11 cm left hepatic mass, subcutaneous soft tissue nodule and perihepatic fluid collection by prior CT.  EXAM: 1. ULTRASOUND-GUIDED CORE BIOPSY OF THE LIVER 2. ULTRASOUND-GUIDED ASPIRATION OF SUBCUTANEOUS FLUID COLLECTION 3. ULTRASOUND-GUIDED PERCUTANEOUS CATHETER DRAINAGE OF PERIHEPATIC ABSCESS  MEDICATIONS: 3.0 mg IV Versed; 100 mcg IV Fentanyl  Total Moderate Sedation Time: 24 minutes.  PROCEDURE: The procedure, risks, benefits, and alternatives were explained to the patient. Questions regarding the procedure were encouraged and answered. The patient understands and consents to the procedure.  The abdominal wall was prepped with Betadine in a sterile fashion, and a sterile drape was applied covering the operative field. A sterile gown and sterile gloves were used for the procedure. Local anesthesia was provided with 1% Lidocaine.  The liver, subcutaneous tissues and perihepatic region were first examined by ultrasound. An 18 gauge needle was advanced into the midline subcutaneous abdominal wall fluid collection and aspiration performed. Aspirated fluid sample was sent for culture analysis.  Ultrasound-guided biopsy was performed of a hepatic mass in the left lobe of the liver. A 17 gauge needle was advanced into the lesion. A total of 2 separate 18 gauge core biopsy samples were obtained and submitted in formalin. A slurry of sterile saline and Gel-Foam pledgets was injected through the outer  needle as it was retracted out of the liver. Ultrasound was performed immediately post biopsy.  An 18 gauge needle was advanced into a left perihepatic/ subhepatic fluid collection. Aspiration was performed. A fluid sample was sent for culture. A guidewire was advanced into the collection. The tract was dilated and a 12 [place] percutaneous drain advanced into the collection. The drain was connected to a suction bulb. It was secured at the skin with a Prolene retention suture and StatLock device.  COMPLICATIONS: None.  FINDINGS: The midline subcutaneous abnormality superficial to the abdominal wall seen by CT corresponds to a complex fluid collection by ultrasound with some surrounding tissue thickening. Aspiration yielded grossly purulent fluid. The cavity was largely decompressed but needle aspiration and knee fluid sample sent for culture analysis.  The nearly 11 cm heterogeneous mass in the lateral segment of the left lobe of the liver was sampled, revealing solid tissue. There was considerable bleeding from the trocar needle after core biopsy and Gel-Foam pledgets were instilled as the outer needle was removed. No active bleeding was visualized from the surface of the liver by ultrasound immediately after completion of the biopsy.  The complex fluid collection adjacent to the left lobe of the liver and extending into the peritoneal cavity shows visible enlargement  by ultrasound compared to a prior ultrasound 5 days ago with maximal dimensions of approximately 10 cm. This also appears to communicate with some additional complex fluid in the midline abdomen. Aspiration yielded bloody and turbid fluid which appeared likely infected. For this reason, a percutaneous drain was placed. A 12 French drain was placed over a wire and attached to a suction bulb. Fluid samples from this collection were sent for culture analysis as well as cytology.  IMPRESSION: 1. Aspiration of midline subcutaneous fluid collection yielded  purulent fluid. The small collection was nearly completely decompressed by needle aspiration and a sample of fluid sent for culture analysis. 2. Ultrasound-guided core biopsy was performed of the large left lobe hepatic mass. The percutaneous tract was embolized with Gel-Foam pledgets due to bleeding from the outer needle during the biopsy. There was no immediate evidence of active bleeding from the surface of the liver upon completion of the biopsy. Core samples were sent for histologic analysis. 3. Enlargement of complex fluid collection adjacent to the left lobe of the liver and extending into the peritoneal cavity. Aspiration yielded bloody and turbid fluid which was sent for culture analysis and cytologic analysis. A 12 French percutaneous drain was placed in this collection and attached to a suction bulb. Output will be followed.   Electronically Signed   By: Aletta Edouard M.D.   On: 08/05/2013 13:45   US Biopsy  08/05/2013   CLINICAL DATA:  11 cm left hepatic mass, subcutaneous soft tissue nodule and perihepatic fluid collection by prior CT.  EXAM: 1. ULTRASOUND-GUIDED CORE BIOPSY OF THE LIVER 2. ULTRASOUND-GUIDED ASPIRATION OF SUBCUTANEOUS FLUID COLLECTION 3. ULTRASOUND-GUIDED PERCUTANEOUS CATHETER DRAINAGE OF PERIHEPATIC ABSCESS  MEDICATIONS: 3.0 mg IV Versed; 100 mcg IV Fentanyl  Total Moderate Sedation Time: 24 minutes.  PROCEDURE: The procedure, risks, benefits, and alternatives were explained to the patient. Questions regarding the procedure were encouraged and answered. The patient understands and consents to the procedure.  The abdominal wall was prepped with Betadine in a sterile fashion, and a sterile drape was applied covering the operative field. A sterile gown and sterile gloves were used for the procedure. Local anesthesia was provided with 1% Lidocaine.  The liver, subcutaneous tissues and perihepatic region were first examined by ultrasound. An 18 gauge needle was advanced into the midline  subcutaneous abdominal wall fluid collection and aspiration performed. Aspirated fluid sample was sent for culture analysis.  Ultrasound-guided biopsy was performed of a hepatic mass in the left lobe of the liver. A 17 gauge needle was advanced into the lesion. A total of 2 separate 18 gauge core biopsy samples were obtained and submitted in formalin. A slurry of sterile saline and Gel-Foam pledgets was injected through the outer needle as it was retracted out of the liver. Ultrasound was performed immediately post biopsy.  An 18 gauge needle was advanced into a left perihepatic/ subhepatic fluid collection. Aspiration was performed. A fluid sample was sent for culture. A guidewire was advanced into the collection. The tract was dilated and a 12 [place] percutaneous drain advanced into the collection. The drain was connected to a suction bulb. It was secured at the skin with a Prolene retention suture and StatLock device.  COMPLICATIONS: None.  FINDINGS: The midline subcutaneous abnormality superficial to the abdominal wall seen by CT corresponds to a complex fluid collection by ultrasound with some surrounding tissue thickening. Aspiration yielded grossly purulent fluid. The cavity was largely decompressed but needle aspiration and knee fluid sample sent for culture  analysis.  The nearly 11 cm heterogeneous mass in the lateral segment of the left lobe of the liver was sampled, revealing solid tissue. There was considerable bleeding from the trocar needle after core biopsy and Gel-Foam pledgets were instilled as the outer needle was removed. No active bleeding was visualized from the surface of the liver by ultrasound immediately after completion of the biopsy.  The complex fluid collection adjacent to the left lobe of the liver and extending into the peritoneal cavity shows visible enlargement by ultrasound compared to a prior ultrasound 5 days ago with maximal dimensions of approximately 10 cm. This also appears to  communicate with some additional complex fluid in the midline abdomen. Aspiration yielded bloody and turbid fluid which appeared likely infected. For this reason, a percutaneous drain was placed. A 12 French drain was placed over a wire and attached to a suction bulb. Fluid samples from this collection were sent for culture analysis as well as cytology.  IMPRESSION: 1. Aspiration of midline subcutaneous fluid collection yielded purulent fluid. The small collection was nearly completely decompressed by needle aspiration and a sample of fluid sent for culture analysis. 2. Ultrasound-guided core biopsy was performed of the large left lobe hepatic mass. The percutaneous tract was embolized with Gel-Foam pledgets due to bleeding from the outer needle during the biopsy. There was no immediate evidence of active bleeding from the surface of the liver upon completion of the biopsy. Core samples were sent for histologic analysis. 3. Enlargement of complex fluid collection adjacent to the left lobe of the liver and extending into the peritoneal cavity. Aspiration yielded bloody and turbid fluid which was sent for culture analysis and cytologic analysis. A 12 French percutaneous drain was placed in this collection and attached to a suction bulb. Output will be followed.   Electronically Signed   By: Aletta Edouard M.D.   On: 08/05/2013 13:45   US Aspiration  08/05/2013   CLINICAL DATA:  11 cm left hepatic mass, subcutaneous soft tissue nodule and perihepatic fluid collection by prior CT.  EXAM: 1. ULTRASOUND-GUIDED CORE BIOPSY OF THE LIVER 2. ULTRASOUND-GUIDED ASPIRATION OF SUBCUTANEOUS FLUID COLLECTION 3. ULTRASOUND-GUIDED PERCUTANEOUS CATHETER DRAINAGE OF PERIHEPATIC ABSCESS  MEDICATIONS: 3.0 mg IV Versed; 100 mcg IV Fentanyl  Total Moderate Sedation Time: 24 minutes.  PROCEDURE: The procedure, risks, benefits, and alternatives were explained to the patient. Questions regarding the procedure were encouraged and answered.  The patient understands and consents to the procedure.  The abdominal wall was prepped with Betadine in a sterile fashion, and a sterile drape was applied covering the operative field. A sterile gown and sterile gloves were used for the procedure. Local anesthesia was provided with 1% Lidocaine.  The liver, subcutaneous tissues and perihepatic region were first examined by ultrasound. An 18 gauge needle was advanced into the midline subcutaneous abdominal wall fluid collection and aspiration performed. Aspirated fluid sample was sent for culture analysis.  Ultrasound-guided biopsy was performed of a hepatic mass in the left lobe of the liver. A 17 gauge needle was advanced into the lesion. A total of 2 separate 18 gauge core biopsy samples were obtained and submitted in formalin. A slurry of sterile saline and Gel-Foam pledgets was injected through the outer needle as it was retracted out of the liver. Ultrasound was performed immediately post biopsy.  An 18 gauge needle was advanced into a left perihepatic/ subhepatic fluid collection. Aspiration was performed. A fluid sample was sent for culture. A guidewire was advanced into the  collection. The tract was dilated and a 12 [place] percutaneous drain advanced into the collection. The drain was connected to a suction bulb. It was secured at the skin with a Prolene retention suture and StatLock device.  COMPLICATIONS: None.  FINDINGS: The midline subcutaneous abnormality superficial to the abdominal wall seen by CT corresponds to a complex fluid collection by ultrasound with some surrounding tissue thickening. Aspiration yielded grossly purulent fluid. The cavity was largely decompressed but needle aspiration and knee fluid sample sent for culture analysis.  The nearly 11 cm heterogeneous mass in the lateral segment of the left lobe of the liver was sampled, revealing solid tissue. There was considerable bleeding from the trocar needle after core biopsy and Gel-Foam  pledgets were instilled as the outer needle was removed. No active bleeding was visualized from the surface of the liver by ultrasound immediately after completion of the biopsy.  The complex fluid collection adjacent to the left lobe of the liver and extending into the peritoneal cavity shows visible enlargement by ultrasound compared to a prior ultrasound 5 days ago with maximal dimensions of approximately 10 cm. This also appears to communicate with some additional complex fluid in the midline abdomen. Aspiration yielded bloody and turbid fluid which appeared likely infected. For this reason, a percutaneous drain was placed. A 12 French drain was placed over a wire and attached to a suction bulb. Fluid samples from this collection were sent for culture analysis as well as cytology.  IMPRESSION: 1. Aspiration of midline subcutaneous fluid collection yielded purulent fluid. The small collection was nearly completely decompressed by needle aspiration and a sample of fluid sent for culture analysis. 2. Ultrasound-guided core biopsy was performed of the large left lobe hepatic mass. The percutaneous tract was embolized with Gel-Foam pledgets due to bleeding from the outer needle during the biopsy. There was no immediate evidence of active bleeding from the surface of the liver upon completion of the biopsy. Core samples were sent for histologic analysis. 3. Enlargement of complex fluid collection adjacent to the left lobe of the liver and extending into the peritoneal cavity. Aspiration yielded bloody and turbid fluid which was sent for culture analysis and cytologic analysis. A 12 French percutaneous drain was placed in this collection and attached to a suction bulb. Output will be followed.   Electronically Signed   By: Aletta Edouard M.D.   On: 08/05/2013 13:45    Anti-infectives: Anti-infectives   Start     Dose/Rate Route Frequency Ordered Stop   08/04/13 0500  vancomycin (VANCOCIN) 1,250 mg in sodium  chloride 0.9 % 250 mL IVPB     1,250 mg 166.7 mL/hr over 90 Minutes Intravenous Every 12 hours 08/03/13 1507     08/02/13 1200  levofloxacin (LEVAQUIN) IVPB 500 mg  Status:  Discontinued     500 mg 100 mL/hr over 60 Minutes Intravenous Every 48 hours 07/31/13 1812 07/31/13 2002   08/02/13 1000  ciprofloxacin (CIPRO) IVPB 400 mg     400 mg 200 mL/hr over 60 Minutes Intravenous Every 12 hours 08/02/13 0911     08/02/13 0400  vancomycin (VANCOCIN) 1,750 mg in sodium chloride 0.9 % 500 mL IVPB  Status:  Discontinued     1,750 mg 250 mL/hr over 120 Minutes Intravenous Every 24 hours 08/02/13 0357 08/03/13 1507   08/01/13 1000  ciprofloxacin (CIPRO) IVPB 400 mg  Status:  Discontinued     400 mg 200 mL/hr over 60 Minutes Intravenous Every 24 hours 08/01/13 0959 08/02/13  8756   07/31/13 2015  vancomycin (VANCOCIN) 2,500 mg in sodium chloride 0.9 % 500 mL IVPB     2,500 mg 250 mL/hr over 120 Minutes Intravenous  Once 07/31/13 2000 07/31/13 2346   07/31/13 2000  metroNIDAZOLE (FLAGYL) IVPB 500 mg     500 mg 100 mL/hr over 60 Minutes Intravenous Every 8 hours 07/31/13 1812     07/31/13 1830  aztreonam (AZACTAM) 1 g in dextrose 5 % 50 mL IVPB  Status:  Discontinued     1 g 100 mL/hr over 30 Minutes Intravenous Every 8 hours 07/31/13 1815 08/01/13 0958   07/31/13 1130  levofloxacin (LEVAQUIN) IVPB 750 mg     750 mg 100 mL/hr over 90 Minutes Intravenous  Once 07/31/13 1123 07/31/13 1310   07/31/13 1130  metroNIDAZOLE (FLAGYL) IVPB 500 mg     500 mg 100 mL/hr over 60 Minutes Intravenous  Once 07/31/13 1123 07/31/13 1310      Assessment/Plan: s/p * No surgery found *  Perihep abscess drain intact Pt doing well today Much improved - thank you to CCM  Will follow   LOS: 6 days    Holten Spano A 08/06/2013

## 2013-08-06 NOTE — Progress Notes (Signed)
Clear liquids are okay.  BP stable.  Hemoglobin stable.  Awaiting biopsy results.  [name]. Dahlia Bailiff, MD, [place] [phone] [facility]

## 2013-08-06 NOTE — Progress Notes (Signed)
Patient with elevated heart rate 170's - 190's.   Twelve lead EKG obtained per Dr Wyvonna Plum.  Patient developed this rhythm while going from bed to wheelchair for transport to [facility] cancelled, and patient to remain in ICU per Dr Wyvonna Plum.  Pt with no c/o chest pain, dizziness, or headache.  Rate reduced to 105 after patient placed back in bed.  BP stable throughout. Will continue to monitor.

## 2013-08-06 NOTE — Progress Notes (Signed)
Subjective: She seems comfortable, want to know if she can eat.  Some drainage from JP, it's clear with what looks like clotted blood in it, she says they just flushed it so I don't know what is her and what is flush.  Objective: Vital signs in last 24 hours: Temp:  [97.6 F (36.4 C)-100 F (37.8 C)] 98.7 F (37.1 C) (04/07 0836) Pulse Rate:  [87-126] 98 (04/07 0900) Resp:  [12-42] 21 (04/07 0900) BP: (58-168)/(29-104) 100/77 mmHg (04/07 0900) SpO2:  [96 %-100 %] 98 % (04/07 0900) Weight:  [140.6 kg (309 lb 15.5 oz)] 140.6 kg (309 lb 15.5 oz) (04/06 1800) Last BM Date: 07/28/13 721 in blood products given after IR procedure yesterday IR Findings: Left lobe liver mass 18 G core bx x 2 via 17 G needle. Tract embolized with Gelfoam pledgets. Very vascular mass: ? Giant atypical hemangioma by [place].  Subcutaneous abnormality of midline abdominal wall yielded purulent appearing fluid: 5 mL aspirated and sent for culture.  Large, complex and loculated perihepatic fluid collection aspirated, yielding bloody, turbid fluid. Given nature of fluid, 12 Fr drain placed over wire and attached to suction bulb. ?infected hematoma vs abscess. Fluid samples from this collection sent for culture and cytology.  Afebrile, TM 99, RR in the 30 range thru the night, down to 21 at 0800, low BP 98/56 H/h:  10.9/23.3 down to 7.7/23 .  Up to 8.8/24.7 0700 BP 100/77 580 from the drain. Intake/Output from previous day: 04/06 0701 - 04/07 0700 In: 6133.2 [I.V.:3901.7; Blood:721.5; IV Piggyback:1500] Out: 5732 [Urine:2060; Drains:580] Intake/Output this shift: Total I/O In: -  Out: 65 [Urine:65]  General appearance: alert, cooperative and no distress GI: soft sore, some drainage as descibed above.  No real abdominal tenderness, distension or pain.  Lab Results:   Recent Labs  08/05/13 1330  08/06/13 0240 08/06/13 0430 08/06/13 0800  WBC 16.0*  --  25.5*  --   --   HGB 9.5*  < > 8.0* 7.7* 8.3*  HCT  28.6*  < > 23.8* 23.3* 24.7*  PLT 196  --  176  --   --   < > = values in this interval not displayed.  BMET  Recent Labs  08/05/13 0500 08/06/13 0240  NA 139 142  K 3.5* 3.7  CL 102 105  CO2 26 24  GLUCOSE 115* 112*  BUN 15 24*  CREATININE 0.81 1.19*  CALCIUM 7.8* 7.2*   PT/INR  Recent Labs  08/05/13 2013 08/06/13 0240  LABPROT 19.6* 18.2*  INR 1.71* 1.55*     Recent Labs Lab 07/31/13 2320 08/02/13 0300 08/04/13 0500 08/05/13 0500 08/06/13 0240  AST 83* 87* 23 17 84*  ALT 129* 120* 53* 35 48*  ALKPHOS 85 66 81 91 78  BILITOT 1.1 0.9 0.7 0.8 1.0  PROT 6.7 6.3 6.1 6.0 5.4*  ALBUMIN 2.4* 2.0* 1.9* 1.9* 1.8*     Lipase     Component Value Date/Time   LIPASE 27.0 02/08/2013 1345     Studies/Results: Ct Abdomen Pelvis Wo Contrast  08/05/2013   CLINICAL DATA:  Evaluate intra-abdominal hemorrhage. Angiogram this morning. Known liver mass.  EXAM: CT ABDOMEN AND PELVIS WITHOUT CONTRAST  TECHNIQUE: Multidetector CT imaging of the abdomen and pelvis was performed following the standard protocol without intravenous contrast.  COMPARISON:  US BIOPSY dated 08/05/2013; CT ABD/PELV WO CM dated 08/01/2013; CT ABD/PELV WO CM dated 07/31/2013  FINDINGS: Small bilateral pleural effusions with bilateral lower lobe atelectasis.  There  is a pigtail drainage catheter in the midline of the upper abdomen, extending inferior to the right hepatic lobe. The previously seen sub hepatic fluid collection is being drained by the pigtail drainage catheter and collection is now decompressed. There is gas within the left hepatic lobe mass. Left hepatic lobe mass measures 10 cm AP x 13.8 cm transverse. This is larger than on the prior examination and there is some increased attenuation within the mass, some of which represents Gel-Foam and some of which represents hemorrhage associated with instrumentation earlier today. There is an anterior left hepatic lobe subcapsular hematoma which is difficult to  clearly define but measures 3.3 cm in thickness with layering hematocrit level.  In the anatomic pelvis, there is an air-fluid level which appears distinct from the collapsed urinary bladder which has a Foley catheter. This may be within the deep pelvic abscess anterior to the uterus however the pelvis is poorly visualized in the absence of IV contrast. Hemo peritoneum is present tracking along the left abdominal wall, outlining the pericolic gutter.  Vicarious excretion of contrast is present in the gallbladder. Kidneys and adrenals appear within normal limits. There is no bowel obstruction. Fluid collection in the anterior abdominal wall appears slightly smaller compatible with needle aspiration. Foley catheter present urinary bladder. 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  IMPRESSION: 1. Interval placement of pigtail drainage catheter in the subhepatic region inferior to the left hepatic lobe. 2. Small amount of bleeding into the left hepatic lobe mass. New gas within the mass is compatible with Gel-Foam from procedure or earlier today. 3. Small anterior left hepatic lobe subcapsular hematoma with hematocrit level. 4. Moderate hemoperitoneum is new. 5. Probable abscess in the anterior anatomic pelvis superior to the dome of the urinary bladder (image 76 series 2).   Electronically Signed   By: Dereck Ligas M.D.   On: 08/05/2013 17:16   [place] Abscess Drain  08/05/2013   CLINICAL DATA:  11 cm left hepatic mass, subcutaneous soft tissue nodule and perihepatic fluid collection by prior CT.  EXAM: 1. ULTRASOUND-GUIDED CORE BIOPSY OF THE LIVER 2. ULTRASOUND-GUIDED ASPIRATION OF SUBCUTANEOUS FLUID COLLECTION 3. ULTRASOUND-GUIDED PERCUTANEOUS CATHETER DRAINAGE OF PERIHEPATIC ABSCESS  MEDICATIONS: 3.0 mg IV Versed; 100 mcg IV Fentanyl  Total Moderate Sedation Time: 24 minutes.  PROCEDURE: The procedure, risks, benefits, and alternatives were explained to the patient. Questions regarding the  procedure were encouraged and answered. The patient understands and consents to the procedure.  The abdominal wall was prepped with Betadine in a sterile fashion, and a sterile drape was applied covering the operative field. A sterile gown and sterile gloves were used for the procedure. Local anesthesia was provided with 1% Lidocaine.  The liver, subcutaneous tissues and perihepatic region were first examined by ultrasound. An 18 gauge needle was advanced into the midline subcutaneous abdominal wall fluid collection and aspiration performed. Aspirated fluid sample was sent for culture analysis.  Ultrasound-guided biopsy was performed of a hepatic mass in the left lobe of the liver. A 17 gauge needle was advanced into the lesion. A total of 2 separate 18 gauge core biopsy samples were obtained and submitted in formalin. A slurry of sterile saline and Gel-Foam pledgets was injected through the outer needle as it was retracted out of the liver. Ultrasound was performed immediately post biopsy.  An 18 gauge needle was advanced into a left perihepatic/ subhepatic fluid collection. Aspiration was performed. A fluid sample was sent for culture. A guidewire was advanced into the  collection. The tract was dilated and a 12 [place] percutaneous drain advanced into the collection. The drain was connected to a suction bulb. It was secured at the skin with a Prolene retention suture and StatLock device.  COMPLICATIONS: None.  FINDINGS: The midline subcutaneous abnormality superficial to the abdominal wall seen by CT corresponds to a complex fluid collection by ultrasound with some surrounding tissue thickening. Aspiration yielded grossly purulent fluid. The cavity was largely decompressed but needle aspiration and knee fluid sample sent for culture analysis.  The nearly 11 cm heterogeneous mass in the lateral segment of the left lobe of the liver was sampled, revealing solid tissue. There was considerable bleeding from the trocar  needle after core biopsy and Gel-Foam pledgets were instilled as the outer needle was removed. No active bleeding was visualized from the surface of the liver by ultrasound immediately after completion of the biopsy.  The complex fluid collection adjacent to the left lobe of the liver and extending into the peritoneal cavity shows visible enlargement by ultrasound compared to a prior ultrasound 5 days ago with maximal dimensions of approximately 10 cm. This also appears to communicate with some additional complex fluid in the midline abdomen. Aspiration yielded bloody and turbid fluid which appeared likely infected. For this reason, a percutaneous drain was placed. A 12 French drain was placed over a wire and attached to a suction bulb. Fluid samples from this collection were sent for culture analysis as well as cytology.  IMPRESSION: 1. Aspiration of midline subcutaneous fluid collection yielded purulent fluid. The small collection was nearly completely decompressed by needle aspiration and a sample of fluid sent for culture analysis. 2. Ultrasound-guided core biopsy was performed of the large left lobe hepatic mass. The percutaneous tract was embolized with Gel-Foam pledgets due to bleeding from the outer needle during the biopsy. There was no immediate evidence of active bleeding from the surface of the liver upon completion of the biopsy. Core samples were sent for histologic analysis. 3. Enlargement of complex fluid collection adjacent to the left lobe of the liver and extending into the peritoneal cavity. Aspiration yielded bloody and turbid fluid which was sent for culture analysis and cytologic analysis. A 12 French percutaneous drain was placed in this collection and attached to a suction bulb. Output will be followed.   Electronically Signed   By: Irish Lack M.D.   On: 08/05/2013 13:45   US Biopsy  08/05/2013   CLINICAL DATA:  11 cm left hepatic mass, subcutaneous soft tissue nodule and perihepatic  fluid collection by prior CT.  EXAM: 1. ULTRASOUND-GUIDED CORE BIOPSY OF THE LIVER 2. ULTRASOUND-GUIDED ASPIRATION OF SUBCUTANEOUS FLUID COLLECTION 3. ULTRASOUND-GUIDED PERCUTANEOUS CATHETER DRAINAGE OF PERIHEPATIC ABSCESS  MEDICATIONS: 3.0 mg IV Versed; 100 mcg IV Fentanyl  Total Moderate Sedation Time: 24 minutes.  PROCEDURE: The procedure, risks, benefits, and alternatives were explained to the patient. Questions regarding the procedure were encouraged and answered. The patient understands and consents to the procedure.  The abdominal wall was prepped with Betadine in a sterile fashion, and a sterile drape was applied covering the operative field. A sterile gown and sterile gloves were used for the procedure. Local anesthesia was provided with 1% Lidocaine.  The liver, subcutaneous tissues and perihepatic region were first examined by ultrasound. An 18 gauge needle was advanced into the midline subcutaneous abdominal wall fluid collection and aspiration performed. Aspirated fluid sample was sent for culture analysis.  Ultrasound-guided biopsy was performed of a hepatic mass in the left  lobe of the liver. A 17 gauge needle was advanced into the lesion. A total of 2 separate 18 gauge core biopsy samples were obtained and submitted in formalin. A slurry of sterile saline and Gel-Foam pledgets was injected through the outer needle as it was retracted out of the liver. Ultrasound was performed immediately post biopsy.  An 18 gauge needle was advanced into a left perihepatic/ subhepatic fluid collection. Aspiration was performed. A fluid sample was sent for culture. A guidewire was advanced into the collection. The tract was dilated and a 12 [place] percutaneous drain advanced into the collection. The drain was connected to a suction bulb. It was secured at the skin with a Prolene retention suture and StatLock device.  COMPLICATIONS: None.  FINDINGS: The midline subcutaneous abnormality superficial to the abdominal wall  seen by CT corresponds to a complex fluid collection by ultrasound with some surrounding tissue thickening. Aspiration yielded grossly purulent fluid. The cavity was largely decompressed but needle aspiration and knee fluid sample sent for culture analysis.  The nearly 11 cm heterogeneous mass in the lateral segment of the left lobe of the liver was sampled, revealing solid tissue. There was considerable bleeding from the trocar needle after core biopsy and Gel-Foam pledgets were instilled as the outer needle was removed. No active bleeding was visualized from the surface of the liver by ultrasound immediately after completion of the biopsy.  The complex fluid collection adjacent to the left lobe of the liver and extending into the peritoneal cavity shows visible enlargement by ultrasound compared to a prior ultrasound 5 days ago with maximal dimensions of approximately 10 cm. This also appears to communicate with some additional complex fluid in the midline abdomen. Aspiration yielded bloody and turbid fluid which appeared likely infected. For this reason, a percutaneous drain was placed. A 12 French drain was placed over a wire and attached to a suction bulb. Fluid samples from this collection were sent for culture analysis as well as cytology.  IMPRESSION: 1. Aspiration of midline subcutaneous fluid collection yielded purulent fluid. The small collection was nearly completely decompressed by needle aspiration and a sample of fluid sent for culture analysis. 2. Ultrasound-guided core biopsy was performed of the large left lobe hepatic mass. The percutaneous tract was embolized with Gel-Foam pledgets due to bleeding from the outer needle during the biopsy. There was no immediate evidence of active bleeding from the surface of the liver upon completion of the biopsy. Core samples were sent for histologic analysis. 3. Enlargement of complex fluid collection adjacent to the left lobe of the liver and extending into  the peritoneal cavity. Aspiration yielded bloody and turbid fluid which was sent for culture analysis and cytologic analysis. A 12 French percutaneous drain was placed in this collection and attached to a suction bulb. Output will be followed.   Electronically Signed   By: Aletta Edouard M.D.   On: 08/05/2013 13:45   US Aspiration  08/05/2013   CLINICAL DATA:  11 cm left hepatic mass, subcutaneous soft tissue nodule and perihepatic fluid collection by prior CT.  EXAM: 1. ULTRASOUND-GUIDED CORE BIOPSY OF THE LIVER 2. ULTRASOUND-GUIDED ASPIRATION OF SUBCUTANEOUS FLUID COLLECTION 3. ULTRASOUND-GUIDED PERCUTANEOUS CATHETER DRAINAGE OF PERIHEPATIC ABSCESS  MEDICATIONS: 3.0 mg IV Versed; 100 mcg IV Fentanyl  Total Moderate Sedation Time: 24 minutes.  PROCEDURE: The procedure, risks, benefits, and alternatives were explained to the patient. Questions regarding the procedure were encouraged and answered. The patient understands and consents to the procedure.  The abdominal  wall was prepped with Betadine in a sterile fashion, and a sterile drape was applied covering the operative field. A sterile gown and sterile gloves were used for the procedure. Local anesthesia was provided with 1% Lidocaine.  The liver, subcutaneous tissues and perihepatic region were first examined by ultrasound. An 18 gauge needle was advanced into the midline subcutaneous abdominal wall fluid collection and aspiration performed. Aspirated fluid sample was sent for culture analysis.  Ultrasound-guided biopsy was performed of a hepatic mass in the left lobe of the liver. A 17 gauge needle was advanced into the lesion. A total of 2 separate 18 gauge core biopsy samples were obtained and submitted in formalin. A slurry of sterile saline and Gel-Foam pledgets was injected through the outer needle as it was retracted out of the liver. Ultrasound was performed immediately post biopsy.  An 18 gauge needle was advanced into a left perihepatic/ subhepatic  fluid collection. Aspiration was performed. A fluid sample was sent for culture. A guidewire was advanced into the collection. The tract was dilated and a 12 [place] percutaneous drain advanced into the collection. The drain was connected to a suction bulb. It was secured at the skin with a Prolene retention suture and StatLock device.  COMPLICATIONS: None.  FINDINGS: The midline subcutaneous abnormality superficial to the abdominal wall seen by CT corresponds to a complex fluid collection by ultrasound with some surrounding tissue thickening. Aspiration yielded grossly purulent fluid. The cavity was largely decompressed but needle aspiration and knee fluid sample sent for culture analysis.  The nearly 11 cm heterogeneous mass in the lateral segment of the left lobe of the liver was sampled, revealing solid tissue. There was considerable bleeding from the trocar needle after core biopsy and Gel-Foam pledgets were instilled as the outer needle was removed. No active bleeding was visualized from the surface of the liver by ultrasound immediately after completion of the biopsy.  The complex fluid collection adjacent to the left lobe of the liver and extending into the peritoneal cavity shows visible enlargement by ultrasound compared to a prior ultrasound 5 days ago with maximal dimensions of approximately 10 cm. This also appears to communicate with some additional complex fluid in the midline abdomen. Aspiration yielded bloody and turbid fluid which appeared likely infected. For this reason, a percutaneous drain was placed. A 12 French drain was placed over a wire and attached to a suction bulb. Fluid samples from this collection were sent for culture analysis as well as cytology.  IMPRESSION: 1. Aspiration of midline subcutaneous fluid collection yielded purulent fluid. The small collection was nearly completely decompressed by needle aspiration and a sample of fluid sent for culture analysis. 2. Ultrasound-guided  core biopsy was performed of the large left lobe hepatic mass. The percutaneous tract was embolized with Gel-Foam pledgets due to bleeding from the outer needle during the biopsy. There was no immediate evidence of active bleeding from the surface of the liver upon completion of the biopsy. Core samples were sent for histologic analysis. 3. Enlargement of complex fluid collection adjacent to the left lobe of the liver and extending into the peritoneal cavity. Aspiration yielded bloody and turbid fluid which was sent for culture analysis and cytologic analysis. A 12 French percutaneous drain was placed in this collection and attached to a suction bulb. Output will be followed.   Electronically Signed   By: Aletta Edouard M.D.   On: 08/05/2013 13:45    Medications: . ciprofloxacin  400 mg Intravenous Q12H  .  lidocaine  15 mL Mouth/Throat Once  . metronidazole  500 mg Intravenous Q8H  . vancomycin  1,250 mg Intravenous Q12H    Assessment/Plan Diverticulitis, continue ABX, allow clears  Liver mass and sub cut abdominal mass - AFP P,  S/p IR:  ) Core biopsy of left lobe liver mass, 2) Aspiration of subcutaneous fluid collection, 3) Percutaneous drainage of perihepatic fluid. All under ultrasound guidance. 12 Fr drain placed over wire and attached to suction bulb. ?infected hematoma vs abscess.  Plan:  I will check with Dr. Hulen Skains, I think she can have clears if OK with CCM.   Continue CIPRO, FLAGYL AND VANCOMYCIN, SHE IS STARTING DAY 6.  Wbc STILL elevated.  No heparin, SCD for DVT.  LOS: 6 days    Deshawnda Acrey 08/06/2013

## 2013-08-06 NOTE — Progress Notes (Addendum)
ANTIBIOTIC CONSULT NOTE - Follow up  Pharmacy Consult for Vancomycin  Indication: Intra-abdominal infection (Peridiverticular abscess + liver lesion)  Allergies  Allergen Reactions  . Lisinopril     REACTION: lethargy, cough.  . Penicillins     REACTION: LYMPH NODES SWELL   Patient Measurements: Height: 5\' 1"  (154.9 cm) Weight: 309 lb 15.5 oz (140.6 kg) IBW/kg (Calculated) : 47.8  Vital Signs: Temp: 97.5 F (36.4 C) (04/07 1240) Temp src: Oral (04/07 1240) BP: 125/72 mmHg (04/07 1100) Pulse Rate: 106 (04/07 1100) Intake/Output from previous day: 04/06 0701 - 04/07 0700 In: 6133.2 [I.V.:3901.7; Blood:721.5; IV Piggyback:1500] Out: [id number] [Urine:2060; Drains:580] Intake/Output from this shift: Total I/O In: 810 [I.V.:600; Other:10; IV Piggyback:200] Out: 214 [Urine:190; Drains:24]  Labs:  Recent Labs  08/04/13 0500  08/05/13 0500 08/05/13 1330  08/06/13 0240 08/06/13 0430 08/06/13 0800 08/06/13 1100  WBC  --   --  16.3* 16.0*  --  25.5*  --   --   --   HGB  --   < > 10.9* 9.5*  < > 8.0* 7.7* 8.3* 8.6*  PLT  --   --  158 196  --  176  --   --   --   CREATININE 0.99  --  0.81  --   --  1.19*  --   --   --   < > = values in this interval not displayed. Estimated Creatinine Clearance: 71.6 ml/min (by C-G formula based on Cr of 1.19). No results found for this basename: VANCOTROUGH, VANCOPEAK, VANCORANDOM, GENTTROUGH, GENTPEAK, GENTRANDOM, TOBRATROUGH, TOBRAPEAK, TOBRARND, AMIKACINPEAK, AMIKACINTROU, AMIKACIN,  in the last 72 hours   Microbiology: Recent Results (from the past 720 hour(s))  CULTURE, BLOOD (ROUTINE X 2)     Status: None   Collection Time    07/31/13 11:20 AM      Result Value Ref Range Status   Specimen Description BLOOD RIGHT ANTECUBITAL   Final   Special Requests BOTTLES DRAWN AEROBIC AND ANAEROBIC 5CC   Final   Culture  Setup Time     Final   Value: 07/31/2013 16:19     Performed at [facility]   Culture     Final   Value:  STAPHYLOCOCCUS SPECIES (COAGULASE NEGATIVE)     Note: THE SIGNIFICANCE OF ISOLATING THIS ORGANISM FROM A SINGLE SET OF BLOOD CULTURES WHEN MULTIPLE SETS ARE DRAWN IS UNCERTAIN. PLEASE NOTIFY THE MICROBIOLOGY DEPARTMENT WITHIN ONE WEEK IF SPECIATION AND SENSITIVITIES ARE REQUIRED.     MICROCOCCUS SPECIES     Note: Standardized susceptibility testing for this organism is not available.     Note: Gram Stain Report Called to,Read Back By and Verified With: RACHEL [email or web address]  ON [id number] BY [facility]   Report Status 08/05/2013 FINAL   Final  CULTURE, BLOOD (ROUTINE X 2)     Status: None   Collection Time    07/31/13 11:25 AM      Result Value Ref Range Status   Specimen Description BLOOD RIGHT ARM UPPER   Final   Special Requests BOTTLES DRAWN AEROBIC AND ANAEROBIC 5CC   Final   Culture  Setup Time     Final   Value: 07/31/2013 14:25     Performed at [facility]   Culture     Final   Value: DIPHTHEROIDS(CORYNEBACTERIUM SPECIES)     Note: Standardized susceptibility testing for this organism is not available.     Note: Gram Stain Report Called to,Read Back By and Verified With: MONICA  CROSS ON 08/01/2013 AT 9:35P BY Dennard Nip     Performed at [facility]   Report Status 08/03/2013 FINAL   Final  URINE CULTURE     Status: None   Collection Time    07/31/13 12:19 PM      Result Value Ref Range Status   Specimen Description URINE, CLEAN CATCH   Final   Special Requests NONE   Final   Culture  Setup Time     Final   Value: 07/31/2013 12:54     Performed at [facility]     Final   Value: NO GROWTH     Performed at [facility]   Culture     Final   Value: NO GROWTH     Performed at [facility]   Report Status 08/01/2013 FINAL   Final  MRSA PCR SCREENING     Status: None   Collection Time    07/31/13  9:02 PM      Result Value Ref Range Status   MRSA by PCR NEGATIVE  NEGATIVE Final   Comment:            The GeneXpert MRSA Assay (FDA      approved for NASAL specimens     only), is one component of a     comprehensive MRSA colonization     surveillance program. It is not     intended to diagnose MRSA     infection nor to guide or     monitor treatment for     MRSA infections.  URINE CULTURE     Status: None   Collection Time    08/05/13  9:05 AM      Result Value Ref Range Status   Specimen Description URINE, CATHETERIZED   Final   Special Requests Normal   Final   Culture  Setup Time     Final   Value: 08/05/2013 10:47     Performed at [facility]     Final   Value: NO GROWTH     Performed at [facility]   Culture     Final   Value: NO GROWTH     Performed at [facility]   Report Status 08/06/2013 FINAL   Final  CULTURE, ROUTINE-ABSCESS     Status: None   Collection Time    08/05/13 12:32 PM      Result Value Ref Range Status   Specimen Description ABSCESS   Final   Special Requests ASPIRATION OF SUBCUTANEOUS FLUID COLLECTION   Final   Gram Stain     Final   Value: ABUNDANT WBC PRESENT,BOTH PMN AND MONONUCLEAR     NO SQUAMOUS EPITHELIAL CELLS SEEN     FEW GRAM POSITIVE COCCI     IN PAIRS     Performed at [facility]   Culture     Final   Value: NO GROWTH 1 DAY     Performed at [facility]   Report Status PENDING   Incomplete  CULTURE, ROUTINE-ABSCESS     Status: None   Collection Time    08/05/13 12:32 PM      Result Value Ref Range Status   Specimen Description ABSCESS   Final   Special Requests ASPIRATE OF PERIHEPATIC COLLECTION   Final   Gram Stain     Final   Value: ABUNDANT WBC PRESENT,BOTH PMN AND MONONUCLEAR  NO SQUAMOUS EPITHELIAL CELLS SEEN     RARE GRAM POSITIVE COCCI     IN PAIRS     Performed at [facility]   Culture     Final   Value: NO GROWTH 1 DAY     Performed at [facility]   Report Status PENDING   Incomplete  ANAEROBIC CULTURE     Status: None   Collection Time    08/05/13 12:32 PM      Result  Value Ref Range Status   Specimen Description ABSCESS   Final   Special Requests ASPIRATE OF PERIHEPATIC COLLECTION   Final   Gram Stain     Final   Value: ABUNDANT WBC PRESENT,BOTH PMN AND MONONUCLEAR     NO SQUAMOUS EPITHELIAL CELLS SEEN     RARE GRAM POSITIVE COCCI     IN PAIRS     Performed at [facility]   Culture     Final   Value: NO ANAEROBES ISOLATED; CULTURE IN PROGRESS FOR 5 DAYS     Performed at [facility]   Report Status PENDING   Incomplete  ANAEROBIC CULTURE     Status: None   Collection Time    08/05/13 12:32 PM      Result Value Ref Range Status   Specimen Description ABSCESS   Final   Special Requests ASPIRATE OF SUBUTANEOUS FLUID COLLECTION   Final   Gram Stain     Final   Value: ABUNDANT WBC PRESENT,BOTH PMN AND MONONUCLEAR     NO SQUAMOUS EPITHELIAL CELLS SEEN     FEW GRAM POSITIVE COCCI     IN PAIRS     Performed at [facility]   Culture     Final   Value: NO ANAEROBES ISOLATED; CULTURE IN PROGRESS FOR 5 DAYS     Performed at [facility]   Report Status PENDING   Incomplete  MRSA PCR SCREENING     Status: None   Collection Time    08/05/13  5:54 PM      Result Value Ref Range Status   MRSA by PCR NEGATIVE  NEGATIVE Final   Comment:            The GeneXpert MRSA Assay (FDA     approved for NASAL specimens     only), is one component of a     comprehensive MRSA colonization     surveillance program. It is not     intended to diagnose MRSA     infection nor to guide or     monitor treatment for     MRSA infections.    Anti-infectives   Start     Dose/Rate Route Frequency Ordered Stop   08/04/13 0500  vancomycin (VANCOCIN) 1,250 mg in sodium chloride 0.9 % 250 mL IVPB     1,250 mg 166.7 mL/hr over 90 Minutes Intravenous Every 12 hours 08/03/13 1507     08/02/13 1200  levofloxacin (LEVAQUIN) IVPB 500 mg  Status:  Discontinued     500 mg 100 mL/hr over 60 Minutes Intravenous Every 48 hours 07/31/13 1812 07/31/13  2002   08/02/13 1000  ciprofloxacin (CIPRO) IVPB 400 mg     400 mg 200 mL/hr over 60 Minutes Intravenous Every 12 hours 08/02/13 0911     08/02/13 0400  vancomycin (VANCOCIN) 1,750 mg in sodium chloride 0.9 % 500 mL IVPB  Status:  Discontinued     1,750 mg 250 mL/hr over  120 Minutes Intravenous Every 24 hours 08/02/13 0357 08/03/13 1507   08/01/13 1000  ciprofloxacin (CIPRO) IVPB 400 mg  Status:  Discontinued     400 mg 200 mL/hr over 60 Minutes Intravenous Every 24 hours 08/01/13 0959 08/02/13 0911   07/31/13 2015  vancomycin (VANCOCIN) 2,500 mg in sodium chloride 0.9 % 500 mL IVPB     2,500 mg 250 mL/hr over 120 Minutes Intravenous  Once 07/31/13 2000 07/31/13 2346   07/31/13 2000  metroNIDAZOLE (FLAGYL) IVPB 500 mg     500 mg 100 mL/hr over 60 Minutes Intravenous Every 8 hours 07/31/13 1812     07/31/13 1830  aztreonam (AZACTAM) 1 g in dextrose 5 % 50 mL IVPB  Status:  Discontinued     1 g 100 mL/hr over 30 Minutes Intravenous Every 8 hours 07/31/13 1815 08/01/13 0958   07/31/13 1130  levofloxacin (LEVAQUIN) IVPB 750 mg     750 mg 100 mL/hr over 90 Minutes Intravenous  Once 07/31/13 1123 07/31/13 1310   07/31/13 1130  metroNIDAZOLE (FLAGYL) IVPB 500 mg     500 mg 100 mL/hr over 60 Minutes Intravenous  Once 07/31/13 1123 07/31/13 1310     Assessment: 58 y/o F with empiric antibiotic for Peridiverticular abscess + liver lesion/intra-abdominal infection.  Currently on Flagyl, Vancomycin, and Ciprofloxacin.  Tmax 100, WBC increased to 25.5, and Scr increased to 1.19 (Crcl ~70 ml/min).  Vancomycin random on 4/3 was 13.4 and Vancomycin dose was adjusted.  4/1 Flagyl>> 4/1 Vanc>> Vanc 2.5gm 4/1>>4/3 VR 13.4>>start 1750mg  IV q24h 4/1 Cipro>>  4/1 Urine Cx >>neg 4/1 Blood Cx x2>>1/2 CONS; 1/2 Diptheroids (contaminants) MRSA PCR neg  4/6 anaroebic cx (x2):in process 4/6 abscess cx (x2): in process 4/6 Urn cx: in process  Goal of Therapy:  Vancomycin trough level 15-20  mcg/ml  Plan:  -Continue vancomycin 1250mg  Q12h  -F/up VT today at [facility] with current Vancomycin dose, VT at 1630 before 1700 dose -Trend WBC, temp, renal function, cultures -Continue flagyl and ciprofloxacin  Jeronimo Norma, PharmD Clinical Pharmacist Resident Pager: [phone] 08/06/2013 1:27 PM   Addendum: Vancomycin trough is 20.9 on 1250 mg IV q12h. As mentioned above, SCr bumped up a little today, UOP looks stable.  Decrease vancomycin to 1000 mg IV q12h. Recheck steady state vancomycin level prior to 4th dose.  [place], Pharm.D., BCPS Clinical Pharmacist Pager: [phone] 08/06/2013 5:59 PM

## 2013-08-07 ENCOUNTER — Other Ambulatory Visit: Payer: BC Managed Care – PPO

## 2013-08-07 DIAGNOSIS — I82409 Acute embolism and thrombosis of unspecified deep veins of unspecified lower extremity: Secondary | ICD-10-CM

## 2013-08-07 DIAGNOSIS — E119 Type 2 diabetes mellitus without complications: Secondary | ICD-10-CM

## 2013-08-07 DIAGNOSIS — E669 Obesity, unspecified: Secondary | ICD-10-CM

## 2013-08-07 DIAGNOSIS — K625 Hemorrhage of anus and rectum: Secondary | ICD-10-CM

## 2013-08-07 DIAGNOSIS — I369 Nonrheumatic tricuspid valve disorder, unspecified: Secondary | ICD-10-CM

## 2013-08-07 DIAGNOSIS — I10 Essential (primary) hypertension: Secondary | ICD-10-CM

## 2013-08-07 DIAGNOSIS — R0602 Shortness of breath: Secondary | ICD-10-CM

## 2013-08-07 LAB — CBC
HCT: 24.6 % — ABNORMAL LOW (ref 36.0–46.0)
HEMATOCRIT: 23.9 % — AB (ref 36.0–46.0)
Hemoglobin: 8 g/dL — ABNORMAL LOW (ref 12.0–15.0)
Hemoglobin: 8.3 g/dL — ABNORMAL LOW (ref 12.0–15.0)
MCH: 26.4 pg (ref 26.0–34.0)
MCH: 26.5 pg (ref 26.0–34.0)
MCHC: 33.5 g/dL (ref 30.0–36.0)
MCHC: 33.7 g/dL (ref 30.0–36.0)
MCV: 78.9 fL (ref 78.0–100.0)
MCV: 78.6 fL (ref 78.0–100.0)
PLATELETS: 201 10*3/uL (ref 150–400)
Platelets: 178 10*3/uL (ref 150–400)
RBC: 3.03 MIL/uL — ABNORMAL LOW (ref 3.87–5.11)
RBC: 3.13 MIL/uL — ABNORMAL LOW (ref 3.87–5.11)
RDW: 15.9 % — ABNORMAL HIGH (ref 11.5–15.5)
RDW: 15.9 % — AB (ref 11.5–15.5)
WBC: 24.3 10*3/uL — ABNORMAL HIGH (ref 4.0–10.5)
WBC: 25.6 10*3/uL — ABNORMAL HIGH (ref 4.0–10.5)

## 2013-08-07 LAB — BASIC METABOLIC PANEL
BUN: 21 mg/dL (ref 6–23)
CO2: 23 mEq/L (ref 19–32)
Calcium: 7.7 mg/dL — ABNORMAL LOW (ref 8.4–10.5)
Chloride: 107 mEq/L (ref 96–112)
Creatinine, Ser: 1.14 mg/dL — ABNORMAL HIGH (ref 0.50–1.10)
GFR, EST AFRICAN AMERICAN: 62 mL/min — AB (ref >90)
GFR, EST NON AFRICAN AMERICAN: 53 mL/min — AB (ref >90)
Glucose, Bld: 118 mg/dL — ABNORMAL HIGH (ref 70–99)
POTASSIUM: 3.6 meq/L — AB (ref 3.7–5.3)
SODIUM: 142 meq/L (ref 137–147)

## 2013-08-07 LAB — TROPONIN I
Troponin I: <0.3 ng/mL (ref <0.30)
Troponin I: <0.3 ng/mL (ref <0.30)
Troponin I: <0.3 ng/mL (ref <0.30)

## 2013-08-07 LAB — TSH: TSH: 4.46 u[IU]/mL (ref 0.350–4.500)

## 2013-08-07 LAB — MAGNESIUM: Magnesium: 1.7 mg/dL (ref 1.5–2.5)

## 2013-08-07 MED ORDER — SODIUM CHLORIDE 0.9 % IV SOLN
500.0000 mg | Freq: Four times a day (QID) | INTRAVENOUS | Status: DC
  Filled 2013-08-07 (×3): qty 500

## 2013-08-07 MED ORDER — SODIUM CHLORIDE 0.9 % IV SOLN
500.0000 mg | Freq: Four times a day (QID) | INTRAVENOUS | Status: DC
  Administered 2013-08-07 – 2013-08-08 (×3): 500 mg via INTRAVENOUS
  Filled 2013-08-07 (×8): qty 500

## 2013-08-07 MED ORDER — METOPROLOL TARTRATE 25 MG PO TABS
25.0000 mg | ORAL_TABLET | Freq: Two times a day (BID) | ORAL | Status: DC
  Administered 2013-08-07 – 2013-08-10 (×7): 25 mg via ORAL
  Filled 2013-08-07 (×8): qty 1

## 2013-08-07 MED ORDER — POTASSIUM CHLORIDE CRYS ER 20 MEQ PO TBCR
40.0000 meq | EXTENDED_RELEASE_TABLET | Freq: Once | ORAL | Status: AC
  Administered 2013-08-07: 40 meq via ORAL
  Filled 2013-08-07: qty 2

## 2013-08-07 MED ORDER — SODIUM CHLORIDE 0.9 % IV SOLN
500.0000 mg | Freq: Four times a day (QID) | INTRAVENOUS | Status: DC
  Filled 2013-08-07 (×2): qty 500

## 2013-08-07 MED ORDER — CIPROFLOXACIN IN D5W 400 MG/200ML IV SOLN
400.0000 mg | Freq: Two times a day (BID) | INTRAVENOUS | Status: DC
  Administered 2013-08-07: 400 mg via INTRAVENOUS
  Filled 2013-08-07 (×2): qty 200

## 2013-08-07 MED ORDER — METOPROLOL TARTRATE 1 MG/ML IV SOLN
INTRAVENOUS | Status: AC
  Filled 2013-08-07: qty 5

## 2013-08-07 MED ORDER — METRONIDAZOLE IN NACL 5-0.79 MG/ML-% IV SOLN
500.0000 mg | Freq: Three times a day (TID) | INTRAVENOUS | Status: DC
  Administered 2013-08-07: 500 mg via INTRAVENOUS
  Filled 2013-08-07 (×3): qty 100

## 2013-08-07 MED ORDER — VANCOMYCIN HCL IN DEXTROSE 1-5 GM/200ML-% IV SOLN
1000.0000 mg | Freq: Two times a day (BID) | INTRAVENOUS | Status: DC
  Administered 2013-08-07 – 2013-08-08 (×2): 1000 mg via INTRAVENOUS
  Filled 2013-08-07 (×4): qty 200

## 2013-08-07 MED ORDER — MAGNESIUM SULFATE 40 MG/ML IJ SOLN
2.0000 g | Freq: Once | INTRAMUSCULAR | Status: AC
  Administered 2013-08-07: 2 g via INTRAVENOUS
  Filled 2013-08-07: qty 50

## 2013-08-07 MED ORDER — MAGNESIUM OXIDE 400 (241.3 MG) MG PO TABS
400.0000 mg | ORAL_TABLET | Freq: Once | ORAL | Status: AC
  Administered 2013-08-07: 400 mg via ORAL
  Filled 2013-08-07: qty 1

## 2013-08-07 NOTE — Progress Notes (Signed)
Subjective:  She is doing better, liver bx is below as are the other culture are also below, she is in sinus now on telemetry.  Objective: Vital signs in last 24 hours: Temp:  [97.5 F (36.4 C)-99.3 F (37.4 C)] 98.7 F (37.1 C) (04/08 0852) Pulse Rate:  [93-167] 101 (04/08 0700) Resp:  [21-40] 28 (04/08 0700) BP: (100-154)/(64-92) 128/84 mmHg (04/08 0700) SpO2:  [93 %-99 %] 97 % (04/08 0700) Last BM Date: 08/06/13 PO not recorded. + BM x1 Afebrile, VSS, she has had at least 2 episodes of ST. WBC up to 25.6 no improvement, H/H is stable. VSS afebrile Diet is up to full liquids this AM Diagnosis LIVER, FINE NEEDLE ASPIRATION, PERIHEPATIC FLUID NO MALIGNANT CELLS IDENTIFIED. ACUTE INFLAMMATION.  Urine culture:  No growth  Special Requests  ASPIRATION OF SUBCUTANEOUS FLUID COLLECTION   Gram Stain  ABUNDANT WBC PRESENT,BOTH PMN AND MONONUCLEAR NO SQUAMOUS EPITHELIAL CELLS SEEN FEW GRAM POSITIVE COCCI IN PAIRS    Special Requests  ASPIRATE OF PERIHEPATIC COLLECTION   Gram Stain  ABUNDANT WBC PRESENT,BOTH PMN AND MONONUCLEAR NO SQUAMOUS EPITHELIAL CELLS SEEN RARE GRAM POSITIVE COCCI IN PAIRS    Culture Blood DIPHTHEROIDS(CORYNEBACTERIUM SPECIES) Note: Standardized susceptibility testing for this organism is not available.       Intake/Output from previous day: 04/07 0701 - 04/08 0700 In: 3480.4 [I.V.:2360.4; IV Piggyback:1100] Out: 554 [Urine:480; Drains:74] Intake/Output this shift:    General appearance: alert, cooperative and no distress GI: large, abdomen, sore around the drain.  Otherwise she is not distended or tender. She has good BS,   Lab Results:   Recent Labs  08/07/13 0008 08/07/13 0729  WBC 24.3* 25.6*  HGB 8.3* 8.0*  HCT 24.6* 23.9*  PLT 178 201    BMET  Recent Labs  08/06/13 1703 08/07/13 0410  NA 143 142  K 3.6* 3.6*  CL 107 107  CO2 23 23  GLUCOSE 98 118*  BUN 24* 21  CREATININE 1.17* 1.14*  CALCIUM 7.8* 7.7*    PT/INR  Recent Labs  08/05/13 2013 08/06/13 0240  LABPROT 19.6* 18.2*  INR 1.71* 1.55*     Recent Labs Lab 07/31/13 2320 08/02/13 0300 08/04/13 0500 08/05/13 0500 08/06/13 0240  AST 83* 87* 23 17 84*  ALT 129* 120* 53* 35 48*  ALKPHOS 85 66 81 91 78  BILITOT 1.1 0.9 0.7 0.8 1.0  PROT 6.7 6.3 6.1 6.0 5.4*  ALBUMIN 2.4* 2.0* 1.9* 1.9* 1.8*     Lipase     Component Value Date/Time   LIPASE 27.0 02/08/2013 1345     Studies/Results: Ct Abdomen Pelvis Wo Contrast  08/05/2013   CLINICAL DATA:  Evaluate intra-abdominal hemorrhage. Angiogram this morning. Known liver mass.  EXAM: CT ABDOMEN AND PELVIS WITHOUT CONTRAST  TECHNIQUE: Multidetector CT imaging of the abdomen and pelvis was performed following the standard protocol without intravenous contrast.  COMPARISON:  US BIOPSY dated 08/05/2013; CT ABD/PELV WO CM dated 08/01/2013; CT ABD/PELV WO CM dated 07/31/2013  FINDINGS: Small bilateral pleural effusions with bilateral lower lobe atelectasis.  There is a pigtail drainage catheter in the midline of the upper abdomen, extending inferior to the right hepatic lobe. The previously seen sub hepatic fluid collection is being drained by the pigtail drainage catheter and collection is now decompressed. There is gas within the left hepatic lobe mass. Left hepatic lobe mass measures 10 cm AP x 13.8 cm transverse. This is larger than on the prior examination and there is some increased attenuation  within the mass, some of which represents Gel-Foam and some of which represents hemorrhage associated with instrumentation earlier today. There is an anterior left hepatic lobe subcapsular hematoma which is difficult to clearly define but measures 3.3 cm in thickness with layering hematocrit level.  In the anatomic pelvis, there is an air-fluid level which appears distinct from the collapsed urinary bladder which has a Foley catheter. This may be within the deep pelvic abscess anterior to the uterus however  the pelvis is poorly visualized in the absence of IV contrast. Hemo peritoneum is present tracking along the left abdominal wall, outlining the pericolic gutter.  Vicarious excretion of contrast is present in the gallbladder. Kidneys and adrenals appear within normal limits. There is no bowel obstruction. Fluid collection in the anterior abdominal wall appears slightly smaller compatible with needle aspiration. Foley catheter present urinary bladder. 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  IMPRESSION: 1. Interval placement of pigtail drainage catheter in the subhepatic region inferior to the left hepatic lobe. 2. Small amount of bleeding into the left hepatic lobe mass. New gas within the mass is compatible with Gel-Foam from procedure or earlier today. 3. Small anterior left hepatic lobe subcapsular hematoma with hematocrit level. 4. Moderate hemoperitoneum is new. 5. Probable abscess in the anterior anatomic pelvis superior to the dome of the urinary bladder (image 76 series 2).   Electronically Signed   By: Dereck Ligas M.D.   On: 08/05/2013 17:16   [place] Abscess Drain  08/05/2013   CLINICAL DATA:  11 cm left hepatic mass, subcutaneous soft tissue nodule and perihepatic fluid collection by prior CT.  EXAM: 1. ULTRASOUND-GUIDED CORE BIOPSY OF THE LIVER 2. ULTRASOUND-GUIDED ASPIRATION OF SUBCUTANEOUS FLUID COLLECTION 3. ULTRASOUND-GUIDED PERCUTANEOUS CATHETER DRAINAGE OF PERIHEPATIC ABSCESS  MEDICATIONS: 3.0 mg IV Versed; 100 mcg IV Fentanyl  Total Moderate Sedation Time: 24 minutes.  PROCEDURE: The procedure, risks, benefits, and alternatives were explained to the patient. Questions regarding the procedure were encouraged and answered. The patient understands and consents to the procedure.  The abdominal wall was prepped with Betadine in a sterile fashion, and a sterile drape was applied covering the operative field. A sterile gown and sterile gloves were used for the procedure. Local  anesthesia was provided with 1% Lidocaine.  The liver, subcutaneous tissues and perihepatic region were first examined by ultrasound. An 18 gauge needle was advanced into the midline subcutaneous abdominal wall fluid collection and aspiration performed. Aspirated fluid sample was sent for culture analysis.  Ultrasound-guided biopsy was performed of a hepatic mass in the left lobe of the liver. A 17 gauge needle was advanced into the lesion. A total of 2 separate 18 gauge core biopsy samples were obtained and submitted in formalin. A slurry of sterile saline and Gel-Foam pledgets was injected through the outer needle as it was retracted out of the liver. Ultrasound was performed immediately post biopsy.  An 18 gauge needle was advanced into a left perihepatic/ subhepatic fluid collection. Aspiration was performed. A fluid sample was sent for culture. A guidewire was advanced into the collection. The tract was dilated and a 12 [place] percutaneous drain advanced into the collection. The drain was connected to a suction bulb. It was secured at the skin with a Prolene retention suture and StatLock device.  COMPLICATIONS: None.  FINDINGS: The midline subcutaneous abnormality superficial to the abdominal wall seen by CT corresponds to a complex fluid collection by ultrasound with some surrounding tissue thickening. Aspiration yielded grossly purulent fluid. The cavity  was largely decompressed but needle aspiration and knee fluid sample sent for culture analysis.  The nearly 11 cm heterogeneous mass in the lateral segment of the left lobe of the liver was sampled, revealing solid tissue. There was considerable bleeding from the trocar needle after core biopsy and Gel-Foam pledgets were instilled as the outer needle was removed. No active bleeding was visualized from the surface of the liver by ultrasound immediately after completion of the biopsy.  The complex fluid collection adjacent to the left lobe of the liver and  extending into the peritoneal cavity shows visible enlargement by ultrasound compared to a prior ultrasound 5 days ago with maximal dimensions of approximately 10 cm. This also appears to communicate with some additional complex fluid in the midline abdomen. Aspiration yielded bloody and turbid fluid which appeared likely infected. For this reason, a percutaneous drain was placed. A 12 French drain was placed over a wire and attached to a suction bulb. Fluid samples from this collection were sent for culture analysis as well as cytology.  IMPRESSION: 1. Aspiration of midline subcutaneous fluid collection yielded purulent fluid. The small collection was nearly completely decompressed by needle aspiration and a sample of fluid sent for culture analysis. 2. Ultrasound-guided core biopsy was performed of the large left lobe hepatic mass. The percutaneous tract was embolized with Gel-Foam pledgets due to bleeding from the outer needle during the biopsy. There was no immediate evidence of active bleeding from the surface of the liver upon completion of the biopsy. Core samples were sent for histologic analysis. 3. Enlargement of complex fluid collection adjacent to the left lobe of the liver and extending into the peritoneal cavity. Aspiration yielded bloody and turbid fluid which was sent for culture analysis and cytologic analysis. A 12 French percutaneous drain was placed in this collection and attached to a suction bulb. Output will be followed.   Electronically Signed   By: Aletta Edouard M.D.   On: 08/05/2013 13:45   US Biopsy  08/05/2013   CLINICAL DATA:  11 cm left hepatic mass, subcutaneous soft tissue nodule and perihepatic fluid collection by prior CT.  EXAM: 1. ULTRASOUND-GUIDED CORE BIOPSY OF THE LIVER 2. ULTRASOUND-GUIDED ASPIRATION OF SUBCUTANEOUS FLUID COLLECTION 3. ULTRASOUND-GUIDED PERCUTANEOUS CATHETER DRAINAGE OF PERIHEPATIC ABSCESS  MEDICATIONS: 3.0 mg IV Versed; 100 mcg IV Fentanyl  Total Moderate  Sedation Time: 24 minutes.  PROCEDURE: The procedure, risks, benefits, and alternatives were explained to the patient. Questions regarding the procedure were encouraged and answered. The patient understands and consents to the procedure.  The abdominal wall was prepped with Betadine in a sterile fashion, and a sterile drape was applied covering the operative field. A sterile gown and sterile gloves were used for the procedure. Local anesthesia was provided with 1% Lidocaine.  The liver, subcutaneous tissues and perihepatic region were first examined by ultrasound. An 18 gauge needle was advanced into the midline subcutaneous abdominal wall fluid collection and aspiration performed. Aspirated fluid sample was sent for culture analysis.  Ultrasound-guided biopsy was performed of a hepatic mass in the left lobe of the liver. A 17 gauge needle was advanced into the lesion. A total of 2 separate 18 gauge core biopsy samples were obtained and submitted in formalin. A slurry of sterile saline and Gel-Foam pledgets was injected through the outer needle as it was retracted out of the liver. Ultrasound was performed immediately post biopsy.  An 18 gauge needle was advanced into a left perihepatic/ subhepatic fluid collection. Aspiration was performed.  A fluid sample was sent for culture. A guidewire was advanced into the collection. The tract was dilated and a 12 [place] percutaneous drain advanced into the collection. The drain was connected to a suction bulb. It was secured at the skin with a Prolene retention suture and StatLock device.  COMPLICATIONS: None.  FINDINGS: The midline subcutaneous abnormality superficial to the abdominal wall seen by CT corresponds to a complex fluid collection by ultrasound with some surrounding tissue thickening. Aspiration yielded grossly purulent fluid. The cavity was largely decompressed but needle aspiration and knee fluid sample sent for culture analysis.  The nearly 11 cm heterogeneous  mass in the lateral segment of the left lobe of the liver was sampled, revealing solid tissue. There was considerable bleeding from the trocar needle after core biopsy and Gel-Foam pledgets were instilled as the outer needle was removed. No active bleeding was visualized from the surface of the liver by ultrasound immediately after completion of the biopsy.  The complex fluid collection adjacent to the left lobe of the liver and extending into the peritoneal cavity shows visible enlargement by ultrasound compared to a prior ultrasound 5 days ago with maximal dimensions of approximately 10 cm. This also appears to communicate with some additional complex fluid in the midline abdomen. Aspiration yielded bloody and turbid fluid which appeared likely infected. For this reason, a percutaneous drain was placed. A 12 French drain was placed over a wire and attached to a suction bulb. Fluid samples from this collection were sent for culture analysis as well as cytology.  IMPRESSION: 1. Aspiration of midline subcutaneous fluid collection yielded purulent fluid. The small collection was nearly completely decompressed by needle aspiration and a sample of fluid sent for culture analysis. 2. Ultrasound-guided core biopsy was performed of the large left lobe hepatic mass. The percutaneous tract was embolized with Gel-Foam pledgets due to bleeding from the outer needle during the biopsy. There was no immediate evidence of active bleeding from the surface of the liver upon completion of the biopsy. Core samples were sent for histologic analysis. 3. Enlargement of complex fluid collection adjacent to the left lobe of the liver and extending into the peritoneal cavity. Aspiration yielded bloody and turbid fluid which was sent for culture analysis and cytologic analysis. A 12 French percutaneous drain was placed in this collection and attached to a suction bulb. Output will be followed.   Electronically Signed   By: Aletta Edouard  M.D.   On: 08/05/2013 13:45   US Aspiration  08/05/2013   CLINICAL DATA:  11 cm left hepatic mass, subcutaneous soft tissue nodule and perihepatic fluid collection by prior CT.  EXAM: 1. ULTRASOUND-GUIDED CORE BIOPSY OF THE LIVER 2. ULTRASOUND-GUIDED ASPIRATION OF SUBCUTANEOUS FLUID COLLECTION 3. ULTRASOUND-GUIDED PERCUTANEOUS CATHETER DRAINAGE OF PERIHEPATIC ABSCESS  MEDICATIONS: 3.0 mg IV Versed; 100 mcg IV Fentanyl  Total Moderate Sedation Time: 24 minutes.  PROCEDURE: The procedure, risks, benefits, and alternatives were explained to the patient. Questions regarding the procedure were encouraged and answered. The patient understands and consents to the procedure.  The abdominal wall was prepped with Betadine in a sterile fashion, and a sterile drape was applied covering the operative field. A sterile gown and sterile gloves were used for the procedure. Local anesthesia was provided with 1% Lidocaine.  The liver, subcutaneous tissues and perihepatic region were first examined by ultrasound. An 18 gauge needle was advanced into the midline subcutaneous abdominal wall fluid collection and aspiration performed. Aspirated fluid sample was sent for culture  analysis.  Ultrasound-guided biopsy was performed of a hepatic mass in the left lobe of the liver. A 17 gauge needle was advanced into the lesion. A total of 2 separate 18 gauge core biopsy samples were obtained and submitted in formalin. A slurry of sterile saline and Gel-Foam pledgets was injected through the outer needle as it was retracted out of the liver. Ultrasound was performed immediately post biopsy.  An 18 gauge needle was advanced into a left perihepatic/ subhepatic fluid collection. Aspiration was performed. A fluid sample was sent for culture. A guidewire was advanced into the collection. The tract was dilated and a 12 [place] percutaneous drain advanced into the collection. The drain was connected to a suction bulb. It was secured at the skin with a  Prolene retention suture and StatLock device.  COMPLICATIONS: None.  FINDINGS: The midline subcutaneous abnormality superficial to the abdominal wall seen by CT corresponds to a complex fluid collection by ultrasound with some surrounding tissue thickening. Aspiration yielded grossly purulent fluid. The cavity was largely decompressed but needle aspiration and knee fluid sample sent for culture analysis.  The nearly 11 cm heterogeneous mass in the lateral segment of the left lobe of the liver was sampled, revealing solid tissue. There was considerable bleeding from the trocar needle after core biopsy and Gel-Foam pledgets were instilled as the outer needle was removed. No active bleeding was visualized from the surface of the liver by ultrasound immediately after completion of the biopsy.  The complex fluid collection adjacent to the left lobe of the liver and extending into the peritoneal cavity shows visible enlargement by ultrasound compared to a prior ultrasound 5 days ago with maximal dimensions of approximately 10 cm. This also appears to communicate with some additional complex fluid in the midline abdomen. Aspiration yielded bloody and turbid fluid which appeared likely infected. For this reason, a percutaneous drain was placed. A 12 French drain was placed over a wire and attached to a suction bulb. Fluid samples from this collection were sent for culture analysis as well as cytology.  IMPRESSION: 1. Aspiration of midline subcutaneous fluid collection yielded purulent fluid. The small collection was nearly completely decompressed by needle aspiration and a sample of fluid sent for culture analysis. 2. Ultrasound-guided core biopsy was performed of the large left lobe hepatic mass. The percutaneous tract was embolized with Gel-Foam pledgets due to bleeding from the outer needle during the biopsy. There was no immediate evidence of active bleeding from the surface of the liver upon completion of the biopsy.  Core samples were sent for histologic analysis. 3. Enlargement of complex fluid collection adjacent to the left lobe of the liver and extending into the peritoneal cavity. Aspiration yielded bloody and turbid fluid which was sent for culture analysis and cytologic analysis. A 12 French percutaneous drain was placed in this collection and attached to a suction bulb. Output will be followed.   Electronically Signed   By: Irish Lack M.D.   On: 08/05/2013 13:45    Medications: . ciprofloxacin  400 mg Intravenous Q12H  . lidocaine  15 mL Mouth/Throat Once  . metoprolol      . metronidazole  500 mg Intravenous Q8H  . vancomycin  1,000 mg Intravenous Q12H    Assessment/Plan Diverticulitis, continue ABX, allow clears  Liver mass and sub cut abdominal mass - AFP P,  S/p IR: ) Core biopsy of left lobe liver mass, 2) Aspiration of subcutaneous fluid collection, 3) Percutaneous drainage of perihepatic fluid. All under  ultrasound guidance.  12 Fr drain placed over wire and attached to suction bulb. ?infected hematoma vs abscess.  Post procedure tachycardia and hypotension Recurrent Sinus tachycardia    Plan:  Liver Bx perihepatic fluid is negative for malignancy, 1 blood culture + DIPHTHEROIDS(CORYNEBACTERIUM SPECIES).  Others are still pending. Will continue to follow.   LOS: 7 days    Earnstine Regal 08/07/2013

## 2013-08-07 NOTE — Progress Notes (Signed)
[facility]  2D Echocardiogram completed.  [place], RDCS 08/07/2013 2:14 PM

## 2013-08-07 NOTE — Progress Notes (Signed)
UR Completed.  Leah Peterson Leah Peterson [phone] 08/07/2013  

## 2013-08-07 NOTE — Progress Notes (Signed)
Subjective: Perihep abscess drain placed 4/6 Feels so much better Hepatic lesion bx 4/6: inflamm; necrosis Sub cu epigastric lesion aspiration 4/6: Gr+ cocci    Objective: Vital signs in last 24 hours: Temp:  [97.5 F (36.4 C)-99.3 F (37.4 C)] 98.7 F (37.1 C) (04/08 0852) Pulse Rate:  [93-167] 94 (04/08 1000) Resp:  [21-40] 32 (04/08 1000) BP: (113-154)/(64-92) 144/83 mmHg (04/08 1000) SpO2:  [93 %-100 %] 98 % (04/08 1000) Last BM Date: 08/06/13  Intake/Output from previous day: 04/07 0701 - 04/08 0700 In: 3480.4 [I.V.:2360.4; IV Piggyback:1100] Out: 554 [Urine:480; Drains:74] Intake/Output this shift: Total I/O In: 22 [I.V.:12; Other:10] Out: 25 [Drains:25]  PE:  Afeb; resting nad Site of perihep abscess drain is NT; no bleeding Output 25 cc yesterday; blood tinged 15 cc in JP now--evidence of some infection in bulb Cx: Gr+cocci   Lab Results:   Recent Labs  08/07/13 0008 08/07/13 0729  WBC 24.3* 25.6*  HGB 8.3* 8.0*  HCT 24.6* 23.9*  PLT 178 201   BMET  Recent Labs  08/06/13 1703 08/07/13 0410  NA 143 142  K 3.6* 3.6*  CL 107 107  CO2 23 23  GLUCOSE 98 118*  BUN 24* 21  CREATININE 1.17* 1.14*  CALCIUM 7.8* 7.7*   PT/INR  Recent Labs  08/05/13 2013 08/06/13 0240  LABPROT 19.6* 18.2*  INR 1.71* 1.55*   ABG No results found for this basename: PHART, PCO2, PO2, HCO3,  in the last 72 hours  Studies/Results: Ct Abdomen Pelvis Wo Contrast  08/05/2013   CLINICAL DATA:  Evaluate intra-abdominal hemorrhage. Angiogram this morning. Known liver mass.  EXAM: CT ABDOMEN AND PELVIS WITHOUT CONTRAST  TECHNIQUE: Multidetector CT imaging of the abdomen and pelvis was performed following the standard protocol without intravenous contrast.  COMPARISON:  US BIOPSY dated 08/05/2013; CT ABD/PELV WO CM dated 08/01/2013; CT ABD/PELV WO CM dated 07/31/2013  FINDINGS: Small bilateral pleural effusions with bilateral lower lobe atelectasis.  There is a pigtail  drainage catheter in the midline of the upper abdomen, extending inferior to the right hepatic lobe. The previously seen sub hepatic fluid collection is being drained by the pigtail drainage catheter and collection is now decompressed. There is gas within the left hepatic lobe mass. Left hepatic lobe mass measures 10 cm AP x 13.8 cm transverse. This is larger than on the prior examination and there is some increased attenuation within the mass, some of which represents Gel-Foam and some of which represents hemorrhage associated with instrumentation earlier today. There is an anterior left hepatic lobe subcapsular hematoma which is difficult to clearly define but measures 3.3 cm in thickness with layering hematocrit level.  In the anatomic pelvis, there is an air-fluid level which appears distinct from the collapsed urinary bladder which has a Foley catheter. This may be within the deep pelvic abscess anterior to the uterus however the pelvis is poorly visualized in the absence of IV contrast. Hemo peritoneum is present tracking along the left abdominal wall, outlining the pericolic gutter.  Vicarious excretion of contrast is present in the gallbladder. Kidneys and adrenals appear within normal limits. There is no bowel obstruction. Fluid collection in the anterior abdominal wall appears slightly smaller compatible with needle aspiration. Foley catheter present urinary bladder. 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  IMPRESSION: 1. Interval placement of pigtail drainage catheter in the subhepatic region inferior to the left hepatic lobe. 2. Small amount of bleeding into the left hepatic lobe mass. New gas within the mass  is compatible with Gel-Foam from procedure or earlier today. 3. Small anterior left hepatic lobe subcapsular hematoma with hematocrit level. 4. Moderate hemoperitoneum is new. 5. Probable abscess in the anterior anatomic pelvis superior to the dome of the urinary bladder (image  76 series 2).   Electronically Signed   By: Dereck Ligas M.D.   On: 08/05/2013 17:16   [place] Abscess Drain  08/05/2013   CLINICAL DATA:  11 cm left hepatic mass, subcutaneous soft tissue nodule and perihepatic fluid collection by prior CT.  EXAM: 1. ULTRASOUND-GUIDED CORE BIOPSY OF THE LIVER 2. ULTRASOUND-GUIDED ASPIRATION OF SUBCUTANEOUS FLUID COLLECTION 3. ULTRASOUND-GUIDED PERCUTANEOUS CATHETER DRAINAGE OF PERIHEPATIC ABSCESS  MEDICATIONS: 3.0 mg IV Versed; 100 mcg IV Fentanyl  Total Moderate Sedation Time: 24 minutes.  PROCEDURE: The procedure, risks, benefits, and alternatives were explained to the patient. Questions regarding the procedure were encouraged and answered. The patient understands and consents to the procedure.  The abdominal wall was prepped with Betadine in a sterile fashion, and a sterile drape was applied covering the operative field. A sterile gown and sterile gloves were used for the procedure. Local anesthesia was provided with 1% Lidocaine.  The liver, subcutaneous tissues and perihepatic region were first examined by ultrasound. An 18 gauge needle was advanced into the midline subcutaneous abdominal wall fluid collection and aspiration performed. Aspirated fluid sample was sent for culture analysis.  Ultrasound-guided biopsy was performed of a hepatic mass in the left lobe of the liver. A 17 gauge needle was advanced into the lesion. A total of 2 separate 18 gauge core biopsy samples were obtained and submitted in formalin. A slurry of sterile saline and Gel-Foam pledgets was injected through the outer needle as it was retracted out of the liver. Ultrasound was performed immediately post biopsy.  An 18 gauge needle was advanced into a left perihepatic/ subhepatic fluid collection. Aspiration was performed. A fluid sample was sent for culture. A guidewire was advanced into the collection. The tract was dilated and a 12 [place] percutaneous drain advanced into the collection. The drain was  connected to a suction bulb. It was secured at the skin with a Prolene retention suture and StatLock device.  COMPLICATIONS: None.  FINDINGS: The midline subcutaneous abnormality superficial to the abdominal wall seen by CT corresponds to a complex fluid collection by ultrasound with some surrounding tissue thickening. Aspiration yielded grossly purulent fluid. The cavity was largely decompressed but needle aspiration and knee fluid sample sent for culture analysis.  The nearly 11 cm heterogeneous mass in the lateral segment of the left lobe of the liver was sampled, revealing solid tissue. There was considerable bleeding from the trocar needle after core biopsy and Gel-Foam pledgets were instilled as the outer needle was removed. No active bleeding was visualized from the surface of the liver by ultrasound immediately after completion of the biopsy.  The complex fluid collection adjacent to the left lobe of the liver and extending into the peritoneal cavity shows visible enlargement by ultrasound compared to a prior ultrasound 5 days ago with maximal dimensions of approximately 10 cm. This also appears to communicate with some additional complex fluid in the midline abdomen. Aspiration yielded bloody and turbid fluid which appeared likely infected. For this reason, a percutaneous drain was placed. A 12 French drain was placed over a wire and attached to a suction bulb. Fluid samples from this collection were sent for culture analysis as well as cytology.  IMPRESSION: 1. Aspiration of midline subcutaneous fluid collection yielded  purulent fluid. The small collection was nearly completely decompressed by needle aspiration and a sample of fluid sent for culture analysis. 2. Ultrasound-guided core biopsy was performed of the large left lobe hepatic mass. The percutaneous tract was embolized with Gel-Foam pledgets due to bleeding from the outer needle during the biopsy. There was no immediate evidence of active bleeding  from the surface of the liver upon completion of the biopsy. Core samples were sent for histologic analysis. 3. Enlargement of complex fluid collection adjacent to the left lobe of the liver and extending into the peritoneal cavity. Aspiration yielded bloody and turbid fluid which was sent for culture analysis and cytologic analysis. A 12 French percutaneous drain was placed in this collection and attached to a suction bulb. Output will be followed.   Electronically Signed   By: Aletta Edouard M.D.   On: 08/05/2013 13:45   US Biopsy  08/05/2013   CLINICAL DATA:  11 cm left hepatic mass, subcutaneous soft tissue nodule and perihepatic fluid collection by prior CT.  EXAM: 1. ULTRASOUND-GUIDED CORE BIOPSY OF THE LIVER 2. ULTRASOUND-GUIDED ASPIRATION OF SUBCUTANEOUS FLUID COLLECTION 3. ULTRASOUND-GUIDED PERCUTANEOUS CATHETER DRAINAGE OF PERIHEPATIC ABSCESS  MEDICATIONS: 3.0 mg IV Versed; 100 mcg IV Fentanyl  Total Moderate Sedation Time: 24 minutes.  PROCEDURE: The procedure, risks, benefits, and alternatives were explained to the patient. Questions regarding the procedure were encouraged and answered. The patient understands and consents to the procedure.  The abdominal wall was prepped with Betadine in a sterile fashion, and a sterile drape was applied covering the operative field. A sterile gown and sterile gloves were used for the procedure. Local anesthesia was provided with 1% Lidocaine.  The liver, subcutaneous tissues and perihepatic region were first examined by ultrasound. An 18 gauge needle was advanced into the midline subcutaneous abdominal wall fluid collection and aspiration performed. Aspirated fluid sample was sent for culture analysis.  Ultrasound-guided biopsy was performed of a hepatic mass in the left lobe of the liver. A 17 gauge needle was advanced into the lesion. A total of 2 separate 18 gauge core biopsy samples were obtained and submitted in formalin. A slurry of sterile saline and Gel-Foam  pledgets was injected through the outer needle as it was retracted out of the liver. Ultrasound was performed immediately post biopsy.  An 18 gauge needle was advanced into a left perihepatic/ subhepatic fluid collection. Aspiration was performed. A fluid sample was sent for culture. A guidewire was advanced into the collection. The tract was dilated and a 12 [place] percutaneous drain advanced into the collection. The drain was connected to a suction bulb. It was secured at the skin with a Prolene retention suture and StatLock device.  COMPLICATIONS: None.  FINDINGS: The midline subcutaneous abnormality superficial to the abdominal wall seen by CT corresponds to a complex fluid collection by ultrasound with some surrounding tissue thickening. Aspiration yielded grossly purulent fluid. The cavity was largely decompressed but needle aspiration and knee fluid sample sent for culture analysis.  The nearly 11 cm heterogeneous mass in the lateral segment of the left lobe of the liver was sampled, revealing solid tissue. There was considerable bleeding from the trocar needle after core biopsy and Gel-Foam pledgets were instilled as the outer needle was removed. No active bleeding was visualized from the surface of the liver by ultrasound immediately after completion of the biopsy.  The complex fluid collection adjacent to the left lobe of the liver and extending into the peritoneal cavity shows visible enlargement by  ultrasound compared to a prior ultrasound 5 days ago with maximal dimensions of approximately 10 cm. This also appears to communicate with some additional complex fluid in the midline abdomen. Aspiration yielded bloody and turbid fluid which appeared likely infected. For this reason, a percutaneous drain was placed. A 12 French drain was placed over a wire and attached to a suction bulb. Fluid samples from this collection were sent for culture analysis as well as cytology.  IMPRESSION: 1. Aspiration of midline  subcutaneous fluid collection yielded purulent fluid. The small collection was nearly completely decompressed by needle aspiration and a sample of fluid sent for culture analysis. 2. Ultrasound-guided core biopsy was performed of the large left lobe hepatic mass. The percutaneous tract was embolized with Gel-Foam pledgets due to bleeding from the outer needle during the biopsy. There was no immediate evidence of active bleeding from the surface of the liver upon completion of the biopsy. Core samples were sent for histologic analysis. 3. Enlargement of complex fluid collection adjacent to the left lobe of the liver and extending into the peritoneal cavity. Aspiration yielded bloody and turbid fluid which was sent for culture analysis and cytologic analysis. A 12 French percutaneous drain was placed in this collection and attached to a suction bulb. Output will be followed.   Electronically Signed   By: Aletta Edouard M.D.   On: 08/05/2013 13:45   US Aspiration  08/05/2013   CLINICAL DATA:  11 cm left hepatic mass, subcutaneous soft tissue nodule and perihepatic fluid collection by prior CT.  EXAM: 1. ULTRASOUND-GUIDED CORE BIOPSY OF THE LIVER 2. ULTRASOUND-GUIDED ASPIRATION OF SUBCUTANEOUS FLUID COLLECTION 3. ULTRASOUND-GUIDED PERCUTANEOUS CATHETER DRAINAGE OF PERIHEPATIC ABSCESS  MEDICATIONS: 3.0 mg IV Versed; 100 mcg IV Fentanyl  Total Moderate Sedation Time: 24 minutes.  PROCEDURE: The procedure, risks, benefits, and alternatives were explained to the patient. Questions regarding the procedure were encouraged and answered. The patient understands and consents to the procedure.  The abdominal wall was prepped with Betadine in a sterile fashion, and a sterile drape was applied covering the operative field. A sterile gown and sterile gloves were used for the procedure. Local anesthesia was provided with 1% Lidocaine.  The liver, subcutaneous tissues and perihepatic region were first examined by ultrasound. An 18  gauge needle was advanced into the midline subcutaneous abdominal wall fluid collection and aspiration performed. Aspirated fluid sample was sent for culture analysis.  Ultrasound-guided biopsy was performed of a hepatic mass in the left lobe of the liver. A 17 gauge needle was advanced into the lesion. A total of 2 separate 18 gauge core biopsy samples were obtained and submitted in formalin. A slurry of sterile saline and Gel-Foam pledgets was injected through the outer needle as it was retracted out of the liver. Ultrasound was performed immediately post biopsy.  An 18 gauge needle was advanced into a left perihepatic/ subhepatic fluid collection. Aspiration was performed. A fluid sample was sent for culture. A guidewire was advanced into the collection. The tract was dilated and a 12 [place] percutaneous drain advanced into the collection. The drain was connected to a suction bulb. It was secured at the skin with a Prolene retention suture and StatLock device.  COMPLICATIONS: None.  FINDINGS: The midline subcutaneous abnormality superficial to the abdominal wall seen by CT corresponds to a complex fluid collection by ultrasound with some surrounding tissue thickening. Aspiration yielded grossly purulent fluid. The cavity was largely decompressed but needle aspiration and knee fluid sample sent for culture analysis.  The nearly 11 cm heterogeneous mass in the lateral segment of the left lobe of the liver was sampled, revealing solid tissue. There was considerable bleeding from the trocar needle after core biopsy and Gel-Foam pledgets were instilled as the outer needle was removed. No active bleeding was visualized from the surface of the liver by ultrasound immediately after completion of the biopsy.  The complex fluid collection adjacent to the left lobe of the liver and extending into the peritoneal cavity shows visible enlargement by ultrasound compared to a prior ultrasound 5 days ago with maximal dimensions  of approximately 10 cm. This also appears to communicate with some additional complex fluid in the midline abdomen. Aspiration yielded bloody and turbid fluid which appeared likely infected. For this reason, a percutaneous drain was placed. A 12 French drain was placed over a wire and attached to a suction bulb. Fluid samples from this collection were sent for culture analysis as well as cytology.  IMPRESSION: 1. Aspiration of midline subcutaneous fluid collection yielded purulent fluid. The small collection was nearly completely decompressed by needle aspiration and a sample of fluid sent for culture analysis. 2. Ultrasound-guided core biopsy was performed of the large left lobe hepatic mass. The percutaneous tract was embolized with Gel-Foam pledgets due to bleeding from the outer needle during the biopsy. There was no immediate evidence of active bleeding from the surface of the liver upon completion of the biopsy. Core samples were sent for histologic analysis. 3. Enlargement of complex fluid collection adjacent to the left lobe of the liver and extending into the peritoneal cavity. Aspiration yielded bloody and turbid fluid which was sent for culture analysis and cytologic analysis. A 12 French percutaneous drain was placed in this collection and attached to a suction bulb. Output will be followed.   Electronically Signed   By: Aletta Edouard M.D.   On: 08/05/2013 13:45    Anti-infectives: Anti-infectives   Start     Dose/Rate Route Frequency Ordered Stop   08/07/13 0600  vancomycin (VANCOCIN) IVPB 1000 mg/200 mL premix  Status:  Discontinued     1,000 mg 200 mL/hr over 60 Minutes Intravenous Every 12 hours 08/06/13 1802 08/07/13 0918   08/06/13 1815  vancomycin (VANCOCIN) IVPB 1000 mg/200 mL premix     1,000 mg 200 mL/hr over 60 Minutes Intravenous NOW 08/06/13 1802 08/06/13 1945   08/04/13 0500  vancomycin (VANCOCIN) 1,250 mg in sodium chloride 0.9 % 250 mL IVPB  Status:  Discontinued     1,250  mg 166.7 mL/hr over 90 Minutes Intravenous Every 12 hours 08/03/13 1507 08/06/13 1802   08/02/13 1200  levofloxacin (LEVAQUIN) IVPB 500 mg  Status:  Discontinued     500 mg 100 mL/hr over 60 Minutes Intravenous Every 48 hours 07/31/13 1812 07/31/13 2002   08/02/13 1000  ciprofloxacin (CIPRO) IVPB 400 mg  Status:  Discontinued     400 mg 200 mL/hr over 60 Minutes Intravenous Every 12 hours 08/02/13 0911 08/07/13 1002   08/02/13 0400  vancomycin (VANCOCIN) 1,750 mg in sodium chloride 0.9 % 500 mL IVPB  Status:  Discontinued     1,750 mg 250 mL/hr over 120 Minutes Intravenous Every 24 hours 08/02/13 0357 08/03/13 1507   08/01/13 1000  ciprofloxacin (CIPRO) IVPB 400 mg  Status:  Discontinued     400 mg 200 mL/hr over 60 Minutes Intravenous Every 24 hours 08/01/13 0959 08/02/13 0911   07/31/13 2015  vancomycin (VANCOCIN) 2,500 mg in sodium chloride 0.9 % 500 mL  IVPB     2,500 mg 250 mL/hr over 120 Minutes Intravenous  Once 07/31/13 2000 07/31/13 2346   07/31/13 2000  metroNIDAZOLE (FLAGYL) IVPB 500 mg  Status:  Discontinued     500 mg 100 mL/hr over 60 Minutes Intravenous Every 8 hours 07/31/13 1812 08/07/13 1005   07/31/13 1830  aztreonam (AZACTAM) 1 g in dextrose 5 % 50 mL IVPB  Status:  Discontinued     1 g 100 mL/hr over 30 Minutes Intravenous Every 8 hours 07/31/13 1815 08/01/13 0958   07/31/13 1130  levofloxacin (LEVAQUIN) IVPB 750 mg     750 mg 100 mL/hr over 90 Minutes Intravenous  Once 07/31/13 1123 07/31/13 1310   07/31/13 1130  metroNIDAZOLE (FLAGYL) IVPB 500 mg     500 mg 100 mL/hr over 60 Minutes Intravenous  Once 07/31/13 1123 07/31/13 1310      Assessment/Plan: s/p * No surgery found *  Perihep abscess drain intact Doing well Will follow Will need Re CT when output is less than 10-15cc/day   LOS: 7 days    Lavonia Drafts 08/07/2013

## 2013-08-07 NOTE — Progress Notes (Addendum)
Pt with elevated heart rate to 160's. 12 lead EKG obtained per MD Redmond Pulling. Pt was laying quitely in the bed at time of incident. Pt sustained heart rate for approximately 5 minutes before self-converting back to NSR/ST. Will continue to monitor.

## 2013-08-07 NOTE — Consult Note (Signed)
[facility]    Date of Admission:  07/31/2013  Date of Consult:  08/07/2013  Reason for Consult: Liver abscess  Referring Physician: Dr. Titus Mould   HPI: Leah Peterson is an 58 y.o. female with a PMH of diverticulitis (last treatment one year ago)HTN, IBS, and obesity who presents with abdominal pain and diarrhea noted to be hypotensive in ED with BP 80's/60's and mildly elevated lactic acid at 3.27. Abd CT showed diverticulitis and free fluid collection with largest one 9.3 cm at left lobe of liver. Went for [place] bx of liver mass 4/6, returned and was hypotensive with fevers/chills. She was treated with very broad spectrum antibiotics. GS from liver abscess yielded GPC in pairs. She has been on vanco, cipro and flagyl.     Past Medical History  Diagnosis Date  . Diverticulosis of colon (without mention of hemorrhage)   . Pure hypercholesterolemia   . Other abnormal glucose   . Essential hypertension, benign   . IBS (irritable bowel syndrome)   . Obesity, unspecified   . Unspecified vitamin D deficiency   . Left knee pain 2012    eval by ortho - bone spurs and DJD, treated with steroid shot which helped temporarily    Past Surgical History  Procedure Laterality Date  . Cesarean section      due to BP elevation  . Colonoscopy  2000    Diverticuli-Dr. Sharlett Iles  ergies:   Allergies  Allergen Reactions  . Lisinopril     REACTION: lethargy, cough.  . Penicillins     REACTION: LYMPH NODES SWELL     Medications: I have reviewed patients current medications as documented in Epic Anti-infectives   Start     Dose/Rate Route Frequency Ordered Stop   08/07/13 1300  metroNIDAZOLE (FLAGYL) IVPB 500 mg     500 mg 100 mL/hr over 60 Minutes Intravenous Every 8 hours 08/07/13 1201     08/07/13 1215  ciprofloxacin (CIPRO) IVPB 400 mg     400 mg 200 mL/hr over 60 Minutes Intravenous Every 12 hours 08/07/13 1201     08/07/13 1200  imipenem-cilastatin  (PRIMAXIN) 500 mg in sodium chloride 0.9 % 100 mL IVPB  Status:  Discontinued     500 mg 200 mL/hr over 30 Minutes Intravenous 4 times per day 08/07/13 1132 08/07/13 1157   08/07/13 0600  vancomycin (VANCOCIN) IVPB 1000 mg/200 mL premix  Status:  Discontinued     1,000 mg 200 mL/hr over 60 Minutes Intravenous Every 12 hours 08/06/13 1802 08/07/13 0918   08/06/13 1815  vancomycin (VANCOCIN) IVPB 1000 mg/200 mL premix     1,000 mg 200 mL/hr over 60 Minutes Intravenous NOW 08/06/13 1802 08/06/13 1945   08/04/13 0500  vancomycin (VANCOCIN) 1,250 mg in sodium chloride 0.9 % 250 mL IVPB  Status:  Discontinued     1,250 mg 166.7 mL/hr over 90 Minutes Intravenous Every 12 hours 08/03/13 1507 08/06/13 1802   08/02/13 1200  levofloxacin (LEVAQUIN) IVPB 500 mg  Status:  Discontinued     500 mg 100 mL/hr over 60 Minutes Intravenous Every 48 hours 07/31/13 1812 07/31/13 2002   08/02/13 1000  ciprofloxacin (CIPRO) IVPB 400 mg  Status:  Discontinued     400 mg 200 mL/hr over 60 Minutes Intravenous Every 12 hours 08/02/13 0911 08/07/13 1002   08/02/13 0400  vancomycin (VANCOCIN) 1,750 mg in sodium chloride 0.9 % 500 mL IVPB  Status:  Discontinued  1,750 mg 250 mL/hr over 120 Minutes Intravenous Every 24 hours 08/02/13 0357 08/03/13 1507   08/01/13 1000  ciprofloxacin (CIPRO) IVPB 400 mg  Status:  Discontinued     400 mg 200 mL/hr over 60 Minutes Intravenous Every 24 hours 08/01/13 0959 08/02/13 0911   07/31/13 2015  vancomycin (VANCOCIN) 2,500 mg in sodium chloride 0.9 % 500 mL IVPB     2,500 mg 250 mL/hr over 120 Minutes Intravenous  Once 07/31/13 2000 07/31/13 2346   07/31/13 2000  metroNIDAZOLE (FLAGYL) IVPB 500 mg  Status:  Discontinued     500 mg 100 mL/hr over 60 Minutes Intravenous Every 8 hours 07/31/13 1812 08/07/13 1005   07/31/13 1830  aztreonam (AZACTAM) 1 g in dextrose 5 % 50 mL IVPB  Status:  Discontinued     1 g 100 mL/hr over 30 Minutes Intravenous Every 8 hours 07/31/13 1815  08/01/13 0958   07/31/13 1130  levofloxacin (LEVAQUIN) IVPB 750 mg     750 mg 100 mL/hr over 90 Minutes Intravenous  Once 07/31/13 1123 07/31/13 1310   07/31/13 1130  metroNIDAZOLE (FLAGYL) IVPB 500 mg     500 mg 100 mL/hr over 60 Minutes Intravenous  Once 07/31/13 1123 07/31/13 1310      Social History:  reports that she has quit smoking. She has never used smokeless tobacco. She reports that she drinks alcohol. She reports that she does not use illicit drugs.  Family History  Problem Relation Age of Onset  . Hypertension Father   . Cancer Father     prostate  . Heart disease Father     CABG x 2  . Cancer Mother     Lung-quit smoking 20 years prior to Dx  . Hypertension Mother   . Diabetes      + family hx  . Drug abuse Brother   . Stroke Maternal Grandmother   . Sleep apnea Brother     with [facility] estranged    As in HPI and primary teams notes otherwise 12 point review of systems is negative  Blood pressure 129/78, pulse 84, temperature 98.7 F (37.1 C), temperature source Oral, resp. rate 41, height $RemoveBe'5\' 1"'xTYwCRxMl$  (1.549 m), weight 309 lb 15.5 oz (140.6 kg), SpO2 98.00%. General: Alert and awake, oriented x3, not in any acute distress. obese HEENT: anicteric sclera, pupils reactive to light and accommodation, EOMI, oropharynx clear and without exudate CVS regular rate, normal r,  no murmur rubs or gallops Chest: clear to auscultation bilaterally, no wheezing, rales or rhonchi Abdomen:ttp, diffusely, Drain with serosanguinous fluid and particulate matter Extremities: + edema Skin: no rashes Neuro: nonfocal, strength and sensation intact   Results for orders placed during the hospital encounter of 07/31/13 (from the past 48 hour(s))  TYPE AND SCREEN     Status: None   Collection Time    08/05/13  3:50 PM      Result Value Ref Range   ABO/RH(D) B POS     Antibody Screen NEG     Sample Expiration 08/08/2013     Unit Number [id number]     Blood Component Type RED CELLS,LR      Unit division 00     Status of Unit ISSUED,FINAL     Transfusion Status OK TO TRANSFUSE     Crossmatch Result Compatible     Unit Number [id number]     Blood Component Type RED CELLS,LR     Unit division 00     Status of Unit ISSUED,FINAL  Transfusion Status OK TO TRANSFUSE     Crossmatch Result Compatible     Unit Number [id number]     Blood Component Type RED CELLS,LR     Unit division 00     Status of Unit ALLOCATED     Transfusion Status OK TO TRANSFUSE     Crossmatch Result Compatible    HEMOGLOBIN AND HEMATOCRIT, BLOOD     Status: Abnormal   Collection Time    08/05/13  5:00 PM      Result Value Ref Range   Hemoglobin 8.5 (*) 12.0 - 15.0 g/dL   HCT 25.6 (*) 36.0 - 46.0 %  GLUCOSE, CAPILLARY     Status: Abnormal   Collection Time    08/05/13  5:08 PM      Result Value Ref Range   Glucose-Capillary 114 (*) 70 - 99 mg/dL  MRSA PCR SCREENING     Status: None   Collection Time    08/05/13  5:54 PM      Result Value Ref Range   MRSA by PCR NEGATIVE  NEGATIVE   Comment:            The GeneXpert MRSA Assay (FDA     approved for NASAL specimens     only), is one component of a     comprehensive MRSA colonization     surveillance program. It is not     intended to diagnose MRSA     infection nor to guide or     monitor treatment for     MRSA infections.  PREPARE RBC (CROSSMATCH)     Status: None   Collection Time    08/05/13  6:30 PM      Result Value Ref Range   Order Confirmation ORDER PROCESSED BY BLOOD BANK    PROTIME-INR     Status: Abnormal   Collection Time    08/05/13  8:13 PM      Result Value Ref Range   Prothrombin Time 19.6 (*) 11.6 - 15.2 seconds   INR 1.71 (*) 0.00 - 1.49  HEMOGLOBIN AND HEMATOCRIT, BLOOD     Status: Abnormal   Collection Time    08/05/13  8:14 PM      Result Value Ref Range   Hemoglobin 9.1 (*) 12.0 - 15.0 g/dL   HCT 27.1 (*) 36.0 - 46.0 %  PREPARE FRESH FROZEN PLASMA     Status: None   Collection Time    08/05/13  10:00 PM      Result Value Ref Range   Unit Number [id number]     Blood Component Type THAWED PLASMA     Unit division 00     Status of Unit ISSUED,FINAL     Transfusion Status OK TO TRANSFUSE     Unit Number [id number]     Blood Component Type THAWED PLASMA     Unit division 00     Status of Unit ISSUED,FINAL     Transfusion Status OK TO TRANSFUSE    CBC WITH DIFFERENTIAL     Status: Abnormal   Collection Time    08/06/13  2:40 AM      Result Value Ref Range   WBC 25.5 (*) 4.0 - 10.5 K/uL   RBC 3.03 (*) 3.87 - 5.11 MIL/uL   Hemoglobin 8.0 (*) 12.0 - 15.0 g/dL   HCT 23.8 (*) 36.0 - 46.0 %   MCV 78.5  78.0 - 100.0 fL   MCH 26.4  26.0 - 34.0 pg  MCHC 33.6  30.0 - 36.0 g/dL   RDW 15.7 (*) 11.5 - 15.5 %   Platelets 176  150 - 400 K/uL   Neutrophils Relative % 84 (*) 43 - 77 %   Lymphocytes Relative 9 (*) 12 - 46 %   Monocytes Relative 5  3 - 12 %   Eosinophils Relative 1  0 - 5 %   Basophils Relative 1  0 - 1 %   Neutro Abs 21.3 (*) 1.7 - 7.7 K/uL   Lymphs Abs 2.3  0.7 - 4.0 K/uL   Monocytes Absolute 1.3 (*) 0.1 - 1.0 K/uL   Eosinophils Absolute 0.3  0.0 - 0.7 K/uL   Basophils Absolute 0.3 (*) 0.0 - 0.1 K/uL   WBC Morphology MILD LEFT SHIFT (1-5% METAS, OCC MYELO, OCC BANDS)     Comment: TOXIC GRANULATION  COMPREHENSIVE METABOLIC PANEL     Status: Abnormal   Collection Time    08/06/13  2:40 AM      Result Value Ref Range   Sodium 142  137 - 147 mEq/L   Potassium 3.7  3.7 - 5.3 mEq/L   Chloride 105  96 - 112 mEq/L   CO2 24  19 - 32 mEq/L   Glucose, Bld 112 (*) 70 - 99 mg/dL   BUN 24 (*) 6 - 23 mg/dL   Creatinine, Ser 1.19 (*) 0.50 - 1.10 mg/dL   Calcium 7.2 (*) 8.4 - 10.5 mg/dL   Total Protein 5.4 (*) 6.0 - 8.3 g/dL   Albumin 1.8 (*) 3.5 - 5.2 g/dL   AST 84 (*) 0 - 37 U/L   ALT 48 (*) 0 - 35 U/L   Alkaline Phosphatase 78  39 - 117 U/L   Total Bilirubin 1.0  0.3 - 1.2 mg/dL   GFR calc non Af Amer 50 (*) >90 mL/min   GFR calc Af Amer 58 (*) >90 mL/min    Comment: (NOTE)     The eGFR has been calculated using the CKD EPI equation.     This calculation has not been validated in all clinical situations.     eGFR's persistently <90 mL/min signify possible Chronic Kidney     Disease.  MAGNESIUM     Status: None   Collection Time    08/06/13  2:40 AM      Result Value Ref Range   Magnesium 1.8  1.5 - 2.5 mg/dL  PHOSPHORUS     Status: None   Collection Time    08/06/13  2:40 AM      Result Value Ref Range   Phosphorus 4.0  2.3 - 4.6 mg/dL  PROTIME-INR     Status: Abnormal   Collection Time    08/06/13  2:40 AM      Result Value Ref Range   Prothrombin Time 18.2 (*) 11.6 - 15.2 seconds   INR 1.55 (*) 0.00 - 1.49  PREPARE RBC (CROSSMATCH)     Status: None   Collection Time    08/06/13  3:44 AM      Result Value Ref Range   Order Confirmation ORDER PROCESSED BY BLOOD BANK    HEMOGLOBIN AND HEMATOCRIT, BLOOD     Status: Abnormal   Collection Time    08/06/13  4:30 AM      Result Value Ref Range   Hemoglobin 7.7 (*) 12.0 - 15.0 g/dL   HCT 23.3 (*) 36.0 - 46.0 %  HEMOGLOBIN AND HEMATOCRIT, BLOOD     Status: Abnormal  Collection Time    08/06/13  8:00 AM      Result Value Ref Range   Hemoglobin 8.3 (*) 12.0 - 15.0 g/dL   HCT 24.7 (*) 36.0 - 46.0 %  HEMOGLOBIN AND HEMATOCRIT, BLOOD     Status: Abnormal   Collection Time    08/06/13 11:00 AM      Result Value Ref Range   Hemoglobin 8.6 (*) 12.0 - 15.0 g/dL   HCT 25.2 (*) 36.0 - 46.0 %  CBC     Status: Abnormal   Collection Time    08/06/13  2:13 PM      Result Value Ref Range   WBC 23.9 (*) 4.0 - 10.5 K/uL   RBC 3.16 (*) 3.87 - 5.11 MIL/uL   Hemoglobin 8.3 (*) 12.0 - 15.0 g/dL   HCT 24.9 (*) 36.0 - 46.0 %   MCV 78.8  78.0 - 100.0 fL   MCH 26.3  26.0 - 34.0 pg   MCHC 33.3  30.0 - 36.0 g/dL   RDW 15.7 (*) 11.5 - 15.5 %   Platelets 178  150 - 400 K/uL  VANCOMYCIN, TROUGH     Status: Abnormal   Collection Time    08/06/13  3:50 PM      Result Value Ref Range   Vancomycin Tr  20.9 (*) 10.0 - 20.0 ug/mL  BASIC METABOLIC PANEL     Status: Abnormal   Collection Time    08/06/13  5:03 PM      Result Value Ref Range   Sodium 143  137 - 147 mEq/L   Potassium 3.6 (*) 3.7 - 5.3 mEq/L   Chloride 107  96 - 112 mEq/L   CO2 23  19 - 32 mEq/L   Glucose, Bld 98  70 - 99 mg/dL   BUN 24 (*) 6 - 23 mg/dL   Creatinine, Ser 1.17 (*) 0.50 - 1.10 mg/dL   Calcium 7.8 (*) 8.4 - 10.5 mg/dL   GFR calc non Af Amer 51 (*) >90 mL/min   GFR calc Af Amer 60 (*) >90 mL/min   Comment: (NOTE)     The eGFR has been calculated using the CKD EPI equation.     This calculation has not been validated in all clinical situations.     eGFR's persistently <90 mL/min signify possible Chronic Kidney     Disease.  MAGNESIUM     Status: None   Collection Time    08/06/13  5:03 PM      Result Value Ref Range   Magnesium 1.9  1.5 - 2.5 mg/dL  PHOSPHORUS     Status: None   Collection Time    08/06/13  5:03 PM      Result Value Ref Range   Phosphorus 2.9  2.3 - 4.6 mg/dL  CBC     Status: Abnormal   Collection Time    08/06/13  5:03 PM      Result Value Ref Range   WBC 25.0 (*) 4.0 - 10.5 K/uL   RBC 3.18 (*) 3.87 - 5.11 MIL/uL   Hemoglobin 8.4 (*) 12.0 - 15.0 g/dL   HCT 25.1 (*) 36.0 - 46.0 %   MCV 78.9  78.0 - 100.0 fL   MCH 26.4  26.0 - 34.0 pg   MCHC 33.5  30.0 - 36.0 g/dL   RDW 15.8 (*) 11.5 - 15.5 %   Platelets 184  150 - 400 K/uL  CBC     Status: Abnormal  Collection Time    08/07/13 12:08 AM      Result Value Ref Range   WBC 24.3 (*) 4.0 - 10.5 K/uL   RBC 3.13 (*) 3.87 - 5.11 MIL/uL   Hemoglobin 8.3 (*) 12.0 - 15.0 g/dL   HCT 24.6 (*) 36.0 - 46.0 %   MCV 78.6  78.0 - 100.0 fL   MCH 26.5  26.0 - 34.0 pg   MCHC 33.7  30.0 - 36.0 g/dL   RDW 15.9 (*) 11.5 - 15.5 %   Platelets 178  150 - 400 K/uL  BASIC METABOLIC PANEL     Status: Abnormal   Collection Time    08/07/13  4:10 AM      Result Value Ref Range   Sodium 142  137 - 147 mEq/L   Potassium 3.6 (*) 3.7 - 5.3 mEq/L    Chloride 107  96 - 112 mEq/L   CO2 23  19 - 32 mEq/L   Glucose, Bld 118 (*) 70 - 99 mg/dL   BUN 21  6 - 23 mg/dL   Creatinine, Ser 1.14 (*) 0.50 - 1.10 mg/dL   Calcium 7.7 (*) 8.4 - 10.5 mg/dL   GFR calc non Af Amer 53 (*) >90 mL/min   GFR calc Af Amer 62 (*) >90 mL/min   Comment: (NOTE)     The eGFR has been calculated using the CKD EPI equation.     This calculation has not been validated in all clinical situations.     eGFR's persistently <90 mL/min signify possible Chronic Kidney     Disease.  MAGNESIUM     Status: None   Collection Time    08/07/13  4:10 AM      Result Value Ref Range   Magnesium 1.7  1.5 - 2.5 mg/dL  TROPONIN I     Status: None   Collection Time    08/07/13  4:10 AM      Result Value Ref Range   Troponin I <0.30  <0.30 ng/mL   Comment:            Due to the release kinetics of cTnI,     a negative result within the first hours     of the onset of symptoms does not rule out     myocardial infarction with certainty.     If myocardial infarction is still suspected,     repeat the test at appropriate intervals.  CBC     Status: Abnormal   Collection Time    08/07/13  7:29 AM      Result Value Ref Range   WBC 25.6 (*) 4.0 - 10.5 K/uL   Comment: REPEATED TO VERIFY     CONSISTENT WITH PREVIOUS RESULT   RBC 3.03 (*) 3.87 - 5.11 MIL/uL   Hemoglobin 8.0 (*) 12.0 - 15.0 g/dL   HCT 23.9 (*) 36.0 - 46.0 %   MCV 78.9  78.0 - 100.0 fL   MCH 26.4  26.0 - 34.0 pg   MCHC 33.5  30.0 - 36.0 g/dL   RDW 15.9 (*) 11.5 - 15.5 %   Platelets 201  150 - 400 K/uL  TSH     Status: None   Collection Time    08/07/13  9:00 AM      Result Value Ref Range   TSH 4.460  0.350 - 4.500 uIU/mL   Comment: Please note change in reference range.  TROPONIN I     Status: None  Collection Time    08/07/13 10:10 AM      Result Value Ref Range   Troponin I <0.30  <0.30 ng/mL   Comment:            Due to the release kinetics of cTnI,     a negative result within the first hours      of the onset of symptoms does not rule out     myocardial infarction with certainty.     If myocardial infarction is still suspected,     repeat the test at appropriate intervals.      Component Value Date/Time   SDES ABSCESS 08/05/2013 1232   SDES ABSCESS 08/05/2013 1232   SDES ABSCESS 08/05/2013 1232   SDES ABSCESS 08/05/2013 1232   SPECREQUEST ASPIRATION OF SUBCUTANEOUS FLUID COLLECTION 08/05/2013 1232   SPECREQUEST ASPIRATE OF PERIHEPATIC COLLECTION 08/05/2013 1232   SPECREQUEST ASPIRATE OF PERIHEPATIC COLLECTION 08/05/2013 1232   SPECREQUEST ASPIRATE OF SUBUTANEOUS FLUID COLLECTION 08/05/2013 1232   CULT  Value: NO GROWTH 2 DAYS Performed at [facility] 08/05/2013 1232   CULT  Value: NO GROWTH 2 DAYS Performed at [facility] 08/05/2013 1232   CULT  Value: NO ANAEROBES ISOLATED; CULTURE IN PROGRESS FOR 5 DAYS Performed at [facility] 08/05/2013 1232   CULT  Value: NO ANAEROBES ISOLATED; CULTURE IN PROGRESS FOR 5 DAYS Performed at [place] 08/05/2013 1232   REPTSTATUS PENDING 08/05/2013 1232   REPTSTATUS PENDING 08/05/2013 1232   REPTSTATUS PENDING 08/05/2013 1232   REPTSTATUS PENDING 08/05/2013 1232   Ct Abdomen Pelvis Wo Contrast  08/05/2013   CLINICAL DATA:  Evaluate intra-abdominal hemorrhage. Angiogram this morning. Known liver mass.  EXAM: CT ABDOMEN AND PELVIS WITHOUT CONTRAST  TECHNIQUE: Multidetector CT imaging of the abdomen and pelvis was performed following the standard protocol without intravenous contrast.  COMPARISON:  US BIOPSY dated 08/05/2013; CT ABD/PELV WO CM dated 08/01/2013; CT ABD/PELV WO CM dated 07/31/2013  FINDINGS: Small bilateral pleural effusions with bilateral lower lobe atelectasis.  There is a pigtail drainage catheter in the midline of the upper abdomen, extending inferior to the right hepatic lobe. The previously seen sub hepatic fluid collection is being drained by the pigtail drainage catheter and collection is now decompressed. There is gas within the  left hepatic lobe mass. Left hepatic lobe mass measures 10 cm AP x 13.8 cm transverse. This is larger than on the prior examination and there is some increased attenuation within the mass, some of which represents Gel-Foam and some of which represents hemorrhage associated with instrumentation earlier today. There is an anterior left hepatic lobe subcapsular hematoma which is difficult to clearly define but measures 3.3 cm in thickness with layering hematocrit level.  In the anatomic pelvis, there is an air-fluid level which appears distinct from the collapsed urinary bladder which has a Foley catheter. This may be within the deep pelvic abscess anterior to the uterus however the pelvis is poorly visualized in the absence of IV contrast. Hemo peritoneum is present tracking along the left abdominal wall, outlining the pericolic gutter.  Vicarious excretion of contrast is present in the gallbladder. Kidneys and adrenals appear within normal limits. There is no bowel obstruction. Fluid collection in the anterior abdominal wall appears slightly smaller compatible with needle aspiration. Foley catheter present urinary bladder. 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  IMPRESSION: 1. Interval placement of pigtail drainage catheter in the subhepatic region inferior to the left hepatic lobe. 2. Small amount of  bleeding into the left hepatic lobe mass. New gas within the mass is compatible with Gel-Foam from procedure or earlier today. 3. Small anterior left hepatic lobe subcapsular hematoma with hematocrit level. 4. Moderate hemoperitoneum is new. 5. Probable abscess in the anterior anatomic pelvis superior to the dome of the urinary bladder (image 76 series 2).   Electronically Signed   By: Dereck Ligas M.D.   On: 08/05/2013 17:16     Recent Results (from the past 720 hour(s))  CULTURE, BLOOD (ROUTINE X 2)     Status: None   Collection Time    07/31/13 11:20 AM      Result Value Ref Range  Status   Specimen Description BLOOD RIGHT ANTECUBITAL   Final   Special Requests BOTTLES DRAWN AEROBIC AND ANAEROBIC 5CC   Final   Culture  Setup Time     Final   Value: 07/31/2013 16:19     Performed at [facility]   Culture     Final   Value: STAPHYLOCOCCUS SPECIES (COAGULASE NEGATIVE)     Note: THE SIGNIFICANCE OF ISOLATING THIS ORGANISM FROM A SINGLE SET OF BLOOD CULTURES WHEN MULTIPLE SETS ARE DRAWN IS UNCERTAIN. PLEASE NOTIFY THE MICROBIOLOGY DEPARTMENT WITHIN ONE WEEK IF SPECIATION AND SENSITIVITIES ARE REQUIRED.     MICROCOCCUS SPECIES     Note: Standardized susceptibility testing for this organism is not available.     Note: Gram Stain Report Called to,Read Back By and Verified With: RACHEL [id number]  ON [id number] BY [facility]   Report Status 08/05/2013 FINAL   Final  CULTURE, BLOOD (ROUTINE X 2)     Status: None   Collection Time    07/31/13 11:25 AM      Result Value Ref Range Status   Specimen Description BLOOD RIGHT ARM UPPER   Final   Special Requests BOTTLES DRAWN AEROBIC AND ANAEROBIC 5CC   Final   Culture  Setup Time     Final   Value: 07/31/2013 14:25     Performed at [facility]   Culture     Final   Value: DIPHTHEROIDS(CORYNEBACTERIUM SPECIES)     Note: Standardized susceptibility testing for this organism is not available.     Note: Gram Stain Report Called to,Read Back By and Verified With: MONICA CROSS ON 08/01/2013 AT 9:35P BY WILEJ     Performed at [facility]   Report Status 08/03/2013 FINAL   Final  URINE CULTURE     Status: None   Collection Time    07/31/13 12:19 PM      Result Value Ref Range Status   Specimen Description URINE, CLEAN CATCH   Final   Special Requests NONE   Final   Culture  Setup Time     Final   Value: 07/31/2013 12:54     Performed at [place]     Final   Value: NO GROWTH     Performed at [facility]   Culture     Final   Value: NO GROWTH     Performed at [facility]     Report Status 08/01/2013 FINAL   Final  MRSA PCR SCREENING     Status: None   Collection Time    07/31/13  9:02 PM      Result Value Ref Range Status   MRSA by PCR NEGATIVE  NEGATIVE Final   Comment:  The GeneXpert MRSA Assay (FDA     approved for NASAL specimens     only), is one component of a     comprehensive MRSA colonization     surveillance program. It is not     intended to diagnose MRSA     infection nor to guide or     monitor treatment for     MRSA infections.  URINE CULTURE     Status: None   Collection Time    08/05/13  9:05 AM      Result Value Ref Range Status   Specimen Description URINE, CATHETERIZED   Final   Special Requests Normal   Final   Culture  Setup Time     Final   Value: 08/05/2013 10:47     Performed at [place]     Final   Value: NO GROWTH     Performed at [facility]   Culture     Final   Value: NO GROWTH     Performed at [facility]   Report Status 08/06/2013 FINAL   Final  CULTURE, ROUTINE-ABSCESS     Status: None   Collection Time    08/05/13 12:32 PM      Result Value Ref Range Status   Specimen Description ABSCESS   Final   Special Requests ASPIRATION OF SUBCUTANEOUS FLUID COLLECTION   Final   Gram Stain     Final   Value: ABUNDANT WBC PRESENT,BOTH PMN AND MONONUCLEAR     NO SQUAMOUS EPITHELIAL CELLS SEEN     FEW GRAM POSITIVE COCCI     IN PAIRS     Performed at [facility]   Culture     Final   Value: NO GROWTH 2 DAYS     Performed at [facility]   Report Status PENDING   Incomplete  CULTURE, ROUTINE-ABSCESS     Status: None   Collection Time    08/05/13 12:32 PM      Result Value Ref Range Status   Specimen Description ABSCESS   Final   Special Requests ASPIRATE OF PERIHEPATIC COLLECTION   Final   Gram Stain     Final   Value: ABUNDANT WBC PRESENT,BOTH PMN AND MONONUCLEAR     NO SQUAMOUS EPITHELIAL CELLS SEEN     RARE GRAM POSITIVE COCCI     IN  PAIRS     Performed at [facility]   Culture     Final   Value: NO GROWTH 2 DAYS     Performed at [facility]   Report Status PENDING   Incomplete  ANAEROBIC CULTURE     Status: None   Collection Time    08/05/13 12:32 PM      Result Value Ref Range Status   Specimen Description ABSCESS   Final   Special Requests ASPIRATE OF PERIHEPATIC COLLECTION   Final   Gram Stain     Final   Value: ABUNDANT WBC PRESENT,BOTH PMN AND MONONUCLEAR     NO SQUAMOUS EPITHELIAL CELLS SEEN     RARE GRAM POSITIVE COCCI     IN PAIRS     Performed at [facility]   Culture     Final   Value: NO ANAEROBES ISOLATED; CULTURE IN PROGRESS FOR 5 DAYS     Performed at [facility]   Report Status PENDING   Incomplete  ANAEROBIC CULTURE     Status:  None   Collection Time    08/05/13 12:32 PM      Result Value Ref Range Status   Specimen Description ABSCESS   Final   Special Requests ASPIRATE OF SUBUTANEOUS FLUID COLLECTION   Final   Gram Stain     Final   Value: ABUNDANT WBC PRESENT,BOTH PMN AND MONONUCLEAR     NO SQUAMOUS EPITHELIAL CELLS SEEN     FEW GRAM POSITIVE COCCI     IN PAIRS     Performed at [facility]   Culture     Final   Value: NO ANAEROBES ISOLATED; CULTURE IN PROGRESS FOR 5 DAYS     Performed at [facility]   Report Status PENDING   Incomplete  MRSA PCR SCREENING     Status: None   Collection Time    08/05/13  5:54 PM      Result Value Ref Range Status   MRSA by PCR NEGATIVE  NEGATIVE Final   Comment:            The GeneXpert MRSA Assay (FDA     approved for NASAL specimens     only), is one component of a     comprehensive MRSA colonization     surveillance program. It is not     intended to diagnose MRSA     infection nor to guide or     monitor treatment for     MRSA infections.     Impression/Recommendation  Principal Problem:   Severe sepsis with septic shock Active Problems:   HYPERTENSION, BENIGN ESSENTIAL    Diet-controlled type 2 diabetes mellitus   Intraabdominal fluid collection   Abscess of abdominal cavity   Acute renal failure   Diverticulitis   Liver abscess   Sepsis(995.91)   SBO (small bowel obstruction)   Leah Peterson is a 58 y.o. female with  Liver abscess likely with Enterococcus vs Strep species +/- anerobe  #1 Liver abscess:  --change to Vancomycin (in case AMP R Enterococcus) And --IMIPENEM (given PCN allergy) for AMP sensitive enterococcus anerobes etc  --she will need protracted course with followup CT proving resolution of her abscess prior to dc her IV abx  #2 Screening: check HIV and Hep C if not already done  #3 Found to have DVT: primary team alerted by tech   08/07/2013, 3:20 PM   Thank you so much for this interesting consult  [place] [facility] [place] [phone] (pager) [phone] (office) 08/07/2013, 3:20 PM  [place] 08/07/2013, 3:20 PM

## 2013-08-07 NOTE — Progress Notes (Signed)
Patient continues to improve.  Can come out of the ICU.  ID consultation.  Leah Peterson. Dahlia Bailiff, MD, [place] [phone] [facility]

## 2013-08-07 NOTE — Progress Notes (Signed)
VASCULAR LAB PRELIMINARY  PRELIMINARY  PRELIMINARY  PRELIMINARY  Bilateral lower extremity venous duplex  completed.    Preliminary report:  Right:  DVT noted in the PTV.  No evidence of superficial thrombosis.  No Baker's cyst.  Left: DVT noted in the PTV.  No evidence of superficial thrombosis.  No Baker's cyst.   Nani Ravens, RVT 08/07/2013, 2:55 PM

## 2013-08-07 NOTE — Progress Notes (Signed)
ANTIBIOTIC CONSULT NOTE - INITIAL  Pharmacy Consult for Primaxin and Vancomycin Indication: Intra-abdominal infection  Allergies  Allergen Reactions  . Lisinopril     REACTION: lethargy, cough.  . Penicillins     REACTION: LYMPH NODES SWELL    Patient Measurements: Height: 5\' 1"  (154.9 cm) Weight: 309 lb 15.5 oz (140.6 kg) IBW/kg (Calculated) : 47.8  Vital Signs: Temp: 98.7 F (37.1 C) (04/08 0852) Temp src: Oral (04/08 0852) BP: 129/78 mmHg (04/08 1400) Pulse Rate: 84 (04/08 1400) Intake/Output from previous day: 04/07 0701 - 04/08 0700 In: 3480.4 [I.V.:2360.4; IV Piggyback:1100] Out: 554 [Urine:480; Drains:74] Intake/Output from this shift: Total I/O In: 492 [I.V.:132; Other:10; IV Piggyback:350] Out: 25 [Drains:25]  Labs:  Recent Labs  08/06/13 0240  08/06/13 1703 08/07/13 0008 08/07/13 0410 08/07/13 0729  WBC 25.5*  < > 25.0* 24.3*  --  25.6*  HGB 8.0*  < > 8.4* 8.3*  --  8.0*  PLT 176  < > 184 178  --  201  CREATININE 1.19*  --  1.17*  --  1.14*  --   < > = values in this interval not displayed. Estimated Creatinine Clearance: 74.7 ml/min (by C-G formula based on Cr of 1.14).  Recent Labs  08/06/13 1550  [place] 20.9*     Microbiology: Recent Results (from the past 720 hour(s))  CULTURE, BLOOD (ROUTINE X 2)     Status: None   Collection Time    07/31/13 11:20 AM      Result Value Ref Range Status   Specimen Description BLOOD RIGHT ANTECUBITAL   Final   Special Requests BOTTLES DRAWN AEROBIC AND ANAEROBIC 5CC   Final   Culture  Setup Time     Final   Value: 07/31/2013 16:19     Performed at [facility]   Culture     Final   Value: STAPHYLOCOCCUS SPECIES (COAGULASE NEGATIVE)     Note: THE SIGNIFICANCE OF ISOLATING THIS ORGANISM FROM A SINGLE SET OF BLOOD CULTURES WHEN MULTIPLE SETS ARE DRAWN IS UNCERTAIN. PLEASE NOTIFY THE MICROBIOLOGY DEPARTMENT WITHIN ONE WEEK IF SPECIATION AND SENSITIVITIES ARE REQUIRED.     MICROCOCCUS SPECIES      Note: Standardized susceptibility testing for this organism is not available.     Note: Gram Stain Report Called to,Read Back By and Verified With: RACHEL [email or web address]  ON [id number] BY [facility]   Report Status 08/05/2013 FINAL   Final  CULTURE, BLOOD (ROUTINE X 2)     Status: None   Collection Time    07/31/13 11:25 AM      Result Value Ref Range Status   Specimen Description BLOOD RIGHT ARM UPPER   Final   Special Requests BOTTLES DRAWN AEROBIC AND ANAEROBIC 5CC   Final   Culture  Setup Time     Final   Value: 07/31/2013 14:25     Performed at [facility]   Culture     Final   Value: DIPHTHEROIDS(CORYNEBACTERIUM SPECIES)     Note: Standardized susceptibility testing for this organism is not available.     Note: Gram Stain Report Called to,Read Back By and Verified With: MONICA CROSS ON 08/01/2013 AT 9:35P BY Dennard Nip     Performed at [facility]   Report Status 08/03/2013 FINAL   Final  URINE CULTURE     Status: None   Collection Time    07/31/13 12:19 PM      Result Value Ref Range Status   Specimen Description URINE, CLEAN  CATCH   Final   Special Requests NONE   Final   Culture  Setup Time     Final   Value: 07/31/2013 12:54     Performed at [place]     Final   Value: NO GROWTH     Performed at [facility]   Culture     Final   Value: NO GROWTH     Performed at [facility]   Report Status 08/01/2013 FINAL   Final  MRSA PCR SCREENING     Status: None   Collection Time    07/31/13  9:02 PM      Result Value Ref Range Status   MRSA by PCR NEGATIVE  NEGATIVE Final   Comment:            The GeneXpert MRSA Assay (FDA     approved for NASAL specimens     only), is one component of a     comprehensive MRSA colonization     surveillance program. It is not     intended to diagnose MRSA     infection nor to guide or     monitor treatment for     MRSA infections.  URINE CULTURE     Status: None   Collection Time    08/05/13   9:05 AM      Result Value Ref Range Status   Specimen Description URINE, CATHETERIZED   Final   Special Requests Normal   Final   Culture  Setup Time     Final   Value: 08/05/2013 10:47     Performed at [place]     Final   Value: NO GROWTH     Performed at [facility]   Culture     Final   Value: NO GROWTH     Performed at [facility]   Report Status 08/06/2013 FINAL   Final  CULTURE, ROUTINE-ABSCESS     Status: None   Collection Time    08/05/13 12:32 PM      Result Value Ref Range Status   Specimen Description ABSCESS   Final   Special Requests ASPIRATION OF SUBCUTANEOUS FLUID COLLECTION   Final   Gram Stain     Final   Value: ABUNDANT WBC PRESENT,BOTH PMN AND MONONUCLEAR     NO SQUAMOUS EPITHELIAL CELLS SEEN     FEW GRAM POSITIVE COCCI     IN PAIRS     Performed at [facility]   Culture     Final   Value: NO GROWTH 2 DAYS     Performed at [facility]   Report Status PENDING   Incomplete  CULTURE, ROUTINE-ABSCESS     Status: None   Collection Time    08/05/13 12:32 PM      Result Value Ref Range Status   Specimen Description ABSCESS   Final   Special Requests ASPIRATE OF PERIHEPATIC COLLECTION   Final   Gram Stain     Final   Value: ABUNDANT WBC PRESENT,BOTH PMN AND MONONUCLEAR     NO SQUAMOUS EPITHELIAL CELLS SEEN     RARE GRAM POSITIVE COCCI     IN PAIRS     Performed at [facility]   Culture     Final   Value: NO GROWTH 2 DAYS     Performed at [facility]   Report Status PENDING  Incomplete  ANAEROBIC CULTURE     Status: None   Collection Time    08/05/13 12:32 PM      Result Value Ref Range Status   Specimen Description ABSCESS   Final   Special Requests ASPIRATE OF PERIHEPATIC COLLECTION   Final   Gram Stain     Final   Value: ABUNDANT WBC PRESENT,BOTH PMN AND MONONUCLEAR     NO SQUAMOUS EPITHELIAL CELLS SEEN     RARE GRAM POSITIVE COCCI     IN PAIRS     Performed at  [facility]   Culture     Final   Value: NO ANAEROBES ISOLATED; CULTURE IN PROGRESS FOR 5 DAYS     Performed at [facility]   Report Status PENDING   Incomplete  ANAEROBIC CULTURE     Status: None   Collection Time    08/05/13 12:32 PM      Result Value Ref Range Status   Specimen Description ABSCESS   Final   Special Requests ASPIRATE OF SUBUTANEOUS FLUID COLLECTION   Final   Gram Stain     Final   Value: ABUNDANT WBC PRESENT,BOTH PMN AND MONONUCLEAR     NO SQUAMOUS EPITHELIAL CELLS SEEN     FEW GRAM POSITIVE COCCI     IN PAIRS     Performed at [facility]   Culture     Final   Value: NO ANAEROBES ISOLATED; CULTURE IN PROGRESS FOR 5 DAYS     Performed at [facility]   Report Status PENDING   Incomplete  MRSA PCR SCREENING     Status: None   Collection Time    08/05/13  5:54 PM      Result Value Ref Range Status   MRSA by PCR NEGATIVE  NEGATIVE Final   Comment:            The GeneXpert MRSA Assay (FDA     approved for NASAL specimens     only), is one component of a     comprehensive MRSA colonization     surveillance program. It is not     intended to diagnose MRSA     infection nor to guide or     monitor treatment for     MRSA infections.    Medical History: Past Medical History  Diagnosis Date  . Diverticulosis of colon (without mention of hemorrhage)   . Pure hypercholesterolemia   . Other abnormal glucose   . Essential hypertension, benign   . IBS (irritable bowel syndrome)   . Obesity, unspecified   . Unspecified vitamin D deficiency   . Left knee pain 2012    eval by ortho - bone spurs and DJD, treated with steroid shot which helped temporarily    Medications:  Scheduled:  . lidocaine  15 mL Mouth/Throat Once  . metoprolol tartrate  25 mg Oral BID   Assessment: 25 yoF presented with abdominal pain/peridiverticular abscess.  Patient previously started on multiple antibiotics.  Pharmacy consulted for start of primaxin  and continue Vancomycin per ID.  Currently Tmax is 99.3, WBC dec slightly 24.3, and Crcl ~74 ml/min.  Patient weight is 140 kg.  Primaxin is dosed on Crcl and patient weight.  Vancomycin restarted at last dose.  4/1 LVQ x 1 4/1 Aztreonam x1 4/1 Vanc>> stopped 4/8>>RESTARTED 4/8>> Vanc 2.5gm 4/1>>4/3 VR 13.4>>start 1750mg  IV q24h VT on 4/7 was 20.9>>dec to Vanc 1g q12h 4/1 Cipro>> 4/8 4/1 Flagyl>>4/8  4/1 Urine Cx >>neg 4/1 Blood Cx x2>>1/2 CONS; 1/2 Diptheroids (contaminants) 4/6 Urn cx: neg 4/6 anaroebic cx (x2): GS-few G+, pairs, NGTD 4/6 abscess cx (x2):GS-few G+, NGTD  Goal of Therapy:  Vancomycin trough level 15-20 mcg/ml  Plan:  -Start Primaxin 500 mg IV q6H -Restart Vancomycin at last dose of 1000 mg IV q12H -Stop Cipro and Flagyl -Monitor Temp, WBC, renal function, and clinical improvements  Jeronimo Norma, PharmD Clinical Pharmacist Resident Pager: [phone]  Jeronimo Norma 08/07/2013,3:43 PM

## 2013-08-07 NOTE — Progress Notes (Signed)
[facility] Progress Note    Subjective: Patient continues to do well.  Her pathology has not demonstrated any evidence of tumor.  Seems inflammatory.  Objective: Vital signs in last 24 hours: Temp:  [97.5 F (36.4 C)-99.3 F (37.4 C)] 98.7 F (37.1 C) (04/08 0852) Pulse Rate:  [93-167] 101 (04/08 0700) Resp:  [27-40] 28 (04/08 0700) BP: (113-154)/(64-92) 128/84 mmHg (04/08 0700) SpO2:  [93 %-99 %] 97 % (04/08 0700) Last BM Date: 08/06/13  Intake/Output from previous day: 04/07 0701 - 04/08 0700 In: 3480.4 [I.V.:2360.4; IV Piggyback:1100] Out: 554 [Urine:480; Drains:74] Intake/Output this shift:    General: No acute distress  Lungs: Clear  Abd: Good bowel sounds.  Nontender.  Percutaneous drain output not a lot.  Cultures are still pending.  Extremities: No changes  Neuro: Intact  Lab Results:  @LABLAST2 (wbc:2,hgb:2,hct:2,plt:2) BMET  Recent Labs  08/06/13 1703 08/07/13 0410  NA 143 142  K 3.6* 3.6*  CL 107 107  CO2 23 23  GLUCOSE 98 118*  BUN 24* 21  CREATININE 1.17* 1.14*  CALCIUM 7.8* 7.7*   PT/INR  Recent Labs  08/05/13 2013 08/06/13 0240  LABPROT 19.6* 18.2*  INR 1.71* 1.55*   ABG No results found for this basename: PHART, PCO2, PO2, HCO3,  in the last 72 hours  Studies/Results: Ct Abdomen Pelvis Wo Contrast  08/05/2013   CLINICAL DATA:  Evaluate intra-abdominal hemorrhage. Angiogram this morning. Known liver mass.  EXAM: CT ABDOMEN AND PELVIS WITHOUT CONTRAST  TECHNIQUE: Multidetector CT imaging of the abdomen and pelvis was performed following the standard protocol without intravenous contrast.  COMPARISON:  US BIOPSY dated 08/05/2013; CT ABD/PELV WO CM dated 08/01/2013; CT ABD/PELV WO CM dated 07/31/2013  FINDINGS: Small bilateral pleural effusions with bilateral lower lobe atelectasis.  There is a pigtail drainage catheter in the midline of the upper abdomen, extending inferior to the right hepatic lobe. The previously seen sub hepatic fluid collection  is being drained by the pigtail drainage catheter and collection is now decompressed. There is gas within the left hepatic lobe mass. Left hepatic lobe mass measures 10 cm AP x 13.8 cm transverse. This is larger than on the prior examination and there is some increased attenuation within the mass, some of which represents Gel-Foam and some of which represents hemorrhage associated with instrumentation earlier today. There is an anterior left hepatic lobe subcapsular hematoma which is difficult to clearly define but measures 3.3 cm in thickness with layering hematocrit level.  In the anatomic pelvis, there is an air-fluid level which appears distinct from the collapsed urinary bladder which has a Foley catheter. This may be within the deep pelvic abscess anterior to the uterus however the pelvis is poorly visualized in the absence of IV contrast. Hemo peritoneum is present tracking along the left abdominal wall, outlining the pericolic gutter.  Vicarious excretion of contrast is present in the gallbladder. Kidneys and adrenals appear within normal limits. There is no bowel obstruction. Fluid collection in the anterior abdominal wall appears slightly smaller compatible with needle aspiration. Foley catheter present urinary bladder. 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  IMPRESSION: 1. Interval placement of pigtail drainage catheter in the subhepatic region inferior to the left hepatic lobe. 2. Small amount of bleeding into the left hepatic lobe mass. New gas within the mass is compatible with Gel-Foam from procedure or earlier today. 3. Small anterior left hepatic lobe subcapsular hematoma with hematocrit level. 4. Moderate hemoperitoneum is new. 5. Probable abscess in the anterior anatomic pelvis  superior to the dome of the urinary bladder (image 76 series 2).   Electronically Signed   By: Dereck Ligas M.D.   On: 08/05/2013 17:16   [place] Abscess Drain  08/05/2013   CLINICAL DATA:  11 cm left  hepatic mass, subcutaneous soft tissue nodule and perihepatic fluid collection by prior CT.  EXAM: 1. ULTRASOUND-GUIDED CORE BIOPSY OF THE LIVER 2. ULTRASOUND-GUIDED ASPIRATION OF SUBCUTANEOUS FLUID COLLECTION 3. ULTRASOUND-GUIDED PERCUTANEOUS CATHETER DRAINAGE OF PERIHEPATIC ABSCESS  MEDICATIONS: 3.0 mg IV Versed; 100 mcg IV Fentanyl  Total Moderate Sedation Time: 24 minutes.  PROCEDURE: The procedure, risks, benefits, and alternatives were explained to the patient. Questions regarding the procedure were encouraged and answered. The patient understands and consents to the procedure.  The abdominal wall was prepped with Betadine in a sterile fashion, and a sterile drape was applied covering the operative field. A sterile gown and sterile gloves were used for the procedure. Local anesthesia was provided with 1% Lidocaine.  The liver, subcutaneous tissues and perihepatic region were first examined by ultrasound. An 18 gauge needle was advanced into the midline subcutaneous abdominal wall fluid collection and aspiration performed. Aspirated fluid sample was sent for culture analysis.  Ultrasound-guided biopsy was performed of a hepatic mass in the left lobe of the liver. A 17 gauge needle was advanced into the lesion. A total of 2 separate 18 gauge core biopsy samples were obtained and submitted in formalin. A slurry of sterile saline and Gel-Foam pledgets was injected through the outer needle as it was retracted out of the liver. Ultrasound was performed immediately post biopsy.  An 18 gauge needle was advanced into a left perihepatic/ subhepatic fluid collection. Aspiration was performed. A fluid sample was sent for culture. A guidewire was advanced into the collection. The tract was dilated and a 12 [place] percutaneous drain advanced into the collection. The drain was connected to a suction bulb. It was secured at the skin with a Prolene retention suture and StatLock device.  COMPLICATIONS: None.  FINDINGS: The  midline subcutaneous abnormality superficial to the abdominal wall seen by CT corresponds to a complex fluid collection by ultrasound with some surrounding tissue thickening. Aspiration yielded grossly purulent fluid. The cavity was largely decompressed but needle aspiration and knee fluid sample sent for culture analysis.  The nearly 11 cm heterogeneous mass in the lateral segment of the left lobe of the liver was sampled, revealing solid tissue. There was considerable bleeding from the trocar needle after core biopsy and Gel-Foam pledgets were instilled as the outer needle was removed. No active bleeding was visualized from the surface of the liver by ultrasound immediately after completion of the biopsy.  The complex fluid collection adjacent to the left lobe of the liver and extending into the peritoneal cavity shows visible enlargement by ultrasound compared to a prior ultrasound 5 days ago with maximal dimensions of approximately 10 cm. This also appears to communicate with some additional complex fluid in the midline abdomen. Aspiration yielded bloody and turbid fluid which appeared likely infected. For this reason, a percutaneous drain was placed. A 12 French drain was placed over a wire and attached to a suction bulb. Fluid samples from this collection were sent for culture analysis as well as cytology.  IMPRESSION: 1. Aspiration of midline subcutaneous fluid collection yielded purulent fluid. The small collection was nearly completely decompressed by needle aspiration and a sample of fluid sent for culture analysis. 2. Ultrasound-guided core biopsy was performed of the large left lobe hepatic  mass. The percutaneous tract was embolized with Gel-Foam pledgets due to bleeding from the outer needle during the biopsy. There was no immediate evidence of active bleeding from the surface of the liver upon completion of the biopsy. Core samples were sent for histologic analysis. 3. Enlargement of complex fluid  collection adjacent to the left lobe of the liver and extending into the peritoneal cavity. Aspiration yielded bloody and turbid fluid which was sent for culture analysis and cytologic analysis. A 12 French percutaneous drain was placed in this collection and attached to a suction bulb. Output will be followed.   Electronically Signed   By: Aletta Edouard M.D.   On: 08/05/2013 13:45   US Biopsy  08/05/2013   CLINICAL DATA:  11 cm left hepatic mass, subcutaneous soft tissue nodule and perihepatic fluid collection by prior CT.  EXAM: 1. ULTRASOUND-GUIDED CORE BIOPSY OF THE LIVER 2. ULTRASOUND-GUIDED ASPIRATION OF SUBCUTANEOUS FLUID COLLECTION 3. ULTRASOUND-GUIDED PERCUTANEOUS CATHETER DRAINAGE OF PERIHEPATIC ABSCESS  MEDICATIONS: 3.0 mg IV Versed; 100 mcg IV Fentanyl  Total Moderate Sedation Time: 24 minutes.  PROCEDURE: The procedure, risks, benefits, and alternatives were explained to the patient. Questions regarding the procedure were encouraged and answered. The patient understands and consents to the procedure.  The abdominal wall was prepped with Betadine in a sterile fashion, and a sterile drape was applied covering the operative field. A sterile gown and sterile gloves were used for the procedure. Local anesthesia was provided with 1% Lidocaine.  The liver, subcutaneous tissues and perihepatic region were first examined by ultrasound. An 18 gauge needle was advanced into the midline subcutaneous abdominal wall fluid collection and aspiration performed. Aspirated fluid sample was sent for culture analysis.  Ultrasound-guided biopsy was performed of a hepatic mass in the left lobe of the liver. A 17 gauge needle was advanced into the lesion. A total of 2 separate 18 gauge core biopsy samples were obtained and submitted in formalin. A slurry of sterile saline and Gel-Foam pledgets was injected through the outer needle as it was retracted out of the liver. Ultrasound was performed immediately post biopsy.  An  18 gauge needle was advanced into a left perihepatic/ subhepatic fluid collection. Aspiration was performed. A fluid sample was sent for culture. A guidewire was advanced into the collection. The tract was dilated and a 12 [place] percutaneous drain advanced into the collection. The drain was connected to a suction bulb. It was secured at the skin with a Prolene retention suture and StatLock device.  COMPLICATIONS: None.  FINDINGS: The midline subcutaneous abnormality superficial to the abdominal wall seen by CT corresponds to a complex fluid collection by ultrasound with some surrounding tissue thickening. Aspiration yielded grossly purulent fluid. The cavity was largely decompressed but needle aspiration and knee fluid sample sent for culture analysis.  The nearly 11 cm heterogeneous mass in the lateral segment of the left lobe of the liver was sampled, revealing solid tissue. There was considerable bleeding from the trocar needle after core biopsy and Gel-Foam pledgets were instilled as the outer needle was removed. No active bleeding was visualized from the surface of the liver by ultrasound immediately after completion of the biopsy.  The complex fluid collection adjacent to the left lobe of the liver and extending into the peritoneal cavity shows visible enlargement by ultrasound compared to a prior ultrasound 5 days ago with maximal dimensions of approximately 10 cm. This also appears to communicate with some additional complex fluid in the midline abdomen. Aspiration yielded bloody  and turbid fluid which appeared likely infected. For this reason, a percutaneous drain was placed. A 12 French drain was placed over a wire and attached to a suction bulb. Fluid samples from this collection were sent for culture analysis as well as cytology.  IMPRESSION: 1. Aspiration of midline subcutaneous fluid collection yielded purulent fluid. The small collection was nearly completely decompressed by needle aspiration and a  sample of fluid sent for culture analysis. 2. Ultrasound-guided core biopsy was performed of the large left lobe hepatic mass. The percutaneous tract was embolized with Gel-Foam pledgets due to bleeding from the outer needle during the biopsy. There was no immediate evidence of active bleeding from the surface of the liver upon completion of the biopsy. Core samples were sent for histologic analysis. 3. Enlargement of complex fluid collection adjacent to the left lobe of the liver and extending into the peritoneal cavity. Aspiration yielded bloody and turbid fluid which was sent for culture analysis and cytologic analysis. A 12 French percutaneous drain was placed in this collection and attached to a suction bulb. Output will be followed.   Electronically Signed   By: Aletta Edouard M.D.   On: 08/05/2013 13:45   US Aspiration  08/05/2013   CLINICAL DATA:  11 cm left hepatic mass, subcutaneous soft tissue nodule and perihepatic fluid collection by prior CT.  EXAM: 1. ULTRASOUND-GUIDED CORE BIOPSY OF THE LIVER 2. ULTRASOUND-GUIDED ASPIRATION OF SUBCUTANEOUS FLUID COLLECTION 3. ULTRASOUND-GUIDED PERCUTANEOUS CATHETER DRAINAGE OF PERIHEPATIC ABSCESS  MEDICATIONS: 3.0 mg IV Versed; 100 mcg IV Fentanyl  Total Moderate Sedation Time: 24 minutes.  PROCEDURE: The procedure, risks, benefits, and alternatives were explained to the patient. Questions regarding the procedure were encouraged and answered. The patient understands and consents to the procedure.  The abdominal wall was prepped with Betadine in a sterile fashion, and a sterile drape was applied covering the operative field. A sterile gown and sterile gloves were used for the procedure. Local anesthesia was provided with 1% Lidocaine.  The liver, subcutaneous tissues and perihepatic region were first examined by ultrasound. An 18 gauge needle was advanced into the midline subcutaneous abdominal wall fluid collection and aspiration performed. Aspirated fluid sample  was sent for culture analysis.  Ultrasound-guided biopsy was performed of a hepatic mass in the left lobe of the liver. A 17 gauge needle was advanced into the lesion. A total of 2 separate 18 gauge core biopsy samples were obtained and submitted in formalin. A slurry of sterile saline and Gel-Foam pledgets was injected through the outer needle as it was retracted out of the liver. Ultrasound was performed immediately post biopsy.  An 18 gauge needle was advanced into a left perihepatic/ subhepatic fluid collection. Aspiration was performed. A fluid sample was sent for culture. A guidewire was advanced into the collection. The tract was dilated and a 12 [place] percutaneous drain advanced into the collection. The drain was connected to a suction bulb. It was secured at the skin with a Prolene retention suture and StatLock device.  COMPLICATIONS: None.  FINDINGS: The midline subcutaneous abnormality superficial to the abdominal wall seen by CT corresponds to a complex fluid collection by ultrasound with some surrounding tissue thickening. Aspiration yielded grossly purulent fluid. The cavity was largely decompressed but needle aspiration and knee fluid sample sent for culture analysis.  The nearly 11 cm heterogeneous mass in the lateral segment of the left lobe of the liver was sampled, revealing solid tissue. There was considerable bleeding from the trocar needle after core  biopsy and Gel-Foam pledgets were instilled as the outer needle was removed. No active bleeding was visualized from the surface of the liver by ultrasound immediately after completion of the biopsy.  The complex fluid collection adjacent to the left lobe of the liver and extending into the peritoneal cavity shows visible enlargement by ultrasound compared to a prior ultrasound 5 days ago with maximal dimensions of approximately 10 cm. This also appears to communicate with some additional complex fluid in the midline abdomen. Aspiration yielded  bloody and turbid fluid which appeared likely infected. For this reason, a percutaneous drain was placed. A 12 French drain was placed over a wire and attached to a suction bulb. Fluid samples from this collection were sent for culture analysis as well as cytology.  IMPRESSION: 1. Aspiration of midline subcutaneous fluid collection yielded purulent fluid. The small collection was nearly completely decompressed by needle aspiration and a sample of fluid sent for culture analysis. 2. Ultrasound-guided core biopsy was performed of the large left lobe hepatic mass. The percutaneous tract was embolized with Gel-Foam pledgets due to bleeding from the outer needle during the biopsy. There was no immediate evidence of active bleeding from the surface of the liver upon completion of the biopsy. Core samples were sent for histologic analysis. 3. Enlargement of complex fluid collection adjacent to the left lobe of the liver and extending into the peritoneal cavity. Aspiration yielded bloody and turbid fluid which was sent for culture analysis and cytologic analysis. A 12 French percutaneous drain was placed in this collection and attached to a suction bulb. Output will be followed.   Electronically Signed   By: Aletta Edouard M.D.   On: 08/05/2013 13:45    Anti-infectives: Anti-infectives   Start     Dose/Rate Route Frequency Ordered Stop   08/07/13 0600  vancomycin (VANCOCIN) IVPB 1000 mg/200 mL premix  Status:  Discontinued     1,000 mg 200 mL/hr over 60 Minutes Intravenous Every 12 hours 08/06/13 1802 08/07/13 0918   08/06/13 1815  vancomycin (VANCOCIN) IVPB 1000 mg/200 mL premix     1,000 mg 200 mL/hr over 60 Minutes Intravenous NOW 08/06/13 1802 08/06/13 1945   08/04/13 0500  vancomycin (VANCOCIN) 1,250 mg in sodium chloride 0.9 % 250 mL IVPB  Status:  Discontinued     1,250 mg 166.7 mL/hr over 90 Minutes Intravenous Every 12 hours 08/03/13 1507 08/06/13 1802   08/02/13 1200  levofloxacin (LEVAQUIN) IVPB  500 mg  Status:  Discontinued     500 mg 100 mL/hr over 60 Minutes Intravenous Every 48 hours 07/31/13 1812 07/31/13 2002   08/02/13 1000  ciprofloxacin (CIPRO) IVPB 400 mg     400 mg 200 mL/hr over 60 Minutes Intravenous Every 12 hours 08/02/13 0911     08/02/13 0400  vancomycin (VANCOCIN) 1,750 mg in sodium chloride 0.9 % 500 mL IVPB  Status:  Discontinued     1,750 mg 250 mL/hr over 120 Minutes Intravenous Every 24 hours 08/02/13 0357 08/03/13 1507   08/01/13 1000  ciprofloxacin (CIPRO) IVPB 400 mg  Status:  Discontinued     400 mg 200 mL/hr over 60 Minutes Intravenous Every 24 hours 08/01/13 0959 08/02/13 0911   07/31/13 2015  vancomycin (VANCOCIN) 2,500 mg in sodium chloride 0.9 % 500 mL IVPB     2,500 mg 250 mL/hr over 120 Minutes Intravenous  Once 07/31/13 2000 07/31/13 2346   07/31/13 2000  metroNIDAZOLE (FLAGYL) IVPB 500 mg     500 mg 100  mL/hr over 60 Minutes Intravenous Every 8 hours 07/31/13 1812     07/31/13 1830  aztreonam (AZACTAM) 1 g in dextrose 5 % 50 mL IVPB  Status:  Discontinued     1 g 100 mL/hr over 30 Minutes Intravenous Every 8 hours 07/31/13 1815 08/01/13 0958   07/31/13 1130  levofloxacin (LEVAQUIN) IVPB 750 mg     750 mg 100 mL/hr over 90 Minutes Intravenous  Once 07/31/13 1123 07/31/13 1310   07/31/13 1130  metroNIDAZOLE (FLAGYL) IVPB 500 mg     500 mg 100 mL/hr over 60 Minutes Intravenous  Once 07/31/13 1123 07/31/13 1310      Assessment/Plan: s/p  Advance diet Still no plans for surgery.  ID consultation appropriate.  LOS: 7 days   [name]. Dahlia Bailiff, MD, FACS [phone] [facility] Surgery 08/07/2013

## 2013-08-07 NOTE — Progress Notes (Addendum)
PULMONARY / CRITICAL CARE MEDICINE   Name: Leah WOLVERTON MRN: [id number] DOB: 03/25/1956    ADMISSION DATE:  07/31/2013 PRIMARY SERVICE: PCCM LOS 7 days   CHIEF COMPLAINT:  Abdominal pain and diarrhea  BRIEF PATIENT DESCRIPTION: 25 yof with a PMH of diverticulitis (last treatment one year ago), HTN, IBS, and obesity who presents with abdominal pain and diarrhea noted to be hypotensive in ED with BP 80's/60's and mildly elevated lactic acid at 3.27.  Abd CT showed diverticulitis and free fluid collection with largest one 9.3 cm at left lobe of liver. Went for [place] bx of liver mass 4/6, returned and was hypotensive with fevers/chills, abdominal pain.  PCCM re-consulted.  SIGNIFICANT EVENTS / STUDIES:  4/2 [place] Abd: Solid mass in hepatic segment 2 of 10.3cm demonstrating internal vascularity. DDx includes hepatocellular carcinoma, metastatic disease, or benign neoplasms including focal nodular hyperplasia and adenoma. Adjacent subcapsular complex cystic structure with fluid levels favoring subcapsular hematoma-almost certainly related to the adjacent mass lesion. When pt clinically able, evaluation with MRI Abd w/wo contrast is recommended to further assess solid mass.  4/2 CT Abd/Pelvis: Partial SBO, transition point not definitive but appears in left anterior pelvis where there are inflammatory changes. Also, 5.5cm collection in left lower pelvis contiguous with uterus. Pt had diverticulosis in this location previously and may reflect peridiverticular abscess. Extensive diverticulosis with an area of wall thickening of mid sigmoid colon which could support diverticulitis. Small amt of ascites. There is focal fluid collection between left lobe of liver and stomach. Also a small mass/fluid collection in subcutaneous fat of upper abdominal midline measuring 2.5 x 2.1cm. There is a hypo attenuating mass-like lesion in lateral left lobe of liver measuring 9.3cm.   4/2 Repeat CT Abd/Pelvis with rectal  contrast: communication of the sigmoid colon lumen to a small and thick walled abscess with fistula demonstrated from the sigmoid colon lumen after administration of rectal contrast. The liquefied central portion of this abscess only measures 1.8 cm. Multitude of other areas of free fluid and loculated fluid are seen in the peritoneal cavity with the largest focal fluid in the gastrohepatic space under the left lobe of the liver. Persistent large mass in the left lobe of the liver with associated subcutaneous nodule which may represent metastatic disease in the midline anterior subcutaneous fat.  4/6 IR: Core bx of left lobe liver mass, aspiration of subq fluid collection, perc drainage of perihepatic fluid.  Returned to SDU hypotensive, tachycardic, with fevers/chills. PCCM called.  4/7 CT Abd/Pelvis wo contrast:  Interval placement of pigtail drainage catheter in the subhepatic region inferior to the left hepatic lobe. There is a small amt of bleeding into the left hepatic lobe mass. New gas within the mass is compatible with Gel-Foam from procedure or earlier today. Small anterior left hepatic lobe subcapsular hematoma with hematocrit level. Moderate hemoperitoneum is new. Probable abscess in the anterior anatomic pelvis superior to the dome of the urinary bladder.    4/6 FNA of hepatic/perihepatic fluid:  Cytology reveals no malignant cells; acute inflammation.   LINES / TUBES: JP abdominal drain 4/6 >>> R IJ CVL 4/2 >>> Foley 4/1 >>>  CULTURES:  MRSA 4/6>> NEG BC 4/1>>1/2 gpc in pairs, 2/2 gpr>>2/2 Cornyebacterium sp UC 4/1>>NG FINAL Abscess (perihepatic) 4/6 >>> Abscess (SQ fluid) 4/6>> Hepatic mass/perihepatic fluid cytology 4/6 >>>no malignant cells; acute inflammation  ANTIBIOTICS: Aztreonam 4/1>>4/2 Levaquin 4/1>>4/1 Metronidazole 4/1>>  Vancomycin 4/1>>  Cipro 4/2>>   SUBJECTIVE: Pt had episode of sinus tach  last PM but asymptomatic. Denies CP, fevers/chills, N/V.    VITAL  SIGNS: Temp:  [97.5 F (36.4 C)-99.3 F (37.4 C)] 98.4 F (36.9 C) (04/08 0421) Pulse Rate:  [93-167] 101 (04/08 0700) Resp:  [21-40] 28 (04/08 0700) BP: (100-154)/(64-92) 128/84 mmHg (04/08 0700) SpO2:  [93 %-99 %] 97 % (04/08 0700)  HEMODYNAMICS:    VENTILATOR SETTINGS:   INTAKE / OUTPUT: Intake/Output     04/07 0701 - 04/08 0700 04/08 0701 - 04/09 0700   I.V. (mL/kg) 2360.4 (16.8)    Blood     Other 20    IV Piggyback 1100    Total Intake(mL/kg) 3480.4 (24.8)    Urine (mL/kg/hr) 480 (0.1)    Drains 74 (0)    Total Output 554     Net +2926.4          Urine Occurrence 3 x    Stool Occurrence 1 x     PHYSICAL EXAMINATION: General:  Pleasant obese female, resting in bed, in NAD.  Neuro:   A&O x 3 HEENT:  PERRL, EOMI.  Cardiovascular:  RRR, no M/R/G Lungs:  CTAB Abdomen:  obese, +BS; soft, JP drain in place - sanguinous fluid. Skin:  warm, dry; no rash  LABS:  PULMONARY No results found for this basename: PHART, PCO2, PCO2ART, PO2, PO2ART, HCO3, TCO2, O2SAT,  in the last 168 hours  CBC  Recent Labs Lab 08/06/13 1413 08/06/13 1703 08/07/13 0008  HGB 8.3* 8.4* 8.3*  HCT 24.9* 25.1* 24.6*  WBC 23.9* 25.0* 24.3*  PLT 178 184 178    COAGULATION  Recent Labs Lab 08/02/13 1010 08/04/13 0500 08/04/13 1730 08/05/13 2013 08/06/13 0240  INR 1.61* 1.55* 1.62* 1.71* 1.55*    CARDIAC    Recent Labs Lab 07/31/13 2320 08/01/13 0417 08/07/13 0410  TROPONINI <0.30 <0.30 <0.30    Recent Labs Lab 07/31/13 1100  PROBNP 3000.0*     CHEMISTRY  Recent Labs Lab 08/02/13 0300  08/03/13 0539 08/04/13 0500 08/05/13 0500 08/06/13 0240 08/06/13 1703 08/07/13 0410  NA 137  < > 143 142 139 142 143 142  K 3.2*  < > 3.2* 3.5* 3.5* 3.7 3.6* 3.6*  CL 101  < > 104 103 102 105 107 107  CO2 20  < > 23 25 26 24 23 23   GLUCOSE 115*  < > 122* 120* 115* 112* 98 118*  BUN 49*  < > 29* 21 15 24* 24* 21  CREATININE 2.44*  < > 1.27* 0.99 0.81 1.19* 1.17* 1.14*   CALCIUM 7.8*  < > 8.1* 8.3* 7.8* 7.2* 7.8* 7.7*  MG 1.9  --  2.2 2.1  --  1.8 1.9 1.7  PHOS 4.1  --  3.1  --   --  4.0 2.9  --   < > = values in this interval not displayed. Estimated Creatinine Clearance: 74.7 ml/min (by C-G formula based on Cr of 1.14).   LIVER  Recent Labs Lab 07/31/13 2320 08/02/13 0300 08/02/13 1010 08/04/13 0500 08/04/13 1730 08/05/13 0500 08/05/13 2013 08/06/13 0240  AST 83* 87*  --  23  --  17  --  84*  ALT 129* 120*  --  53*  --  35  --  48*  ALKPHOS 85 66  --  81  --  91  --  78  BILITOT 1.1 0.9  --  0.7  --  0.8  --  1.0  PROT 6.7 6.3  --  6.1  --  6.0  --  5.4*  ALBUMIN 2.4* 2.0*  --  1.9*  --  1.9*  --  1.8*  INR 1.61*  --  1.61* 1.55* 1.62*  --  1.71* 1.55*     INFECTIOUS  Recent Labs Lab 07/31/13 1139 07/31/13 2320 08/01/13 0417  LATICACIDVEN 3.27* 1.8 1.1     ENDOCRINE CBG (last 3)   Recent Labs  08/05/13 1708  GLUCAP 114*    IMAGING x48h  Ct Abdomen Pelvis Wo Contrast  08/05/2013   CLINICAL DATA:  Evaluate intra-abdominal hemorrhage. Angiogram this morning. Known liver mass.  EXAM: CT ABDOMEN AND PELVIS WITHOUT CONTRAST  TECHNIQUE: Multidetector CT imaging of the abdomen and pelvis was performed following the standard protocol without intravenous contrast.  COMPARISON:  US BIOPSY dated 08/05/2013; CT ABD/PELV WO CM dated 08/01/2013; CT ABD/PELV WO CM dated 07/31/2013  FINDINGS: Small bilateral pleural effusions with bilateral lower lobe atelectasis.  There is a pigtail drainage catheter in the midline of the upper abdomen, extending inferior to the right hepatic lobe. The previously seen sub hepatic fluid collection is being drained by the pigtail drainage catheter and collection is now decompressed. There is gas within the left hepatic lobe mass. Left hepatic lobe mass measures 10 cm AP x 13.8 cm transverse. This is larger than on the prior examination and there is some increased attenuation within the mass, some of which represents  Gel-Foam and some of which represents hemorrhage associated with instrumentation earlier today. There is an anterior left hepatic lobe subcapsular hematoma which is difficult to clearly define but measures 3.3 cm in thickness with layering hematocrit level.  In the anatomic pelvis, there is an air-fluid level which appears distinct from the collapsed urinary bladder which has a Foley catheter. This may be within the deep pelvic abscess anterior to the uterus however the pelvis is poorly visualized in the absence of IV contrast. Hemo peritoneum is present tracking along the left abdominal wall, outlining the pericolic gutter.  Vicarious excretion of contrast is present in the gallbladder. Kidneys and adrenals appear within normal limits. There is no bowel obstruction. Fluid collection in the anterior abdominal wall appears slightly smaller compatible with needle aspiration. Foley catheter present urinary bladder. 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  IMPRESSION: 1. Interval placement of pigtail drainage catheter in the subhepatic region inferior to the left hepatic lobe. 2. Small amount of bleeding into the left hepatic lobe mass. New gas within the mass is compatible with Gel-Foam from procedure or earlier today. 3. Small anterior left hepatic lobe subcapsular hematoma with hematocrit level. 4. Moderate hemoperitoneum is new. 5. Probable abscess in the anterior anatomic pelvis superior to the dome of the urinary bladder (image 76 series 2).   Electronically Signed   By: Dereck Ligas M.D.   On: 08/05/2013 17:16   [place] Abscess Drain  08/05/2013   CLINICAL DATA:  11 cm left hepatic mass, subcutaneous soft tissue nodule and perihepatic fluid collection by prior CT.  EXAM: 1. ULTRASOUND-GUIDED CORE BIOPSY OF THE LIVER 2. ULTRASOUND-GUIDED ASPIRATION OF SUBCUTANEOUS FLUID COLLECTION 3. ULTRASOUND-GUIDED PERCUTANEOUS CATHETER DRAINAGE OF PERIHEPATIC ABSCESS  MEDICATIONS: 3.0 mg IV Versed; 100 mcg  IV Fentanyl  Total Moderate Sedation Time: 24 minutes.  PROCEDURE: The procedure, risks, benefits, and alternatives were explained to the patient. Questions regarding the procedure were encouraged and answered. The patient understands and consents to the procedure.  The abdominal wall was prepped with Betadine in a sterile fashion, and a sterile drape was applied covering the operative field.  A sterile gown and sterile gloves were used for the procedure. Local anesthesia was provided with 1% Lidocaine.  The liver, subcutaneous tissues and perihepatic region were first examined by ultrasound. An 18 gauge needle was advanced into the midline subcutaneous abdominal wall fluid collection and aspiration performed. Aspirated fluid sample was sent for culture analysis.  Ultrasound-guided biopsy was performed of a hepatic mass in the left lobe of the liver. A 17 gauge needle was advanced into the lesion. A total of 2 separate 18 gauge core biopsy samples were obtained and submitted in formalin. A slurry of sterile saline and Gel-Foam pledgets was injected through the outer needle as it was retracted out of the liver. Ultrasound was performed immediately post biopsy.  An 18 gauge needle was advanced into a left perihepatic/ subhepatic fluid collection. Aspiration was performed. A fluid sample was sent for culture. A guidewire was advanced into the collection. The tract was dilated and a 12 [place] percutaneous drain advanced into the collection. The drain was connected to a suction bulb. It was secured at the skin with a Prolene retention suture and StatLock device.  COMPLICATIONS: None.  FINDINGS: The midline subcutaneous abnormality superficial to the abdominal wall seen by CT corresponds to a complex fluid collection by ultrasound with some surrounding tissue thickening. Aspiration yielded grossly purulent fluid. The cavity was largely decompressed but needle aspiration and knee fluid sample sent for culture analysis.  The  nearly 11 cm heterogeneous mass in the lateral segment of the left lobe of the liver was sampled, revealing solid tissue. There was considerable bleeding from the trocar needle after core biopsy and Gel-Foam pledgets were instilled as the outer needle was removed. No active bleeding was visualized from the surface of the liver by ultrasound immediately after completion of the biopsy.  The complex fluid collection adjacent to the left lobe of the liver and extending into the peritoneal cavity shows visible enlargement by ultrasound compared to a prior ultrasound 5 days ago with maximal dimensions of approximately 10 cm. This also appears to communicate with some additional complex fluid in the midline abdomen. Aspiration yielded bloody and turbid fluid which appeared likely infected. For this reason, a percutaneous drain was placed. A 12 French drain was placed over a wire and attached to a suction bulb. Fluid samples from this collection were sent for culture analysis as well as cytology.  IMPRESSION: 1. Aspiration of midline subcutaneous fluid collection yielded purulent fluid. The small collection was nearly completely decompressed by needle aspiration and a sample of fluid sent for culture analysis. 2. Ultrasound-guided core biopsy was performed of the large left lobe hepatic mass. The percutaneous tract was embolized with Gel-Foam pledgets due to bleeding from the outer needle during the biopsy. There was no immediate evidence of active bleeding from the surface of the liver upon completion of the biopsy. Core samples were sent for histologic analysis. 3. Enlargement of complex fluid collection adjacent to the left lobe of the liver and extending into the peritoneal cavity. Aspiration yielded bloody and turbid fluid which was sent for culture analysis and cytologic analysis. A 12 French percutaneous drain was placed in this collection and attached to a suction bulb. Output will be followed.   Electronically  Signed   By: Aletta Edouard M.D.   On: 08/05/2013 13:45   US Biopsy  08/05/2013   CLINICAL DATA:  11 cm left hepatic mass, subcutaneous soft tissue nodule and perihepatic fluid collection by prior CT.  EXAM: 1. ULTRASOUND-GUIDED CORE  BIOPSY OF THE LIVER 2. ULTRASOUND-GUIDED ASPIRATION OF SUBCUTANEOUS FLUID COLLECTION 3. ULTRASOUND-GUIDED PERCUTANEOUS CATHETER DRAINAGE OF PERIHEPATIC ABSCESS  MEDICATIONS: 3.0 mg IV Versed; 100 mcg IV Fentanyl  Total Moderate Sedation Time: 24 minutes.  PROCEDURE: The procedure, risks, benefits, and alternatives were explained to the patient. Questions regarding the procedure were encouraged and answered. The patient understands and consents to the procedure.  The abdominal wall was prepped with Betadine in a sterile fashion, and a sterile drape was applied covering the operative field. A sterile gown and sterile gloves were used for the procedure. Local anesthesia was provided with 1% Lidocaine.  The liver, subcutaneous tissues and perihepatic region were first examined by ultrasound. An 18 gauge needle was advanced into the midline subcutaneous abdominal wall fluid collection and aspiration performed. Aspirated fluid sample was sent for culture analysis.  Ultrasound-guided biopsy was performed of a hepatic mass in the left lobe of the liver. A 17 gauge needle was advanced into the lesion. A total of 2 separate 18 gauge core biopsy samples were obtained and submitted in formalin. A slurry of sterile saline and Gel-Foam pledgets was injected through the outer needle as it was retracted out of the liver. Ultrasound was performed immediately post biopsy.  An 18 gauge needle was advanced into a left perihepatic/ subhepatic fluid collection. Aspiration was performed. A fluid sample was sent for culture. A guidewire was advanced into the collection. The tract was dilated and a 12 [place] percutaneous drain advanced into the collection. The drain was connected to a suction bulb. It was  secured at the skin with a Prolene retention suture and StatLock device.  COMPLICATIONS: None.  FINDINGS: The midline subcutaneous abnormality superficial to the abdominal wall seen by CT corresponds to a complex fluid collection by ultrasound with some surrounding tissue thickening. Aspiration yielded grossly purulent fluid. The cavity was largely decompressed but needle aspiration and knee fluid sample sent for culture analysis.  The nearly 11 cm heterogeneous mass in the lateral segment of the left lobe of the liver was sampled, revealing solid tissue. There was considerable bleeding from the trocar needle after core biopsy and Gel-Foam pledgets were instilled as the outer needle was removed. No active bleeding was visualized from the surface of the liver by ultrasound immediately after completion of the biopsy.  The complex fluid collection adjacent to the left lobe of the liver and extending into the peritoneal cavity shows visible enlargement by ultrasound compared to a prior ultrasound 5 days ago with maximal dimensions of approximately 10 cm. This also appears to communicate with some additional complex fluid in the midline abdomen. Aspiration yielded bloody and turbid fluid which appeared likely infected. For this reason, a percutaneous drain was placed. A 12 French drain was placed over a wire and attached to a suction bulb. Fluid samples from this collection were sent for culture analysis as well as cytology.  IMPRESSION: 1. Aspiration of midline subcutaneous fluid collection yielded purulent fluid. The small collection was nearly completely decompressed by needle aspiration and a sample of fluid sent for culture analysis. 2. Ultrasound-guided core biopsy was performed of the large left lobe hepatic mass. The percutaneous tract was embolized with Gel-Foam pledgets due to bleeding from the outer needle during the biopsy. There was no immediate evidence of active bleeding from the surface of the liver upon  completion of the biopsy. Core samples were sent for histologic analysis. 3. Enlargement of complex fluid collection adjacent to the left lobe of the liver and extending  into the peritoneal cavity. Aspiration yielded bloody and turbid fluid which was sent for culture analysis and cytologic analysis. A 12 French percutaneous drain was placed in this collection and attached to a suction bulb. Output will be followed.   Electronically Signed   By: Aletta Edouard M.D.   On: 08/05/2013 13:45   US Aspiration  08/05/2013   CLINICAL DATA:  11 cm left hepatic mass, subcutaneous soft tissue nodule and perihepatic fluid collection by prior CT.  EXAM: 1. ULTRASOUND-GUIDED CORE BIOPSY OF THE LIVER 2. ULTRASOUND-GUIDED ASPIRATION OF SUBCUTANEOUS FLUID COLLECTION 3. ULTRASOUND-GUIDED PERCUTANEOUS CATHETER DRAINAGE OF PERIHEPATIC ABSCESS  MEDICATIONS: 3.0 mg IV Versed; 100 mcg IV Fentanyl  Total Moderate Sedation Time: 24 minutes.  PROCEDURE: The procedure, risks, benefits, and alternatives were explained to the patient. Questions regarding the procedure were encouraged and answered. The patient understands and consents to the procedure.  The abdominal wall was prepped with Betadine in a sterile fashion, and a sterile drape was applied covering the operative field. A sterile gown and sterile gloves were used for the procedure. Local anesthesia was provided with 1% Lidocaine.  The liver, subcutaneous tissues and perihepatic region were first examined by ultrasound. An 18 gauge needle was advanced into the midline subcutaneous abdominal wall fluid collection and aspiration performed. Aspirated fluid sample was sent for culture analysis.  Ultrasound-guided biopsy was performed of a hepatic mass in the left lobe of the liver. A 17 gauge needle was advanced into the lesion. A total of 2 separate 18 gauge core biopsy samples were obtained and submitted in formalin. A slurry of sterile saline and Gel-Foam pledgets was injected through  the outer needle as it was retracted out of the liver. Ultrasound was performed immediately post biopsy.  An 18 gauge needle was advanced into a left perihepatic/ subhepatic fluid collection. Aspiration was performed. A fluid sample was sent for culture. A guidewire was advanced into the collection. The tract was dilated and a 12 [place] percutaneous drain advanced into the collection. The drain was connected to a suction bulb. It was secured at the skin with a Prolene retention suture and StatLock device.  COMPLICATIONS: None.  FINDINGS: The midline subcutaneous abnormality superficial to the abdominal wall seen by CT corresponds to a complex fluid collection by ultrasound with some surrounding tissue thickening. Aspiration yielded grossly purulent fluid. The cavity was largely decompressed but needle aspiration and knee fluid sample sent for culture analysis.  The nearly 11 cm heterogeneous mass in the lateral segment of the left lobe of the liver was sampled, revealing solid tissue. There was considerable bleeding from the trocar needle after core biopsy and Gel-Foam pledgets were instilled as the outer needle was removed. No active bleeding was visualized from the surface of the liver by ultrasound immediately after completion of the biopsy.  The complex fluid collection adjacent to the left lobe of the liver and extending into the peritoneal cavity shows visible enlargement by ultrasound compared to a prior ultrasound 5 days ago with maximal dimensions of approximately 10 cm. This also appears to communicate with some additional complex fluid in the midline abdomen. Aspiration yielded bloody and turbid fluid which appeared likely infected. For this reason, a percutaneous drain was placed. A 12 French drain was placed over a wire and attached to a suction bulb. Fluid samples from this collection were sent for culture analysis as well as cytology.  IMPRESSION: 1. Aspiration of midline subcutaneous fluid collection  yielded purulent fluid. The small collection was nearly  completely decompressed by needle aspiration and a sample of fluid sent for culture analysis. 2. Ultrasound-guided core biopsy was performed of the large left lobe hepatic mass. The percutaneous tract was embolized with Gel-Foam pledgets due to bleeding from the outer needle during the biopsy. There was no immediate evidence of active bleeding from the surface of the liver upon completion of the biopsy. Core samples were sent for histologic analysis. 3. Enlargement of complex fluid collection adjacent to the left lobe of the liver and extending into the peritoneal cavity. Aspiration yielded bloody and turbid fluid which was sent for culture analysis and cytologic analysis. A 12 French percutaneous drain was placed in this collection and attached to a suction bulb. Output will be followed.   Electronically Signed   By: Aletta Edouard M.D.   On: 08/05/2013 13:45    ASSESSMENT / PLAN:  GASTROINTESTINAL A:   Sigmoid Fistula with Abscess Diverticulitis with Diverticulosis  Liver mass s/p biopsy 4/6, is this lass vs abscess? Hepatic subcapsular hematoma Subq fluid collection in epigastrium Hemoperitoneum    P:   Clears, surgery will advance as indicated hepatic mass/perihepatic fluid cytology - no malignant cells; acute inflammation Follow lft in am   PULMONARY A:  No acute issues. P:   Continue supplemental O2 PRN. IS addition Mobilize as able  CARDIOVASCULAR A:  hypotension, improved s/p NS bolus, PRBCs - d/t acute blood loss  h/o HTN sinus tachycardia, intermittently -trop x1neg; afebrile without hypoxia R/o SVT episodes Low clinical suspicion PE, as not sustained  Trop neg P:  checking TSH consider TTE; elevated BNP Limit gross overload resuc, would prefer PRBC when needed Tele Doppler legs and combine with low clinical suspicion Add BB  RENAL A:   ATN , hypoperfusion; creatinine trending down Hypokalemia,  hypophos P:   Maintenance fluids 75cc/hr, reduce replete K+, Mg+ suppl , k, phos  HEMATOLOGIC A:   Acute blood loss - d/t hepatic subcapsular hematoma; hgb 8.3 s/p 2 units PRBCs, 2 units FFP demargination P:  Check CBC in am  VTE px: SCDs Holding anticoagulation, coags corrected  INFECTIOUS A:   Abd SubQ Abscess ?Pelvic abscess corybact cont> likely  Liver abscess? 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    And edite dinfull  Lavon Paganini. Titus Mould, MD, [place] Pgr: [facility]

## 2013-08-07 NOTE — Treatment Plan (Signed)
Patient transferred to [address] bed 39. Left unit in no acute distress. Respirations remains even and unlabored. VSS. Denies pain and/or discomfort at present. Daughter called and made aware of patient's location.

## 2013-08-08 ENCOUNTER — Other Ambulatory Visit: Payer: Self-pay

## 2013-08-08 ENCOUNTER — Encounter: Payer: Self-pay | Admitting: Family Medicine

## 2013-08-08 ENCOUNTER — Inpatient Hospital Stay (HOSPITAL_COMMUNITY): Payer: BC Managed Care – PPO

## 2013-08-08 DIAGNOSIS — B952 Enterococcus as the cause of diseases classified elsewhere: Secondary | ICD-10-CM

## 2013-08-08 LAB — CBC
HCT: 25.5 % — ABNORMAL LOW (ref 36.0–46.0)
Hemoglobin: 8.4 g/dL — ABNORMAL LOW (ref 12.0–15.0)
MCH: 26.5 pg (ref 26.0–34.0)
MCHC: 32.9 g/dL (ref 30.0–36.0)
MCV: 80.4 fL (ref 78.0–100.0)
PLATELETS: 240 10*3/uL (ref 150–400)
RBC: 3.17 MIL/uL — ABNORMAL LOW (ref 3.87–5.11)
RDW: 16 % — AB (ref 11.5–15.5)
WBC: 23 10*3/uL — AB (ref 4.0–10.5)

## 2013-08-08 LAB — BASIC METABOLIC PANEL
BUN: 14 mg/dL (ref 6–23)
CALCIUM: 8 mg/dL — AB (ref 8.4–10.5)
CO2: 24 mEq/L (ref 19–32)
Chloride: 104 mEq/L (ref 96–112)
Creatinine, Ser: 1.18 mg/dL — ABNORMAL HIGH (ref 0.50–1.10)
GFR, EST AFRICAN AMERICAN: 59 mL/min — AB (ref >90)
GFR, EST NON AFRICAN AMERICAN: 51 mL/min — AB (ref >90)
GLUCOSE: 99 mg/dL (ref 70–99)
Potassium: 3.7 mEq/L (ref 3.7–5.3)
SODIUM: 141 meq/L (ref 137–147)

## 2013-08-08 LAB — CULTURE, ROUTINE-ABSCESS

## 2013-08-08 LAB — MRSA PCR SCREENING: MRSA BY PCR: NEGATIVE

## 2013-08-08 MED ORDER — MIDAZOLAM HCL 2 MG/2ML IJ SOLN
INTRAMUSCULAR | Status: AC
  Filled 2013-08-08: qty 4

## 2013-08-08 MED ORDER — IOHEXOL 300 MG/ML  SOLN
100.0000 mL | Freq: Once | INTRAMUSCULAR | Status: AC | PRN
  Administered 2013-08-08: 35 mL via INTRAVENOUS

## 2013-08-08 MED ORDER — FENTANYL CITRATE 0.05 MG/ML IJ SOLN
INTRAMUSCULAR | Status: AC
  Filled 2013-08-08: qty 2

## 2013-08-08 MED ORDER — MIDAZOLAM HCL 2 MG/2ML IJ SOLN
INTRAMUSCULAR | Status: AC | PRN
  Administered 2013-08-08: 1 mg via INTRAVENOUS
  Administered 2013-08-08: 2 mg via INTRAVENOUS

## 2013-08-08 MED ORDER — SODIUM CHLORIDE 0.9 % IJ SOLN
10.0000 mL | INTRAMUSCULAR | Status: DC | PRN
  Administered 2013-08-09 – 2013-08-10 (×4): 10 mL

## 2013-08-08 MED ORDER — FENTANYL CITRATE 0.05 MG/ML IJ SOLN
INTRAMUSCULAR | Status: AC | PRN
  Administered 2013-08-08: 100 ug via INTRAVENOUS

## 2013-08-08 MED ORDER — POTASSIUM CHLORIDE CRYS ER 20 MEQ PO TBCR
40.0000 meq | EXTENDED_RELEASE_TABLET | Freq: Once | ORAL | Status: AC
  Administered 2013-08-08: 40 meq via ORAL
  Filled 2013-08-08: qty 2

## 2013-08-08 MED ORDER — SODIUM CHLORIDE 0.9 % IV SOLN
1.0000 g | INTRAVENOUS | Status: DC
  Administered 2013-08-08 – 2013-08-10 (×3): 1 g via INTRAVENOUS
  Filled 2013-08-08 (×3): qty 1

## 2013-08-08 NOTE — Progress Notes (Signed)
Peripherally Inserted Central Catheter/Midline Placement  The IV Nurse has discussed with the patient and/or persons authorized to consent for the patient, the purpose of this procedure and the potential benefits and risks involved with this procedure.  The benefits include less needle sticks, lab draws from the catheter and patient may be discharged home with the catheter.  Risks include, but not limited to, infection, bleeding, blood clot (thrombus formation), and puncture of an artery; nerve damage and irregular heat beat.  Alternatives to this procedure were also discussed.  PICC/Midline Placement Documentation        Leah Peterson 08/08/2013, 3:12 PM

## 2013-08-08 NOTE — Procedures (Signed)
Successful IVC FILTER INSERTION NO COMP STABLE

## 2013-08-08 NOTE — H&P (Signed)
Leah Peterson is an 58 y.o. female.   Chief Complaint: pt known to IR service 4/6: liver biopsy; subcutaneous epigastric mass aspiration; perihepatic abscess drain placed. Post procedure bleeding; hepatic hematoma Now newly developed B lower extremity swelling US doppler + B DVT--cannot safely anticoagulate secondary to recent hepatic hematoma Request mad for retrievable Inferior Vena Cava filter placement BUN/CR: 2/1.14----repeat today  HPI: diverticulitis; HLD; HTN  Past Medical History  Diagnosis Date  . Diverticulosis of colon (without mention of hemorrhage)   . Pure hypercholesterolemia   . Other abnormal glucose   . Essential hypertension, benign   . IBS (irritable bowel syndrome)   . Obesity, unspecified   . Unspecified vitamin D deficiency   . Left knee pain 2012    eval by ortho - bone spurs and DJD, treated with steroid shot which helped temporarily    Past Surgical History  Procedure Laterality Date  . Cesarean section      due to BP elevation  . Colonoscopy  2000    Diverticuli-Dr. Sharlett Iles    Family History  Problem Relation Age of Onset  . Hypertension Father   . Cancer Father     prostate  . Heart disease Father     CABG x 2  . Cancer Mother     Lung-quit smoking 20 years prior to Dx  . Hypertension Mother   . Diabetes      + family hx  . Drug abuse Brother   . Stroke Maternal Grandmother   . Sleep apnea Brother     with [facility] estranged   Social History:  reports that she has quit smoking. She has never used smokeless tobacco. She reports that she drinks alcohol. She reports that she does not use illicit drugs.  Allergies:  Allergies  Allergen Reactions  . Lisinopril     REACTION: lethargy, cough.  . Penicillins     REACTION: LYMPH NODES SWELL    Medications Prior to Admission  Medication Sig Dispense Refill  . amLODipine (NORVASC) 5 MG tablet Take 5 mg by mouth daily.      . cetirizine (ZYRTEC) 10 MG tablet Take 10 mg by mouth daily  as needed.        . cholecalciferol (VITAMIN D) 1000 UNITS tablet Take 1,000 Units by mouth daily.       . Multiple Vitamin (MULTIVITAMIN WITH MINERALS) TABS tablet Take 1 tablet by mouth daily.      Marland Kitchen triamterene-hydrochlorothiazide (MAXZIDE-25) 37.5-25 MG per tablet Take 1 tablet by mouth daily.      Marland Kitchen dicyclomine (BENTYL) 10 MG capsule Take 1 capsule (10 mg total) by mouth 4 (four) times daily as needed.  30 capsule  1    Results for orders placed during the hospital encounter of 07/31/13 (from the past 48 hour(s))  HEMOGLOBIN AND HEMATOCRIT, BLOOD     Status: Abnormal   Collection Time    08/06/13  8:00 AM      Result Value Ref Range   Hemoglobin 8.3 (*) 12.0 - 15.0 g/dL   HCT 24.7 (*) 36.0 - 46.0 %  HEMOGLOBIN AND HEMATOCRIT, BLOOD     Status: Abnormal   Collection Time    08/06/13 11:00 AM      Result Value Ref Range   Hemoglobin 8.6 (*) 12.0 - 15.0 g/dL   HCT 25.2 (*) 36.0 - 46.0 %  CBC     Status: Abnormal   Collection Time    08/06/13  2:13 PM  Result Value Ref Range   WBC 23.9 (*) 4.0 - 10.5 K/uL   RBC 3.16 (*) 3.87 - 5.11 MIL/uL   Hemoglobin 8.3 (*) 12.0 - 15.0 g/dL   HCT 24.9 (*) 36.0 - 46.0 %   MCV 78.8  78.0 - 100.0 fL   MCH 26.3  26.0 - 34.0 pg   MCHC 33.3  30.0 - 36.0 g/dL   RDW 15.7 (*) 11.5 - 15.5 %   Platelets 178  150 - 400 K/uL  VANCOMYCIN, TROUGH     Status: Abnormal   Collection Time    08/06/13  3:50 PM      Result Value Ref Range   Vancomycin Tr 20.9 (*) 10.0 - 20.0 ug/mL  BASIC METABOLIC PANEL     Status: Abnormal   Collection Time    08/06/13  5:03 PM      Result Value Ref Range   Sodium 143  137 - 147 mEq/L   Potassium 3.6 (*) 3.7 - 5.3 mEq/L   Chloride 107  96 - 112 mEq/L   CO2 23  19 - 32 mEq/L   Glucose, Bld 98  70 - 99 mg/dL   BUN 24 (*) 6 - 23 mg/dL   Creatinine, Ser 1.17 (*) 0.50 - 1.10 mg/dL   Calcium 7.8 (*) 8.4 - 10.5 mg/dL   GFR calc non Af Amer 51 (*) >90 mL/min   GFR calc Af Amer 60 (*) >90 mL/min   Comment: (NOTE)      The eGFR has been calculated using the CKD EPI equation.     This calculation has not been validated in all clinical situations.     eGFR's persistently <90 mL/min signify possible Chronic Kidney     Disease.  MAGNESIUM     Status: None   Collection Time    08/06/13  5:03 PM      Result Value Ref Range   Magnesium 1.9  1.5 - 2.5 mg/dL  PHOSPHORUS     Status: None   Collection Time    08/06/13  5:03 PM      Result Value Ref Range   Phosphorus 2.9  2.3 - 4.6 mg/dL  CBC     Status: Abnormal   Collection Time    08/06/13  5:03 PM      Result Value Ref Range   WBC 25.0 (*) 4.0 - 10.5 K/uL   RBC 3.18 (*) 3.87 - 5.11 MIL/uL   Hemoglobin 8.4 (*) 12.0 - 15.0 g/dL   HCT 25.1 (*) 36.0 - 46.0 %   MCV 78.9  78.0 - 100.0 fL   MCH 26.4  26.0 - 34.0 pg   MCHC 33.5  30.0 - 36.0 g/dL   RDW 15.8 (*) 11.5 - 15.5 %   Platelets 184  150 - 400 K/uL  CBC     Status: Abnormal   Collection Time    08/07/13 12:08 AM      Result Value Ref Range   WBC 24.3 (*) 4.0 - 10.5 K/uL   RBC 3.13 (*) 3.87 - 5.11 MIL/uL   Hemoglobin 8.3 (*) 12.0 - 15.0 g/dL   HCT 24.6 (*) 36.0 - 46.0 %   MCV 78.6  78.0 - 100.0 fL   MCH 26.5  26.0 - 34.0 pg   MCHC 33.7  30.0 - 36.0 g/dL   RDW 15.9 (*) 11.5 - 15.5 %   Platelets 178  150 - 400 K/uL  BASIC METABOLIC PANEL     Status: Abnormal  Collection Time    08/07/13  4:10 AM      Result Value Ref Range   Sodium 142  137 - 147 mEq/L   Potassium 3.6 (*) 3.7 - 5.3 mEq/L   Chloride 107  96 - 112 mEq/L   CO2 23  19 - 32 mEq/L   Glucose, Bld 118 (*) 70 - 99 mg/dL   BUN 21  6 - 23 mg/dL   Creatinine, Ser 1.14 (*) 0.50 - 1.10 mg/dL   Calcium 7.7 (*) 8.4 - 10.5 mg/dL   GFR calc non Af Amer 53 (*) >90 mL/min   GFR calc Af Amer 62 (*) >90 mL/min   Comment: (NOTE)     The eGFR has been calculated using the CKD EPI equation.     This calculation has not been validated in all clinical situations.     eGFR's persistently <90 mL/min signify possible Chronic Kidney     Disease.   MAGNESIUM     Status: None   Collection Time    08/07/13  4:10 AM      Result Value Ref Range   Magnesium 1.7  1.5 - 2.5 mg/dL  TROPONIN I     Status: None   Collection Time    08/07/13  4:10 AM      Result Value Ref Range   Troponin I <0.30  <0.30 ng/mL   Comment:            Due to the release kinetics of cTnI,     a negative result within the first hours     of the onset of symptoms does not rule out     myocardial infarction with certainty.     If myocardial infarction is still suspected,     repeat the test at appropriate intervals.  CBC     Status: Abnormal   Collection Time    08/07/13  7:29 AM      Result Value Ref Range   WBC 25.6 (*) 4.0 - 10.5 K/uL   Comment: REPEATED TO VERIFY     CONSISTENT WITH PREVIOUS RESULT   RBC 3.03 (*) 3.87 - 5.11 MIL/uL   Hemoglobin 8.0 (*) 12.0 - 15.0 g/dL   HCT 23.9 (*) 36.0 - 46.0 %   MCV 78.9  78.0 - 100.0 fL   MCH 26.4  26.0 - 34.0 pg   MCHC 33.5  30.0 - 36.0 g/dL   RDW 15.9 (*) 11.5 - 15.5 %   Platelets 201  150 - 400 K/uL  TSH     Status: None   Collection Time    08/07/13  9:00 AM      Result Value Ref Range   TSH 4.460  0.350 - 4.500 uIU/mL   Comment: Please note change in reference range.  TROPONIN I     Status: None   Collection Time    08/07/13 10:10 AM      Result Value Ref Range   Troponin I <0.30  <0.30 ng/mL   Comment:            Due to the release kinetics of cTnI,     a negative result within the first hours     of the onset of symptoms does not rule out     myocardial infarction with certainty.     If myocardial infarction is still suspected,     repeat the test at appropriate intervals.  TROPONIN I     Status: None   Collection  Time    08/07/13  3:00 PM      Result Value Ref Range   Troponin I <0.30  <0.30 ng/mL   Comment:            Due to the release kinetics of cTnI,     a negative result within the first hours     of the onset of symptoms does not rule out     myocardial infarction with  certainty.     If myocardial infarction is still suspected,     repeat the test at appropriate intervals.  CBC     Status: Abnormal   Collection Time    08/08/13  2:51 AM      Result Value Ref Range   WBC 23.0 (*) 4.0 - 10.5 K/uL   RBC 3.17 (*) 3.87 - 5.11 MIL/uL   Hemoglobin 8.4 (*) 12.0 - 15.0 g/dL   HCT 25.5 (*) 36.0 - 46.0 %   MCV 80.4  78.0 - 100.0 fL   MCH 26.5  26.0 - 34.0 pg   MCHC 32.9  30.0 - 36.0 g/dL   RDW 16.0 (*) 11.5 - 15.5 %   Platelets 240  150 - 400 K/uL  MRSA PCR SCREENING     Status: None   Collection Time    08/08/13  4:58 AM      Result Value Ref Range   MRSA by PCR NEGATIVE  NEGATIVE   Comment:            The GeneXpert MRSA Assay (FDA     approved for NASAL specimens     only), is one component of a     comprehensive MRSA colonization     surveillance program. It is not     intended to diagnose MRSA     infection nor to guide or     monitor treatment for     MRSA infections.   No results found.  Review of Systems  Constitutional: Negative for fever.  Respiratory: Negative for cough and shortness of breath.   Cardiovascular: Negative for chest pain.  Gastrointestinal: Positive for abdominal pain. Negative for nausea and vomiting.  Neurological: Positive for weakness. Negative for headaches.  Psychiatric/Behavioral: Negative for substance abuse.    Blood pressure 156/95, pulse 98, temperature 98.2 F (36.8 C), temperature source Oral, resp. rate 18, height _0  (1.549 m), weight 140.6 kg (309 lb 15.5 oz), SpO2 98.00%. Physical Exam  Constitutional: She is oriented to person, place, and time. She appears well-nourished.  obese  Cardiovascular: Normal rate and regular rhythm.   No murmur heard. Respiratory: Effort normal and breath sounds normal. She has no wheezes.  GI: Soft. Bowel sounds are normal. There is no tenderness.  Musculoskeletal: Normal range of motion. She exhibits edema.  B lower extr  Neurological: She is alert and oriented to  person, place, and time.  Skin: Skin is warm and dry.  Psychiatric: She has a normal mood and affect. Her behavior is normal. Judgment and thought content normal.     Assessment/Plan B lower extr DVT Cannot safely anticoagulate secondary recent post liver bx hematoma Scheduled now for retrievable IVC filter Pt aware of procedure benefitis and risks and agreeable to proceed Consent signed andin chart Check Cr  Lavonia Drafts 08/08/2013, 7:32 AM

## 2013-08-08 NOTE — Progress Notes (Signed)
[facility] Progress Note    Subjective: Patient now has bilateral DVTs, but because of the hepatic bleeding will get a filter and not be anticoagulated.  Objective: Vital signs in last 24 hours: Temp:  [97.7 F (36.5 C)-98.7 F (37.1 C)] 98.2 F (36.8 C) (04/09 0506) Pulse Rate:  [78-105] 98 (04/09 0506) Resp:  [18-41] 18 (04/09 0506) BP: (126-156)/(74-101) 156/95 mmHg (04/09 0506) SpO2:  [95 %-100 %] 98 % (04/09 0506) Last BM Date: 08/07/13  Intake/Output from previous day: 04/08 0701 - 04/09 0700 In: 832 [I.V.:252; IV Piggyback:550] Out: 87 [Drains:87] Intake/Output this shift:    General: Mildly tachypneic, but no acute distress  Lungs: Clear but shallow  Abd: No abdominal tenderness.  Good bowel sounds.  Drain in epigastrium still draining over 75cc daily.  No growth on micro  Extremities: LE swelling  Neuro: Intact  Lab Results:  @LABLAST2 (wbc:2,hgb:2,hct:2,plt:2) BMET  Recent Labs  08/06/13 1703 08/07/13 0410  NA 143 142  K 3.6* 3.6*  CL 107 107  CO2 23 23  GLUCOSE 98 118*  BUN 24* 21  CREATININE 1.17* 1.14*  CALCIUM 7.8* 7.7*   PT/INR  Recent Labs  08/05/13 2013 08/06/13 0240  LABPROT 19.6* 18.2*  INR 1.71* 1.55*   ABG No results found for this basename: PHART, PCO2, PO2, HCO3,  in the last 72 hours  Studies/Results: No results found.  Anti-infectives: Anti-infectives   Start     Dose/Rate Route Frequency Ordered Stop   08/07/13 2000  imipenem-cilastatin (PRIMAXIN) 500 mg in sodium chloride 0.9 % 100 mL IVPB     500 mg 200 mL/hr over 30 Minutes Intravenous 4 times per day 08/07/13 1608     08/07/13 1800  imipenem-cilastatin (PRIMAXIN) 500 mg in sodium chloride 0.9 % 100 mL IVPB  Status:  Discontinued     500 mg 200 mL/hr over 30 Minutes Intravenous 4 times per day 08/07/13 1604 08/07/13 1608   08/07/13 1615  vancomycin (VANCOCIN) IVPB 1000 mg/200 mL premix     1,000 mg 200 mL/hr over 60 Minutes Intravenous Every 12 hours 08/07/13 1606      08/07/13 1300  metroNIDAZOLE (FLAGYL) IVPB 500 mg  Status:  Discontinued     500 mg 100 mL/hr over 60 Minutes Intravenous Every 8 hours 08/07/13 1201 08/07/13 1535   08/07/13 1215  ciprofloxacin (CIPRO) IVPB 400 mg  Status:  Discontinued     400 mg 200 mL/hr over 60 Minutes Intravenous Every 12 hours 08/07/13 1201 08/07/13 1535   08/07/13 1200  imipenem-cilastatin (PRIMAXIN) 500 mg in sodium chloride 0.9 % 100 mL IVPB  Status:  Discontinued     500 mg 200 mL/hr over 30 Minutes Intravenous 4 times per day 08/07/13 1132 08/07/13 1157   08/07/13 0600  vancomycin (VANCOCIN) IVPB 1000 mg/200 mL premix  Status:  Discontinued     1,000 mg 200 mL/hr over 60 Minutes Intravenous Every 12 hours 08/06/13 1802 08/07/13 0918   08/06/13 1815  vancomycin (VANCOCIN) IVPB 1000 mg/200 mL premix     1,000 mg 200 mL/hr over 60 Minutes Intravenous NOW 08/06/13 1802 08/06/13 1945   08/04/13 0500  vancomycin (VANCOCIN) 1,250 mg in sodium chloride 0.9 % 250 mL IVPB  Status:  Discontinued     1,250 mg 166.7 mL/hr over 90 Minutes Intravenous Every 12 hours 08/03/13 1507 08/06/13 1802   08/02/13 1200  levofloxacin (LEVAQUIN) IVPB 500 mg  Status:  Discontinued     500 mg 100 mL/hr over 60 Minutes Intravenous Every 48  hours 07/31/13 1812 07/31/13 2002   08/02/13 1000  ciprofloxacin (CIPRO) IVPB 400 mg  Status:  Discontinued     400 mg 200 mL/hr over 60 Minutes Intravenous Every 12 hours 08/02/13 0911 08/07/13 1002   08/02/13 0400  vancomycin (VANCOCIN) 1,750 mg in sodium chloride 0.9 % 500 mL IVPB  Status:  Discontinued     1,750 mg 250 mL/hr over 120 Minutes Intravenous Every 24 hours 08/02/13 0357 08/03/13 1507   08/01/13 1000  ciprofloxacin (CIPRO) IVPB 400 mg  Status:  Discontinued     400 mg 200 mL/hr over 60 Minutes Intravenous Every 24 hours 08/01/13 0959 08/02/13 0911   07/31/13 2015  vancomycin (VANCOCIN) 2,500 mg in sodium chloride 0.9 % 500 mL IVPB     2,500 mg 250 mL/hr over 120 Minutes  Intravenous  Once 07/31/13 2000 07/31/13 2346   07/31/13 2000  metroNIDAZOLE (FLAGYL) IVPB 500 mg  Status:  Discontinued     500 mg 100 mL/hr over 60 Minutes Intravenous Every 8 hours 07/31/13 1812 08/07/13 1005   07/31/13 1830  aztreonam (AZACTAM) 1 g in dextrose 5 % 50 mL IVPB  Status:  Discontinued     1 g 100 mL/hr over 30 Minutes Intravenous Every 8 hours 07/31/13 1815 08/01/13 0958   07/31/13 1130  levofloxacin (LEVAQUIN) IVPB 750 mg     750 mg 100 mL/hr over 90 Minutes Intravenous  Once 07/31/13 1123 07/31/13 1310   07/31/13 1130  metroNIDAZOLE (FLAGYL) IVPB 500 mg     500 mg 100 mL/hr over 60 Minutes Intravenous  Once 07/31/13 1123 07/31/13 1310      Assessment/Plan: s/p  No acute surgical intervention needed.  Patient will need outpatient GI follow up.   Will be getting long term imipenem and vanco  LOS: 8 days   [name]. Dahlia Bailiff, MD, FACS [phone] [facility] Surgery 08/08/2013

## 2013-08-08 NOTE — Progress Notes (Signed)
PULMONARY / CRITICAL CARE MEDICINE   Name: Leah Peterson MRN: [id number] DOB: [birth year]    ADMISSION DATE:  07/31/2013 PRIMARY SERVICE: PCCM LOS 8 days   CHIEF COMPLAINT:  Abdominal pain and diarrhea  BRIEF PATIENT DESCRIPTION: 27 yof with a PMH of diverticulitis (last treatment one year ago), HTN, IBS, and obesity who presents with abdominal pain and diarrhea noted to be hypotensive in ED with BP 80's/60's and mildly elevated lactic acid at 3.27.  Abd CT showed diverticulitis and free fluid collection with largest one 9.3 cm at left lobe of liver. Went for [place] bx of liver mass 4/6, returned and was hypotensive with fevers/chills, abdominal pain.  PCCM re-consulted.  SIGNIFICANT EVENTS / STUDIES:  4/2 [place] Abd: Solid mass in hepatic segment 2 of 10.3cm demonstrating internal vascularity. DDx includes hepatocellular carcinoma, metastatic disease, or benign neoplasms including focal nodular hyperplasia and adenoma. Adjacent subcapsular complex cystic structure with fluid levels favoring subcapsular hematoma-almost certainly related to the adjacent mass lesion. When pt clinically able, evaluation with MRI Abd w/wo contrast is recommended to further assess solid mass. 4/2 CT Abd/Pelvis: Partial SBO, transition point not definitive but appears in left anterior pelvis where there are inflammatory changes. Also, 5.5cm collection in left lower pelvis contiguous with uterus. Pt had diverticulosis in this location previously and may reflect peridiverticular abscess. Extensive diverticulosis with an area of wall thickening of mid sigmoid colon which could support diverticulitis. Small amt of ascites. There is focal fluid collection between left lobe of liver and stomach. Also a small mass/fluid collection in subcutaneous fat of upper abdominal midline measuring 2.5 x 2.1cm. There is a hypo attenuating mass-like lesion in lateral left lobe of liver measuring 9.3cm.  4/2 Repeat CT Abd/Pelvis with rectal  contrast: communication of the sigmoid colon lumen to a small and thick walled abscess with fistula demonstrated from the sigmoid colon lumen after administration of rectal contrast. The liquefied central portion of this abscess only measures 1.8 cm. Multitude of other areas of free fluid and loculated fluid are seen in the peritoneal cavity with the largest focal fluid in the gastrohepatic space under the left lobe of the liver. Persistent large mass in the left lobe of the liver with associated subcutaneous nodule which may represent metastatic disease in the midline anterior subcutaneous fat. 4/6 IR: Core bx of left lobe liver mass, aspiration of subq fluid collection, perc drainage of perihepatic fluid.  Returned to SDU hypotensive, tachycardic, with fevers/chills. PCCM called. 4/7 CT Abd/Pelvis wo contrast:  Interval placement of pigtail drainage catheter in the subhepatic region inferior to the left hepatic lobe. There is a small amt of bleeding into the left hepatic lobe mass. New gas within the mass is compatible with Gel-Foam from procedure or earlier today. Small anterior left hepatic lobe subcapsular hematoma with hematocrit level. Moderate hemoperitoneum is new. Probable abscess in the anterior anatomic pelvis superior to the dome of the urinary bladder.   4/6 FNA of hepatic/perihepatic fluid:   Cytology reveals no malignant cells; acute inflammation.  4/8 LE Dopplers: >> DVT in bilateral PTV's.  Echo: EF 50 - 60%, no RWMA. 4/9 IVC FIlter  LINES / TUBES: JP abdominal drain 4/6 >>> R IJ CVL 4/2 >>> 4/8 OGT 4/1 >>> 4/4 Foley 4/1 >>> Right PICC 4/9 >>>  CULTURES:  MRSA 4/6>> NEG BC 4/1>>1/2 gpc in pairs, 2/2 gpr>>2/2 Cornyebacterium sp UC 4/1>>NG FINAL Abscess (perihepatic) 4/6 >>> Abscess (SQ fluid) 4/6>> Hepatic mass/perihepatic fluid cytology 4/6 >>>no malignant cells; acute  inflammation  ANTIBIOTICS: Aztreonam 4/1>>4/2 Levaquin 4/1>>4/1 Metronidazole 4/1>> 4/8 Vancomycin 4/1>>   Cipro 4/2 >> 4/8 Primaxin 4/8 >>>  SUBJECTIVE: Returned from IVC filter placement.  NAD.  Vitals stable.  VITAL SIGNS: Temp:  [97.7 F (36.5 C)-98.2 F (36.8 C)] 98.2 F (36.8 C) (04/09 0506) Pulse Rate:  [87-105] 89 (04/09 1256) Resp:  [18-31] 30 (04/09 1256) BP: (136-156)/(82-101) 142/88 mmHg (04/09 1256) SpO2:  [95 %-100 %] 98 % (04/09 1256)  HEMODYNAMICS:    VENTILATOR SETTINGS:   INTAKE / OUTPUT: Intake/Output     04/08 0701 - 04/09 0700 04/09 0701 - 04/10 0700   I.V. (mL/kg) 252 (1.8)    Other 30    IV Piggyback 550    Total Intake(mL/kg) 832 (5.9)    Urine (mL/kg/hr)     Drains 87 (0)    Total Output 87     Net +745 0        Urine Occurrence 7 x 1 x   Stool Occurrence 2 x 1 x    PHYSICAL EXAMINATION: General:  Pleasant obese female, resting in bed, in NAD.  Neuro:   A&O x 3 HEENT:  PERRL, EOMI.  Cardiovascular:  RRR, no M/R/G Lungs:  CTAB Abdomen:  obese, +BS; soft, JP drain in place - sanguinous fluid. Skin:  warm, dry; no rash.  New right cephalic PICC  LABS:  PULMONARY No results found for this basename: PHART, PCO2, PCO2ART, PO2, PO2ART, HCO3, TCO2, O2SAT,  in the last 168 hours  CBC  Recent Labs Lab 08/07/13 0008 08/07/13 0729 08/08/13 0251  HGB 8.3* 8.0* 8.4*  HCT 24.6* 23.9* 25.5*  WBC 24.3* 25.6* 23.0*  PLT 178 201 240    COAGULATION  Recent Labs Lab 08/02/13 1010 08/04/13 0500 08/04/13 1730 08/05/13 2013 08/06/13 0240  INR 1.61* 1.55* 1.62* 1.71* 1.55*    CARDIAC    Recent Labs Lab 08/07/13 0410 08/07/13 1010 08/07/13 1500  TROPONINI <0.30 <0.30 <0.30   No results found for this basename: PROBNP,  in the last 168 hours   CHEMISTRY  Recent Labs Lab 08/02/13 0300  08/03/13 0539 08/04/13 0500 08/05/13 0500 08/06/13 0240 08/06/13 1703 08/07/13 0410 08/08/13 0855  NA 137  < > 143 142 139 142 143 142 141  K 3.2*  < > 3.2* 3.5* 3.5* 3.7 3.6* 3.6* 3.7  CL 101  < > 104 103 102 105 107 107 104  CO2 20  < >  23 25 26 24 23 23 24   GLUCOSE 115*  < > 122* 120* 115* 112* 98 118* 99  BUN 49*  < > 29* 21 15 24* 24* 21 14  CREATININE 2.44*  < > 1.27* 0.99 0.81 1.19* 1.17* 1.14* 1.18*  CALCIUM 7.8*  < > 8.1* 8.3* 7.8* 7.2* 7.8* 7.7* 8.0*  MG 1.9  --  2.2 2.1  --  1.8 1.9 1.7  --   PHOS 4.1  --  3.1  --   --  4.0 2.9  --   --   < > = values in this interval not displayed. Estimated Creatinine Clearance: 72.2 ml/min (by C-G formula based on Cr of 1.18).   LIVER  Recent Labs Lab 08/02/13 0300 08/02/13 1010 08/04/13 0500 08/04/13 1730 08/05/13 0500 08/05/13 2013 08/06/13 0240  AST 87*  --  23  --  17  --  84*  ALT 120*  --  53*  --  35  --  48*  ALKPHOS 66  --  81  --  91  --  78  BILITOT 0.9  --  0.7  --  0.8  --  1.0  PROT 6.3  --  6.1  --  6.0  --  5.4*  ALBUMIN 2.0*  --  1.9*  --  1.9*  --  1.8*  INR  --  1.61* 1.55* 1.62*  --  1.71* 1.55*     INFECTIOUS No results found for this basename: LATICACIDVEN, PROCALCITON,  in the last 168 hours   ENDOCRINE CBG (last 3)   Recent Labs  08/05/13 1708  GLUCAP 114*    IMAGING x48h  Ir Ivc Filter Plmt / S&i /img Guid/mod Sed  08/08/2013   CLINICAL DATA:  Bilateral DVT, patient cannot be anticoagulated because of diffuse hemoperitoneum within the abdomen and pelvis following left hepatic mass biopsy  EXAM: ULTRASOUND GUIDANCE FOR VASCULAR ACCESS  IVC CATHETERIZATION AND VENOGRAM  IVC FILTER INSERTION  Date:  4/9/20154/12/2013 12:57 PM  Radiologist:  Jerilynn Mages. Daryll Brod, MD  Guidance:  Ultrasound fluoroscopic  FLUOROSCOPY TIME:  1 min 36 seconds  MEDICATIONS AND MEDICAL HISTORY: 3 mg Versed, 100 mcg fentanyl  ANESTHESIA/SEDATION: 15 min  CONTRAST:  23mL OMNIPAQUE IOHEXOL 300 MG/ML  SOLN  COMPLICATIONS: No immediate  PROCEDURE: Informed consent was obtained from the patient following explanation of the procedure, risks, benefits and alternatives. The patient understands, agrees and consents for the procedure. All questions were addressed. A time out  was performed.  Maximal barrier sterile technique utilized including caps, mask, sterile gowns, sterile gloves, large sterile drape, hand hygiene, and betadine prep.  Under sterile condition and local anesthesia, right internal jugular venous access was performed with ultrasound. Over a guide wire, the IVC filter delivery sheath and inner dilator were advanced into the IVC just above the IVC bifurcation. Contrast injection was performed for an IVC venogram.  IVC VENOGRAM: The IVC is patent. No evidence of thrombus, stenosis, or occlusion. No variant venous anatomy. The renal veins are identified at L1.  IVC FILTER INSERTION: Through the delivery sheath, the Bard Denali IVC filter was deployed in the infrarenal IVC at the L2-3 level just below the renal veins and above the IVC bifurcation. Contrast injection confirmed position. There is good apposition of the filter against the IVC.  The delivery sheath was removed and hemostasis was obtained with compression for 5 minutes. The patient tolerated the procedure well. No immediate complications.  IMPRESSION: Ultrasound and fluoroscopically guided infrarenal IVC filter insertion.   Electronically Signed   By: Daryll Brod M.D.   On: 08/08/2013 14:01    ASSESSMENT / PLAN:  Sigmoid Fistula with Abscess Diverticulitis with Diverticulosis  Liver mass s/p biopsy 4/6 - FNA shows no malignant cells; acute inflammation. Hepatic subcapsular hematoma Subq fluid collection in epigastrium Hemoperitoneum   Plan: -  OP GI follow up per surgery. - Mobilize as able. - Continue incentive spirometry.  Hypotension, resolved s/p NS bolus, PRBCs - d/t acute blood loss  h/o HTN sinus tachycardia, intermittently -trop x1neg; afebrile without hypoxia Elevated BNP Plan: - Cont lopressor added 4/8.  ATN , hypoperfusion; creatinine trending down Hypokalemia Plan: - Potassium 40 PO - Trend BMP  Acute blood loss - d/t hepatic subcapsular hematoma; hgb 8.4 on  4/9 Provoked Bilateral LE DVT diagnosed 4/8 Plan: - VTE px: SCDs - Holding anticoagulation, coags corrected - Trend CBC - Transfuse for Hgb < 7. - IVC filter today - will need to start anticoagulation when OK by surgery - would treat with anticoagulation 3-6 months -  will need to retrieve filter before 6 months  Abd SubQ Abscess - Abundant WBC's, few GPC in pairs ?Pelvic abscess Liver abscess - acute inflammation Perihepatic fluid - few microaerophilic streptococci Plan: - Follow Micro and abx as above per ID - recommend at least 1 month AND CT showing resolution of liver abscess - OP follow up per ID.  DMII - last HA1C 6.6  Plan: - Diet controlled - cont clears  PCCM to sign off.  TRH to pick up  4/10.  Please call back if we can be of any assistance.  Montey Hora, [facility] Pgr: [phone]  or [phone]  Attending:  I have seen and examined the patient with nurse practitioner/resident and agree with the note above.   Slow, steady progress bilat DVT current set back Would want to anticoagulate 3-6 months to treat DVT when OK by surgery Would like to remove IVC filter in 6 months   Roselie Awkward, MD [facility] Pager: [phone] Cell: [phone] If no response, call [phone]

## 2013-08-08 NOTE — Progress Notes (Signed)
PT Cancellation Note  Patient Details Name: Leah Peterson MRN: [id number] DOB: 1955-12-03   Cancelled Treatment:    Reason Eval/Treat Not Completed: Patient not medically ready.  Pt with Bil LE DVTs and plan for IVC Filter today.  Will f/u tomorrow.     Thornton Papas Genevive Printup 08/08/2013, 12:03 PM

## 2013-08-08 NOTE — Progress Notes (Addendum)
[facility] [facility]ious [facility]    Subjective: No new complaints   Antibiotics:  Anti-infectives   Start     Dose/Rate Route Frequency Ordered Stop   08/07/13 2000  imipenem-cilastatin (PRIMAXIN) 500 mg in sodium chloride 0.9 % 100 mL IVPB     500 mg 200 mL/hr over 30 Minutes Intravenous 4 times per day 08/07/13 1608     08/07/13 1800  imipenem-cilastatin (PRIMAXIN) 500 mg in sodium chloride 0.9 % 100 mL IVPB  Status:  Discontinued     500 mg 200 mL/hr over 30 Minutes Intravenous 4 times per day 08/07/13 1604 08/07/13 1608   08/07/13 1615  vancomycin (VANCOCIN) IVPB 1000 mg/200 mL premix     1,000 mg 200 mL/hr over 60 Minutes Intravenous Every 12 hours 08/07/13 1606     08/07/13 1300  metroNIDAZOLE (FLAGYL) IVPB 500 mg  Status:  Discontinued     500 mg 100 mL/hr over 60 Minutes Intravenous Every 8 hours 08/07/13 1201 08/07/13 1535   08/07/13 1215  ciprofloxacin (CIPRO) IVPB 400 mg  Status:  Discontinued     400 mg 200 mL/hr over 60 Minutes Intravenous Every 12 hours 08/07/13 1201 08/07/13 1535   08/07/13 1200  imipenem-cilastatin (PRIMAXIN) 500 mg in sodium chloride 0.9 % 100 mL IVPB  Status:  Discontinued     500 mg 200 mL/hr over 30 Minutes Intravenous 4 times per day 08/07/13 1132 08/07/13 1157   08/07/13 0600  vancomycin (VANCOCIN) IVPB 1000 mg/200 mL premix  Status:  Discontinued     1,000 mg 200 mL/hr over 60 Minutes Intravenous Every 12 hours 08/06/13 1802 08/07/13 0918   08/06/13 1815  vancomycin (VANCOCIN) IVPB 1000 mg/200 mL premix     1,000 mg 200 mL/hr over 60 Minutes Intravenous NOW 08/06/13 1802 08/06/13 1945   08/04/13 0500  vancomycin (VANCOCIN) 1,250 mg in sodium chloride 0.9 % 250 mL IVPB  Status:  Discontinued     1,250 mg 166.7 mL/hr over 90 Minutes Intravenous Every 12 hours 08/03/13 1507 08/06/13 1802   08/02/13 1200  levofloxacin (LEVAQUIN) IVPB 500 mg  Status:  Discontinued     500 mg 100 mL/hr over 60 Minutes Intravenous Every 48 hours  07/31/13 1812 07/31/13 2002   08/02/13 1000  ciprofloxacin (CIPRO) IVPB 400 mg  Status:  Discontinued     400 mg 200 mL/hr over 60 Minutes Intravenous Every 12 hours 08/02/13 0911 08/07/13 1002   08/02/13 0400  vancomycin (VANCOCIN) 1,750 mg in sodium chloride 0.9 % 500 mL IVPB  Status:  Discontinued     1,750 mg 250 mL/hr over 120 Minutes Intravenous Every 24 hours 08/02/13 0357 08/03/13 1507   08/01/13 1000  ciprofloxacin (CIPRO) IVPB 400 mg  Status:  Discontinued     400 mg 200 mL/hr over 60 Minutes Intravenous Every 24 hours 08/01/13 0959 08/02/13 0911   07/31/13 2015  vancomycin (VANCOCIN) 2,500 mg in sodium chloride 0.9 % 500 mL IVPB     2,500 mg 250 mL/hr over 120 Minutes Intravenous  Once 07/31/13 2000 07/31/13 2346   07/31/13 2000  metroNIDAZOLE (FLAGYL) IVPB 500 mg  Status:  Discontinued     500 mg 100 mL/hr over 60 Minutes Intravenous Every 8 hours 07/31/13 1812 08/07/13 1005   07/31/13 1830  aztreonam (AZACTAM) 1 g in dextrose 5 % 50 mL IVPB  Status:  Discontinued     1 g 100 mL/hr over 30 Minutes Intravenous Every 8 hours 07/31/13 1815 08/01/13 0958  07/31/13 1130  levofloxacin (LEVAQUIN) IVPB 750 mg     750 mg 100 mL/hr over 90 Minutes Intravenous  Once 07/31/13 1123 07/31/13 1310   07/31/13 1130  metroNIDAZOLE (FLAGYL) IVPB 500 mg     500 mg 100 mL/hr over 60 Minutes Intravenous  Once 07/31/13 1123 07/31/13 1310      Medications: Scheduled Meds: . fentaNYL      . imipenem-cilastatin  500 mg Intravenous 4 times per day  . lidocaine  15 mL Mouth/Throat Once  . metoprolol tartrate  25 mg Oral BID  . midazolam      . vancomycin  1,000 mg Intravenous Q12H   Continuous Infusions: . sodium chloride 40 mL/hr at 08/07/13 2216   PRN Meds:.acetaminophen, acetaminophen, fentaNYL, sodium chloride    Objective: Weight change:   Intake/Output Summary (Last 24 hours) at 08/08/13 1425 Last data filed at 08/08/13 1036  Gross per 24 hour  Intake    422 ml  Output      62 ml  Net    360 ml   Blood pressure 142/88, pulse 89, temperature 98.2 F (36.8 C), temperature source Oral, resp. rate 30, height  (1.549 m), weight 309 lb 15.5 oz (140.6 kg), SpO2 98.00%. Temp:  [97.7 F (36.5 C)-98.7 F (37.1 C)] 98.2 F (36.8 C) (04/09 0506) Pulse Rate:  [87-105] 89 (04/09 1256) Resp:  [18-31] 30 (04/09 1256) BP: (136-156)/(82-101) 142/88 mmHg (04/09 1256) SpO2:  [95 %-100 %] 98 % (04/09 1256)  Physical Exam: General: Alert and awake, oriented x3, not in any acute distress. obese  HEENT: anicteric sclera, pupils reactive to light and accommodation, EOMI, oropharynx clear and without exudate  CVS regular rate, normal r, no murmur rubs or gallops  Chest: clear to auscultation bilaterally, no wheezing, rales or rhonchi  Abdomen:ttp, diffusely, Drain with serosanguinous fluid and particulate matter  Extremities: + edema  Skin: no rashes  Neuro: nonfocal, strength and sensation intact   CBC:  Recent Labs Lab 08/02/13 1010 08/04/13 0500 08/04/13 1730 08/05/13 0500  08/05/13 2013  08/06/13 0240  08/06/13 1413 08/06/13 1703 08/07/13 0008 08/07/13 0729 08/08/13 0251  HGB  --   --   --  10.9*  < >  --   < > 8.0*  < > 8.3* 8.4* 8.3* 8.0* 8.4*  HCT  --   --   --  32.4*  < >  --   < > 23.8*  < > 24.9* 25.1* 24.6* 23.9* 25.5*  PLT  --   --   --  158  < >  --   --  176  --  178 184 178 201 240  INR 1.61* 1.55* 1.62*  --   --  1.71*  --  1.55*  --   --   --   --   --   --   APTT  --   --   --  37  --   --   --   --   --   --   --   --   --   --   < > = values in this interval not displayed.   BMET  Recent Labs  08/07/13 0410 08/08/13 0855  NA 142 141  K 3.6* 3.7  CL 107 104  CO2 23 24  GLUCOSE 118* 99  BUN 21 14  CREATININE 1.14* 1.18*  CALCIUM 7.7* 8.0*     Liver Panel   Recent Labs  08/06/13 0240  PROT  5.4*  ALBUMIN 1.8*  AST 84*  ALT 48*  ALKPHOS 78  BILITOT 1.0       Sedimentation Rate No results found for this  basename: ESRSEDRATE,  in the last 72 hours C-Reactive Protein No results found for this basename: CRP,  in the last 72 hours  Micro Results: Recent Results (from the past 240 hour(s))  CULTURE, BLOOD (ROUTINE X 2)     Status: None   Collection Time    07/31/13 11:20 AM      Result Value Ref Range Status   Specimen Description BLOOD RIGHT ANTECUBITAL   Final   Special Requests BOTTLES DRAWN AEROBIC AND ANAEROBIC 5CC   Final   Culture  Setup Time     Final   Value: 07/31/2013 16:19     Performed at [facility]   Culture     Final   Value: STAPHYLOCOCCUS SPECIES (COAGULASE NEGATIVE)     Note: THE SIGNIFICANCE OF ISOLATING THIS ORGANISM FROM A SINGLE SET OF BLOOD CULTURES WHEN MULTIPLE SETS ARE DRAWN IS UNCERTAIN. PLEASE NOTIFY THE MICROBIOLOGY DEPARTMENT WITHIN ONE WEEK IF SPECIATION AND SENSITIVITIES ARE REQUIRED.     MICROCOCCUS SPECIES     Note: Standardized susceptibility testing for this organism is not available.     Note: Gram Stain Report Called to,Read Back By and Verified With: RACHEL [email or web address]  ON [id number] BY [facility]   Report Status 08/05/2013 FINAL   Final  CULTURE, BLOOD (ROUTINE X 2)     Status: None   Collection Time    07/31/13 11:25 AM      Result Value Ref Range Status   Specimen Description BLOOD RIGHT ARM UPPER   Final   Special Requests BOTTLES DRAWN AEROBIC AND ANAEROBIC 5CC   Final   Culture  Setup Time     Final   Value: 07/31/2013 14:25     Performed at [facility]   Culture     Final   Value: DIPHTHEROIDS(CORYNEBACTERIUM SPECIES)     Note: Standardized susceptibility testing for this organism is not available.     Note: Gram Stain Report Called to,Read Back By and Verified With: MONICA CROSS ON 08/01/2013 AT 9:35P BY WILEJ     Performed at [facility]   Report Status 08/03/2013 FINAL   Final  URINE CULTURE     Status: None   Collection Time    07/31/13 12:19 PM      Result Value Ref Range Status   Specimen Description URINE, CLEAN  CATCH   Final   Special Requests NONE   Final   Culture  Setup Time     Final   Value: 07/31/2013 12:54     Performed at [place]     Final   Value: NO GROWTH     Performed at [facility]   Culture     Final   Value: NO GROWTH     Performed at [facility]   Report Status 08/01/2013 FINAL   Final  MRSA PCR SCREENING     Status: None   Collection Time    07/31/13  9:02 PM      Result Value Ref Range Status   MRSA by PCR NEGATIVE  NEGATIVE Final   Comment:            The GeneXpert MRSA Assay (FDA     approved for NASAL specimens     only), is one component of a  comprehensive MRSA colonization     surveillance program. It is not     intended to diagnose MRSA     infection nor to guide or     monitor treatment for     MRSA infections.  URINE CULTURE     Status: None   Collection Time    08/05/13  9:05 AM      Result Value Ref Range Status   Specimen Description URINE, CATHETERIZED   Final   Special Requests Normal   Final   Culture  Setup Time     Final   Value: 08/05/2013 10:47     Performed at [place]     Final   Value: NO GROWTH     Performed at [facility]   Culture     Final   Value: NO GROWTH     Performed at [facility]   Report Status 08/06/2013 FINAL   Final  CULTURE, ROUTINE-ABSCESS     Status: None   Collection Time    08/05/13 12:32 PM      Result Value Ref Range Status   Specimen Description ABSCESS   Final   Special Requests ASPIRATION OF SUBCUTANEOUS FLUID COLLECTION   Final   Gram Stain     Final   Value: ABUNDANT WBC PRESENT,BOTH PMN AND MONONUCLEAR     NO SQUAMOUS EPITHELIAL CELLS SEEN     FEW GRAM POSITIVE COCCI     IN PAIRS     Performed at [facility]   Culture     Final   Value: MULTIPLE ORGANISMS PRESENT, NONE PREDOMINANT     Note: NO STAPHYLOCOCCUS AUREUS ISOLATED NO GROUP A STREP (S.PYOGENES) ISOLATED     Performed at [facility]   Report Status 08/08/2013 FINAL   Final  CULTURE, ROUTINE-ABSCESS     Status: None   Collection Time    08/05/13 12:32 PM      Result Value Ref Range Status   Specimen Description ABSCESS   Final   Special Requests ASPIRATE OF PERIHEPATIC COLLECTION   Final   Gram Stain     Final   Value: ABUNDANT WBC PRESENT,BOTH PMN AND MONONUCLEAR     NO SQUAMOUS EPITHELIAL CELLS SEEN     RARE GRAM POSITIVE COCCI     IN PAIRS     Performed at [facility]   Culture     Final   Value: NO GROWTH 2 DAYS     Performed at [facility]   Report Status PENDING   Incomplete  ANAEROBIC CULTURE     Status: None   Collection Time    08/05/13 12:32 PM      Result Value Ref Range Status   Specimen Description ABSCESS   Final   Special Requests ASPIRATE OF PERIHEPATIC COLLECTION   Final   Gram Stain     Final   Value: ABUNDANT WBC PRESENT,BOTH PMN AND MONONUCLEAR     NO SQUAMOUS EPITHELIAL CELLS SEEN     RARE GRAM POSITIVE COCCI     IN PAIRS     Performed at [facility]   Culture     Final   Value: NO ANAEROBES ISOLATED; CULTURE IN PROGRESS FOR 5 DAYS     Performed at [facility]   Report Status PENDING   Incomplete  ANAEROBIC CULTURE     Status: None   Collection Time    08/05/13 12:32  PM      Result Value Ref Range Status   Specimen Description ABSCESS   Final   Special Requests ASPIRATE OF SUBUTANEOUS FLUID COLLECTION   Final   Gram Stain     Final   Value: ABUNDANT WBC PRESENT,BOTH PMN AND MONONUCLEAR     NO SQUAMOUS EPITHELIAL CELLS SEEN     FEW GRAM POSITIVE COCCI     IN PAIRS     Performed at [facility]   Culture     Final   Value: NO ANAEROBES ISOLATED; CULTURE IN PROGRESS FOR 5 DAYS     Performed at [facility]   Report Status PENDING   Incomplete  MRSA PCR SCREENING     Status: None   Collection Time    08/05/13  5:54 PM      Result Value Ref Range Status   MRSA by PCR NEGATIVE  NEGATIVE Final   Comment:             The GeneXpert MRSA Assay (FDA     approved for NASAL specimens     only), is one component of a     comprehensive MRSA colonization     surveillance program. It is not     intended to diagnose MRSA     infection nor to guide or     monitor treatment for     MRSA infections.  MRSA PCR SCREENING     Status: None   Collection Time    08/08/13  4:58 AM      Result Value Ref Range Status   MRSA by PCR NEGATIVE  NEGATIVE Final   Comment:            The GeneXpert MRSA Assay (FDA     approved for NASAL specimens     only), is one component of a     comprehensive MRSA colonization     surveillance program. It is not     intended to diagnose MRSA     infection nor to guide or     monitor treatment for     MRSA infections.    Studies/Results: Ir Ivc Filter Plmt / S&i /img Guid/mod Sed  08/08/2013   CLINICAL DATA:  Bilateral DVT, patient cannot be anticoagulated because of diffuse hemoperitoneum within the abdomen and pelvis following left hepatic mass biopsy  EXAM: ULTRASOUND GUIDANCE FOR VASCULAR ACCESS  IVC CATHETERIZATION AND VENOGRAM  IVC FILTER INSERTION  Date:  4/9/20154/12/2013 12:57 PM  Radiologist:  Jerilynn Mages. Daryll Brod, MD  Guidance:  Ultrasound fluoroscopic  FLUOROSCOPY TIME:  1 min 36 seconds  MEDICATIONS AND MEDICAL HISTORY: 3 mg Versed, 100 mcg fentanyl  ANESTHESIA/SEDATION: 15 min  CONTRAST:  51mL OMNIPAQUE IOHEXOL 300 MG/ML  SOLN  COMPLICATIONS: No immediate  PROCEDURE: Informed consent was obtained from the patient following explanation of the procedure, risks, benefits and alternatives. The patient understands, agrees and consents for the procedure. All questions were addressed. A time out was performed.  Maximal barrier sterile technique utilized including caps, mask, sterile gowns, sterile gloves, large sterile drape, hand hygiene, and betadine prep.  Under sterile condition and local anesthesia, right internal jugular venous access was performed with ultrasound. Over a guide wire, the  IVC filter delivery sheath and inner dilator were advanced into the IVC just above the IVC bifurcation. Contrast injection was performed for an IVC venogram.  IVC VENOGRAM: The IVC is patent. No evidence of thrombus, stenosis, or occlusion. No variant venous anatomy. The renal  veins are identified at L1.  IVC FILTER INSERTION: Through the delivery sheath, the Bard Denali IVC filter was deployed in the infrarenal IVC at the L2-3 level just below the renal veins and above the IVC bifurcation. Contrast injection confirmed position. There is good apposition of the filter against the IVC.  The delivery sheath was removed and hemostasis was obtained with compression for 5 minutes. The patient tolerated the procedure well. No immediate complications.  IMPRESSION: Ultrasound and fluoroscopically guided infrarenal IVC filter insertion.   Electronically Signed   By: Daryll Brod M.D.   On: 08/08/2013 14:01      Assessment/Plan:  Principal Problem:   Severe sepsis with septic shock Active Problems:   HYPERTENSION, BENIGN ESSENTIAL   Diet-controlled type 2 diabetes mellitus   Intraabdominal fluid collection   Abscess of abdominal cavity   Acute renal failure   Diverticulitis   Liver abscess   Sepsis(995.91)   SBO (small bowel obstruction)    Leah Peterson is a 58 y.o. female with female with diverticultiis and then  Liver abscess likely with Enterococcus vs Strep species +/- anerobe who also has PCN allergy  #1 Liver abscess: one culture with mixed bacteria, the other with NGTD  IF we have no growth from either cutlure to pin point a predominant organism then we are stuck covering both AMP S and AMP R enterococcus  And the patient will need both prolonged Vancomycin and Imipenem  --would continue both for at least a month AND until we have followup CT showing resolution of her liver abscess   I will followup cultures but otherwise sign off and will arrange HSFU in my clinic  Please call  with further quesitons.   ADDENDUM: WE HAVE A FEW MICRO-AEROPHILLIC STREPTOCOCCI GROWING FROM ONE CULTURE AND MIXED FLORA FROM THE OTHER  THEREFORE DC THE VANCOMYCIN  AND CONTINUE WITH CARBAPENEM FOR BROAD SPECTRUM COVERAGE, CAN CHANGE TO IV INVANZ SINCE ENTEROCOCCAL COVERAGE IS NO LONGER SPECIFICALLY NEEDED HERE AND DOSING CONVENIENCE OF 1 GRAM DAILY  CONTINUE INVANZ FOR ONE MONTH WITH REPEAT IMAGING PRIOR TO [facility] IV ABX    LOS: 8 days   Truman Hayward 08/08/2013, 2:25 PM

## 2013-08-08 NOTE — Progress Notes (Addendum)
Nutrition Brief Note  Patient identified on the Malnutrition Screening Tool (MST) for eating poorly because of a decreased appetite.  Current diet order is Full Liquids.  Patient reports a good appetite; anxious to eat solids.  Labs and medications reviewed.   Wt Readings from Last 15 Encounters:  08/05/13 309 lb 15.5 oz (140.6 kg)  02/13/13 310 lb 8 oz (140.842 kg)  02/08/13 311 lb (141.069 kg)  09/19/12 320 lb 8 oz (145.378 kg)  09/14/11 306 lb 4 oz (138.914 kg)  09/08/10 318 lb 12 oz (144.584 kg)  02/10/10 321 lb 12 oz (145.945 kg)  10/28/09 320 lb 8 oz (145.378 kg)  09/02/09 318 lb (144.244 kg)  12/31/08 321 lb (145.605 kg)  11/19/08 320 lb (145.151 kg)  10/08/08 315 lb (142.883 kg)  08/27/08 305 lb (138.347 kg)  04/09/08 294 lb (133.358 kg)  04/02/08 306 lb (138.801 kg)    Body mass index is 58.6 kg/(m^2). Patient meets criteria for Obesity Class III based on current BMI.   No nutrition interventions warranted at this time. If nutrition issues arise, please consult RD.   Arthur Holms, RD, LDN Pager #: [phone] After-Hours Pager #: [phone]

## 2013-08-09 DIAGNOSIS — R188 Other ascites: Secondary | ICD-10-CM

## 2013-08-09 DIAGNOSIS — N19 Unspecified kidney failure: Secondary | ICD-10-CM

## 2013-08-09 LAB — BASIC METABOLIC PANEL
BUN: 11 mg/dL (ref 6–23)
CO2: 24 meq/L (ref 19–32)
CREATININE: 1.11 mg/dL — AB (ref 0.50–1.10)
Calcium: 8 mg/dL — ABNORMAL LOW (ref 8.4–10.5)
Chloride: 102 mEq/L (ref 96–112)
GFR calc non Af Amer: 55 mL/min — ABNORMAL LOW (ref >90)
GFR, EST AFRICAN AMERICAN: 64 mL/min — AB (ref >90)
Glucose, Bld: 91 mg/dL (ref 70–99)
Potassium: 3.8 mEq/L (ref 3.7–5.3)
SODIUM: 140 meq/L (ref 137–147)

## 2013-08-09 LAB — CBC
HCT: 24.9 % — ABNORMAL LOW (ref 36.0–46.0)
Hemoglobin: 8.2 g/dL — ABNORMAL LOW (ref 12.0–15.0)
MCH: 26.5 pg (ref 26.0–34.0)
MCHC: 32.9 g/dL (ref 30.0–36.0)
MCV: 80.6 fL (ref 78.0–100.0)
PLATELETS: 251 10*3/uL (ref 150–400)
RBC: 3.09 MIL/uL — AB (ref 3.87–5.11)
RDW: 16.1 % — AB (ref 11.5–15.5)
WBC: 16.6 10*3/uL — ABNORMAL HIGH (ref 4.0–10.5)

## 2013-08-09 LAB — TYPE AND SCREEN
ABO/RH(D): B POS
Antibody Screen: NEGATIVE
UNIT DIVISION: 0
UNIT DIVISION: 0
Unit division: 0

## 2013-08-09 NOTE — Progress Notes (Signed)
Sweet lady, can go home from surgical standpoint.  Longterm IV antibiotics.  [name]. Dahlia Bailiff, MD, [place] [phone] [facility]

## 2013-08-09 NOTE — Progress Notes (Signed)
Subjective: Perihepatic abscess drain placed 4/6 Better daily Up in chair  Objective: Vital signs in last 24 hours: Temp:  [98.5 F (36.9 C)-99.9 F (37.7 C)] 98.8 F (37.1 C) (04/10 0314) Pulse Rate:  [86-93] 87 (04/10 0314) Resp:  [20-31] 20 (04/10 0314) BP: (136-154)/(83-99) 152/89 mmHg (04/10 0314) SpO2:  [97 %-100 %] 99 % (04/10 0314) Last BM Date: 08/09/13  Intake/Output from previous day: 04/09 0701 - 04/10 0700 In: 13  Out: 40 [Drains:40] Intake/Output this shift:    PE:  Afeb; vss Wbc 16.6 (23) Cx + microaerophilic strept Output 40 cc 4/9 Output blood tinged with infectious flecks Site clean and dry; NT  no bleeding RN flushing 2-3 x/day per pt  Lab Results:   Recent Labs  08/08/13 0251 08/09/13 0500  WBC 23.0* 16.6*  HGB 8.4* 8.2*  HCT 25.5* 24.9*  PLT 240 251   BMET  Recent Labs  08/08/13 0855 08/09/13 0500  NA 141 140  K 3.7 3.8  CL 104 102  CO2 24 24  GLUCOSE 99 91  BUN 14 11  CREATININE 1.18* 1.11*  CALCIUM 8.0* 8.0*   PT/INR No results found for this basename: LABPROT, INR,  in the last 72 hours ABG No results found for this basename: PHART, PCO2, PO2, HCO3,  in the last 72 hours  Studies/Results: Ir Ivc Filter Plmt / S&i /img Guid/mod Sed  08/08/2013   CLINICAL DATA:  Bilateral DVT, patient cannot be anticoagulated because of diffuse hemoperitoneum within the abdomen and pelvis following left hepatic mass biopsy  EXAM: ULTRASOUND GUIDANCE FOR VASCULAR ACCESS  IVC CATHETERIZATION AND VENOGRAM  IVC FILTER INSERTION  Date:  4/9/20154/12/2013 12:57 PM  Radiologist:  Jerilynn Mages. Daryll Brod, MD  Guidance:  Ultrasound fluoroscopic  FLUOROSCOPY TIME:  1 min 36 seconds  MEDICATIONS AND MEDICAL HISTORY: 3 mg Versed, 100 mcg fentanyl  ANESTHESIA/SEDATION: 15 min  CONTRAST:  10mL OMNIPAQUE IOHEXOL 300 MG/ML  SOLN  COMPLICATIONS: No immediate  PROCEDURE: Informed consent was obtained from the patient following explanation of the procedure, risks,  benefits and alternatives. The patient understands, agrees and consents for the procedure. All questions were addressed. A time out was performed.  Maximal barrier sterile technique utilized including caps, mask, sterile gowns, sterile gloves, large sterile drape, hand hygiene, and betadine prep.  Under sterile condition and local anesthesia, right internal jugular venous access was performed with ultrasound. Over a guide wire, the IVC filter delivery sheath and inner dilator were advanced into the IVC just above the IVC bifurcation. Contrast injection was performed for an IVC venogram.  IVC VENOGRAM: The IVC is patent. No evidence of thrombus, stenosis, or occlusion. No variant venous anatomy. The renal veins are identified at L1.  IVC FILTER INSERTION: Through the delivery sheath, the Bard Denali IVC filter was deployed in the infrarenal IVC at the L2-3 level just below the renal veins and above the IVC bifurcation. Contrast injection confirmed position. There is good apposition of the filter against the IVC.  The delivery sheath was removed and hemostasis was obtained with compression for 5 minutes. The patient tolerated the procedure well. No immediate complications.  IMPRESSION: Ultrasound and fluoroscopically guided infrarenal IVC filter insertion.   Electronically Signed   By: Daryll Brod M.D.   On: 08/08/2013 14:01    Anti-infectives: Anti-infectives   Start     Dose/Rate Route Frequency Ordered Stop   08/08/13 1800  ertapenem (INVANZ) 1 g in sodium chloride 0.9 % 50 mL IVPB  1 g 100 mL/hr over 30 Minutes Intravenous Every 24 hours 08/08/13 1712     08/07/13 2000  imipenem-cilastatin (PRIMAXIN) 500 mg in sodium chloride 0.9 % 100 mL IVPB  Status:  Discontinued     500 mg 200 mL/hr over 30 Minutes Intravenous 4 times per day 08/07/13 1608 08/08/13 1712   08/07/13 1800  imipenem-cilastatin (PRIMAXIN) 500 mg in sodium chloride 0.9 % 100 mL IVPB  Status:  Discontinued     500 mg 200 mL/hr over  30 Minutes Intravenous 4 times per day 08/07/13 1604 08/07/13 1608   08/07/13 1615  vancomycin (VANCOCIN) IVPB 1000 mg/200 mL premix  Status:  Discontinued     1,000 mg 200 mL/hr over 60 Minutes Intravenous Every 12 hours 08/07/13 1606 08/08/13 1709   08/07/13 1300  metroNIDAZOLE (FLAGYL) IVPB 500 mg  Status:  Discontinued     500 mg 100 mL/hr over 60 Minutes Intravenous Every 8 hours 08/07/13 1201 08/07/13 1535   08/07/13 1215  ciprofloxacin (CIPRO) IVPB 400 mg  Status:  Discontinued     400 mg 200 mL/hr over 60 Minutes Intravenous Every 12 hours 08/07/13 1201 08/07/13 1535   08/07/13 1200  imipenem-cilastatin (PRIMAXIN) 500 mg in sodium chloride 0.9 % 100 mL IVPB  Status:  Discontinued     500 mg 200 mL/hr over 30 Minutes Intravenous 4 times per day 08/07/13 1132 08/07/13 1157   08/07/13 0600  vancomycin (VANCOCIN) IVPB 1000 mg/200 mL premix  Status:  Discontinued     1,000 mg 200 mL/hr over 60 Minutes Intravenous Every 12 hours 08/06/13 1802 08/07/13 0918   08/06/13 1815  vancomycin (VANCOCIN) IVPB 1000 mg/200 mL premix     1,000 mg 200 mL/hr over 60 Minutes Intravenous NOW 08/06/13 1802 08/06/13 1945   08/04/13 0500  vancomycin (VANCOCIN) 1,250 mg in sodium chloride 0.9 % 250 mL IVPB  Status:  Discontinued     1,250 mg 166.7 mL/hr over 90 Minutes Intravenous Every 12 hours 08/03/13 1507 08/06/13 1802   08/02/13 1200  levofloxacin (LEVAQUIN) IVPB 500 mg  Status:  Discontinued     500 mg 100 mL/hr over 60 Minutes Intravenous Every 48 hours 07/31/13 1812 07/31/13 2002   08/02/13 1000  ciprofloxacin (CIPRO) IVPB 400 mg  Status:  Discontinued     400 mg 200 mL/hr over 60 Minutes Intravenous Every 12 hours 08/02/13 0911 08/07/13 1002   08/02/13 0400  vancomycin (VANCOCIN) 1,750 mg in sodium chloride 0.9 % 500 mL IVPB  Status:  Discontinued     1,750 mg 250 mL/hr over 120 Minutes Intravenous Every 24 hours 08/02/13 0357 08/03/13 1507   08/01/13 1000  ciprofloxacin (CIPRO) IVPB 400 mg   Status:  Discontinued     400 mg 200 mL/hr over 60 Minutes Intravenous Every 24 hours 08/01/13 0959 08/02/13 0911   07/31/13 2015  vancomycin (VANCOCIN) 2,500 mg in sodium chloride 0.9 % 500 mL IVPB     2,500 mg 250 mL/hr over 120 Minutes Intravenous  Once 07/31/13 2000 07/31/13 2346   07/31/13 2000  metroNIDAZOLE (FLAGYL) IVPB 500 mg  Status:  Discontinued     500 mg 100 mL/hr over 60 Minutes Intravenous Every 8 hours 07/31/13 1812 08/07/13 1005   07/31/13 1830  aztreonam (AZACTAM) 1 g in dextrose 5 % 50 mL IVPB  Status:  Discontinued     1 g 100 mL/hr over 30 Minutes Intravenous Every 8 hours 07/31/13 1815 08/01/13 0958   07/31/13 1130  levofloxacin (LEVAQUIN) IVPB 750  mg     750 mg 100 mL/hr over 90 Minutes Intravenous  Once 07/31/13 1123 07/31/13 1310   07/31/13 1130  metroNIDAZOLE (FLAGYL) IVPB 500 mg     500 mg 100 mL/hr over 60 Minutes Intravenous  Once 07/31/13 1123 07/31/13 1310      Assessment/Plan: s/p * No surgery found *  Perihep abscess drain intact Output still significant When less than 15 cc/day Will need re CT to eval abscess before pull   LOS: 9 days    Lavonia Drafts 08/09/2013

## 2013-08-09 NOTE — Evaluation (Signed)
Physical Therapy Evaluation Patient Details Name: Leah Peterson MRN: [id number] DOB: 1955-07-04 Today's Date: 08/09/2013   History of Present Illness  Adm 4/1 with abd pain; found to have a liver mass and underwent biopsy with resulting perihepatic hematoma. Had perihepatic abscess drain placed. + for bil DVTs and had IVC filter placed 4/9  Clinical Impression  Patient evaluated by Physical Therapy with no further acute PT needs identified. All education has been completed and the patient has no further questions.  PT is signing off. Thank you for this referral.     Follow Up Recommendations No PT follow up    Equipment Recommendations  None recommended by PT    Recommendations for Other Services       Precautions / Restrictions Precautions Precautions: Other (comment) (abd drain) Restrictions Weight Bearing Restrictions: No      Mobility  Bed Mobility                  Transfers Overall transfer level: Modified independent Equipment used: None             General transfer comment: uses armrests and momentum  Ambulation/Gait Ambulation/Gait assistance: Modified independent (Device/Increase time) Ambulation Distance (Feet): 100 Feet Assistive device: None Gait Pattern/deviations: Step-through pattern;Wide base of support (incr lateral sway due to body habitus) Gait velocity: decr due to dyspnea      Stairs            Wheelchair Mobility    Modified Rankin (Stroke Patients Only)       Balance Overall balance assessment: No apparent balance deficits (not formally assessed)                                           Pertinent Vitals/Pain Denied pain Dyspnea 3/4; SaO2 97-98 % on RA with ambulation     Home Living Family/patient expects to be discharged to:: Private residence Living Arrangements: Alone Available Help at Discharge: Family;Available PRN/intermittently (daughter) Type of Home: House Home Access:  Stairs to enter Entrance Stairs-Rails: None (items nearby, but no rail) Entrance Stairs-Number of Steps: 2 Home Layout: Two level;Able to live on main level with bedroom/bathroom (upstairs office) Home Equipment: Shower seat - built in;Hand held shower head (walkin shower; one shower has seat, another shower has hand )      Prior Function Level of Independence: Independent               Hand Dominance        Extremity/Trunk Assessment   Upper Extremity Assessment: Overall WFL for tasks assessed           Lower Extremity Assessment: Overall WFL for tasks assessed      Cervical / Trunk Assessment: Normal  Communication   Communication: No difficulties  Cognition Arousal/Alertness: Awake/alert Behavior During Therapy: WFL for tasks assessed/performed Overall Cognitive Status: Within Functional Limits for tasks assessed                      General Comments General comments (skin integrity, edema, etc.): Educated on elevating legs and ankle pumps to reduce edema bil legs. Educated on appropriate activity progression including incr time OOB and walking in hall tid with nursing assist (until they OK her to ambulate alone)    Exercises General Exercises - Lower Extremity Ankle Circles/Pumps: AROM;Both;10 reps;Seated      Assessment/Plan  PT Assessment Patent does not need any further PT services  PT Diagnosis     PT Problem List    PT Treatment Interventions     PT Goals (Current goals can be found in the Care Plan section) Acute Rehab PT Goals PT Goal Formulation: No goals set, d/c therapy    Frequency     Barriers to discharge        Co-evaluation               End of Session   Activity Tolerance: Patient limited by fatigue Patient left: in chair;with call bell/phone within reach Nurse Communication: Mobility status;Other (comment) (d/c from PT; ambulate tid)         Time: 3007-6226 PT Time Calculation (min): 15  min   Charges:   PT Evaluation $Initial PT Evaluation Tier I: 1 Procedure     PT G CodesJeanie Peterson Leah Peterson 2013/09/05, 9:14 AM Pager [phone]

## 2013-08-09 NOTE — Progress Notes (Signed)
Patient ID: Leah Peterson, female   DOB: [birth year], 58 y.o.   MRN: [id number]   Subjective: Having BMs, tolerating a heart healthy diet.  Says she had "rumbling" but no pain.  Shes sitting up and eating breakfast.  NAD.  White count is trending down.  Afebrile.    Objective:  Vital signs:  Filed Vitals:   08/08/13 1256 08/08/13 1547 08/08/13 2059 08/09/13 0314  BP: 142/88 140/94 139/83 152/89  Pulse: 89 86 93 87  Temp:  98.5 F (36.9 C) 99.9 F (37.7 C) 98.8 F (37.1 C)  TempSrc:  Oral Oral Oral  Resp: _0 Height:      Weight:      SpO2: 98% 97% 99% 99%    Last BM Date: 08/08/13  Intake/Output   Yesterday:  04/09 0701 - 04/10 0700 In: 13  Out: 40 [Drains:40] This shift:    I/O last 3 completed shifts: In: 61 [Other:23] Out: 75 [Drains:75]    Physical Exam: General: Pt awake/alert/oriented x4 in no acute distress Abdomen: Soft.  Nondistended. Non tender.  Drain with serosanguineous output.   No evidence of peritonitis.  No incarcerated hernias.   Problem List:   Principal Problem:   Severe sepsis with septic shock Active Problems:   HYPERTENSION, BENIGN ESSENTIAL   Diet-controlled type 2 diabetes mellitus   Intraabdominal fluid collection   Abscess of abdominal cavity   Acute renal failure   Diverticulitis   Liver abscess   Sepsis(995.91)   SBO (small bowel obstruction)    Results:   Labs: Results for orders placed during the hospital encounter of 07/31/13 (from the past 48 hour(s))  TSH     Status: None   Collection Time    08/07/13  9:00 AM      Result Value Ref Range   TSH 4.460  0.350 - 4.500 uIU/mL   Comment: Please note change in reference range.  TROPONIN I     Status: None   Collection Time    08/07/13 10:10 AM      Result Value Ref Range   Troponin I <0.30  <0.30 ng/mL   Comment:            Due to the release kinetics of cTnI,     a negative result within the first hours     of the onset of symptoms does not rule out      myocardial infarction with certainty.     If myocardial infarction is still suspected,     repeat the test at appropriate intervals.  TROPONIN I     Status: None   Collection Time    08/07/13  3:00 PM      Result Value Ref Range   Troponin I <0.30  <0.30 ng/mL   Comment:            Due to the release kinetics of cTnI,     a negative result within the first hours     of the onset of symptoms does not rule out     myocardial infarction with certainty.     If myocardial infarction is still suspected,     repeat the test at appropriate intervals.  CBC     Status: Abnormal   Collection Time    08/08/13  2:51 AM      Result Value Ref Range   WBC 23.0 (*) 4.0 - 10.5 K/uL   RBC 3.17 (*) 3.87 - 5.11 MIL/uL   Hemoglobin  8.4 (*) 12.0 - 15.0 g/dL   HCT 25.5 (*) 36.0 - 46.0 %   MCV 80.4  78.0 - 100.0 fL   MCH 26.5  26.0 - 34.0 pg   MCHC 32.9  30.0 - 36.0 g/dL   RDW 16.0 (*) 11.5 - 15.5 %   Platelets 240  150 - 400 K/uL  MRSA PCR SCREENING     Status: None   Collection Time    08/08/13  4:58 AM      Result Value Ref Range   MRSA by PCR NEGATIVE  NEGATIVE   Comment:            The GeneXpert MRSA Assay (FDA     approved for NASAL specimens     only), is one component of a     comprehensive MRSA colonization     surveillance program. It is not     intended to diagnose MRSA     infection nor to guide or     monitor treatment for     MRSA infections.  BASIC METABOLIC PANEL     Status: Abnormal   Collection Time    08/08/13  8:55 AM      Result Value Ref Range   Sodium 141  137 - 147 mEq/L   Potassium 3.7  3.7 - 5.3 mEq/L   Chloride 104  96 - 112 mEq/L   CO2 24  19 - 32 mEq/L   Glucose, Bld 99  70 - 99 mg/dL   BUN 14  6 - 23 mg/dL   Creatinine, Ser 1.18 (*) 0.50 - 1.10 mg/dL   Calcium 8.0 (*) 8.4 - 10.5 mg/dL   GFR calc non Af Amer 51 (*) >90 mL/min   GFR calc Af Amer 59 (*) >90 mL/min   Comment: (NOTE)     The eGFR has been calculated using the CKD EPI equation.     This  calculation has not been validated in all clinical situations.     eGFR's persistently <90 mL/min signify possible Chronic Kidney     Disease.  CBC     Status: Abnormal   Collection Time    08/09/13  5:00 AM      Result Value Ref Range   WBC 16.6 (*) 4.0 - 10.5 K/uL   RBC 3.09 (*) 3.87 - 5.11 MIL/uL   Hemoglobin 8.2 (*) 12.0 - 15.0 g/dL   HCT 24.9 (*) 36.0 - 46.0 %   MCV 80.6  78.0 - 100.0 fL   MCH 26.5  26.0 - 34.0 pg   MCHC 32.9  30.0 - 36.0 g/dL   RDW 16.1 (*) 11.5 - 15.5 %   Platelets 251  150 - 400 K/uL  BASIC METABOLIC PANEL     Status: Abnormal   Collection Time    08/09/13  5:00 AM      Result Value Ref Range   Sodium 140  137 - 147 mEq/L   Potassium 3.8  3.7 - 5.3 mEq/L   Chloride 102  96 - 112 mEq/L   CO2 24  19 - 32 mEq/L   Glucose, Bld 91  70 - 99 mg/dL   BUN 11  6 - 23 mg/dL   Creatinine, Ser 1.11 (*) 0.50 - 1.10 mg/dL   Calcium 8.0 (*) 8.4 - 10.5 mg/dL   GFR calc non Af Amer 55 (*) >90 mL/min   GFR calc Af Amer 64 (*) >90 mL/min   Comment: (NOTE)     The eGFR  has been calculated using the CKD EPI equation.     This calculation has not been validated in all clinical situations.     eGFR's persistently <90 mL/min signify possible Chronic Kidney     Disease.    Imaging / Studies: Ir Ivc Filter Plmt / S&i /img Guid/mod Sed  08/08/2013   CLINICAL DATA:  Bilateral DVT, patient cannot be anticoagulated because of diffuse hemoperitoneum within the abdomen and pelvis following left hepatic mass biopsy  EXAM: ULTRASOUND GUIDANCE FOR VASCULAR ACCESS  IVC CATHETERIZATION AND VENOGRAM  IVC FILTER INSERTION  Date:  4/9/20154/12/2013 12:57 PM  Radiologist:  Jerilynn Mages. Daryll Brod, MD  Guidance:  Ultrasound fluoroscopic  FLUOROSCOPY TIME:  1 min 36 seconds  MEDICATIONS AND MEDICAL HISTORY: 3 mg Versed, 100 mcg fentanyl  ANESTHESIA/SEDATION: 15 min  CONTRAST:  59m OMNIPAQUE IOHEXOL 300 MG/ML  SOLN  COMPLICATIONS: No immediate  PROCEDURE: Informed consent was obtained from the patient  following explanation of the procedure, risks, benefits and alternatives. The patient understands, agrees and consents for the procedure. All questions were addressed. A time out was performed.  Maximal barrier sterile technique utilized including caps, mask, sterile gowns, sterile gloves, large sterile drape, hand hygiene, and betadine prep.  Under sterile condition and local anesthesia, right internal jugular venous access was performed with ultrasound. Over a guide wire, the IVC filter delivery sheath and inner dilator were advanced into the IVC just above the IVC bifurcation. Contrast injection was performed for an IVC venogram.  IVC VENOGRAM: The IVC is patent. No evidence of thrombus, stenosis, or occlusion. No variant venous anatomy. The renal veins are identified at L1.  IVC FILTER INSERTION: Through the delivery sheath, the Bard Denali IVC filter was deployed in the infrarenal IVC at the L2-3 level just below the renal veins and above the IVC bifurcation. Contrast injection confirmed position. There is good apposition of the filter against the IVC.  The delivery sheath was removed and hemostasis was obtained with compression for 5 minutes. The patient tolerated the procedure well. No immediate complications.  IMPRESSION: Ultrasound and fluoroscopically guided infrarenal IVC filter insertion.   Electronically Signed   By: TDaryll BrodM.D.   On: 08/08/2013 14:01    Medications / Allergies: per chart  Antibiotics: Anti-infectives   Start     Dose/Rate Route Frequency Ordered Stop   08/08/13 1800  ertapenem (INVANZ) 1 g in sodium chloride 0.9 % 50 mL IVPB     1 g 100 mL/hr over 30 Minutes Intravenous Every 24 hours 08/08/13 1712     08/07/13 2000  imipenem-cilastatin (PRIMAXIN) 500 mg in sodium chloride 0.9 % 100 mL IVPB  Status:  Discontinued     500 mg 200 mL/hr over 30 Minutes Intravenous 4 times per day 08/07/13 1608 08/08/13 1712   08/07/13 1800  imipenem-cilastatin (PRIMAXIN) 500 mg in  sodium chloride 0.9 % 100 mL IVPB  Status:  Discontinued     500 mg 200 mL/hr over 30 Minutes Intravenous 4 times per day 08/07/13 1604 08/07/13 1608   08/07/13 1615  vancomycin (VANCOCIN) IVPB 1000 mg/200 mL premix  Status:  Discontinued     1,000 mg 200 mL/hr over 60 Minutes Intravenous Every 12 hours 08/07/13 1606 08/08/13 1709   08/07/13 1300  metroNIDAZOLE (FLAGYL) IVPB 500 mg  Status:  Discontinued     500 mg 100 mL/hr over 60 Minutes Intravenous Every 8 hours 08/07/13 1201 08/07/13 1535   08/07/13 1215  ciprofloxacin (CIPRO) IVPB 400 mg  Status:  Discontinued     400 mg 200 mL/hr over 60 Minutes Intravenous Every 12 hours 08/07/13 1201 08/07/13 1535   08/07/13 1200  imipenem-cilastatin (PRIMAXIN) 500 mg in sodium chloride 0.9 % 100 mL IVPB  Status:  Discontinued     500 mg 200 mL/hr over 30 Minutes Intravenous 4 times per day 08/07/13 1132 08/07/13 1157   08/07/13 0600  vancomycin (VANCOCIN) IVPB 1000 mg/200 mL premix  Status:  Discontinued     1,000 mg 200 mL/hr over 60 Minutes Intravenous Every 12 hours 08/06/13 1802 08/07/13 0918   08/06/13 1815  vancomycin (VANCOCIN) IVPB 1000 mg/200 mL premix     1,000 mg 200 mL/hr over 60 Minutes Intravenous NOW 08/06/13 1802 08/06/13 1945   08/04/13 0500  vancomycin (VANCOCIN) 1,250 mg in sodium chloride 0.9 % 250 mL IVPB  Status:  Discontinued     1,250 mg 166.7 mL/hr over 90 Minutes Intravenous Every 12 hours 08/03/13 1507 08/06/13 1802   08/02/13 1200  levofloxacin (LEVAQUIN) IVPB 500 mg  Status:  Discontinued     500 mg 100 mL/hr over 60 Minutes Intravenous Every 48 hours 07/31/13 1812 07/31/13 2002   08/02/13 1000  ciprofloxacin (CIPRO) IVPB 400 mg  Status:  Discontinued     400 mg 200 mL/hr over 60 Minutes Intravenous Every 12 hours 08/02/13 0911 08/07/13 1002   08/02/13 0400  vancomycin (VANCOCIN) 1,750 mg in sodium chloride 0.9 % 500 mL IVPB  Status:  Discontinued     1,750 mg 250 mL/hr over 120 Minutes Intravenous Every 24 hours  08/02/13 0357 08/03/13 1507   08/01/13 1000  ciprofloxacin (CIPRO) IVPB 400 mg  Status:  Discontinued     400 mg 200 mL/hr over 60 Minutes Intravenous Every 24 hours 08/01/13 0959 08/02/13 0911   07/31/13 2015  vancomycin (VANCOCIN) 2,500 mg in sodium chloride 0.9 % 500 mL IVPB     2,500 mg 250 mL/hr over 120 Minutes Intravenous  Once 07/31/13 2000 07/31/13 2346   07/31/13 2000  metroNIDAZOLE (FLAGYL) IVPB 500 mg  Status:  Discontinued     500 mg 100 mL/hr over 60 Minutes Intravenous Every 8 hours 07/31/13 1812 08/07/13 1005   07/31/13 1830  aztreonam (AZACTAM) 1 g in dextrose 5 % 50 mL IVPB  Status:  Discontinued     1 g 100 mL/hr over 30 Minutes Intravenous Every 8 hours 07/31/13 1815 08/01/13 0958   07/31/13 1130  levofloxacin (LEVAQUIN) IVPB 750 mg     750 mg 100 mL/hr over 90 Minutes Intravenous  Once 07/31/13 1123 07/31/13 1310   07/31/13 1130  metroNIDAZOLE (FLAGYL) IVPB 500 mg     500 mg 100 mL/hr over 60 Minutes Intravenous  Once 07/31/13 1123 07/31/13 1310      Assessment/Plan Diverticulitis liver mass s/p biopsy, inflammation with necrosis Perihepatic fluid collection s/p drain placement -Will need outpatient GI follow up, colonoscopy  -IV antibiotics(Vanc and Imipenem per ID) -Will need a repeat CT Scan -surgery is signing off, please do not hesitate to contact [facility] with any questions or concerns.     Erby Pian, [facility] Surgery Pager [phone] Office [phone]  08/09/2013 8:14 AM

## 2013-08-09 NOTE — Progress Notes (Signed)
[facility] PROGRESS NOTE  Leah Peterson XNA:[id number] DOB: July 04, 1955 DOA: 07/31/2013 PCP: Ria Bush, MD  Assessment/Plan: 1. Sepsis, present on admission -Patient is she hypotensive, acute renal failure -Likely secondary to liver abscess/diverticulitis/sigmoid fistula with abscess -Resolved -Will require at least one month of IV antimicrobial therapy 2   Abdominal subcutaneous abscess -Status post aspiration -Cultures growing multiple organisms 3.  Liver abscess -Status post aspiration -Cytology showing acute inflammation -Hepatic fluid culture growing microaerophilic streptococci -ID consulted, plan for at least one month of vancomycin and imipenem -Will need repeat CT scan to ensure resolution of liver abscess 4. Bilateral lower extremity DVTs -Holding anticoagulation -Status post IVC filter placement on 08/09/2013 -Will need to initiate anticoagulation when cleared by surgery 5. Hepatic subcapsular hematoma -Transfuse for hemoglobin less than 7 -Anticoagulation held 6.  Acute kidney injury -Creatinine peaking at 4.36, secondary to sepsis -Resolved 7. Type 2 diabetes mellitus -Accu-Cheks q. a.c. each bedtime was less than coverage  Code Status: Full code Family Communication:  Disposition Plan: Patient will require long-term antibiotic therapy   Consultants:  Pulmonary critical care medicine  General surgery  Infectious disease  Procedures: JP abdominal drain 4/6 >>>  R IJ CVL 4/2 >>> 4/8  OGT 4/1 >>> 4/4  Foley 4/1 >>>  Right PICC 4/9 >>>   Antibiotics:  Vancomycin  Ertapenem  HPI/Subjective: Patient is a pleasant 58 year old female with a past medical history of diverticulitis who was admitted to the pulmonary critical care service on 07/31/2013 presenting with complaints of abdominal pain and diarrhea. She had a CT scan on admission that showed a 5.5 cm collection in the left lower pelvis contiguous with the uterus. There was  concerns that this could reflect. Diverticular abscess. Also noted was a hypoattenuating mass like lesion in the lateral segment of the left lobe of the liver measuring 9.3 cm in greatest dimension. Patient's initially was septic and thus admitted to the intensive care unit. General surgery was consulted.  Interventional radiology was also consulted as she underwent core biopsy of left lower lobe mass, aspiration of subcutaneous fluid collection and percutaneous drainage of perihepatic fluid. A 12 French drain to suction bulb left in place. Liver biopsy was negative for malignancy. Patient's hospitalization complicated by the development of deep venous thrombosis as venous duplex performed on 08/07/2013 showed deep venous thrombosis involving right posterior tibial vein and left posterior to tibial veins. She was not started on anticoagulation due to hepatic sub-capsular hematoma. Patient undergoing IVC filter placement on 08/09/2013. Cultures  from perihepatic aspirate drawn 08/05/2013 growing Microaerophilic streptococci. Culture from subcutaneous aspiration grew multiple organisms, no staph aureus noted. Infectious disease consulted. ID recommended at least 1 month treatment with vancomycin and imipenem.   Objective: Filed Vitals:   08/09/13 0314  BP: 152/89  Pulse: 87  Temp: 98.8 F (37.1 C)  Resp: 20    Intake/Output Summary (Last 24 hours) at 08/09/13 1733 Last data filed at 08/09/13 0610  Gross per 24 hour  Intake     13 ml  Output     40 ml  Net    -27 ml   Filed Weights   07/31/13 1113 08/05/13 1800  Weight: 127.007 kg (280 lb) 140.6 kg (309 lb 15.5 oz)    Exam:   General:  No acute distress, patient sitting at bedside chair, appears comfortable  Cardiovascular: Regular rate rhythm normal S1-S2  Respiratory: Clear to auscultation bilaterally no wheezing rhonchi overall  Abdomen: Obese soft nontender nondistended, JP drain in place  Musculoskeletal: No edema  Data  Reviewed: Basic Metabolic Panel:  Recent Labs Lab 08/03/13 0539 08/04/13 0500  08/06/13 0240 08/06/13 1703 08/07/13 0410 08/08/13 0855 08/09/13 0500  NA 143 142  < > 142 143 142 141 140  K 3.2* 3.5*  < > 3.7 3.6* 3.6* 3.7 3.8  CL 104 103  < > 105 107 107 104 102  CO2 23 25  < > 24 23 23 24 24   GLUCOSE 122* 120*  < > 112* 98 118* 99 91  BUN 29* 21  < > 24* 24* 21 14 11   CREATININE 1.27* 0.99  < > 1.19* 1.17* 1.14* 1.18* 1.11*  CALCIUM 8.1* 8.3*  < > 7.2* 7.8* 7.7* 8.0* 8.0*  MG 2.2 2.1  --  1.8 1.9 1.7  --   --   PHOS 3.1  --   --  4.0 2.9  --   --   --   < > = values in this interval not displayed. Liver Function Tests:  Recent Labs Lab 08/04/13 0500 08/05/13 0500 08/06/13 0240  AST 23 17 84*  ALT 53* 35 48*  ALKPHOS 81 91 78  BILITOT 0.7 0.8 1.0  PROT 6.1 6.0 5.4*  ALBUMIN 1.9* 1.9* 1.8*   No results found for this basename: LIPASE, AMYLASE,  in the last 168 hours No results found for this basename: AMMONIA,  in the last 168 hours CBC:  Recent Labs Lab 08/06/13 0240  08/06/13 1703 08/07/13 0008 08/07/13 0729 08/08/13 0251 08/09/13 0500  WBC 25.5*  < > 25.0* 24.3* 25.6* 23.0* 16.6*  NEUTROABS 21.3*  --   --   --   --   --   --   HGB 8.0*  < > 8.4* 8.3* 8.0* 8.4* 8.2*  HCT 23.8*  < > 25.1* 24.6* 23.9* 25.5* 24.9*  MCV 78.5  < > 78.9 78.6 78.9 80.4 80.6  PLT 176  < > 184 178 201 240 251  < > = values in this interval not displayed. Cardiac Enzymes:  Recent Labs Lab 08/07/13 0410 08/07/13 1010 08/07/13 1500  TROPONINI <0.30 <0.30 <0.30   BNP (last 3 results)  Recent Labs  07/31/13 1100  PROBNP 3000.0*   CBG:  Recent Labs Lab 08/05/13 1708  GLUCAP 114*    Recent Results (from the past 240 hour(s))  CULTURE, BLOOD (ROUTINE X 2)     Status: None   Collection Time    07/31/13 11:20 AM      Result Value Ref Range Status   Specimen Description BLOOD RIGHT ANTECUBITAL   Final   Special Requests BOTTLES DRAWN AEROBIC AND ANAEROBIC 5CC   Final    Culture  Setup Time     Final   Value: 07/31/2013 16:19     Performed at [facility]   Culture     Final   Value: STAPHYLOCOCCUS SPECIES (COAGULASE NEGATIVE)     Note: THE SIGNIFICANCE OF ISOLATING THIS ORGANISM FROM A SINGLE SET OF BLOOD CULTURES WHEN MULTIPLE SETS ARE DRAWN IS UNCERTAIN. PLEASE NOTIFY THE MICROBIOLOGY DEPARTMENT WITHIN ONE WEEK IF SPECIATION AND SENSITIVITIES ARE REQUIRED.     MICROCOCCUS SPECIES     Note: Standardized susceptibility testing for this organism is not available.     Note: Gram Stain Report Called to,Read Back By and Verified With: RACHEL [id number]  ON [id number] BY [facility]   Report Status 08/05/2013 FINAL   Final  CULTURE, BLOOD (ROUTINE X 2)     Status: None  Collection Time    07/31/13 11:25 AM      Result Value Ref Range Status   Specimen Description BLOOD RIGHT ARM UPPER   Final   Special Requests BOTTLES DRAWN AEROBIC AND ANAEROBIC 5CC   Final   Culture  Setup Time     Final   Value: 07/31/2013 14:25     Performed at [facility]   Culture     Final   Value: DIPHTHEROIDS(CORYNEBACTERIUM SPECIES)     Note: Standardized susceptibility testing for this organism is not available.     Note: Gram Stain Report Called to,Read Back By and Verified With: MONICA CROSS ON 08/01/2013 AT 9:35P BY [facility]     Performed at [facility]   Report Status 08/03/2013 FINAL   Final  URINE CULTURE     Status: None   Collection Time    07/31/13 12:19 PM      Result Value Ref Range Status   Specimen Description URINE, CLEAN CATCH   Final   Special Requests NONE   Final   Culture  Setup Time     Final   Value: 07/31/2013 12:54     Performed at [place]     Final   Value: NO GROWTH     Performed at [facility]   Culture     Final   Value: NO GROWTH     Performed at [facility]   Report Status 08/01/2013 FINAL   Final  MRSA PCR SCREENING     Status: None   Collection Time    07/31/13  9:02 PM       Result Value Ref Range Status   MRSA by PCR NEGATIVE  NEGATIVE Final   Comment:            The GeneXpert MRSA Assay (FDA     approved for NASAL specimens     only), is one component of a     comprehensive MRSA colonization     surveillance program. It is not     intended to diagnose MRSA     infection nor to guide or     monitor treatment for     MRSA infections.  URINE CULTURE     Status: None   Collection Time    08/05/13  9:05 AM      Result Value Ref Range Status   Specimen Description URINE, CATHETERIZED   Final   Special Requests Normal   Final   Culture  Setup Time     Final   Value: 08/05/2013 10:47     Performed at [place]     Final   Value: NO GROWTH     Performed at [facility]   Culture     Final   Value: NO GROWTH     Performed at [facility]   Report Status 08/06/2013 FINAL   Final  CULTURE, ROUTINE-ABSCESS     Status: None   Collection Time    08/05/13 12:32 PM      Result Value Ref Range Status   Specimen Description ABSCESS   Final   Special Requests ASPIRATION OF SUBCUTANEOUS FLUID COLLECTION   Final   Gram Stain     Final   Value: ABUNDANT WBC PRESENT,BOTH PMN AND MONONUCLEAR     NO SQUAMOUS EPITHELIAL CELLS SEEN     FEW GRAM POSITIVE COCCI     IN  PAIRS     Performed at [facility]     Final   Value: MULTIPLE ORGANISMS PRESENT, NONE PREDOMINANT     Note: NO STAPHYLOCOCCUS AUREUS ISOLATED NO GROUP A STREP (S.PYOGENES) ISOLATED     Performed at [facility]   Report Status 08/08/2013 FINAL   Final  CULTURE, ROUTINE-ABSCESS     Status: None   Collection Time    08/05/13 12:32 PM      Result Value Ref Range Status   Specimen Description ABSCESS   Final   Special Requests ASPIRATE OF PERIHEPATIC COLLECTION   Final   Gram Stain     Final   Value: ABUNDANT WBC PRESENT,BOTH PMN AND MONONUCLEAR     NO SQUAMOUS EPITHELIAL CELLS SEEN     RARE GRAM POSITIVE COCCI     IN PAIRS      Performed at [facility]   Culture     Final   Value: FEW MICROAEROPHILIC STREPTOCOCCI     Note: Standardized susceptibility testing for this organism is not available.     Performed at [facility]   Report Status 08/08/2013 FINAL   Final  ANAEROBIC CULTURE     Status: None   Collection Time    08/05/13 12:32 PM      Result Value Ref Range Status   Specimen Description ABSCESS   Final   Special Requests ASPIRATE OF PERIHEPATIC COLLECTION   Final   Gram Stain     Final   Value: ABUNDANT WBC PRESENT,BOTH PMN AND MONONUCLEAR     NO SQUAMOUS EPITHELIAL CELLS SEEN     RARE GRAM POSITIVE COCCI     IN PAIRS     Performed at [facility]   Culture     Final   Value: NO ANAEROBES ISOLATED; CULTURE IN PROGRESS FOR 5 DAYS     Performed at [facility]   Report Status PENDING   Incomplete  ANAEROBIC CULTURE     Status: None   Collection Time    08/05/13 12:32 PM      Result Value Ref Range Status   Specimen Description ABSCESS   Final   Special Requests ASPIRATE OF SUBUTANEOUS FLUID COLLECTION   Final   Gram Stain     Final   Value: ABUNDANT WBC PRESENT,BOTH PMN AND MONONUCLEAR     NO SQUAMOUS EPITHELIAL CELLS SEEN     FEW GRAM POSITIVE COCCI     IN PAIRS     Performed at [facility]   Culture     Final   Value: NO ANAEROBES ISOLATED; CULTURE IN PROGRESS FOR 5 DAYS     Performed at [facility]   Report Status PENDING   Incomplete  MRSA PCR SCREENING     Status: None   Collection Time    08/05/13  5:54 PM      Result Value Ref Range Status   MRSA by PCR NEGATIVE  NEGATIVE Final   Comment:            The GeneXpert MRSA Assay (FDA     approved for NASAL specimens     only), is one component of a     comprehensive MRSA colonization     surveillance program. It is not     intended to diagnose MRSA     infection nor to guide or     monitor treatment for     MRSA infections.  MRSA  PCR SCREENING     Status: None   Collection Time     08/08/13  4:58 AM      Result Value Ref Range Status   MRSA by PCR NEGATIVE  NEGATIVE Final   Comment:            The GeneXpert MRSA Assay (FDA     approved for NASAL specimens     only), is one component of a     comprehensive MRSA colonization     surveillance program. It is not     intended to diagnose MRSA     infection nor to guide or     monitor treatment for     MRSA infections.     Studies: Ir Ivc Filter Plmt / S&i /img Guid/mod Sed  08/08/2013   CLINICAL DATA:  Bilateral DVT, patient cannot be anticoagulated because of diffuse hemoperitoneum within the abdomen and pelvis following left hepatic mass biopsy  EXAM: ULTRASOUND GUIDANCE FOR VASCULAR ACCESS  IVC CATHETERIZATION AND VENOGRAM  IVC FILTER INSERTION  Date:  4/9/20154/12/2013 12:57 PM  Radiologist:  Jerilynn Mages. Daryll Brod, MD  Guidance:  Ultrasound fluoroscopic  FLUOROSCOPY TIME:  1 min 36 seconds  MEDICATIONS AND MEDICAL HISTORY: 3 mg Versed, 100 mcg fentanyl  ANESTHESIA/SEDATION: 15 min  CONTRAST:  39mL OMNIPAQUE IOHEXOL 300 MG/ML  SOLN  COMPLICATIONS: No immediate  PROCEDURE: Informed consent was obtained from the patient following explanation of the procedure, risks, benefits and alternatives. The patient understands, agrees and consents for the procedure. All questions were addressed. A time out was performed.  Maximal barrier sterile technique utilized including caps, mask, sterile gowns, sterile gloves, large sterile drape, hand hygiene, and betadine prep.  Under sterile condition and local anesthesia, right internal jugular venous access was performed with ultrasound. Over a guide wire, the IVC filter delivery sheath and inner dilator were advanced into the IVC just above the IVC bifurcation. Contrast injection was performed for an IVC venogram.  IVC VENOGRAM: The IVC is patent. No evidence of thrombus, stenosis, or occlusion. No variant venous anatomy. The renal veins are identified at L1.  IVC FILTER INSERTION: Through the  delivery sheath, the Bard Denali IVC filter was deployed in the infrarenal IVC at the L2-3 level just below the renal veins and above the IVC bifurcation. Contrast injection confirmed position. There is good apposition of the filter against the IVC.  The delivery sheath was removed and hemostasis was obtained with compression for 5 minutes. The patient tolerated the procedure well. No immediate complications.  IMPRESSION: Ultrasound and fluoroscopically guided infrarenal IVC filter insertion.   Electronically Signed   By: Daryll Brod M.D.   On: 08/08/2013 14:01    Scheduled Meds: . ertapenem  1 g Intravenous Q24H  . lidocaine  15 mL Mouth/Throat Once  . metoprolol tartrate  25 mg Oral BID   Continuous Infusions: . sodium chloride 40 mL/hr at 08/07/13 2216    Principal Problem:   Severe sepsis with septic shock Active Problems:   HYPERTENSION, BENIGN ESSENTIAL   Diet-controlled type 2 diabetes mellitus   Intraabdominal fluid collection   Abscess of abdominal cavity   Acute renal failure   Diverticulitis   Liver abscess   Sepsis(995.91)   SBO (small bowel obstruction)    Time spent: 35 minutes    [facility] Pager [phone]. If 7PM-7AM, please contact night-coverage at [email or web address], password [facility] 08/09/2013, 5:33 PM  LOS: 9 days

## 2013-08-10 ENCOUNTER — Other Ambulatory Visit: Payer: Self-pay | Admitting: Radiology

## 2013-08-10 DIAGNOSIS — K5730 Diverticulosis of large intestine without perforation or abscess without bleeding: Secondary | ICD-10-CM

## 2013-08-10 DIAGNOSIS — K650 Generalized (acute) peritonitis: Secondary | ICD-10-CM

## 2013-08-10 LAB — CBC
HCT: 25.6 % — ABNORMAL LOW (ref 36.0–46.0)
Hemoglobin: 8.1 g/dL — ABNORMAL LOW (ref 12.0–15.0)
MCH: 25.8 pg — ABNORMAL LOW (ref 26.0–34.0)
MCHC: 31.6 g/dL (ref 30.0–36.0)
MCV: 81.5 fL (ref 78.0–100.0)
PLATELETS: 270 10*3/uL (ref 150–400)
RBC: 3.14 MIL/uL — AB (ref 3.87–5.11)
RDW: 16.3 % — AB (ref 11.5–15.5)
WBC: 16.3 10*3/uL — AB (ref 4.0–10.5)

## 2013-08-10 LAB — ANAEROBIC CULTURE

## 2013-08-10 MED ORDER — ERTAPENEM SODIUM 1 G IJ SOLR: 1.0000 g | INTRAMUSCULAR | Status: AC

## 2013-08-10 MED ORDER — ACETAMINOPHEN 650 MG RE SUPP: 650.0000 mg | Freq: Four times a day (QID) | RECTAL | Status: DC | PRN

## 2013-08-10 MED ORDER — HEPARIN SOD (PORK) LOCK FLUSH 100 UNIT/ML IV SOLN
250.0000 [IU] | INTRAVENOUS | Status: AC | PRN
  Administered 2013-08-10: 250 [IU]

## 2013-08-10 MED ORDER — METOPROLOL TARTRATE 25 MG PO TABS: 25.0000 mg | ORAL_TABLET | Freq: Two times a day (BID) | ORAL | Status: DC

## 2013-08-10 MED ORDER — OXYCODONE-ACETAMINOPHEN 5-325 MG PO TABS: 1.0000 | ORAL_TABLET | Freq: Four times a day (QID) | ORAL | Status: DC | PRN

## 2013-08-10 NOTE — Progress Notes (Signed)
Subjective: Patient s/p perihepatic abscess drain placed 08/05/13, she denies any pain, fever or chills. She is hopeful to be discharged soon.   Objective: Physical Exam: BP 159/102  Pulse 100  Temp(Src) 98.9 F (37.2 C) (Oral)  Resp 20  Ht 5\' 1"  (1.549 m)  Wt 309 lb 15.5 oz (140.6 kg)  BMI 58.60 kg/m2  SpO2 99%  General: A&Ox3, NAD, up walking around in room Abd: Soft, NT, ND, (+) BS, RUQ drain intact dressing C/D, no signs of bleeding or leakage, 24 hr output 15cc (40cc) serosang, Cx + microaerophilic strept  Labs: CBC  Recent Labs  08/09/13 0500 08/10/13 0535  WBC 16.6* 16.3*  HGB 8.2* 8.1*  HCT 24.9* 25.6*  PLT 251 270   BMET  Recent Labs  08/08/13 0855 08/09/13 0500  NA 141 140  K 3.7 3.8  CL 104 102  CO2 24 24  GLUCOSE 99 91  BUN 14 11  CREATININE 1.18* 1.11*  CALCIUM 8.0* 8.0*   LFT No results found for this basename: PROT, ALBUMIN, AST, ALT, ALKPHOS, BILITOT, BILIDIR, IBILI, LIPASE,  in the last 72 hours PT/INR No results found for this basename: LABPROT, INR,  in the last 72 hours   Studies/Results: Ir Ivc Filter Plmt / S&i /img Guid/mod Sed  08/08/2013   CLINICAL DATA:  Bilateral DVT, patient cannot be anticoagulated because of diffuse hemoperitoneum within the abdomen and pelvis following left hepatic mass biopsy  EXAM: ULTRASOUND GUIDANCE FOR VASCULAR ACCESS  IVC CATHETERIZATION AND VENOGRAM  IVC FILTER INSERTION  Date:  4/9/20154/12/2013 12:57 PM  Radiologist:  Jerilynn Mages. Daryll Brod, MD  Guidance:  Ultrasound fluoroscopic  FLUOROSCOPY TIME:  1 min 36 seconds  MEDICATIONS AND MEDICAL HISTORY: 3 mg Versed, 100 mcg fentanyl  ANESTHESIA/SEDATION: 15 min  CONTRAST:  64mL OMNIPAQUE IOHEXOL 300 MG/ML  SOLN  COMPLICATIONS: No immediate  PROCEDURE: Informed consent was obtained from the patient following explanation of the procedure, risks, benefits and alternatives. The patient understands, agrees and consents for the procedure. All questions were addressed. A time out  was performed.  Maximal barrier sterile technique utilized including caps, mask, sterile gowns, sterile gloves, large sterile drape, hand hygiene, and betadine prep.  Under sterile condition and local anesthesia, right internal jugular venous access was performed with ultrasound. Over a guide wire, the IVC filter delivery sheath and inner dilator were advanced into the IVC just above the IVC bifurcation. Contrast injection was performed for an IVC venogram.  IVC VENOGRAM: The IVC is patent. No evidence of thrombus, stenosis, or occlusion. No variant venous anatomy. The renal veins are identified at L1.  IVC FILTER INSERTION: Through the delivery sheath, the Bard Denali IVC filter was deployed in the infrarenal IVC at the L2-3 level just below the renal veins and above the IVC bifurcation. Contrast injection confirmed position. There is good apposition of the filter against the IVC.  The delivery sheath was removed and hemostasis was obtained with compression for 5 minutes. The patient tolerated the procedure well. No immediate complications.  IMPRESSION: Ultrasound and fluoroscopically guided infrarenal IVC filter insertion.   Electronically Signed   By: Daryll Brod M.D.   On: 08/08/2013 14:01    Assessment/Plan: Perihepatic abscess s/p drain 08/05/13 Output decreasing, wbc trending down 16.3 (16.6), afebrile today, Cx (+) microaerophilic strept and mixed flora, ID following and recommend continuing Invanz x 1 month with repeat imaging prior to stopping IV therapy.  When output less than 15 cc/day, will need repeat CT to eval abscess  prior to drain removal.    LOS: 10 days    Hedy Jacob PA-C 08/10/2013 10:46 AM

## 2013-08-10 NOTE — Progress Notes (Signed)
Pt given d/c instructions at this time; pt to receive IV ABX dose this evening prior to d/c home; will cont. To monitor.

## 2013-08-10 NOTE — Progress Notes (Signed)
Pt d/c home with daughter

## 2013-08-10 NOTE — Progress Notes (Signed)
Out-pt CT and IR eval/management orders placed for 1 week.

## 2013-08-10 NOTE — Progress Notes (Signed)
   CARE MANAGEMENT NOTE 08/10/2013  Patient:  Leah Peterson, Leah Peterson   Account Number:  [id number]  Date Initiated:  08/01/2013  Documentation initiated by:  [facility]  Subjective/Objective Assessment:   Admitted with abd pain, tachycardia, fever.  Sepsis - diverticulosis - ?? perf, ?? mass, lesions.  partial ileus.     Action/Plan:   Anticipated DC Date:  08/08/2013   Anticipated DC Plan:  [place]  CM consult      Choice offered to / List presented to:          [facility] arranged  HH-1 RN  IV Antibiotics      [place].   Status of service:  Completed, signed off Medicare Important Message given?   (If response is "NO", the following Medicare IM given date fields will be blank) Date Medicare IM given:   Date Additional Medicare IM given:    Discharge Disposition:  [place]  Per UR Regulation:  Reviewed for med. necessity/level of care/duration of stay  If discussed at [place] of Stay Meetings, dates discussed:    Comments:  08/10/13 13:45 CM spoke to pt concerning her HHRN to run IV ABX Colbert Ewing) and JP drain management.  CM texted [facility] rep [place] for referral for [facility] and IV ABX set up.  Pt to receive run at 18:00 prior to discharge.   Address change of [place] as city and phone contact verified with pt.  No other CM needs were communicated.  Mariane Masters, BSN, CM [phone].   Contact:  [place] Daughter [phone]

## 2013-08-10 NOTE — Progress Notes (Signed)
PIV d/c at this time as well as tele monitor; pt awaiting CM to set up [facility] for IV antibiotics; pt's daughter to transport pt home; will cont. To monitor.

## 2013-08-10 NOTE — Progress Notes (Signed)
IR will schedule patient for a CT abdomen/pelvis with oral and IV contrast for evaluation of pelvic abscess and peri-hepatic abscess with possible peri-hepatic drain removal on 08/15/13 or within 1 week per Dr. Pascal Lux. Orders placed.   Tsosie Billing PA-C Interventional Radiology  08/10/13  12:12 PM

## 2013-08-10 NOTE — Discharge Summary (Signed)
Physician Discharge Summary  Leah Peterson [id number] DOB: 1956/04/15 DOA: 07/31/2013  PCP: Ria Bush, MD  Admit date: 07/31/2013 Discharge date: 08/10/2013  Time spent: 35 minutes  Recommendations for Outpatient Follow-up:  1. Patient to be discharged on IV antibiotics with Invanz 1 gram IV q daily for 1 month. Stop date: Sep 07, 2013 2. She will need a repeat CT of Abd/Pelvis when output from drain is less than 15 cc/day prior to drain removal per Interventional Radiology 3. Home Health Services will assist with home infusion therapy    Discharge Diagnoses:  Principal Problem:   Severe sepsis with septic shock Active Problems:   HYPERTENSION, BENIGN ESSENTIAL   Diet-controlled type 2 diabetes mellitus   Intraabdominal fluid collection   Abscess of abdominal cavity   Acute renal failure   Diverticulitis   Liver abscess   Sepsis(995.91)   SBO (small bowel obstruction)   Discharge Condition: Stable  Diet recommendation: Heart Healthy  Filed Weights   07/31/13 1113 08/05/13 1800  Weight: 127.007 kg (280 lb) 140.6 kg (309 lb 15.5 oz)    History of present illness:  Leah Peterson is a 58 year old woman with a PMH of HTN and diverticulitis who presents with abdominal pain, weakness, chills. She began to feel unwell four days ago with 3 episodes of non-bloody diarrhea. The following day she began to experience chills and sweats which caused her to stay home from work. She then developed lower abdominal pain, lightheadedness, weakness and dyspnea with exertion. She also noticed a lump in her epigastric area that was painful to palpation. She reports decreased po intake but denies nausea, vomiting, headache or chest pain. Her urine has been dark for the past day but she denies dysuria or increased frequency. She notes decreased urinary production for the past few days.    Hospital Course:  Patient is a pleasant 58 year old female with a past medical history of  diverticulitis who was admitted to the pulmonary critical care service on 07/31/2013 presenting with complaints of abdominal pain and diarrhea. She had a CT scan on admission that showed a 5.5 cm collection in the left lower pelvis contiguous with the uterus. There was concerns that this could reflect. Diverticular abscess. Also noted was a hypoattenuating mass like lesion in the lateral segment of the left lobe of the liver measuring 9.3 cm in greatest dimension. Patient's initially was septic and thus admitted to the intensive care unit. General surgery was consulted. Interventional radiology was also consulted as she underwent core biopsy of left lower lobe mass, aspiration of subcutaneous fluid collection and percutaneous drainage of perihepatic fluid. A 12 French drain to suction bulb left in place. Liver biopsy was negative for malignancy. Patient's hospitalization complicated by the development of deep venous thrombosis as venous duplex performed on 08/07/2013 showed deep venous thrombosis involving right posterior tibial vein and left posterior to tibial veins. She was not started on anticoagulation due to hepatic sub-capsular hematoma. Patient undergoing IVC filter placement on 08/09/2013. Cultures from perihepatic aspirate drawn 08/05/2013 growing Microaerophilic streptococci. Culture from subcutaneous aspiration grew multiple organisms, no staph aureus noted. Infectious disease consulted. ID recommended at least 1 month treatment with vancomycin and imipenem.   SIGNIFICANT EVENTS / STUDIES:  4/2 [place] Abd: Solid mass in hepatic segment 2 of 10.3cm demonstrating internal vascularity. DDx includes hepatocellular carcinoma, metastatic disease, or benign neoplasms including focal nodular hyperplasia and adenoma. Adjacent subcapsular complex cystic structure with fluid levels favoring subcapsular hematoma-almost certainly related to the adjacent  mass lesion. When pt clinically able, evaluation with MRI Abd  w/wo contrast is recommended to further assess solid mass.  4/2 CT Abd/Pelvis: Partial SBO, transition point not definitive but appears in left anterior pelvis where there are inflammatory changes. Also, 5.5cm collection in left lower pelvis contiguous with uterus. Pt had diverticulosis in this location previously and may reflect peridiverticular abscess. Extensive diverticulosis with an area of wall thickening of mid sigmoid colon which could support diverticulitis. Small amt of ascites. There is focal fluid collection between left lobe of liver and stomach. Also a small mass/fluid collection in subcutaneous fat of upper abdominal midline measuring 2.5 x 2.1cm. There is a hypo attenuating mass-like lesion in lateral left lobe of liver measuring 9.3cm.  4/2 Repeat CT Abd/Pelvis with rectal contrast: communication of the sigmoid colon lumen to a small and thick walled abscess with fistula demonstrated from the sigmoid colon lumen after administration of rectal contrast. The liquefied central portion of this abscess only measures 1.8 cm. Multitude of other areas of free fluid and loculated fluid are seen in the peritoneal cavity with the largest focal fluid in the gastrohepatic space under the left lobe of the liver. Persistent large mass in the left lobe of the liver with associated subcutaneous nodule which may represent metastatic disease in the midline anterior subcutaneous fat.  4/6 IR: Core bx of left lobe liver mass, aspiration of subq fluid collection, perc drainage of perihepatic fluid. Returned to SDU hypotensive, tachycardic, with fevers/chills. PCCM called.  4/7 CT Abd/Pelvis wo contrast:  Interval placement of pigtail drainage catheter in the subhepatic region inferior to the left hepatic lobe. There is a small amt of bleeding into the left hepatic lobe mass. New gas within the mass is compatible with Gel-Foam from procedure or earlier today. Small anterior left hepatic lobe subcapsular hematoma with  hematocrit level. Moderate hemoperitoneum is new. Probable abscess in the anterior anatomic pelvis superior to the dome of the urinary bladder.  4/6 FNA of hepatic/perihepatic fluid: Cytology reveals no malignant cells; acute inflammation.  4/8 LE Dopplers: >> DVT in bilateral PTV's. Echo: EF 50 - 60%, no RWMA.  4/9 IVC FIlter  CULTURES:  MRSA 4/6>> NEG  BC 4/1>>1/2 gpc in pairs, 2/2 gpr>>2/2 Cornyebacterium sp  UC 4/1>>NG FINAL  Abscess (perihepatic) 4/6 >>>  Abscess (SQ fluid) 4/6>>  Hepatic mass/perihepatic fluid cytology 4/6 >>>no malignant cells; acute inflammation  Consultations:  PCCM  Infectious Disease  Interventional Radiology  General Surgery  Discharge Exam: Filed Vitals:   08/10/13 0945  BP: 159/102  Pulse: 100  Temp:   Resp:    General: No acute distress, patient sitting at bedside chair, appears comfortable  Cardiovascular: Regular rate rhythm normal S1-S2  Respiratory: Clear to auscultation bilaterally no wheezing rhonchi overall  Abdomen: Obese soft nontender nondistended, JP drain in place  Musculoskeletal: No edema   Discharge Instructions You were cared for by a hospitalist during your hospital stay. If you have any questions about your discharge medications or the care you received while you were in the hospital after you are discharged, you can call the unit and asked to speak with the hospitalist on call if the hospitalist that took care of you is not available. Once you are discharged, your primary care physician will handle any further medical issues. Please note that NO REFILLS for any discharge medications will be authorized once you are discharged, as it is imperative that you return to your primary care physician (or establish a relationship with  a primary care physician if you do not have one) for your aftercare needs so that they can reassess your need for medications and monitor your lab values.  Discharge Orders   Future Orders Complete By  Expires   Call MD for:  difficulty breathing, headache or visual disturbances  As directed    Call MD for:  extreme fatigue  As directed    Call MD for:  persistant dizziness or light-headedness  As directed    Call MD for:  persistant nausea and vomiting  As directed    Call MD for:  redness, tenderness, or signs of infection (pain, swelling, redness, odor or green/yellow discharge around incision site)  As directed    Call MD for:  severe uncontrolled pain  As directed    Call MD for:  temperature >100.4  As directed    Diet - low sodium heart healthy  As directed    Increase activity slowly  As directed        Medication List    STOP taking these medications       triamterene-hydrochlorothiazide 37.5-25 MG per tablet  Commonly known as:  MAXZIDE-25      TAKE these medications       acetaminophen 650 MG suppository  Commonly known as:  TYLENOL  Place 1 suppository (650 mg total) rectally every 6 (six) hours as needed for fever or mild pain.     amLODipine 5 MG tablet  Commonly known as:  NORVASC  Take 5 mg by mouth daily.     cetirizine 10 MG tablet  Commonly known as:  ZYRTEC  Take 10 mg by mouth daily as needed.     cholecalciferol 1000 UNITS tablet  Commonly known as:  VITAMIN D  Take 1,000 Units by mouth daily.     dicyclomine 10 MG capsule  Commonly known as:  BENTYL  Take 1 capsule (10 mg total) by mouth 4 (four) times daily as needed.     ertapenem 1 g in sodium chloride 0.9 % 50 mL  Inject 1 g into the vein daily.     metoprolol tartrate 25 MG tablet  Commonly known as:  LOPRESSOR  Take 1 tablet (25 mg total) by mouth 2 (two) times daily.     multivitamin with minerals Tabs tablet  Take 1 tablet by mouth daily.     oxyCODONE-acetaminophen 5-325 MG per tablet  Commonly known as:  ROXICET  Take 1 tablet by mouth every 6 (six) hours as needed.       Allergies  Allergen Reactions  . Lisinopril     REACTION: lethargy, cough.  . Penicillins      REACTION: LYMPH NODES SWELL       Follow-up Information   Follow up with Alcide Evener, MD In 2 weeks.   Specialty:  Infectious Diseases   Contact information:   [address] [place] [address] [phone]       Follow up with WYATT, [name], MD In 1 week.   Specialty:  General Surgery   Contact information:   [place], [place] [address] [phone]       Follow up with Ria Bush, MD.   Specialty:  [facility]   Contact information:   [place] [address] [phone]       Follow up with Ria Bush, MD In 1 week.   Specialty:  Family Medicine   Contact information:   [place] [address] [phone]        The results of significant diagnostics from this hospitalization (including imaging, microbiology, ancillary and laboratory) are listed below for reference.    Significant Diagnostic Studies: Ct Abdomen Pelvis Wo Contrast  08/05/2013   CLINICAL DATA:  Evaluate intra-abdominal hemorrhage. Angiogram this morning. Known liver mass.  EXAM: CT ABDOMEN AND PELVIS WITHOUT CONTRAST  TECHNIQUE: Multidetector CT imaging of the abdomen and pelvis was performed following the standard protocol without intravenous contrast.  COMPARISON:  US BIOPSY dated 08/05/2013; CT ABD/PELV WO CM dated 08/01/2013; CT ABD/PELV WO CM dated 07/31/2013  FINDINGS: Small bilateral pleural effusions with bilateral lower lobe atelectasis.  There is a pigtail drainage catheter in the midline of the upper abdomen, extending inferior to the right hepatic lobe. The previously seen sub hepatic fluid collection is being drained by the pigtail drainage catheter and collection is now decompressed. There is gas within the left hepatic lobe mass. Left hepatic lobe mass measures 10 cm AP x 13.8 cm transverse. This is larger than on the prior examination and there is some  increased attenuation within the mass, some of which represents Gel-Foam and some of which represents hemorrhage associated with instrumentation earlier today. There is an anterior left hepatic lobe subcapsular hematoma which is difficult to clearly define but measures 3.3 cm in thickness with layering hematocrit level.  In the anatomic pelvis, there is an air-fluid level which appears distinct from the collapsed urinary bladder which has a Foley catheter. This may be within the deep pelvic abscess anterior to the uterus however the pelvis is poorly visualized in the absence of IV contrast. Hemo peritoneum is present tracking along the left abdominal wall, outlining the pericolic gutter.  Vicarious excretion of contrast is present in the gallbladder. Kidneys and adrenals appear within normal limits. There is no bowel obstruction. Fluid collection in the anterior abdominal wall appears slightly smaller compatible with needle aspiration. Foley catheter present urinary bladder. 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  IMPRESSION: 1. Interval placement of pigtail drainage catheter in the subhepatic region inferior to the left hepatic lobe. 2. Small amount of bleeding into the left hepatic lobe mass. New gas within the mass is compatible with Gel-Foam from procedure or earlier today. 3. Small anterior left hepatic lobe subcapsular hematoma with hematocrit level. 4. Moderate hemoperitoneum is new. 5. Probable abscess in the anterior anatomic pelvis superior to the dome of the urinary bladder (image 76 series 2).   Electronically Signed   By: Dereck Ligas M.D.   On: 08/05/2013 17:16   Ct Abdomen Pelvis Wo Contrast  08/01/2013   CLINICAL DATA:  Sepsis, possible sigmoid diverticular abscess and hepatic mass. CT performed yesterday demonstrated probable abscess in the left pelvis. Additional CT is performed to try to delineate etiology.  EXAM: CT ABDOMEN AND PELVIS WITHOUT CONTRAST  TECHNIQUE:  Multidetector CT imaging of the abdomen and pelvis was performed following the standard protocol without IV contrast.  COMPARISON:  CT ABD/PELV WO CM dated 07/31/2013; US ABDOMEN LIMITED RUQ/ASCITES dated 07/31/2013  FINDINGS: CT was performed in a supine position of the abdomen pelvis initially without contrast material. Repeat imaging was then performed after administration of water soluble rectal contrast.  Initial imaging demonstrates transit of oral contrast from yesterday's CT into the colon with contrast reaching the splenic flexure. After administration of rectal contrast, there is evidence of a definite communication from the  sigmoid colon into a a thick walled abscess cavity of the left pelvis which abuts the left adnexal region. Oral contrast is seen in this cavity. The liquefied central fluid component is quite small, measuring 1.8 cm in diameter. Diverticulosis of the sigmoid and descending colon is present.  Subcutaneous mass of the anterior midline abdomen is again demonstrated measuring up to 2.9 cm in diameter and likely representing a metastatic nodule/lymph node. A large left lobe hepatic mass is again visualized measuring 11 cm in greatest diameter.  A multitude of loculated fluid collections are also again identified. Gastrohepatic fluid along the undersurface of the left lobe of the liver measures approximately 7 cm. Multiple loculations of fluid are present in the mesenteric. Fluid extends into the pelvis with the pelvic component more consistent with non loculated pelvic fluid.  IMPRESSION: 1. There is communication of the sigmoid colon lumen to a small and thick walled abscess with fistula demonstrated from the sigmoid colon lumen after administration of rectal contrast. The liquefied central portion of this abscess only measures 1.8 cm. 2. Multitude of other areas of free fluid and loculated fluid are seen in the peritoneal cavity with the largest focal fluid in the gastrohepatic space under  the left lobe of the liver. 3. Persistent large mass in the left lobe of the liver with associated subcutaneous nodule which may represent metastatic disease in the midline anterior subcutaneous fat.   Electronically Signed   By: Aletta Edouard M.D.   On: 08/01/2013 17:50   Ct Abdomen Pelvis Wo Contrast  07/31/2013   CLINICAL DATA:  Abdominal pain starting 2 nights ago. Pain located in the central upper abdomen and across the lower abdomen.  EXAM: CT ABDOMEN AND PELVIS WITHOUT CONTRAST  TECHNIQUE: Multidetector CT imaging of the abdomen and pelvis was performed following the standard protocol without intravenous contrast.  COMPARISON:  10/27/2007  FINDINGS: In the pelvis, where is a small collection that lies posterior and to the left of the sigmoid colon adjacent to the iliopsoas muscle. It measures approximately 5.5 cm in greatest transverse dimension. It is likely a peridiverticular abscess.  There is extensive diverticulosis of the colon. There is some thickening of the mid sigmoid colon near the presumed abscess. There are mild associated inflammatory changes. There is some inflammatory change extends from a loop of small bowel in the left anterior pelvis superiorly and posteriorly.  There is small bowel dilation of the proximal mid small bowel. The exact transition point is not definitive, but is suggested in the left anterior pelvis associated with the pelvic inflammation.  There is a small amount of ascites that primarily collects in the pelvis that is also that is seen adjacent to the liver and spleen.  There are multiple prominent, but not pathologically enlarged, mesenteric lymph nodes.  The heart is mildly enlarged. There is lung base atelectasis, greater on the left. A minimal left pleural effusion is present.  There is uptake, masslike area of relative hypoattenuation arising from the lateral segment of the left lobe of the liver. It measures 9.3 cm x 8.1 cm by 8.2 cm. There is some localized fluid  adjacent to the left lobe of the liver and the lesser curvature of the stomach. No other liver lesion. Spleen is unremarkable. No convincing gallbladder abnormality. No bile duct dilation. Normal pancreas. No adrenal masses. Kidneys in ureters are unremarkable. Bladder is decompressed by a Foley catheter. Questionable right uterine fibroid. Uterus otherwise unremarkable.  There are degenerative changes noted along the  spine, involving the SI joints and both hips. No osteoblastic or osteolytic lesions.  There are small mass or fluid collection measuring 2.5 cm in the anterior upper abdominal midline, within the deep subcutaneous fat. This is nonspecific. No evidence of an abdominal wall hernia.  IMPRESSION: 1. Partial small bowel obstruction. Transition point is not definitive, but appears to be in the left anterior pelvis where there are inflammatory changes. 2. There is a 5.5 cm collection in the left lower pelvis contiguous with the uterus. The patient has had diverticulitis in this location previously. This may reflect a peridiverticular abscess. 3. Extensive diverticulosis throughout the colon. There is an area of apparent wall thickening of the mid sigmoid colon which would also support diverticulitis. 4. Small amount of ascites. There is focal fluid that collects between the left lobe of the liver and the stomach. There is a small mass or fluid collection in the subcutaneous fat of the upper abdominal midline measuring 2.5 cm x 2.1 cm. 5. There is a hypo attenuating mass-like lesion in the lateral segment of the left lobe of the liver measuring 9.3 cm in greatest dimension. 6. Given this combination of findings. Patient would benefit from a contrast enhanced CT, when the patient can tolerate this procedure or if renal function allows.   Electronically Signed   By: Lajean Manes M.D.   On: 07/31/2013 16:49   [place] Abscess Drain  08/05/2013   CLINICAL DATA:  11 cm left hepatic mass, subcutaneous soft tissue  nodule and perihepatic fluid collection by prior CT.  EXAM: 1. ULTRASOUND-GUIDED CORE BIOPSY OF THE LIVER 2. ULTRASOUND-GUIDED ASPIRATION OF SUBCUTANEOUS FLUID COLLECTION 3. ULTRASOUND-GUIDED PERCUTANEOUS CATHETER DRAINAGE OF PERIHEPATIC ABSCESS  MEDICATIONS: 3.0 mg IV Versed; 100 mcg IV Fentanyl  Total Moderate Sedation Time: 24 minutes.  PROCEDURE: The procedure, risks, benefits, and alternatives were explained to the patient. Questions regarding the procedure were encouraged and answered. The patient understands and consents to the procedure.  The abdominal wall was prepped with Betadine in a sterile fashion, and a sterile drape was applied covering the operative field. A sterile gown and sterile gloves were used for the procedure. Local anesthesia was provided with 1% Lidocaine.  The liver, subcutaneous tissues and perihepatic region were first examined by ultrasound. An 18 gauge needle was advanced into the midline subcutaneous abdominal wall fluid collection and aspiration performed. Aspirated fluid sample was sent for culture analysis.  Ultrasound-guided biopsy was performed of a hepatic mass in the left lobe of the liver. A 17 gauge needle was advanced into the lesion. A total of 2 separate 18 gauge core biopsy samples were obtained and submitted in formalin. A slurry of sterile saline and Gel-Foam pledgets was injected through the outer needle as it was retracted out of the liver. Ultrasound was performed immediately post biopsy.  An 18 gauge needle was advanced into a left perihepatic/ subhepatic fluid collection. Aspiration was performed. A fluid sample was sent for culture. A guidewire was advanced into the collection. The tract was dilated and a 12 [place] percutaneous drain advanced into the collection. The drain was connected to a suction bulb. It was secured at the skin with a Prolene retention suture and StatLock device.  COMPLICATIONS: None.  FINDINGS: The midline subcutaneous abnormality  superficial to the abdominal wall seen by CT corresponds to a complex fluid collection by ultrasound with some surrounding tissue thickening. Aspiration yielded grossly purulent fluid. The cavity was largely decompressed but needle aspiration and knee fluid sample sent for  culture analysis.  The nearly 11 cm heterogeneous mass in the lateral segment of the left lobe of the liver was sampled, revealing solid tissue. There was considerable bleeding from the trocar needle after core biopsy and Gel-Foam pledgets were instilled as the outer needle was removed. No active bleeding was visualized from the surface of the liver by ultrasound immediately after completion of the biopsy.  The complex fluid collection adjacent to the left lobe of the liver and extending into the peritoneal cavity shows visible enlargement by ultrasound compared to a prior ultrasound 5 days ago with maximal dimensions of approximately 10 cm. This also appears to communicate with some additional complex fluid in the midline abdomen. Aspiration yielded bloody and turbid fluid which appeared likely infected. For this reason, a percutaneous drain was placed. A 12 French drain was placed over a wire and attached to a suction bulb. Fluid samples from this collection were sent for culture analysis as well as cytology.  IMPRESSION: 1. Aspiration of midline subcutaneous fluid collection yielded purulent fluid. The small collection was nearly completely decompressed by needle aspiration and a sample of fluid sent for culture analysis. 2. Ultrasound-guided core biopsy was performed of the large left lobe hepatic mass. The percutaneous tract was embolized with Gel-Foam pledgets due to bleeding from the outer needle during the biopsy. There was no immediate evidence of active bleeding from the surface of the liver upon completion of the biopsy. Core samples were sent for histologic analysis. 3. Enlargement of complex fluid collection adjacent to the left lobe  of the liver and extending into the peritoneal cavity. Aspiration yielded bloody and turbid fluid which was sent for culture analysis and cytologic analysis. A 12 French percutaneous drain was placed in this collection and attached to a suction bulb. Output will be followed.   Electronically Signed   By: Aletta Edouard M.D.   On: 08/05/2013 13:45   Ir Ivc Filter Plmt / S&i /img Guid/mod Sed  08/08/2013   CLINICAL DATA:  Bilateral DVT, patient cannot be anticoagulated because of diffuse hemoperitoneum within the abdomen and pelvis following left hepatic mass biopsy  EXAM: ULTRASOUND GUIDANCE FOR VASCULAR ACCESS  IVC CATHETERIZATION AND VENOGRAM  IVC FILTER INSERTION  Date:  4/9/20154/12/2013 12:57 PM  Radiologist:  Jerilynn Mages. Daryll Brod, MD  Guidance:  Ultrasound fluoroscopic  FLUOROSCOPY TIME:  1 min 36 seconds  MEDICATIONS AND MEDICAL HISTORY: 3 mg Versed, 100 mcg fentanyl  ANESTHESIA/SEDATION: 15 min  CONTRAST:  31mL OMNIPAQUE IOHEXOL 300 MG/ML  SOLN  COMPLICATIONS: No immediate  PROCEDURE: Informed consent was obtained from the patient following explanation of the procedure, risks, benefits and alternatives. The patient understands, agrees and consents for the procedure. All questions were addressed. A time out was performed.  Maximal barrier sterile technique utilized including caps, mask, sterile gowns, sterile gloves, large sterile drape, hand hygiene, and betadine prep.  Under sterile condition and local anesthesia, right internal jugular venous access was performed with ultrasound. Over a guide wire, the IVC filter delivery sheath and inner dilator were advanced into the IVC just above the IVC bifurcation. Contrast injection was performed for an IVC venogram.  IVC VENOGRAM: The IVC is patent. No evidence of thrombus, stenosis, or occlusion. No variant venous anatomy. The renal veins are identified at L1.  IVC FILTER INSERTION: Through the delivery sheath, the Bard Denali IVC filter was deployed in the infrarenal  IVC at the L2-3 level just below the renal veins and above the IVC bifurcation. Contrast injection confirmed  position. There is good apposition of the filter against the IVC.  The delivery sheath was removed and hemostasis was obtained with compression for 5 minutes. The patient tolerated the procedure well. No immediate complications.  IMPRESSION: Ultrasound and fluoroscopically guided infrarenal IVC filter insertion.   Electronically Signed   By: Ruel Favors M.D.   On: 08/08/2013 14:01   US Biopsy  08/05/2013   CLINICAL DATA:  11 cm left hepatic mass, subcutaneous soft tissue nodule and perihepatic fluid collection by prior CT.  EXAM: 1. ULTRASOUND-GUIDED CORE BIOPSY OF THE LIVER 2. ULTRASOUND-GUIDED ASPIRATION OF SUBCUTANEOUS FLUID COLLECTION 3. ULTRASOUND-GUIDED PERCUTANEOUS CATHETER DRAINAGE OF PERIHEPATIC ABSCESS  MEDICATIONS: 3.0 mg IV Versed; 100 mcg IV Fentanyl  Total Moderate Sedation Time: 24 minutes.  PROCEDURE: The procedure, risks, benefits, and alternatives were explained to the patient. Questions regarding the procedure were encouraged and answered. The patient understands and consents to the procedure.  The abdominal wall was prepped with Betadine in a sterile fashion, and a sterile drape was applied covering the operative field. A sterile gown and sterile gloves were used for the procedure. Local anesthesia was provided with 1% Lidocaine.  The liver, subcutaneous tissues and perihepatic region were first examined by ultrasound. An 18 gauge needle was advanced into the midline subcutaneous abdominal wall fluid collection and aspiration performed. Aspirated fluid sample was sent for culture analysis.  Ultrasound-guided biopsy was performed of a hepatic mass in the left lobe of the liver. A 17 gauge needle was advanced into the lesion. A total of 2 separate 18 gauge core biopsy samples were obtained and submitted in formalin. A slurry of sterile saline and Gel-Foam pledgets was injected through  the outer needle as it was retracted out of the liver. Ultrasound was performed immediately post biopsy.  An 18 gauge needle was advanced into a left perihepatic/ subhepatic fluid collection. Aspiration was performed. A fluid sample was sent for culture. A guidewire was advanced into the collection. The tract was dilated and a 12 [place] percutaneous drain advanced into the collection. The drain was connected to a suction bulb. It was secured at the skin with a Prolene retention suture and StatLock device.  COMPLICATIONS: None.  FINDINGS: The midline subcutaneous abnormality superficial to the abdominal wall seen by CT corresponds to a complex fluid collection by ultrasound with some surrounding tissue thickening. Aspiration yielded grossly purulent fluid. The cavity was largely decompressed but needle aspiration and knee fluid sample sent for culture analysis.  The nearly 11 cm heterogeneous mass in the lateral segment of the left lobe of the liver was sampled, revealing solid tissue. There was considerable bleeding from the trocar needle after core biopsy and Gel-Foam pledgets were instilled as the outer needle was removed. No active bleeding was visualized from the surface of the liver by ultrasound immediately after completion of the biopsy.  The complex fluid collection adjacent to the left lobe of the liver and extending into the peritoneal cavity shows visible enlargement by ultrasound compared to a prior ultrasound 5 days ago with maximal dimensions of approximately 10 cm. This also appears to communicate with some additional complex fluid in the midline abdomen. Aspiration yielded bloody and turbid fluid which appeared likely infected. For this reason, a percutaneous drain was placed. A 12 French drain was placed over a wire and attached to a suction bulb. Fluid samples from this collection were sent for culture analysis as well as cytology.  IMPRESSION: 1. Aspiration of midline subcutaneous fluid collection  yielded purulent  fluid. The small collection was nearly completely decompressed by needle aspiration and a sample of fluid sent for culture analysis. 2. Ultrasound-guided core biopsy was performed of the large left lobe hepatic mass. The percutaneous tract was embolized with Gel-Foam pledgets due to bleeding from the outer needle during the biopsy. There was no immediate evidence of active bleeding from the surface of the liver upon completion of the biopsy. Core samples were sent for histologic analysis. 3. Enlargement of complex fluid collection adjacent to the left lobe of the liver and extending into the peritoneal cavity. Aspiration yielded bloody and turbid fluid which was sent for culture analysis and cytologic analysis. A 12 French percutaneous drain was placed in this collection and attached to a suction bulb. Output will be followed.   Electronically Signed   By: Aletta Edouard M.D.   On: 08/05/2013 13:45   US Aspiration  08/05/2013   CLINICAL DATA:  11 cm left hepatic mass, subcutaneous soft tissue nodule and perihepatic fluid collection by prior CT.  EXAM: 1. ULTRASOUND-GUIDED CORE BIOPSY OF THE LIVER 2. ULTRASOUND-GUIDED ASPIRATION OF SUBCUTANEOUS FLUID COLLECTION 3. ULTRASOUND-GUIDED PERCUTANEOUS CATHETER DRAINAGE OF PERIHEPATIC ABSCESS  MEDICATIONS: 3.0 mg IV Versed; 100 mcg IV Fentanyl  Total Moderate Sedation Time: 24 minutes.  PROCEDURE: The procedure, risks, benefits, and alternatives were explained to the patient. Questions regarding the procedure were encouraged and answered. The patient understands and consents to the procedure.  The abdominal wall was prepped with Betadine in a sterile fashion, and a sterile drape was applied covering the operative field. A sterile gown and sterile gloves were used for the procedure. Local anesthesia was provided with 1% Lidocaine.  The liver, subcutaneous tissues and perihepatic region were first examined by ultrasound. An 18 gauge needle was advanced into  the midline subcutaneous abdominal wall fluid collection and aspiration performed. Aspirated fluid sample was sent for culture analysis.  Ultrasound-guided biopsy was performed of a hepatic mass in the left lobe of the liver. A 17 gauge needle was advanced into the lesion. A total of 2 separate 18 gauge core biopsy samples were obtained and submitted in formalin. A slurry of sterile saline and Gel-Foam pledgets was injected through the outer needle as it was retracted out of the liver. Ultrasound was performed immediately post biopsy.  An 18 gauge needle was advanced into a left perihepatic/ subhepatic fluid collection. Aspiration was performed. A fluid sample was sent for culture. A guidewire was advanced into the collection. The tract was dilated and a 12 [place] percutaneous drain advanced into the collection. The drain was connected to a suction bulb. It was secured at the skin with a Prolene retention suture and StatLock device.  COMPLICATIONS: None.  FINDINGS: The midline subcutaneous abnormality superficial to the abdominal wall seen by CT corresponds to a complex fluid collection by ultrasound with some surrounding tissue thickening. Aspiration yielded grossly purulent fluid. The cavity was largely decompressed but needle aspiration and knee fluid sample sent for culture analysis.  The nearly 11 cm heterogeneous mass in the lateral segment of the left lobe of the liver was sampled, revealing solid tissue. There was considerable bleeding from the trocar needle after core biopsy and Gel-Foam pledgets were instilled as the outer needle was removed. No active bleeding was visualized from the surface of the liver by ultrasound immediately after completion of the biopsy.  The complex fluid collection adjacent to the left lobe of the liver and extending into the peritoneal cavity shows visible enlargement by ultrasound compared  to a prior ultrasound 5 days ago with maximal dimensions of approximately 10 cm. This  also appears to communicate with some additional complex fluid in the midline abdomen. Aspiration yielded bloody and turbid fluid which appeared likely infected. For this reason, a percutaneous drain was placed. A 12 French drain was placed over a wire and attached to a suction bulb. Fluid samples from this collection were sent for culture analysis as well as cytology.  IMPRESSION: 1. Aspiration of midline subcutaneous fluid collection yielded purulent fluid. The small collection was nearly completely decompressed by needle aspiration and a sample of fluid sent for culture analysis. 2. Ultrasound-guided core biopsy was performed of the large left lobe hepatic mass. The percutaneous tract was embolized with Gel-Foam pledgets due to bleeding from the outer needle during the biopsy. There was no immediate evidence of active bleeding from the surface of the liver upon completion of the biopsy. Core samples were sent for histologic analysis. 3. Enlargement of complex fluid collection adjacent to the left lobe of the liver and extending into the peritoneal cavity. Aspiration yielded bloody and turbid fluid which was sent for culture analysis and cytologic analysis. A 12 French percutaneous drain was placed in this collection and attached to a suction bulb. Output will be followed.   Electronically Signed   By: Aletta Edouard M.D.   On: 08/05/2013 13:45   Dg Chest Port 1 View  08/02/2013   CLINICAL DATA:  Severe sepsis with septic shock. Acute renal failure. Acute respiratory failure.  EXAM: PORTABLE CHEST - 1 VIEW  COMPARISON:  08/01/2013  FINDINGS: Persistent low lung volumes seen. Mild decrease in bibasilar atelectasis. Marland Kitchen Heart size is stable. No evidence of pulmonary consolidation or pleural effusion. Right jugular central venous catheter and nasogastric tube remain in appropriate position.  IMPRESSION: Mild decrease in bibasilar atelectasis.   Electronically Signed   By: Earle Gell M.D.   On: 08/02/2013 08:06    Dg Chest Portable 1 View  08/01/2013   CLINICAL DATA:  Line placement.  EXAM: PORTABLE CHEST - 1 VIEW  COMPARISON:  Chest 07/31/2013.  FINDINGS: The patient has a new right IJ catheter with tip projecting over the lower superior vena cava. NG tube courses into the stomach and below the inferior margin of the film. Lung volumes are low but the lungs are clear. There is no pneumothorax pleural effusion. Cardiomegaly is noted.  IMPRESSION: Tip of right IJ catheter projects over the lower superior vena cava. No pneumothorax.  NG tube courses into the stomach and below the inferior margin of the film.  Cardiomegaly.  Lungs clear.   Electronically Signed   By: Inge Rise M.D.   On: 08/01/2013 13:20   Dg Chest Port 1 View  (if Code Sepsis Called)  07/31/2013   CLINICAL DATA:  Shortness of breath with chest pain.  EXAM: PORTABLE CHEST - 1 VIEW  COMPARISON:  Followup  FINDINGS: Low lung volumes. Enlarged cardiomediastinal silhouette, likely cardiomegaly. Mild vascular congestion. Small bilateral effusions are probable. No acute osseous findings.  IMPRESSION: Low lung volumes.  Cardiomegaly with early vascular congestion.   Electronically Signed   By: Rolla Flatten M.D.   On: 07/31/2013 12:03   US Abdomen Limited Ruq  07/31/2013   CLINICAL DATA:  Septic, liver mass versus abscess  EXAM: US ABDOMEN LIMITED - RIGHT UPPER QUADRANT  COMPARISON:  CT abdomen/ pelvis 07/31/2013  FINDINGS: Gallbladder:  No gallstones or wall thickening visualized. No sonographic Murphy sign noted.  Common bile  duct:  Diameter: Within normal limits at 1.8 mm  Liver:  Solid isoechoic mass in the posterior aspect of hepatic segment 2 measures 10.3 x 7.3 x 9.5 cm. There is evidence of internal vascularity on color Doppler imaging. Inferior to the dominant mass, there is a complex cystic structure which is more superficial and likely subcapsular in location. This complex cystic structure measures 5.1 x 3.8 x 5.8 cm and demonstrates a layering  fluid fluid level suspicious for hematocrit effect and the presence of blood products. No vascularity within the cystic abnormality. The remainder the liver is unremarkable.  IMPRESSION: 1. Solid mass lesion in hepatic segment 2 measures up to 10.3 cm in greatest dimension and demonstrates internal vascularity. Differential considerations include hepatocellular carcinoma, metastatic disease, and other benign neoplasms including focal nodular hyperplasia and adenoma. 2. Adjacent subcapsular complex cystic structure with fluid fluid levels is favored to reflect a subcapsular hematoma. This is almost certainly related to the adjacent mass lesion. When the patient is clinically able (able to hold respirations and maintain immobility) evaluation with MRI of the abdomen with and without contrast is recommended to further assess the solid mass.   Electronically Signed   By: Malachy Moan M.D.   On: 07/31/2013 22:12    Microbiology: Recent Results (from the past 240 hour(s))  URINE CULTURE     Status: None   Collection Time    07/31/13 12:19 PM      Result Value Ref Range Status   Specimen Description URINE, CLEAN CATCH   Final   Special Requests NONE   Final   Culture  Setup Time     Final   Value: 07/31/2013 12:54     Performed at [facility] Count     Final   Value: NO GROWTH     Performed at [facility]   Culture     Final   Value: NO GROWTH     Performed at [facility]   Report Status 08/01/2013 FINAL   Final  MRSA PCR SCREENING     Status: None   Collection Time    07/31/13  9:02 PM      Result Value Ref Range Status   MRSA by PCR NEGATIVE  NEGATIVE Final   Comment:            The GeneXpert MRSA Assay (FDA     approved for NASAL specimens     only), is one component of a     comprehensive MRSA colonization     surveillance program. It is not     intended to diagnose MRSA     infection nor to guide or     monitor treatment for     MRSA  infections.  URINE CULTURE     Status: None   Collection Time    08/05/13  9:05 AM      Result Value Ref Range Status   Specimen Description URINE, CATHETERIZED   Final   Special Requests Normal   Final   Culture  Setup Time     Final   Value: 08/05/2013 10:47     Performed at [facility]   Colony Count     Final   Value: NO GROWTH     Performed at [facility]   Culture     Final   Value: NO GROWTH     Performed at [facility]   Report Status 08/06/2013 FINAL  Final  CULTURE, ROUTINE-ABSCESS     Status: None   Collection Time    08/05/13 12:32 PM      Result Value Ref Range Status   Specimen Description ABSCESS   Final   Special Requests ASPIRATION OF SUBCUTANEOUS FLUID COLLECTION   Final   Gram Stain     Final   Value: ABUNDANT WBC PRESENT,BOTH PMN AND MONONUCLEAR     NO SQUAMOUS EPITHELIAL CELLS SEEN     FEW GRAM POSITIVE COCCI     IN PAIRS     Performed at [facility]   Culture     Final   Value: MULTIPLE ORGANISMS PRESENT, NONE PREDOMINANT     Note: NO STAPHYLOCOCCUS AUREUS ISOLATED NO GROUP A STREP (S.PYOGENES) ISOLATED     Performed at [facility]   Report Status 08/08/2013 FINAL   Final  CULTURE, ROUTINE-ABSCESS     Status: None   Collection Time    08/05/13 12:32 PM      Result Value Ref Range Status   Specimen Description ABSCESS   Final   Special Requests ASPIRATE OF PERIHEPATIC COLLECTION   Final   Gram Stain     Final   Value: ABUNDANT WBC PRESENT,BOTH PMN AND MONONUCLEAR     NO SQUAMOUS EPITHELIAL CELLS SEEN     RARE GRAM POSITIVE COCCI     IN PAIRS     Performed at [facility]   Culture     Final   Value: FEW MICROAEROPHILIC STREPTOCOCCI     Note: Standardized susceptibility testing for this organism is not available.     Performed at [facility]   Report Status 08/08/2013 FINAL   Final  ANAEROBIC CULTURE     Status: None   Collection Time    08/05/13 12:32 PM      Result Value Ref  Range Status   Specimen Description ABSCESS   Final   Special Requests ASPIRATE OF PERIHEPATIC COLLECTION   Final   Gram Stain     Final   Value: ABUNDANT WBC PRESENT,BOTH PMN AND MONONUCLEAR     NO SQUAMOUS EPITHELIAL CELLS SEEN     RARE GRAM POSITIVE COCCI     IN PAIRS     Performed at [facility]   Culture     Final   Value: NO ANAEROBES ISOLATED     Performed at [facility]   Report Status 08/10/2013 FINAL   Final  ANAEROBIC CULTURE     Status: None   Collection Time    08/05/13 12:32 PM      Result Value Ref Range Status   Specimen Description ABSCESS   Final   Special Requests ASPIRATE OF SUBUTANEOUS FLUID COLLECTION   Final   Gram Stain     Final   Value: ABUNDANT WBC PRESENT,BOTH PMN AND MONONUCLEAR     NO SQUAMOUS EPITHELIAL CELLS SEEN     FEW GRAM POSITIVE COCCI     IN PAIRS     Performed at [facility]   Culture     Final   Value: NO ANAEROBES ISOLATED     Performed at [facility]   Report Status 08/10/2013 FINAL   Final  MRSA PCR SCREENING     Status: None   Collection Time    08/05/13  5:54 PM      Result Value Ref Range Status   MRSA by PCR NEGATIVE  NEGATIVE Final   Comment:  The GeneXpert MRSA Assay (FDA     approved for NASAL specimens     only), is one component of a     comprehensive MRSA colonization     surveillance program. It is not     intended to diagnose MRSA     infection nor to guide or     monitor treatment for     MRSA infections.  MRSA PCR SCREENING     Status: None   Collection Time    08/08/13  4:58 AM      Result Value Ref Range Status   MRSA by PCR NEGATIVE  NEGATIVE Final   Comment:            The GeneXpert MRSA Assay (FDA     approved for NASAL specimens     only), is one component of a     comprehensive MRSA colonization     surveillance program. It is not     intended to diagnose MRSA     infection nor to guide or     monitor treatment for     MRSA infections.      Labs: Basic Metabolic Panel:  Recent Labs Lab 08/04/13 0500  08/06/13 0240 08/06/13 1703 08/07/13 0410 08/08/13 0855 08/09/13 0500  NA 142  < > 142 143 142 141 140  K 3.5*  < > 3.7 3.6* 3.6* 3.7 3.8  CL 103  < > 105 107 107 104 102  CO2 25  < > 24 23 23 24 24   GLUCOSE 120*  < > 112* 98 118* 99 91  BUN 21  < > 24* 24* 21 14 11   CREATININE 0.99  < > 1.19* 1.17* 1.14* 1.18* 1.11*  CALCIUM 8.3*  < > 7.2* 7.8* 7.7* 8.0* 8.0*  MG 2.1  --  1.8 1.9 1.7  --   --   PHOS  --   --  4.0 2.9  --   --   --   < > = values in this interval not displayed. Liver Function Tests:  Recent Labs Lab 08/04/13 0500 08/05/13 0500 08/06/13 0240  AST 23 17 84*  ALT 53* 35 48*  ALKPHOS 81 91 78  BILITOT 0.7 0.8 1.0  PROT 6.1 6.0 5.4*  ALBUMIN 1.9* 1.9* 1.8*   No results found for this basename: LIPASE, AMYLASE,  in the last 168 hours No results found for this basename: AMMONIA,  in the last 168 hours CBC:  Recent Labs Lab 08/06/13 0240  08/07/13 0008 08/07/13 0729 08/08/13 0251 08/09/13 0500 08/10/13 0535  WBC 25.5*  < > 24.3* 25.6* 23.0* 16.6* 16.3*  NEUTROABS 21.3*  --   --   --   --   --   --   HGB 8.0*  < > 8.3* 8.0* 8.4* 8.2* 8.1*  HCT 23.8*  < > 24.6* 23.9* 25.5* 24.9* 25.6*  MCV 78.5  < > 78.6 78.9 80.4 80.6 81.5  PLT 176  < > 178 201 240 251 270  < > = values in this interval not displayed. Cardiac Enzymes:  Recent Labs Lab 08/07/13 0410 08/07/13 1010 08/07/13 1500  TROPONINI <0.30 <0.30 <0.30   BNP: BNP (last 3 results)  Recent Labs  07/31/13 1100  PROBNP 3000.0*   CBG:  Recent Labs Lab 08/05/13 1708  GLUCAP 114*       Signed:  Sonam Wandel  [facility] 08/10/2013, 11:38 AM

## 2013-08-13 ENCOUNTER — Other Ambulatory Visit (INDEPENDENT_AMBULATORY_CARE_PROVIDER_SITE_OTHER): Payer: Self-pay | Admitting: Surgery

## 2013-08-13 ENCOUNTER — Telehealth: Payer: Self-pay | Admitting: Family Medicine

## 2013-08-13 DIAGNOSIS — K650 Generalized (acute) peritonitis: Secondary | ICD-10-CM

## 2013-08-13 NOTE — Telephone Encounter (Signed)
Transitional Care Call.  Pt d/c'd from hospital on 08/10/13.  D/C dx: Severe sepsis with septic shock.  Spoke with pt.  She states that she is having little to no pain but c/o lack of energy.  She is able to ambulate and perform ADLs independently but tires easily.  Appetite is fair, pt states she "does not feel like eating".  She has only taken on dose of Roxicet since d/c.  JP drain remains intact.  Drsg changed by [facility] on Sunday and they removed 60ml of drainage from bulb per pt.  She states that there is little to no drainage in the JP drain.  It is scheduled to be removed on Thursday 4/16 when pt is also scheduled to have CT.  Pt's daughter has been coming by in the evening to assist with IV abx.  Pt states she is a member of the [employer] and has reached out to the town clerk who has provided her with names of a few retired nurses in her neighborhood to assist her with IV abx if her daughter is unable to help.  Denies questions r/t d/c instructions or medication list.  Denies questions for Dr.  Danise Mina.  Follow-up appt scheduled for 08/14/13.

## 2013-08-14 ENCOUNTER — Telehealth: Payer: Self-pay | Admitting: Radiology

## 2013-08-14 ENCOUNTER — Encounter: Payer: Self-pay | Admitting: Family Medicine

## 2013-08-14 ENCOUNTER — Ambulatory Visit (INDEPENDENT_AMBULATORY_CARE_PROVIDER_SITE_OTHER): Payer: BC Managed Care – PPO | Admitting: Family Medicine

## 2013-08-14 VITALS — BP 144/90 | HR 64 | Temp 98.4°F | Wt 305.2 lb

## 2013-08-14 DIAGNOSIS — R5381 Other malaise: Secondary | ICD-10-CM

## 2013-08-14 DIAGNOSIS — R531 Weakness: Secondary | ICD-10-CM | POA: Insufficient documentation

## 2013-08-14 DIAGNOSIS — E876 Hypokalemia: Secondary | ICD-10-CM

## 2013-08-14 DIAGNOSIS — E119 Type 2 diabetes mellitus without complications: Secondary | ICD-10-CM

## 2013-08-14 DIAGNOSIS — E43 Unspecified severe protein-calorie malnutrition: Secondary | ICD-10-CM | POA: Insufficient documentation

## 2013-08-14 DIAGNOSIS — A419 Sepsis, unspecified organism: Secondary | ICD-10-CM

## 2013-08-14 DIAGNOSIS — D62 Acute posthemorrhagic anemia: Secondary | ICD-10-CM

## 2013-08-14 DIAGNOSIS — K651 Peritoneal abscess: Secondary | ICD-10-CM

## 2013-08-14 DIAGNOSIS — Z8719 Personal history of other diseases of the digestive system: Secondary | ICD-10-CM

## 2013-08-14 DIAGNOSIS — D649 Anemia, unspecified: Secondary | ICD-10-CM

## 2013-08-14 DIAGNOSIS — R6521 Severe sepsis with septic shock: Secondary | ICD-10-CM

## 2013-08-14 DIAGNOSIS — R6520 Severe sepsis without septic shock: Secondary | ICD-10-CM

## 2013-08-14 DIAGNOSIS — N179 Acute kidney failure, unspecified: Secondary | ICD-10-CM

## 2013-08-14 DIAGNOSIS — K56609 Unspecified intestinal obstruction, unspecified as to partial versus complete obstruction: Secondary | ICD-10-CM

## 2013-08-14 DIAGNOSIS — R5383 Other fatigue: Secondary | ICD-10-CM

## 2013-08-14 LAB — CBC WITH DIFFERENTIAL/PLATELET
BASOS PCT: 0.2 % (ref 0.0–3.0)
Basophils Absolute: 0 10*3/uL (ref 0.0–0.1)
EOS PCT: 0.5 % (ref 0.0–5.0)
Eosinophils Absolute: 0 10*3/uL (ref 0.0–0.7)
HEMATOCRIT: 24.5 % — AB (ref 36.0–46.0)
LYMPHS ABS: 1.3 10*3/uL (ref 0.7–4.0)
LYMPHS PCT: 18.9 % (ref 12.0–46.0)
MCHC: 32.7 g/dL (ref 30.0–36.0)
MCV: 83.3 fl (ref 78.0–100.0)
MONOS PCT: 6.9 % (ref 3.0–12.0)
Monocytes Absolute: 0.5 10*3/uL (ref 0.1–1.0)
NEUTROS ABS: 5 10*3/uL (ref 1.4–7.7)
Neutrophils Relative %: 73.5 % (ref 43.0–77.0)
Platelets: 283 10*3/uL (ref 150.0–400.0)
RBC: 2.94 Mil/uL — AB (ref 3.87–5.11)
RDW: 17.1 % — ABNORMAL HIGH (ref 11.5–14.6)
WBC: 6.8 10*3/uL (ref 4.5–10.5)

## 2013-08-14 LAB — COMPREHENSIVE METABOLIC PANEL
ALBUMIN: 2.6 g/dL — AB (ref 3.5–5.2)
ALT: 29 U/L (ref 0–35)
AST: 43 U/L — AB (ref 0–37)
Alkaline Phosphatase: 67 U/L (ref 39–117)
BUN: 7 mg/dL (ref 6–23)
CALCIUM: 8.2 mg/dL — AB (ref 8.4–10.5)
CHLORIDE: 100 meq/L (ref 96–112)
CO2: 31 mEq/L (ref 19–32)
Creatinine, Ser: 1.1 mg/dL (ref 0.4–1.2)
GFR: 66.2 mL/min (ref >60.00)
GLUCOSE: 84 mg/dL (ref 70–99)
Potassium: 2.7 mEq/L — CL (ref 3.5–5.1)
Sodium: 138 mEq/L (ref 135–145)
Total Bilirubin: 0.8 mg/dL (ref 0.3–1.2)
Total Protein: 7.2 g/dL (ref 6.0–8.3)

## 2013-08-14 MED ORDER — POTASSIUM CHLORIDE CRYS ER 20 MEQ PO TBCR: 40.0000 meq | EXTENDED_RELEASE_TABLET | Freq: Every day | ORAL | Status: DC

## 2013-08-14 NOTE — Telephone Encounter (Signed)
[facility] called a critical HGB - 8.0. Results given to Dr Damita Dunnings.

## 2013-08-14 NOTE — Assessment & Plan Note (Signed)
Due to deconditioning from recent 2wk hospitalization.  Discussed HHPT referral, pt opts to monitor for now and will work on increasing activity around the house to slowly rebuild stamina.

## 2013-08-14 NOTE — Assessment & Plan Note (Signed)
Slowly resolving.  Check CMP today.

## 2013-08-14 NOTE — Patient Instructions (Addendum)
When surgeon says it's ok to start blood thinner to treat blood clot, let me know and we will start this. Start iron (ferrous sulfate) 325mg  once daily 30 min before a meal to help with your anemia. Return to see me in 3 weeks for follow up, sooner if needed. Keep legs elevated at home but also try to walk around the house to build up strength.

## 2013-08-14 NOTE — Assessment & Plan Note (Signed)
She did receive FFP and 2u pRBC during recent hospitalization. Remaining dyspneic with last Hgb 8s. Recheck CBC today. I also encouraged her to start iron - 325mg  Fe SO4 daily, after checking with surgery. Currently normal bowel habits.

## 2013-08-14 NOTE — Assessment & Plan Note (Signed)
Multiple abscesses, currently undergoing treatment with IV ertapenem x 1 month for recent septic shock, has f/u with ID later in the month.   Perihepatic culture grew microaerophilic strep.  Initial blood cultures grew CONS x1 and corynebacterium x1. She has HH and daughter who assist with IV abx.

## 2013-08-14 NOTE — Assessment & Plan Note (Signed)
Diet controlled in past. Wt Readings from Last 3 Encounters:  08/14/13 305 lb 4 oz (138.46 kg)  08/05/13 309 lb 15.5 oz (140.6 kg)  02/13/13 310 lb 8 oz (140.842 kg)

## 2013-08-14 NOTE — Assessment & Plan Note (Signed)
Last alb in hospital was 1.8 - consistent with significant malnutrition in setting of critical illness. Recheck today. Pt endorses decreased appetite, I encouraged 3 meals a day and if not attained then start glucerna supplementation.

## 2013-08-14 NOTE — Telephone Encounter (Signed)
[facility] called a critical K+ - 2.7. Results given to Dr Duncan. 

## 2013-08-14 NOTE — Assessment & Plan Note (Signed)
Now resolved.  She was critically ill during hospitalization but is now on the med.

## 2013-08-14 NOTE — Progress Notes (Signed)
Pre visit review using our clinic review tool, if applicable. No additional management support is needed unless otherwise documented below in the visit note. 

## 2013-08-14 NOTE — Progress Notes (Signed)
BP 144/90  Pulse 64  Temp(Src) 98.4 F (36.9 C) (Oral)  Wt 305 lb 4 oz (138.46 kg)   CC: hosp f/u transitional care visit  Subjective:    Patient ID: Leah Peterson, female    DOB: December 24, 1955, 58 y.o.   MRN: [id number]  HPI: Leah Peterson is a 58 y.o. female presenting on 08/14/2013 for Follow-up   Leah Peterson presents today as hospital f/u for recent prolonged hospitalization for diverticulitis complicated by multiple abdominal abscesses and severe septic shock, renal failure and small bowel obstruction and bilateral DVT tibial veins currently with IVC filter in place.  No current anticoagulant due to hepatic hematoma.  Culture grew microaerophilic strep.  Rec remove IVC filter in 6 mo and start [facility] when ok by surgery.  There was also concern for small bowel obstruction which subsequently resolved.  She did receive FFP and 2 units pRBC for her acute blood loss anemia from hemoperitoneum from infection.  Discharged on IV abx - invanz 1gm x1 month.  Negative MRSA treatment so vanc was stopped. Pending rpt CT tomorrow prior to removal of JP drain. Has HH involved.  Daughter CMA also helps with IV abx. Pneumovax received in hospital.  F/u appt with surgery scheduled for tomorrow with CT scan. Lab Results  Component Value Date   CREATININE 1.1 08/14/2013    Since she's been home, staying fatigued with decreased appetite but denies fevers/chills, abd pain, nausea/vomiting, dysuria, chest pain.  Staying short winded, coughing small amt while mucous up. Normal BM and passing gas, last BM was this morning and normal.  Admit date: 07/31/2013  Discharge date: 08/10/2013  F/u phone call: 08/13/2013 Recommendations for Outpatient Follow-up:  1. Patient to be discharged on IV antibiotics with Invanz 1 gram IV q daily for 1 month. Stop date: Sep 07, 2013 2. She will need a repeat CT of Abd/Pelvis when output from drain is less than 15 cc/day prior to drain removal per Interventional  Radiology 3. Home Health Services will assist with home infusion therapy  Discharge Diagnoses:  Principal Problem:  Severe sepsis with septic shock  Active Problems:  HYPERTENSION, BENIGN ESSENTIAL  Diet-controlled type 2 diabetes mellitus  Intraabdominal fluid collection  Abscess of abdominal cavity  Acute renal failure  Diverticulitis  Liver abscess  Sepsis(995.91)  SBO (small bowel obstruction)   History of present illness:  [name] is a 58 year old woman with a PMH of HTN and diverticulitis who presents with abdominal pain, weakness, chills. She began to feel unwell four days ago with 3 episodes of non-bloody diarrhea. The following day she began to experience chills and sweats which caused her to stay home from work. She then developed lower abdominal pain, lightheadedness, weakness and dyspnea with exertion. She also noticed a lump in her epigastric area that was painful to palpation. She reports decreased po intake but denies nausea, vomiting, headache or chest pain. Her urine has been dark for the past day but she denies dysuria or increased frequency. She notes decreased urinary production for the past few days.  Hospital Course:  Patient is a pleasant 58 year old female with a past medical history of diverticulitis who was admitted to the pulmonary critical care service on 07/31/2013 presenting with complaints of abdominal pain and diarrhea. She had a CT scan on admission that showed a 5.5 cm collection in the left lower pelvis contiguous with the uterus. There was concerns that this could reflect diverticular abscess. Also noted was a hypoattenuating mass like  lesion in the lateral segment of the left lobe of the liver measuring 9.3 cm in greatest dimension. Patient's initially was septic and thus admitted to the intensive care unit. General surgery was consulted. Interventional radiology was also consulted as she underwent core biopsy of left lower lobe mass, aspiration of  subcutaneous fluid collection and percutaneous drainage of perihepatic fluid. A 12 French drain to suction bulb left in place. Liver biopsy was negative for malignancy. Patient's hospitalization complicated by the development of deep venous thrombosis as venous duplex performed on 08/07/2013 showed deep venous thrombosis involving right posterior tibial vein and left posterior to tibial veins. She was not started on anticoagulation due to hepatic sub-capsular hematoma. Patient undergoing IVC filter placement on 08/09/2013. Cultures from perihepatic aspirate drawn 08/05/2013 growing Microaerophilic streptococci. Culture from subcutaneous aspiration grew multiple organisms, no staph aureus noted. Infectious disease consulted. ID recommended at least 1 month treatment with vancomycin and imipenem.  SIGNIFICANT EVENTS / STUDIES:  4/2 [place] Abd: Solid mass in hepatic segment 2 of 10.3cm demonstrating internal vascularity. DDx includes hepatocellular carcinoma, metastatic disease, or benign neoplasms including focal nodular hyperplasia and adenoma. Adjacent subcapsular complex cystic structure with fluid levels favoring subcapsular hematoma-almost certainly related to the adjacent mass lesion. When pt clinically able, evaluation with MRI Abd w/wo contrast is recommended to further assess solid mass.  4/2 CT Abd/Pelvis: Partial SBO, transition point not definitive but appears in left anterior pelvis where there are inflammatory changes. Also, 5.5cm collection in left lower pelvis contiguous with uterus. Pt had diverticulosis in this location previously and may reflect peridiverticular abscess. Extensive diverticulosis with an area of wall thickening of mid sigmoid colon which could support diverticulitis. Small amt of ascites. There is focal fluid collection between left lobe of liver and stomach. Also a small mass/fluid collection in subcutaneous fat of upper abdominal midline measuring 2.5 x 2.1cm. There is a hypo  attenuating mass-like lesion in lateral left lobe of liver measuring 9.3cm.  4/2 Repeat CT Abd/Pelvis with rectal contrast: communication of the sigmoid colon lumen to a small and thick walled abscess with fistula demonstrated from the sigmoid colon lumen after administration of rectal contrast. The liquefied central portion of this abscess only measures 1.8 cm. Multitude of other areas of free fluid and loculated fluid are seen in the peritoneal cavity with the largest focal fluid in the gastrohepatic space under the left lobe of the liver. Persistent large mass in the left lobe of the liver with associated subcutaneous nodule which may represent metastatic disease in the midline anterior subcutaneous fat.  4/6 IR: Core bx of left lobe liver mass, aspiration of subq fluid collection, perc drainage of perihepatic fluid. Returned to SDU hypotensive, tachycardic, with fevers/chills. PCCM called.  4/7 CT Abd/Pelvis wo contrast:  Interval placement of pigtail drainage catheter in the subhepatic region inferior to the left hepatic lobe. There is a small amt of bleeding into the left hepatic lobe mass. New gas within the mass is compatible with Gel-Foam from procedure or earlier today. Small anterior left hepatic lobe subcapsular hematoma with hematocrit level. Moderate hemoperitoneum is new. Probable abscess in the anterior anatomic pelvis superior to the dome of the urinary bladder.  4/6 FNA of hepatic/perihepatic fluid: Cytology reveals no malignant cells; acute inflammation.  4/8 LE Dopplers: >> DVT in bilateral PTV's. Echo: EF 50 - 60%, no RWMA.  4/9 IVC FIlter  CULTURES:  MRSA 4/6>> NEG  BC 4/1>>1/2 gpc in pairs, 2/2 gpr>>2/2 Cornyebacterium sp  UC 4/1>>NG FINAL  Abscess (perihepatic) 4/6 >>>  Abscess (SQ fluid) 4/6>>  Hepatic mass/perihepatic fluid cytology 4/6 >>>no malignant cells; acute inflammation  Consultations:  PCCM  Infectious Disease  Interventional Radiology  General  Surgery  Relevant past medical, surgical, family and social history reviewed and updated as indicated.  Allergies and medications reviewed and updated. Current Outpatient Prescriptions on File Prior to Visit  Medication Sig  . amLODipine (NORVASC) 5 MG tablet Take 5 mg by mouth daily.  . cetirizine (ZYRTEC) 10 MG tablet Take 10 mg by mouth daily as needed.    . ertapenem 1 g in sodium chloride 0.9 % 50 mL Inject 1 g into the vein daily.  . metoprolol tartrate (LOPRESSOR) 25 MG tablet Take 1 tablet (25 mg total) by mouth 2 (two) times daily.  . Multiple Vitamin (MULTIVITAMIN WITH MINERALS) TABS tablet Take 1 tablet by mouth daily.  Marland Kitchen oxyCODONE-acetaminophen (ROXICET) 5-325 MG per tablet Take 1 tablet by mouth every 6 (six) hours as needed.  . cholecalciferol (VITAMIN D) 1000 UNITS tablet Take 1,000 Units by mouth daily.   Marland Kitchen dicyclomine (BENTYL) 10 MG capsule Take 1 capsule (10 mg total) by mouth 4 (four) times daily as needed.   No current facility-administered medications on file prior to visit.    Review of Systems Per HPI unless specifically indicated above    Objective:    BP 144/90  Pulse 64  Temp(Src) 98.4 F (36.9 C) (Oral)  Wt 305 lb 4 oz (138.46 kg)  Physical Exam  Nursing note and vitals reviewed. Constitutional: She appears well-developed and well-nourished. No distress.  Unable to get on exam table despite assistance.  HENT:  Mouth/Throat: Oropharynx is clear and moist. No oropharyngeal exudate.  Eyes: Conjunctivae and EOM are normal. Pupils are equal, round, and reactive to light. No scleral icterus.  Neck: Normal range of motion. Neck supple.  Cardiovascular: Normal rate, regular rhythm and intact distal pulses.   Murmur (2/6 SEM) heard. Pulmonary/Chest: Effort normal and breath sounds normal. No respiratory distress. She has no wheezes. She has no rales.  Abdominal: Soft. Bowel sounds are normal. She exhibits no distension. There is no tenderness. There is no  rebound and no guarding.  JP drain in place mid abdomen, dressings c/d/i  Musculoskeletal: She exhibits edema (bilat dorsal pedal edema).  Lymphadenopathy:    She has no cervical adenopathy.  Skin: Skin is warm and dry. No rash noted. She is not diaphoretic.       Assessment & Plan:   Problem List Items Addressed This Visit   Diet-controlled type 2 diabetes mellitus      Diet controlled in past. Wt Readings from Last 3 Encounters:  08/14/13 305 lb 4 oz (138.46 kg)  08/05/13 309 lb 15.5 oz (140.6 kg)  02/13/13 310 lb 8 oz (140.842 kg)      Abscess of abdominal cavity - Primary     Multiple abscesses, currently undergoing treatment with IV ertapenem x 1 month for recent septic shock, has f/u with ID later in the month.   Perihepatic culture grew microaerophilic strep.  Initial blood cultures grew CONS x1 and corynebacterium x1. She has HH and daughter who assist with IV abx.    Relevant Orders      HIV antibody   Acute renal failure     Slowly resolving.  Check CMP today.    Relevant Orders      Comprehensive metabolic panel (Completed)   History of diverticulitis   Acute blood loss anemia  She did receive FFP and 2u pRBC during recent hospitalization. Remaining dyspneic with last Hgb 8s. Recheck CBC today. I also encouraged her to start iron - 325mg  Fe SO4 daily, after checking with surgery. Currently normal bowel habits.    Relevant Medications      ferrous sulfate 325 (65 FE) MG tablet   Other Relevant Orders      CBC with Differential (Completed)   Protein-calorie malnutrition, severe     Last alb in hospital was 1.8 - consistent with significant malnutrition in setting of critical illness. Recheck today. Pt endorses decreased appetite, I encouraged 3 meals a day and if not attained then start glucerna supplementation.    Relevant Orders      Comprehensive metabolic panel (Completed)   General weakness     Due to deconditioning from recent 2wk  hospitalization.  Discussed HHPT referral, pt opts to monitor for now and will work on increasing activity around the house to slowly rebuild stamina.    RESOLVED: Severe sepsis with septic shock     Now resolved.  She was critically ill during hospitalization but is now on the med.    RESOLVED: SBO (small bowel obstruction)     Now resolved.        Follow up plan: Return in about 3 weeks (around 09/04/2013), or as needed, for follow up.

## 2013-08-14 NOTE — Assessment & Plan Note (Signed)
Now resolved.  

## 2013-08-14 NOTE — Telephone Encounter (Signed)
Patient notified as instructed by telephone. Follow-up lab appointment scheduled.

## 2013-08-14 NOTE — Telephone Encounter (Signed)
Please call Pt. K is low.  Hgb is low but similar to prev.  I would replete her K, rx below and repeat labs on Friday afternoon, either here or in [place]. Thanks.  Please have her take 2 K dur pills tonight.

## 2013-08-15 ENCOUNTER — Ambulatory Visit
Admission: RE | Admit: 2013-08-15 | Discharge: 2013-08-15 | Disposition: A | Discharge status: Home / Self Care | Payer: BC Managed Care – PPO | Source: Ambulatory Visit | Attending: Radiology | Admitting: Radiology

## 2013-08-15 ENCOUNTER — Ambulatory Visit
Admission: RE | Admit: 2013-08-15 | Discharge: 2013-08-15 | Disposition: A | Discharge status: Home / Self Care | Payer: BC Managed Care – PPO | Source: Ambulatory Visit | Attending: Surgery | Admitting: Surgery

## 2013-08-15 DIAGNOSIS — K650 Generalized (acute) peritonitis: Secondary | ICD-10-CM

## 2013-08-15 LAB — HIV ANTIBODY (ROUTINE TESTING W REFLEX): HIV 1&2 Ab, 4th Generation: NONREACTIVE

## 2013-08-15 MED ORDER — IOHEXOL 300 MG/ML  SOLN
125.0000 mL | Freq: Once | INTRAMUSCULAR | Status: AC | PRN
  Administered 2013-08-15: 125 mL via INTRAVENOUS

## 2013-08-16 ENCOUNTER — Other Ambulatory Visit (INDEPENDENT_AMBULATORY_CARE_PROVIDER_SITE_OTHER): Payer: BC Managed Care – PPO

## 2013-08-16 ENCOUNTER — Other Ambulatory Visit: Payer: Self-pay | Admitting: Family Medicine

## 2013-08-16 DIAGNOSIS — D649 Anemia, unspecified: Secondary | ICD-10-CM

## 2013-08-16 DIAGNOSIS — E876 Hypokalemia: Secondary | ICD-10-CM

## 2013-08-16 LAB — HEMOGLOBIN

## 2013-08-16 LAB — POTASSIUM: Potassium: 3.8 mEq/L (ref 3.5–5.1)

## 2013-08-20 ENCOUNTER — Encounter: Payer: Self-pay | Admitting: Family Medicine

## 2013-08-22 ENCOUNTER — Telehealth: Payer: Self-pay | Admitting: Family Medicine

## 2013-08-22 NOTE — Telephone Encounter (Signed)
FMLA forms to be completed in your in-box upon your return. Please return to [place] when finished.

## 2013-08-23 ENCOUNTER — Encounter (INDEPENDENT_AMBULATORY_CARE_PROVIDER_SITE_OTHER): Payer: Self-pay | Admitting: Surgery

## 2013-08-23 ENCOUNTER — Ambulatory Visit (INDEPENDENT_AMBULATORY_CARE_PROVIDER_SITE_OTHER): Payer: BC Managed Care – PPO | Admitting: Surgery

## 2013-08-23 VITALS — BP 138/82 | HR 80 | Temp 97.6°F | Resp 16 | Ht 62.0 in | Wt 295.4 lb

## 2013-08-23 DIAGNOSIS — K651 Peritoneal abscess: Secondary | ICD-10-CM

## 2013-08-23 NOTE — Progress Notes (Signed)
Patient ID: Leah Peterson, female   DOB: 01/10/1956, 58 y.o.   MRN: [id number]  Chief Complaint  Patient presents with  . Routine Post Op    HPI Leah Peterson is a 58 y.o. female.  Patient is here for hospital followup after being admitted to [facility] 3 weeks ago for abdominal pain. She was found to have diverticulitis with microperforation abscess. She also had a large liver mass was biopsied and found to be inflammation. Is felt to be secondary to an abscess. She had a percutaneous drain placed in a perihepatic collection which helped. She was managed in the medical service primarily. She improved on medical management. She's not felt to be a good surgical candidate due to her medical problems. She is here today for followup. She is doing fairly well with minimal abdominal pain. She feels bloated at times. Bowels are moving that she requires MiraLAX. No severe abdominal pain this point. HPI  Past Medical History  Diagnosis Date  . Diverticulosis of colon (without mention of hemorrhage)   . Pure hypercholesterolemia   . Other abnormal glucose   . Essential hypertension, benign   . IBS (irritable bowel syndrome)   . Obesity, unspecified   . Unspecified vitamin D deficiency   . Left knee pain 2012    eval by ortho - bone spurs and DJD, treated with steroid shot which helped temporarily  . Liver abscess 2015    hospitalization for severe septic shock after diverticulitis  . History of diverticulitis   . DVT (deep venous thrombosis) 2015    bilateral posterior tibial veins treated with IVC filter    Past Surgical History  Procedure Laterality Date  . Cesarean section      due to BP elevation  . Colonoscopy  2000    Diverticuli-Dr. Sharlett Iles    Family History  Problem Relation Age of Onset  . Hypertension Father   . Cancer Father     prostate  . Heart disease Father     CABG x 2  . Cancer Mother     Lung-quit smoking 20 years prior to Dx  . Hypertension Mother     . Diabetes      + family hx  . Drug abuse Brother   . Stroke Maternal Grandmother   . Sleep apnea Brother     with [employer] estranged    Social History History  Substance Use Topics  . Smoking status: Former Research scientist (life sciences)  . Smokeless tobacco: Never Used  . Alcohol Use: Yes     Comment: wine on occasion     Allergies  Allergen Reactions  . Lisinopril     REACTION: lethargy, cough.  . Penicillins     REACTION: LYMPH NODES SWELL    Current Outpatient Prescriptions  Medication Sig Dispense Refill  . amLODipine (NORVASC) 5 MG tablet Take 5 mg by mouth daily.      . cetirizine (ZYRTEC) 10 MG tablet Take 10 mg by mouth daily as needed.        . cholecalciferol (VITAMIN D) 1000 UNITS tablet Take 1,000 Units by mouth daily.       Marland Kitchen dicyclomine (BENTYL) 10 MG capsule Take 1 capsule (10 mg total) by mouth 4 (four) times daily as needed.  30 capsule  1  . ertapenem 1 g in sodium chloride 0.9 % 50 mL Inject 1 g into the vein daily.  1 mL  0  . ferrous sulfate 325 (65 FE) MG tablet Take 325 mg  by mouth daily with breakfast.      . metoprolol tartrate (LOPRESSOR) 25 MG tablet Take 1 tablet (25 mg total) by mouth 2 (two) times daily.  60 tablet  1  . Multiple Vitamin (MULTIVITAMIN WITH MINERALS) TABS tablet Take 1 tablet by mouth daily.      Marland Kitchen oxyCODONE-acetaminophen (ROXICET) 5-325 MG per tablet Take 1 tablet by mouth every 6 (six) hours as needed.  15 tablet  0   No current facility-administered medications for this visit.    Review of Systems Review of Systems  Respiratory: Positive for shortness of breath.   Cardiovascular: Positive for leg swelling.  Gastrointestinal: Negative.     Blood pressure 138/82, pulse 80, temperature 97.6 F (36.4 C), resp. rate 16, height 5\' 2"  (1.575 m), weight 295 lb 6.4 oz (133.993 kg).  Physical Exam Physical Exam  Constitutional: She is oriented to person, place, and time. No distress.  Eyes: Pupils are equal, round, and reactive to light. No scleral  icterus.  Abdominal: She exhibits distension.    Musculoskeletal: Normal range of motion.  Neurological: She is alert and oriented to person, place, and time.  Skin: Skin is warm and dry.  Psychiatric: She has a normal mood and affect. Her behavior is normal. Judgment and thought content normal.    Data Reviewed CLINICAL DATA: Evaluate pelvic abscess and perihepatic abscess.  Evaluate for perihepatic drain removal.  EXAM:  CT ABDOMEN AND PELVIS WITH CONTRAST  TECHNIQUE:  Multidetector CT imaging of the abdomen and pelvis was performed  using the standard protocol following bolus administration of  intravenous contrast.  CONTRAST: 121mL OMNIPAQUE IOHEXOL 300 MG/ML SOLN  COMPARISON: Abdominal CT 08/05/2013 and abdominal CT 08/01/2013  FINDINGS:  Lung bases are clear except for mild left pleural thickening or a  small amount of left pleural fluid. The previously biopsied left  hepatic lesion has decreased in size. This lesion measures 6.9 x  10.2 x 5.8 cm and previously measured 13.8 x 10.0 x 9.5 cm. Biopsy  demonstrated acute inflammation and necrosis in this area. There is  decreased gas along the peripheral aspect of this lesion which could  be related to the prior biopsy and Gelfoam placement. However, the  drainage catheter that was in the left perihepatic fluid collection  has been dislodged and the drain now is within the anterior  peritoneal cavity. The catheter is no longer draining the  perihepatic fluid collection. As result, the perihepatic collection  has re-accumulated. This low-density collection measures 6.2 x 6.9  cm on sequence 3, image 29.  Hepatic subcapsular collections are again noted along the anterior  left hepatic lobe and the lateral right hepatic lobe. These  collections have slightly enlarged in size. The left anterior  collection measures 3.6 cm in depth and previously measured 3.5 cm.  The anterior collection is heterogeneous and most compatible with  a  subcapsular hematomas. The right hepatic subcapsular fluid  collection is low density. The hemoperitoneum in the left abdomen is  less dense and appears to be resolving. There continues to be  hyperdense fluid in the left abdomen and left upper quadrant. There  is a moderate amount of fluid in the lower abdomen with an air  collection above the urinary bladder that has decreased in size.  This collection roughly measures 1.8 x 3.0 x 2.5 cm and previously  measured 4.3 x 5.6 x 4.6 cm. There are also air collections along  the left side of the sigmoid colon and expected  region of the left  adnexa. Largest air collection roughly measures 3.3 x 2.6 cm on  sequence 3, image 60. This area roughly measured 3.6 x 3.2 cm on the  previous examination. There is a small adjacent gas collection which  could be associated with a colonic diverticulum. Again noted is an  irregular and nodular uterus.  Gallbladder is poorly visualized. The main portal venous system is  patent. Patient has an IVC filter which is well positioned below the  renal veins. No gross abnormality to the adrenal gland or kidneys.  No gross abnormality to the spleen or pancreas. Extensive  diverticulosis in the sigmoid colon.  There is subcutaneous edema along the anterior abdomen. No acute  bone abnormality.  IMPRESSION:  The drainage catheter in the perihepatic fluid collection is no  longer within the collection. As result, there has been  re-accumulation of the fluid collection in the left inferior  perihepatic space. This collection measures up to 6.9 cm. The  drainage catheter was removed after the CT procedure.  The inflammatory process or lesion involving the left hepatic lobe  has decreased in size. Again noted are post biopsy changes along the  margin of this lesion.  The hepatic subcapsular hematoma has minimally changed in size.  There has been some evolution and resolution of the hemoperitoneum  in the left  abdomen.  Decreased size of the gas-filled collections in the pelvis. Findings  could represent resolving abscesses.  Diverticulosis.  Electronically Signed  By: Markus Daft M.D.  On: 08/15/2013 15:54   Assessment    History of diverticulitis with microperforation and pelvic abscess resolving  Liver mass biopsy proven to be inflammation probably secondary to abscess  Liver hematoma resolving  Multiple medical problems    Plan    The patient is clinically improved. She would not tolerate laparotomy for any these issues. Recommend continue medical management. Followup CT in 6 weeks as long as medical condition permits. Followup in 6 weeks. Call with worsening abdominal pain, nausea or vomiting. See immediate medical attention if abdominal pain becomes severe in the emergency room.       Bralyn Espino A. Eudora Guevarra 08/23/2013, 12:16 PM

## 2013-08-23 NOTE — Patient Instructions (Signed)
Return in 6 weeks for follow up.  Repeat CT in 6 weeks prior to return.

## 2013-08-25 DIAGNOSIS — Z0279 Encounter for issue of other medical certificate: Secondary | ICD-10-CM

## 2013-08-25 NOTE — Telephone Encounter (Signed)
Filled and placed in my out box. 

## 2013-08-26 NOTE — Telephone Encounter (Signed)
Faxed paperwork to [phone] on 08/26/13. Spoke with patient and informed her that I had faxed it and a copy was ready for her to pick up at front office.

## 2013-08-27 ENCOUNTER — Encounter: Payer: Self-pay | Admitting: Infectious Diseases

## 2013-08-27 ENCOUNTER — Encounter (INDEPENDENT_AMBULATORY_CARE_PROVIDER_SITE_OTHER): Payer: BC Managed Care – PPO | Admitting: General Surgery

## 2013-08-27 ENCOUNTER — Ambulatory Visit (INDEPENDENT_AMBULATORY_CARE_PROVIDER_SITE_OTHER): Payer: BC Managed Care – PPO | Admitting: Infectious Diseases

## 2013-08-27 ENCOUNTER — Telehealth: Payer: Self-pay

## 2013-08-27 ENCOUNTER — Encounter: Payer: Self-pay | Admitting: Family Medicine

## 2013-08-27 ENCOUNTER — Telehealth: Payer: Self-pay | Admitting: *Deleted

## 2013-08-27 VITALS — BP 162/90 | HR 96 | Temp 98.4°F | Ht 62.0 in | Wt 288.0 lb

## 2013-08-27 DIAGNOSIS — K750 Abscess of liver: Secondary | ICD-10-CM

## 2013-08-27 DIAGNOSIS — E119 Type 2 diabetes mellitus without complications: Secondary | ICD-10-CM

## 2013-08-27 DIAGNOSIS — I10 Essential (primary) hypertension: Secondary | ICD-10-CM

## 2013-08-27 NOTE — Assessment & Plan Note (Signed)
Does not check FSG at home. Has had decreased appetite.

## 2013-08-27 NOTE — Telephone Encounter (Signed)
Relevant patient education assigned to patient using Emmi. ° °

## 2013-08-27 NOTE — Assessment & Plan Note (Signed)
BP elevated today, she will f/u with PCP, she is asx.

## 2013-08-27 NOTE — Assessment & Plan Note (Signed)
She is doing  Better. She has f/u CT scan scheduled for 6-1 and surgical f/u on 6-15. Will set her up to stop her anbx and have her PIC pulled on same date. Will have her f/u with ID after she has her CT scan.

## 2013-08-27 NOTE — Telephone Encounter (Signed)
Per Dr Johnnye Sima called [facility] to verify the stop date of 09/07/13 for antibiotic and to pull PICC then also. Also advised we have not received any labs on the patient and advised them to send them asap.

## 2013-08-27 NOTE — Progress Notes (Signed)
   Subjective:    Patient ID: Leah Peterson, female    DOB: 1955/10/27, 58 y.o.   MRN: [id number]  HPI 58 y.o. female with a PMHx HTN, diverticulitis (last treatment one year ago), IBS, and obesity admitted [facility] 07-31-13 with abdominal pain and diarrhea noted to be hypotensive (80's/60's) and elevated lactic acid 3.27. Abd CT showed diverticulitis and free fluid collection with largest one 9.3 cm at left lobe of liver. Went for [place] bx of liver mass 4/6 which aspirated pus. She had a drain placed at that time. She returned and was hypotensive with fevers/chills. She was started on vanco, cipro and flagyl.  Her BCx from admission grew CNS in one Cx, Diphtheroids in one Cx.  The hepatic abscess Cx grew micro-aerophillic strep in one Cx and polymicrobial in second Cx. She was transitioned to [facility] (stop date 09-07-13) and was d/c home on 4-12. Her liver drain was removed on 08-15-13. Today she complains of abd tightness and bloating. Has had no problems with anbx. Her daughter has been helping her do her IV anbx. No f/c. Having normal BM. No yeast infections or oral ulcers.  Had some bleeding around [facility] site (which resolved) but no d/c, tenderness or slow infusions.  States she has f/u CT scan scheduled for 09-30-13.   Review of Systems  Constitutional: Negative for fever and chills.  Respiratory: Negative for shortness of breath.   Cardiovascular: Negative for chest pain.  Gastrointestinal: Negative for diarrhea and constipation.  Genitourinary: Negative for difficulty urinating.  Neurological: Negative for headaches.       Objective:   Physical Exam  Constitutional: She appears well-developed and well-nourished.  HENT:  Mouth/Throat: No oropharyngeal exudate.  Eyes: EOM are normal. Pupils are equal, round, and reactive to light.  Neck: Neck supple.  Cardiovascular: Normal rate, regular rhythm and normal heart sounds.   Pulmonary/Chest: Effort normal and breath sounds normal.  Abdominal:  Soft. Bowel sounds are normal. She exhibits no distension. There is no tenderness.  Scar from drain is well healed. Non-tender, no fluctuance.   Musculoskeletal: She exhibits edema.  Lymphadenopathy:    She has no cervical adenopathy.          Assessment & Plan:

## 2013-09-04 ENCOUNTER — Encounter: Payer: Self-pay | Admitting: Family Medicine

## 2013-09-04 ENCOUNTER — Ambulatory Visit (INDEPENDENT_AMBULATORY_CARE_PROVIDER_SITE_OTHER): Payer: BC Managed Care – PPO | Admitting: Family Medicine

## 2013-09-04 ENCOUNTER — Telehealth: Payer: Self-pay | Admitting: Family Medicine

## 2013-09-04 VITALS — BP 136/88 | HR 84 | Temp 98.9°F | Wt 281.5 lb

## 2013-09-04 DIAGNOSIS — I10 Essential (primary) hypertension: Secondary | ICD-10-CM

## 2013-09-04 DIAGNOSIS — E43 Unspecified severe protein-calorie malnutrition: Secondary | ICD-10-CM

## 2013-09-04 DIAGNOSIS — K750 Abscess of liver: Secondary | ICD-10-CM

## 2013-09-04 DIAGNOSIS — R5381 Other malaise: Secondary | ICD-10-CM

## 2013-09-04 DIAGNOSIS — L538 Other specified erythematous conditions: Secondary | ICD-10-CM

## 2013-09-04 DIAGNOSIS — E119 Type 2 diabetes mellitus without complications: Secondary | ICD-10-CM

## 2013-09-04 DIAGNOSIS — R531 Weakness: Secondary | ICD-10-CM

## 2013-09-04 DIAGNOSIS — L304 Erythema intertrigo: Secondary | ICD-10-CM | POA: Insufficient documentation

## 2013-09-04 DIAGNOSIS — R5383 Other fatigue: Secondary | ICD-10-CM

## 2013-09-04 DIAGNOSIS — D62 Acute posthemorrhagic anemia: Secondary | ICD-10-CM

## 2013-09-04 MED ORDER — CLOTRIMAZOLE 1 % EX CREA: 1.0000 "application " | TOPICAL_CREAM | Freq: Two times a day (BID) | CUTANEOUS | Status: DC

## 2013-09-04 NOTE — Assessment & Plan Note (Signed)
Stable  Continue current regimen  

## 2013-09-04 NOTE — Progress Notes (Addendum)
BP 136/88  Pulse 84  Temp(Src) 98.9 F (37.2 C) (Oral)  Wt 281 lb 8 oz (127.688 kg)   CC: 3 wk f/u  Subjective:    Patient ID: Leah Peterson, female    DOB: 1955-09-11, 58 y.o.   MRN: [id number]  HPI: Leah Peterson is a 58 y.o. female presenting on 09/04/2013 for Follow-up and Rash   See prior note for details. Planned PICC line removal 09/07/2013 and to stop IV abx then too. All drains are out now. IVC filter in place - pending start anticoagulation for bilateral posterior tibial DVTs. Blood work every Monday by IV nurse.  Recent LFT, Cr WNL.  Hgb staying at 8.3  Noticing strength slowly improving. However persistent weakness noted, mild dyspnea  Noticing rash right axilla as well as anterior thighs bilaterally.  Tender, not itchy.  Hasn't tried anything other than alcohol for this.   Relevant past medical, surgical, family and social history reviewed and updated as indicated.  Allergies and medications reviewed and updated. Current Outpatient Prescriptions on File Prior to Visit  Medication Sig  . amLODipine (NORVASC) 5 MG tablet Take 5 mg by mouth daily.  . cetirizine (ZYRTEC) 10 MG tablet Take 10 mg by mouth daily as needed.    . cholecalciferol (VITAMIN D) 1000 UNITS tablet Take 1,000 Units by mouth daily.   . ertapenem 1 g in sodium chloride 0.9 % 50 mL Inject 1 g into the vein daily.  . ferrous sulfate 325 (65 FE) MG tablet Take 325 mg by mouth daily with breakfast.  . metoprolol tartrate (LOPRESSOR) 25 MG tablet Take 1 tablet (25 mg total) by mouth 2 (two) times daily.  . Multiple Vitamin (MULTIVITAMIN WITH MINERALS) TABS tablet Take 1 tablet by mouth daily.  Marland Kitchen dicyclomine (BENTYL) 10 MG capsule Take 1 capsule (10 mg total) by mouth 4 (four) times daily as needed.   No current facility-administered medications on file prior to visit.    Review of Systems Per HPI unless specifically indicated above    Objective:    BP 136/88  Pulse 84  Temp(Src) 98.9  F (37.2 C) (Oral)  Wt 281 lb 8 oz (127.688 kg)  Physical Exam  Nursing note and vitals reviewed. Constitutional: She appears well-developed and well-nourished. No distress.  Morbidly obese Slowed by able to get on exam table on her own  HENT:  Mouth/Throat: Oropharynx is clear and moist. No oropharyngeal exudate.  Cardiovascular: Normal rate, regular rhythm and intact distal pulses.   Murmur (soft flow systolic murmur) heard. Pulmonary/Chest: Effort normal and breath sounds normal. No respiratory distress. She has no wheezes. She has no rales.  Musculoskeletal: She exhibits no edema.  Skin: Skin is warm and dry. Rash noted.  R axilla with hyperpigmented rash Anterior upper thighs with serpiginous hyperpigmented rash  Psychiatric: She has a normal mood and affect.       Assessment & Plan:   Problem List Items Addressed This Visit   HYPERTENSION, BENIGN ESSENTIAL     Stable. Continue current regimen.    Diet-controlled type 2 diabetes mellitus      Lab Results  Component Value Date   HGBA1C 6.6* 09/12/2012  will be due for recheck next labwork.  Anticipate good control based on recent weight loss.    Liver abscess - Primary     F/u scheduled with ID and surgery (6/1 and 6/15).  Also has f/u CT scan scheduled for 6/1 Recommended out of work until f/u CT.  Acute blood loss anemia     Denies recent blood loss - continue to monitor with regular CBC.  Await this week's readings. Improving dyspnea. Continue iron daily.    Protein-calorie malnutrition, severe      Improving - last albumin 2.7. Wt Readings from Last 3 Encounters:  09/04/13 281 lb 8 oz (127.688 kg)  08/27/13 288 lb (130.636 kg)  08/23/13 295 lb 6.4 oz (133.993 kg)      General weakness     Slow improvement.    Intertrigo     Treat with clotrimazole cream bid for next 2-4 weeks.    Relevant Medications      clotrimazole (LOTRIMIN) 1 % cream       Follow up plan: Return in about 6 weeks (around  10/16/2013), or as needed, for follow up.

## 2013-09-04 NOTE — Assessment & Plan Note (Signed)
Lab Results  Component Value Date   HGBA1C 6.6* 09/12/2012  will be due for recheck next labwork.  Anticipate good control based on recent weight loss.

## 2013-09-04 NOTE — Assessment & Plan Note (Signed)
F/u scheduled with ID and surgery (6/1 and 6/15).  Also has f/u CT scan scheduled for 6/1 Recommended out of work until f/u CT.

## 2013-09-04 NOTE — Assessment & Plan Note (Signed)
Improving - last albumin 2.7. Wt Readings from Last 3 Encounters:  09/04/13 281 lb 8 oz (127.688 kg)  08/27/13 288 lb (130.636 kg)  08/23/13 295 lb 6.4 oz (133.993 kg)

## 2013-09-04 NOTE — Patient Instructions (Signed)
Keep appointment with Dr. Drucilla Schmidt and Dr. Brantley Stage Return to see me afterwards (after 10/14/2013) Continue iron pill daily. I think you are doing well.

## 2013-09-04 NOTE — Assessment & Plan Note (Signed)
Denies recent blood loss - continue to monitor with regular CBC.  Await this week's readings. Improving dyspnea. Continue iron daily.

## 2013-09-04 NOTE — Telephone Encounter (Signed)
Relevant patient education assigned to patient using Emmi. ° °

## 2013-09-04 NOTE — Progress Notes (Signed)
Pre visit review using our clinic review tool, if applicable. No additional management support is needed unless otherwise documented below in the visit note. 

## 2013-09-04 NOTE — Assessment & Plan Note (Signed)
Treat with clotrimazole cream bid for next 2-4 weeks.

## 2013-09-04 NOTE — Assessment & Plan Note (Signed)
Slow improvement 

## 2013-09-20 ENCOUNTER — Encounter: Payer: Self-pay | Admitting: Infectious Diseases

## 2013-09-26 ENCOUNTER — Other Ambulatory Visit: Payer: Self-pay

## 2013-09-26 DIAGNOSIS — Z1231 Encounter for screening mammogram for malignant neoplasm of breast: Secondary | ICD-10-CM

## 2013-09-30 ENCOUNTER — Encounter (INDEPENDENT_AMBULATORY_CARE_PROVIDER_SITE_OTHER): Payer: BC Managed Care – PPO | Admitting: Surgery

## 2013-09-30 ENCOUNTER — Ambulatory Visit
Admission: RE | Admit: 2013-09-30 | Discharge: 2013-09-30 | Disposition: A | Discharge status: Home / Self Care | Payer: BC Managed Care – PPO | Source: Ambulatory Visit | Attending: Surgery | Admitting: Surgery

## 2013-09-30 DIAGNOSIS — K651 Peritoneal abscess: Secondary | ICD-10-CM

## 2013-09-30 MED ORDER — IOHEXOL 300 MG/ML  SOLN
125.0000 mL | Freq: Once | INTRAMUSCULAR | Status: AC | PRN
  Administered 2013-09-30: 125 mL via INTRAVENOUS

## 2013-10-02 ENCOUNTER — Encounter: Payer: Self-pay | Admitting: Infectious Disease

## 2013-10-02 ENCOUNTER — Telehealth (INDEPENDENT_AMBULATORY_CARE_PROVIDER_SITE_OTHER): Payer: Self-pay

## 2013-10-02 ENCOUNTER — Ambulatory Visit (INDEPENDENT_AMBULATORY_CARE_PROVIDER_SITE_OTHER): Payer: BC Managed Care – PPO | Admitting: Infectious Disease

## 2013-10-02 VITALS — BP 140/87 | HR 76 | Temp 97.6°F | Wt 266.0 lb

## 2013-10-02 DIAGNOSIS — A491 Streptococcal infection, unspecified site: Secondary | ICD-10-CM

## 2013-10-02 DIAGNOSIS — K5732 Diverticulitis of large intestine without perforation or abscess without bleeding: Secondary | ICD-10-CM

## 2013-10-02 DIAGNOSIS — K5780 Diverticulitis of intestine, part unspecified, with perforation and abscess without bleeding: Secondary | ICD-10-CM

## 2013-10-02 DIAGNOSIS — K750 Abscess of liver: Secondary | ICD-10-CM

## 2013-10-02 NOTE — Telephone Encounter (Signed)
LMOM> CT results below.  

## 2013-10-02 NOTE — Progress Notes (Signed)
Subjective:    Patient ID: Leah Peterson, female    DOB: 1955-10-04, 58 y.o.   MRN: [id number]  HPI  58 y.o. female with a PMHx HTN, diverticulitis (last treatment one year ago), IBS, and obesity admitted [facility] 07-31-13 with abdominal pain and diarrhea noted to be hypotensive (80's/60's) and elevated lactic acid 3.27. Abd CT showed diverticulitis and free fluid collection with largest one 9.3 cm at left lobe of liver. Went for [place] bx of liver mass 4/6 which aspirated pus. She had a drain placed at that time. She returned and was hypotensive with fevers/chills. She was started on vanco, cipro and flagyl.  Her BCx from admission grew CNS in one Cx, Diphtheroids in one Cx.  The hepatic abscess Cx grew micro-aerophillic strep in one Cx and polymicrobial in second Cx. She was transitioned to [facility] (stop date 09-07-13) and was d/c home on 4-12. Her liver drain was removed on 08-15-13.   She was seen in followup by my partner Dr. Johnnye Sima on  08/27/13 and IV abx and PICC were pulled on 09/07/13.   She has had Ct scan done on 09/30/13 which shows:  Previously identified large subcapsular fluid collection has  diminished significantly in size and now measures 4.5 x 2.1 cm. No  focal hepatic abnormality noted on today's exam. Previously  identified hepatic fluid collection has resolved. Previously  identified peritoneal fluid collections have resolved  She is without fevers, chills or malaise.   Review of Systems  Constitutional: Negative for fever, chills, diaphoresis, activity change, appetite change, fatigue and unexpected weight change.  HENT: Negative for congestion.   Eyes: Negative for photophobia and visual disturbance.  Respiratory: Negative for cough, shortness of breath, wheezing and stridor.   Cardiovascular: Negative for leg swelling.  Gastrointestinal: Positive for abdominal distention. Negative for nausea, vomiting, abdominal pain, diarrhea, constipation, blood in stool and anal  bleeding.  Genitourinary: Negative for dysuria, hematuria, flank pain and difficulty urinating.  Musculoskeletal: Negative for arthralgias, back pain, gait problem, joint swelling and myalgias.  Skin: Negative for color change, pallor, rash and wound.  Neurological: Negative for dizziness, tremors, weakness and light-headedness.  Hematological: Negative for adenopathy. Does not bruise/bleed easily.  Psychiatric/Behavioral: Negative for behavioral problems, confusion, sleep disturbance, dysphoric mood, decreased concentration and agitation.       Objective:   Physical Exam  Constitutional: She is oriented to person, place, and time. She appears well-developed and well-nourished. No distress.  HENT:  Head: Normocephalic and atraumatic.  Mouth/Throat: Oropharynx is clear and moist. No oropharyngeal exudate.  Eyes: Conjunctivae and EOM are normal. No scleral icterus.  Neck: Normal range of motion. Neck supple. No JVD present.  Cardiovascular: Normal rate, regular rhythm and normal heart sounds.   Pulmonary/Chest: Effort normal. No respiratory distress. She has no wheezes.  Abdominal: Soft. Bowel sounds are normal. She exhibits no distension and no mass. There is no tenderness. There is no guarding.  Musculoskeletal: She exhibits edema. She exhibits no tenderness.  Lymphadenopathy:    She has no cervical adenopathy.  Neurological: She is alert and oriented to person, place, and time. She exhibits normal muscle tone. Coordination normal.  Skin: Skin is warm and dry. She is not diaphoretic. No erythema. No pallor.  Psychiatric: She has a normal mood and affect. Her behavior is normal. Judgment and thought content normal.          Assessment & Plan:   Diverticulitis and perforation then liver abscess with microaerophillic streptococci isolated on culture   --  she received IV antibiotics including Invanz thru 09/07/13 essentially a month post drainage.   She has had no worrisome ssx in  past 3 weeks off abx and fluid in liver is dramatically decreased  After much discussion she wished to remain off antibiotics and be followed clinically  I would have a VERY low threshold to reinitiate antibiotics if she worwsened clinically. Due to her PCN allergy would likely use avelox or clindamycin if we went with oral agent  We may wish to re CT her in another 1-2 months    RTC next month.

## 2013-10-02 NOTE — Telephone Encounter (Signed)
Message copied by Carlene Coria on Wed Oct 02, 2013  1:48 PM ------      Message from: Erroll Luna A      Created: Mon Sep 30, 2013  1:50 PM       No explanation for pain.  If she continues  Refer to [facility] for evaluation ------

## 2013-10-09 ENCOUNTER — Ambulatory Visit
Admission: RE | Admit: 2013-10-09 | Discharge: 2013-10-09 | Disposition: A | Discharge status: Home / Self Care | Payer: BC Managed Care – PPO | Source: Ambulatory Visit

## 2013-10-09 DIAGNOSIS — Z1231 Encounter for screening mammogram for malignant neoplasm of breast: Secondary | ICD-10-CM

## 2013-10-10 ENCOUNTER — Encounter: Payer: Self-pay | Admitting: *Deleted

## 2013-10-10 LAB — HM MAMMOGRAPHY: HM MAMMO: NORMAL

## 2013-10-14 ENCOUNTER — Encounter (INDEPENDENT_AMBULATORY_CARE_PROVIDER_SITE_OTHER): Payer: Self-pay | Admitting: Surgery

## 2013-10-14 ENCOUNTER — Ambulatory Visit (INDEPENDENT_AMBULATORY_CARE_PROVIDER_SITE_OTHER): Payer: BC Managed Care – PPO | Admitting: Surgery

## 2013-10-14 VITALS — BP 135/78 | HR 89 | Temp 97.8°F | Ht 61.0 in | Wt 266.0 lb

## 2013-10-14 DIAGNOSIS — K651 Peritoneal abscess: Secondary | ICD-10-CM

## 2013-10-14 HISTORY — DX: Peritoneal abscess: K65.1

## 2013-10-14 NOTE — Patient Instructions (Signed)
Return as needed

## 2013-10-14 NOTE — Progress Notes (Signed)
Patient ID: Leah Peterson, female   DOB: Feb 02, 1956, 58 y.o.   MRN: [id number]  No chief complaint on file.   HPI Leah Peterson is a 58 y.o. female.  Patient is here for hospital followup after being admitted to [place] 9 weeks ago for abdominal pain. She was found to have diverticulitis with microperforation abscess. She also had a large liver mass was biopsied and found to be inflammation. Is felt to be secondary to an abscess. She had a percutaneous drain placed in a perihepatic collection which helped. She was managed in the medical service primarily. She improved on medical management. She's not felt to be a good surgical candidate due to her medical problems. She is here today for followup. She is doing fairly well with minimal abdominal pain. She feels bloated at times. Bowels are moving that she requires MiraLAX. No severe abdominal pain this point. HPI  Past Medical History  Diagnosis Date  . Diverticulosis of colon (without mention of hemorrhage)   . Pure hypercholesterolemia   . Other abnormal glucose   . Essential hypertension, benign   . IBS (irritable bowel syndrome)   . Obesity, unspecified   . Unspecified vitamin D deficiency   . Left knee pain 2012    eval by ortho - bone spurs and DJD, treated with steroid shot which helped temporarily  . Liver abscess 2015    hospitalization for severe septic shock after diverticulitis  . History of diverticulitis   . DVT (deep venous thrombosis) 2015    bilateral posterior tibial veins treated with IVC filter    Past Surgical History  Procedure Laterality Date  . Cesarean section      due to BP elevation  . Colonoscopy  2000    Diverticuli-Dr. Sharlett Iles    Family History  Problem Relation Age of Onset  . Hypertension Father   . Cancer Father     prostate  . Heart disease Father     CABG x 2  . Cancer Mother     Lung-quit smoking 20 years prior to Dx  . Hypertension Mother   . Diabetes      + family hx  .  Drug abuse Brother   . Stroke Maternal Grandmother   . Sleep apnea Brother     with [employer] estranged    Social History History  Substance Use Topics  . Smoking status: Former Research scientist (life sciences)  . Smokeless tobacco: Never Used  . Alcohol Use: Yes     Comment: wine on occasion     Allergies  Allergen Reactions  . Lisinopril     REACTION: lethargy, cough.  . Penicillins     REACTION: LYMPH NODES SWELL    Current Outpatient Prescriptions  Medication Sig Dispense Refill  . amLODipine (NORVASC) 5 MG tablet Take 5 mg by mouth daily.      . cetirizine (ZYRTEC) 10 MG tablet Take 10 mg by mouth daily as needed.        . cholecalciferol (VITAMIN D) 1000 UNITS tablet Take 1,000 Units by mouth daily.       . clotrimazole (LOTRIMIN) 1 % cream Apply 1 application topically 2 (two) times daily.  120 g  0  . dicyclomine (BENTYL) 10 MG capsule Take 1 capsule (10 mg total) by mouth 4 (four) times daily as needed.  30 capsule  1  . ferrous sulfate 325 (65 FE) MG tablet Take 325 mg by mouth daily with breakfast.      . metoprolol  tartrate (LOPRESSOR) 25 MG tablet Take 1 tablet (25 mg total) by mouth 2 (two) times daily.  60 tablet  1  . Multiple Vitamin (MULTIVITAMIN WITH MINERALS) TABS tablet Take 1 tablet by mouth daily.       No current facility-administered medications for this visit.    Review of Systems Review of Systems  Respiratory: Positive for shortness of breath.   Cardiovascular: Positive for leg swelling.  Gastrointestinal: Negative.     Blood pressure 135/78, pulse 89, temperature 97.8 F (36.6 C), height 5\' 1"  (1.549 m), weight 266 lb (120.657 kg).  Physical Exam Physical Exam  Constitutional: She is oriented to person, place, and time. No distress.  Eyes: Pupils are equal, round, and reactive to light. No scleral icterus.  Abdominal: She exhibits distension.    Musculoskeletal: Normal range of motion.  Neurological: She is alert and oriented to person, place, and time.  Skin:  Skin is warm and dry.  Psychiatric: She has a normal mood and affect. Her behavior is normal. Judgment and thought content normal.    Data Reviewed CLINICAL DATA: Diverticulitis. Abscess. Recent drainage removal.  EXAM:  CT ABDOMEN AND PELVIS WITH CONTRAST  TECHNIQUE:  Multidetector CT imaging of the abdomen and pelvis was performed  using the standard protocol following bolus administration of  intravenous contrast.  CONTRAST: 169mL OMNIPAQUE IOHEXOL 300 MG/ML SOLN  COMPARISON: CT 08/15/2013.  FINDINGS:  Previously identified large subcapsular fluid collection has  diminished significantly in size and now measures 4.5 x 2.1 cm. No  focal hepatic abnormality noted on today's exam. Previously  identified hepatic fluid collection has resolved. Previously  identified peritoneal fluid collections have resolved. Spleen is  normal. Splenosis. Pancreas normal. No biliary distention.  Gallbladder nondistended.  Adrenals normal. Kidneys normal. No hydronephrosis or obstructing  ureteral stone. Bladder is nondistended. Uterus and adnexa  unremarkable. Phleboliths. No pathologic pelvic fluid collections.  No significant adenopathy. Abdominal aorta normal in caliber. Distal  vessels are patent. Inferior vena caval filter noted with tip below  the renal veins.  Appendix not visualized. No inflammatory changes noted in the right  or left lower quadrant. Diffuse colonic diverticulosis noted. No  evidence of diverticulitis. No bowel distention. Stomach is  nondistended. No free air. No mesenteric mass. Tiny midline upper  abdominal hernia with herniation of fat noted. No bowel herniation.  Mild cardiomegaly. Lung bases are clear. Degenerative changes  thoracolumbar spine. No acute osseous abnormality.  IMPRESSION:  Significant near complete resolution of previously identified intra  abdominal fluid collections. Only a tiny hepatic subcapsular fluid  collection remains. There is no evidence of  hepatic lesion or free  intraperitoneal fluid on today's examination.  Electronically Signed  By: Marcello Moores Register  On: 09/30/2013 10:27   Assessment    History of diverticulitis with microperforation and pelvic abscess resolved  Liver mass biopsy proven to be inflammation probably secondary to abscess  Liver hematoma resolving  Multiple medical problems    Plan    The patient is clinically improved. She would not tolerate laparotomy for any these issues. Recommend continue medical management. Followup CT in 6 weeks as long as medical condition permits. Followup in 6 weeks. Call with worsening abdominal pain, nausea or vomiting. See immediate medical attention if abdominal pain becomes severe in the emergency room. She is doing well and requires no further follow up.        Lemuel Boodram A. 10/14/2013, 12:34 PM

## 2013-10-16 ENCOUNTER — Encounter: Payer: Self-pay | Admitting: Family Medicine

## 2013-10-16 ENCOUNTER — Ambulatory Visit (INDEPENDENT_AMBULATORY_CARE_PROVIDER_SITE_OTHER): Payer: BC Managed Care – PPO | Admitting: Family Medicine

## 2013-10-16 ENCOUNTER — Encounter: Payer: Self-pay | Admitting: *Deleted

## 2013-10-16 VITALS — BP 140/96 | HR 68 | Temp 98.2°F | Wt 268.2 lb

## 2013-10-16 DIAGNOSIS — K651 Peritoneal abscess: Secondary | ICD-10-CM

## 2013-10-16 DIAGNOSIS — K750 Abscess of liver: Secondary | ICD-10-CM

## 2013-10-16 DIAGNOSIS — R5383 Other fatigue: Secondary | ICD-10-CM

## 2013-10-16 DIAGNOSIS — I10 Essential (primary) hypertension: Secondary | ICD-10-CM

## 2013-10-16 DIAGNOSIS — D62 Acute posthemorrhagic anemia: Secondary | ICD-10-CM

## 2013-10-16 DIAGNOSIS — I82409 Acute embolism and thrombosis of unspecified deep veins of unspecified lower extremity: Secondary | ICD-10-CM

## 2013-10-16 DIAGNOSIS — R5381 Other malaise: Secondary | ICD-10-CM

## 2013-10-16 DIAGNOSIS — R531 Weakness: Secondary | ICD-10-CM

## 2013-10-16 DIAGNOSIS — Z86718 Personal history of other venous thrombosis and embolism: Secondary | ICD-10-CM | POA: Insufficient documentation

## 2013-10-16 DIAGNOSIS — I82403 Acute embolism and thrombosis of unspecified deep veins of lower extremity, bilateral: Secondary | ICD-10-CM

## 2013-10-16 LAB — CBC WITH DIFFERENTIAL/PLATELET
Basophils Absolute: 0 10*3/uL (ref 0.0–0.1)
Basophils Relative: 0.8 % (ref 0.0–3.0)
Eosinophils Absolute: 0.1 10*3/uL (ref 0.0–0.7)
Eosinophils Relative: 1.5 % (ref 0.0–5.0)
HCT: 33.8 % — ABNORMAL LOW (ref 36.0–46.0)
HEMOGLOBIN: 11 g/dL — AB (ref 12.0–15.0)
LYMPHS ABS: 1.9 10*3/uL (ref 0.7–4.0)
Lymphocytes Relative: 41.6 % (ref 12.0–46.0)
MCHC: 32.6 g/dL (ref 30.0–36.0)
MCV: 84.3 fl (ref 78.0–100.0)
Monocytes Absolute: 0.4 10*3/uL (ref 0.1–1.0)
Monocytes Relative: 8.9 % (ref 3.0–12.0)
Neutro Abs: 2.2 10*3/uL (ref 1.4–7.7)
Neutrophils Relative %: 47.2 % (ref 43.0–77.0)
PLATELETS: 240 10*3/uL (ref 150.0–400.0)
RBC: 4.01 Mil/uL (ref 3.87–5.11)
RDW: 16.7 % — ABNORMAL HIGH (ref 11.5–15.5)
WBC: 4.6 10*3/uL (ref 4.0–10.5)

## 2013-10-16 LAB — COMPREHENSIVE METABOLIC PANEL
ALT: 25 U/L (ref 0–35)
AST: 22 U/L (ref 0–37)
Albumin: 3.9 g/dL (ref 3.5–5.2)
Alkaline Phosphatase: 49 U/L (ref 39–117)
BILIRUBIN TOTAL: 0.2 mg/dL (ref 0.2–1.2)
BUN: 13 mg/dL (ref 6–23)
CO2: 27 meq/L (ref 19–32)
CREATININE: 1 mg/dL (ref 0.4–1.2)
Calcium: 9.4 mg/dL (ref 8.4–10.5)
Chloride: 105 mEq/L (ref 96–112)
GFR: 77.42 mL/min (ref >60.00)
Glucose, Bld: 126 mg/dL — ABNORMAL HIGH (ref 70–99)
Potassium: 3.7 mEq/L (ref 3.5–5.1)
Sodium: 139 mEq/L (ref 135–145)
TOTAL PROTEIN: 8 g/dL (ref 6.0–8.3)

## 2013-10-16 LAB — HM DIABETES EYE EXAM

## 2013-10-16 MED ORDER — METOPROLOL TARTRATE 25 MG PO TABS: 25.0000 mg | ORAL_TABLET | Freq: Two times a day (BID) | ORAL | Status: DC

## 2013-10-16 MED ORDER — DICYCLOMINE HCL 10 MG PO CAPS: 10.0000 mg | ORAL_CAPSULE | Freq: Four times a day (QID) | ORAL | Status: DC | PRN

## 2013-10-16 MED ORDER — NYSTATIN 100000 UNIT/GM EX CREA: 1.0000 "application " | TOPICAL_CREAM | Freq: Two times a day (BID) | CUTANEOUS | Status: DC

## 2013-10-16 MED ORDER — AMLODIPINE BESYLATE 5 MG PO TABS: 5.0000 mg | ORAL_TABLET | Freq: Every day | ORAL | Status: DC

## 2013-10-16 NOTE — Progress Notes (Signed)
BP 140/96  Pulse 68  Temp(Src) 98.2 F (36.8 C) (Oral)  Wt 268 lb 4 oz (121.677 kg)   CC: 1 mo f/u  Subjective:    Patient ID: Leah Peterson, female    DOB: 12/15/1955, 58 y.o.   MRN: [id number]  HPI: GRATIA DISLA is a 58 y.o. female presenting on 10/16/2013 for Follow-up   See prior note for details.  PICC line removed 09/07/2013 and stopped IV abx then too. All drains are out now.  IVC filter in place - pending start anticoagulation for bilateral posterior tibial DVTs.  Last saw ID early this month, f/u planned next month. Has been released from surgery as of last visit 10/14/2013.  No chest pain, dyspnea. Strength is slowly returning Asks about return to work and DVT plan.  CT ABDOMEN AND PELVIS WITH CONTRAST  TECHNIQUE:  Multidetector CT imaging of the abdomen and pelvis was performed  using the standard protocol following bolus administration of  intravenous contrast.  CONTRAST: 176mL OMNIPAQUE IOHEXOL 300 MG/ML SOLN  COMPARISON: CT 08/15/2013.  FINDINGS:  Previously identified large subcapsular fluid collection has  diminished significantly in size and now measures 4.5 x 2.1 cm. No  focal hepatic abnormality noted on today's exam. Previously  identified hepatic fluid collection has resolved. Previously  identified peritoneal fluid collections have resolved. Spleen is  normal. Splenosis. Pancreas normal. No biliary distention.  Gallbladder nondistended.  Adrenals normal. Kidneys normal. No hydronephrosis or obstructing  ureteral stone. Bladder is nondistended. Uterus and adnexa  unremarkable. Phleboliths. No pathologic pelvic fluid collections.  No significant adenopathy. Abdominal aorta normal in caliber. Distal  vessels are patent. Inferior vena caval filter noted with tip below  the renal veins.  Appendix not visualized. No inflammatory changes noted in the right  or left lower quadrant. Diffuse colonic diverticulosis noted. No  evidence of  diverticulitis. No bowel distention. Stomach is  nondistended. No free air. No mesenteric mass. Tiny midline upper  abdominal hernia with herniation of fat noted. No bowel herniation.  Mild cardiomegaly. Lung bases are clear. Degenerative changes  thoracolumbar spine. No acute osseous abnormality.  IMPRESSION:  Significant near complete resolution of previously identified intra  abdominal fluid collections. Only a tiny hepatic subcapsular fluid  collection remains. There is no evidence of hepatic lesion or free  intraperitoneal fluid on today's examination.  Electronically Signed  By: Marcello Moores Register  On: 09/30/2013 10:27  Bilateral venous dopplers Summary: - Findings consistent with deep vein thrombosis involving the right posterial tibial vein. - Findings consistent with deep vein thrombosis involving the left posterial tibial vein. Other specific details can be found in the table(s) above. Prepared and Electronically Authenticated by Tinnie Gens 2013-08-08   Relevant past medical, surgical, family and social history reviewed and updated as indicated.  Allergies and medications reviewed and updated. Current Outpatient Prescriptions on File Prior to Visit  Medication Sig  . cetirizine (ZYRTEC) 10 MG tablet Take 10 mg by mouth daily.   . cholecalciferol (VITAMIN D) 1000 UNITS tablet Take 1,000 Units by mouth daily.   . clotrimazole (LOTRIMIN) 1 % cream Apply 1 application topically 2 (two) times daily.  . ferrous sulfate 325 (65 FE) MG tablet Take 325 mg by mouth daily with breakfast.  . Multiple Vitamin (MULTIVITAMIN WITH MINERALS) TABS tablet Take 1 tablet by mouth daily.   No current facility-administered medications on file prior to visit.    Review of Systems Per HPI unless specifically indicated above    Objective:  BP 140/96  Pulse 68  Temp(Src) 98.2 F (36.8 C) (Oral)  Wt 268 lb 4 oz (121.677 kg)  Physical Exam  Nursing note and vitals  reviewed. Constitutional: She appears well-developed and well-nourished. No distress.  Morbidly obese  HENT:  Mouth/Throat: Oropharynx is clear and moist. No oropharyngeal exudate.  Cardiovascular: Normal rate, regular rhythm, normal heart sounds and intact distal pulses.   No murmur heard. Pulmonary/Chest: Effort normal and breath sounds normal. No respiratory distress. She has no wheezes. She has no rales.  Abdominal: Soft. Bowel sounds are normal. She exhibits no distension and no mass. There is no hepatosplenomegaly. There is no tenderness. There is no rigidity, no rebound, no guarding, no CVA tenderness and negative Murphy's sign.  Musculoskeletal: She exhibits no edema.   Results for orders placed in visit on 10/10/13  HM MAMMOGRAPHY      Result Value Ref Range   HM Mammogram Normal Birads 1 Repeat 1 year        Assessment & Plan:   Problem List Items Addressed This Visit   Liver abscess   HYPERTENSION, BENIGN ESSENTIAL     meds refilled.    Relevant Medications      amLODIpine (NORVASC) tablet      metoprolol tartrate (LOPRESSOR) tablet   General weakness     Slowly improving. Discussed return to work 1/2 days 6/22 then full time week after. Await US findings first.    DVT of lower extremity, bilateral - Primary     Check BLE [place] then decide on AC.    Relevant Medications      amLODIpine (NORVASC) tablet      metoprolol tartrate (LOPRESSOR) tablet   Other Relevant Orders      Lower Extremity Venous Duplex Bilateral   Acute blood loss anemia     Recheck CBC today. Continue iron daily.    Relevant Orders      CBC with Differential   Abscess of abdominal cavity     Resolving abdominal and liver abscesses.  Appreciate ID and surgery care of patient. Has been released from surgery care and has f/u with ID next month.    Relevant Orders      Comprehensive metabolic panel       Follow up plan: Return in about 9 weeks (around 12/18/2013), or as needed, for follow  up visit.

## 2013-10-16 NOTE — Patient Instructions (Signed)
Blood work today. Then pass by Marion's office to schedule leg ultrasounds to monitor blood clots. That will help [place] decide if we need anticoagulation or not.

## 2013-10-16 NOTE — Progress Notes (Signed)
Pre visit review using our clinic review tool, if applicable. No additional management support is needed unless otherwise documented below in the visit note. 

## 2013-10-16 NOTE — Assessment & Plan Note (Signed)
Slowly improving. Discussed return to work 1/2 days 6/22 then full time week after. Await US findings first.

## 2013-10-16 NOTE — Assessment & Plan Note (Signed)
meds refilled 

## 2013-10-16 NOTE — Assessment & Plan Note (Signed)
Check BLE [place] then decide on AC.

## 2013-10-16 NOTE — Assessment & Plan Note (Addendum)
Resolving abdominal and liver abscesses.  Appreciate ID and surgery care of patient. Has been released from surgery care and has f/u with ID next month.

## 2013-10-16 NOTE — Assessment & Plan Note (Signed)
Recheck CBC today. Continue iron daily.

## 2013-10-17 ENCOUNTER — Encounter (INDEPENDENT_AMBULATORY_CARE_PROVIDER_SITE_OTHER): Payer: BC Managed Care – PPO | Admitting: Surgery

## 2013-10-17 ENCOUNTER — Encounter (INDEPENDENT_AMBULATORY_CARE_PROVIDER_SITE_OTHER): Payer: BC Managed Care – PPO

## 2013-10-17 DIAGNOSIS — I82403 Acute embolism and thrombosis of unspecified deep veins of lower extremity, bilateral: Secondary | ICD-10-CM

## 2013-10-17 DIAGNOSIS — I87009 Postthrombotic syndrome without complications of unspecified extremity: Secondary | ICD-10-CM

## 2013-10-24 ENCOUNTER — Telehealth: Payer: Self-pay | Admitting: Family Medicine

## 2013-10-24 DIAGNOSIS — I82403 Acute embolism and thrombosis of unspecified deep veins of lower extremity, bilateral: Secondary | ICD-10-CM

## 2013-10-24 NOTE — Telephone Encounter (Signed)
plz notify us showing no blood clot.  No need for anticoagulation.  Will schedule IVC filter removal with IR at [place]. Order placed. Rosaria Ferries can we schedule this? Also, if pt feeling ready, may return to work on Monday full time.  Letter written and in chart.

## 2013-10-24 NOTE — Telephone Encounter (Signed)
Spoke with patient. She was actually inquiring about [place] results. Dr. Damita Dunnings had told her no DVT and she was asking if there was anything else you wanted her to do because he said we would be back in touch with your plan and she hadn't heard anything yet. She also needs a note to return to work full-time.

## 2013-10-24 NOTE — Telephone Encounter (Signed)
Pt never heard back from you on her lab tests results. Also she needs to know, as well as her work, what date she can return to work. Can she start back by July 1st. Pt will need a letter. Please call pt. Thank you

## 2013-10-25 NOTE — Telephone Encounter (Signed)
Patient notified and feels ready to return to work. Letter printed and placed up front for pick up. She will await call from [facility] for scheduling.

## 2013-10-29 ENCOUNTER — Other Ambulatory Visit: Payer: Self-pay | Admitting: Family Medicine

## 2013-10-29 DIAGNOSIS — I82409 Acute embolism and thrombosis of unspecified deep veins of unspecified lower extremity: Secondary | ICD-10-CM

## 2013-10-29 NOTE — Telephone Encounter (Signed)
Patient has been scheduled for July 10th by IV Dept.

## 2013-11-05 ENCOUNTER — Other Ambulatory Visit: Payer: Self-pay | Admitting: Radiology

## 2013-11-05 ENCOUNTER — Encounter (HOSPITAL_COMMUNITY): Payer: Self-pay | Admitting: Pharmacy Technician

## 2013-11-06 ENCOUNTER — Encounter: Payer: Self-pay | Admitting: Infectious Disease

## 2013-11-06 ENCOUNTER — Ambulatory Visit (INDEPENDENT_AMBULATORY_CARE_PROVIDER_SITE_OTHER): Payer: BC Managed Care – PPO | Admitting: Infectious Disease

## 2013-11-06 VITALS — BP 137/79 | HR 73 | Temp 98.2°F | Ht 62.0 in | Wt 273.0 lb

## 2013-11-06 DIAGNOSIS — K5720 Diverticulitis of large intestine with perforation and abscess without bleeding: Secondary | ICD-10-CM

## 2013-11-06 DIAGNOSIS — K750 Abscess of liver: Secondary | ICD-10-CM

## 2013-11-06 DIAGNOSIS — K5732 Diverticulitis of large intestine without perforation or abscess without bleeding: Secondary | ICD-10-CM

## 2013-11-06 NOTE — Progress Notes (Signed)
Subjective:    Patient ID: Leah Peterson, female    DOB: 08/31/1955, 58 y.o.   MRN: [id number]  HPI   58 y.o. female with a PMHx HTN, diverticulitis (last treatment one year ago), IBS, and obesity admitted [facility] 07-31-13 with abdominal pain and diarrhea noted to be hypotensive (80's/60's) and elevated lactic acid 3.27. Abd CT showed diverticulitis and free fluid collection with largest one 9.3 cm at left lobe of liver. Went for [place] bx of liver mass 4/6 which aspirated pus. She had a drain placed at that time. She returned and was hypotensive with fevers/chills. She was started on vanco, cipro and flagyl.  Her BCx from admission grew CNS in one Cx, Diphtheroids in one Cx.  The hepatic abscess Cx grew micro-aerophillic strep in one Cx and polymicrobial in second Cx. She was transitioned to [facility] (stop date 09-07-13) and was d/c home on 4-12. Her liver drain was removed on 08-15-13.   She was seen in followup by my partner Dr. Johnnye Sima on  08/27/13 and IV abx and PICC were pulled on 09/07/13.   She has had Ct scan done on 09/30/13 which shows:  Previously identified large subcapsular fluid collection has  diminished significantly in size and now measures 4.5 x 2.1 cm. No  focal hepatic abnormality noted on today's exam. Previously  identified hepatic fluid collection has resolved. Previously  identified peritoneal fluid collections have resolved  She is without fevers, chills or malaise now off abxx for 2 months +.   Review of Systems  Constitutional: Negative for fever, chills, diaphoresis, activity change, appetite change, fatigue and unexpected weight change.  HENT: Negative for congestion.   Eyes: Negative for photophobia and visual disturbance.  Respiratory: Negative for cough, shortness of breath, wheezing and stridor.   Cardiovascular: Negative for leg swelling.  Gastrointestinal: Positive for abdominal distention. Negative for nausea, vomiting, abdominal pain, diarrhea, constipation,  blood in stool and anal bleeding.  Genitourinary: Negative for dysuria, hematuria, flank pain and difficulty urinating.  Musculoskeletal: Negative for arthralgias, back pain, gait problem, joint swelling and myalgias.  Skin: Negative for color change, pallor, rash and wound.  Neurological: Negative for dizziness, tremors, weakness and light-headedness.  Hematological: Negative for adenopathy. Does not bruise/bleed easily.  Psychiatric/Behavioral: Negative for behavioral problems, confusion, sleep disturbance, dysphoric mood, decreased concentration and agitation.       Objective:   Physical Exam  Constitutional: She is oriented to person, place, and time. She appears well-developed and well-nourished. No distress.  HENT:  Head: Normocephalic and atraumatic.  Mouth/Throat: Oropharynx is clear and moist. No oropharyngeal exudate.  Eyes: Conjunctivae and EOM are normal. No scleral icterus.  Neck: Normal range of motion. Neck supple. No JVD present.  Cardiovascular: Normal rate, regular rhythm and normal heart sounds.   Pulmonary/Chest: Effort normal. No respiratory distress. She has no wheezes.  Abdominal: Soft. Bowel sounds are normal. She exhibits no distension and no mass. There is no tenderness. There is no guarding.  Musculoskeletal: She exhibits edema. She exhibits no tenderness.  Lymphadenopathy:    She has no cervical adenopathy.  Neurological: She is alert and oriented to person, place, and time. She exhibits normal muscle tone. Coordination normal.  Skin: Skin is warm and dry. She is not diaphoretic. No erythema. No pallor.  Psychiatric: She has a normal mood and affect. Her behavior is normal. Judgment and thought content normal.          Assessment & Plan:   Diverticulitis and perforation then liver  abscess with microaerophillic streptococci isolated on culture   --she received IV antibiotics including Invanz thru 09/07/13 essentially a month post drainage.   She has had  no worrisome ssx in past 2+ months off abx and fluid in liver is dramatically decreased  We are planning CT scan in the next week to reassure ourselves that everything will to resolve  RTC PRN

## 2013-11-06 NOTE — Patient Instructions (Signed)
We will check a CT scan this Friday if possible  If evertyhing looks fine, then we will just have you followup as needed!  Have a great summer!

## 2013-11-07 ENCOUNTER — Telehealth: Payer: Self-pay | Admitting: *Deleted

## 2013-11-07 ENCOUNTER — Other Ambulatory Visit: Payer: Self-pay | Admitting: Radiology

## 2013-11-07 NOTE — Telephone Encounter (Signed)
PA received for CT Abdomen Pelvis with contrast.  Reference #[id number]. Scheduled at [facility] for Wednesday 7/15 at 3:00.  Pt should have no solid food for 4 hours before the procedure.  Clear liquids are ok, patient instructed to avoid anything red.  Pt is to pick up the oral contrast before the procedure. Pt accepted appointment. Landis Gandy, RN

## 2013-11-08 ENCOUNTER — Telehealth: Payer: Self-pay | Admitting: Family Medicine

## 2013-11-08 ENCOUNTER — Encounter (HOSPITAL_COMMUNITY): Payer: Self-pay

## 2013-11-08 ENCOUNTER — Ambulatory Visit (HOSPITAL_COMMUNITY)
Admission: RE | Admit: 2013-11-08 | Discharge: 2013-11-08 | Disposition: A | Discharge status: Home / Self Care | Payer: BC Managed Care – PPO | Source: Ambulatory Visit | Attending: Family Medicine | Admitting: Family Medicine

## 2013-11-08 DIAGNOSIS — I82409 Acute embolism and thrombosis of unspecified deep veins of unspecified lower extremity: Secondary | ICD-10-CM

## 2013-11-08 DIAGNOSIS — I10 Essential (primary) hypertension: Secondary | ICD-10-CM | POA: Insufficient documentation

## 2013-11-08 DIAGNOSIS — Z86718 Personal history of other venous thrombosis and embolism: Secondary | ICD-10-CM | POA: Insufficient documentation

## 2013-11-08 DIAGNOSIS — Z4689 Encounter for fitting and adjustment of other specified devices: Secondary | ICD-10-CM | POA: Insufficient documentation

## 2013-11-08 DIAGNOSIS — Z87891 Personal history of nicotine dependence: Secondary | ICD-10-CM | POA: Insufficient documentation

## 2013-11-08 LAB — BASIC METABOLIC PANEL
Anion gap: 14 (ref 5–15)
BUN: 16 mg/dL (ref 6–23)
CHLORIDE: 103 meq/L (ref 96–112)
CO2: 24 meq/L (ref 19–32)
Calcium: 9.4 mg/dL (ref 8.4–10.5)
Creatinine, Ser: 0.9 mg/dL (ref 0.50–1.10)
GFR calc Af Amer: 82 mL/min — ABNORMAL LOW (ref >90)
GFR calc non Af Amer: 71 mL/min — ABNORMAL LOW (ref >90)
GLUCOSE: 93 mg/dL (ref 70–99)
Potassium: 4 mEq/L (ref 3.7–5.3)
Sodium: 141 mEq/L (ref 137–147)

## 2013-11-08 LAB — CBC WITH DIFFERENTIAL/PLATELET
Basophils Absolute: 0 10*3/uL (ref 0.0–0.1)
Basophils Relative: 1 % (ref 0–1)
EOS PCT: 1 % (ref 0–5)
Eosinophils Absolute: 0 10*3/uL (ref 0.0–0.7)
HEMATOCRIT: 35 % — AB (ref 36.0–46.0)
Hemoglobin: 11.2 g/dL — ABNORMAL LOW (ref 12.0–15.0)
LYMPHS PCT: 55 % — AB (ref 12–46)
Lymphs Abs: 2.1 10*3/uL (ref 0.7–4.0)
MCH: 26.9 pg (ref 26.0–34.0)
MCHC: 32 g/dL (ref 30.0–36.0)
MCV: 84.1 fL (ref 78.0–100.0)
MONO ABS: 0.3 10*3/uL (ref 0.1–1.0)
Monocytes Relative: 8 % (ref 3–12)
Neutro Abs: 1.3 10*3/uL — ABNORMAL LOW (ref 1.7–7.7)
Neutrophils Relative %: 35 % — ABNORMAL LOW (ref 43–77)
Platelets: 208 10*3/uL (ref 150–400)
RBC: 4.16 MIL/uL (ref 3.87–5.11)
RDW: 14.4 % (ref 11.5–15.5)
WBC: 3.8 10*3/uL — ABNORMAL LOW (ref 4.0–10.5)

## 2013-11-08 LAB — PROTIME-INR
INR: 1.05 (ref 0.00–1.49)
Prothrombin Time: 13.7 seconds (ref 11.6–15.2)

## 2013-11-08 LAB — APTT: APTT: 36 s (ref 24–37)

## 2013-11-08 MED ORDER — IOHEXOL 300 MG/ML  SOLN
100.0000 mL | Freq: Once | INTRAMUSCULAR | Status: AC | PRN
  Administered 2013-11-08: 1 mL via INTRAVENOUS

## 2013-11-08 MED ORDER — FENTANYL CITRATE 0.05 MG/ML IJ SOLN
INTRAMUSCULAR | Status: AC
  Filled 2013-11-08: qty 4

## 2013-11-08 MED ORDER — FENTANYL CITRATE 0.05 MG/ML IJ SOLN
INTRAMUSCULAR | Status: AC | PRN
  Administered 2013-11-08 (×2): 50 ug via INTRAVENOUS

## 2013-11-08 MED ORDER — SODIUM CHLORIDE 0.9 % IV SOLN
INTRAVENOUS | Status: DC
  Administered 2013-11-08: 10:00:00 via INTRAVENOUS

## 2013-11-08 MED ORDER — MIDAZOLAM HCL 2 MG/2ML IJ SOLN
INTRAMUSCULAR | Status: AC
  Filled 2013-11-08: qty 4

## 2013-11-08 MED ORDER — MIDAZOLAM HCL 2 MG/2ML IJ SOLN
INTRAMUSCULAR | Status: AC | PRN
  Administered 2013-11-08: 2 mg via INTRAVENOUS

## 2013-11-08 NOTE — Discharge Instructions (Signed)
Wound Care  HOME CARE   Only take medicine as told by your doctor.   Remove bandages (dressings) in 24 hours or as told by your doctor.  Put medicated cream and a bandage on the wound as told by your doctor.  Change the bandage if it gets wet, dirty, or starts to smell.  May shower in 24 hours. Do not take baths, swim, or do anything that puts your wound under water until it is healed.  Keep all doctor visits as told. GET HELP RIGHT AWAY IF:   Yellowish-white fluid (pus) comes from the wound.  Medicine does not lessen your pain.  There is a red streak going away from the wound.  You have a fever. MAKE SURE YOU:   Understand these instructions.  Will watch your condition.  Will get help right away if you are not doing well or get worse. Document Released: 01/26/2008 Document Revised: 07/11/2011 Document Reviewed: 08/22/2010 [facility] Patient Information 2015 [place], [place]. This information is not intended to replace advice given to you by your health care provider. Make sure you discuss any questions you have with your health care provider.

## 2013-11-08 NOTE — H&P (Signed)
Leah Peterson is an 58 y.o. female.   Chief Complaint: Inferior vena cava filter was placed 08/08/13 Pt had had recent liver biopsy 4/6( granulomatous tissue)- with post procedure hemoperitoneum.  Liver abscess drain placement same day. Developed B DVT and was not candidate for anticoagulation secondary to the bleed. Filter was placed and has done well since then. Followed by Dr Danise Mina. Doppler study B lower extremities 6/19 shows NO DVT  Now scheduled for IVC filter removal  HPI: HLD; diverticulosis; HTN; IBS; liver abscess  Past Medical History  Diagnosis Date  . Diverticulosis of colon (without mention of hemorrhage)   . Pure hypercholesterolemia   . Other abnormal glucose   . Essential hypertension, benign   . IBS (irritable bowel syndrome)   . Obesity, unspecified   . Unspecified vitamin D deficiency   . Left knee pain 2012    eval by ortho - bone spurs and DJD, treated with steroid shot which helped temporarily  . Liver abscess 2015    hospitalization for severe septic shock after diverticulitis  . History of diverticulitis   . DVT (deep venous thrombosis) 2015    bilateral posterior tibial veins treated with IVC filter    Past Surgical History  Procedure Laterality Date  . Cesarean section      due to BP elevation  . Colonoscopy  2000    Diverticuli-Dr. Sharlett Iles    Family History  Problem Relation Age of Onset  . Hypertension Father   . Cancer Father     prostate  . Heart disease Father     CABG x 2  . Cancer Mother     Lung-quit smoking 20 years prior to Dx  . Hypertension Mother   . Diabetes      + family hx  . Drug abuse Brother   . Stroke Maternal Grandmother   . Sleep apnea Brother     with [facility] estranged   Social History:  reports that she has quit smoking. She has never used smokeless tobacco. She reports that she drinks alcohol. She reports that she does not use illicit drugs.  Allergies:  Allergies  Allergen Reactions  . Lisinopril     REACTION: lethargy, cough.  . Penicillins     REACTION: LYMPH NODES SWELL     (Not in a hospital admission)  No results found for this or any previous visit (from the past 48 hour(s)). No results found.  Review of Systems  Constitutional: Negative for fever and weight loss.  Respiratory: Negative for shortness of breath.   Cardiovascular: Negative for chest pain.  Gastrointestinal: Negative for nausea, vomiting and abdominal pain.  Musculoskeletal: Negative for back pain.  Neurological: Negative for weakness and headaches.  Psychiatric/Behavioral: Negative for substance abuse.    Blood pressure 146/89, pulse 67, temperature 98.1 F (36.7 C), temperature source Oral, resp. rate 18, height 5\' 2"  (1.575 m), weight 120.657 kg (266 lb), SpO2 99.00%. Physical Exam  Constitutional: She is oriented to person, place, and time. She appears well-nourished.  Cardiovascular: Normal rate and regular rhythm.   No murmur heard. Respiratory: Effort normal and breath sounds normal. She has no wheezes.  GI: Soft. Bowel sounds are normal. There is no tenderness.  Musculoskeletal: Normal range of motion.  Neurological: She is alert and oriented to person, place, and time.  Skin: Skin is warm and dry.  Psychiatric: She has a normal mood and affect. Her behavior is normal. Judgment and thought content normal.     Assessment/Plan B  DVT dx 08/08/13 Not candidate for anticoag secondary recent hemoperitoneum after liver bx (4/6) IVC filter placed 4/9 Now NO DVT B per doppler 10/18/13 Now scheduled for IVC filter removal Pt aware of procedure benefits and risks and agreeable to proceed Consent signed and in chart  Onur Mori A 11/08/2013, 10:49 AM

## 2013-11-08 NOTE — Procedures (Signed)
Successful fluoroscopic guided retrieval of IVC filter. Access: R IJ vein. No immediate post procedural complications.

## 2013-11-08 NOTE — Telephone Encounter (Signed)
Diabetic Bundle.  Pt needs f/u appt for BP, A1C, and LDL.  Left vm to return call.

## 2013-11-13 ENCOUNTER — Ambulatory Visit
Admission: RE | Admit: 2013-11-13 | Discharge: 2013-11-13 | Disposition: A | Discharge status: Home / Self Care | Payer: BC Managed Care – PPO | Source: Ambulatory Visit | Attending: Infectious Disease | Admitting: Infectious Disease

## 2013-11-13 DIAGNOSIS — K750 Abscess of liver: Secondary | ICD-10-CM

## 2013-11-13 MED ORDER — IOHEXOL 300 MG/ML  SOLN
100.0000 mL | Freq: Once | INTRAMUSCULAR | Status: AC | PRN
  Administered 2013-11-13: 100 mL via INTRAVENOUS

## 2013-11-19 ENCOUNTER — Telehealth: Payer: Self-pay | Admitting: *Deleted

## 2013-11-19 NOTE — Progress Notes (Signed)
Left message

## 2013-11-19 NOTE — Telephone Encounter (Signed)
Left message on patient's voicemail with the results. Landis Gandy, RN

## 2013-11-19 NOTE — Telephone Encounter (Signed)
Message copied by Landis Gandy on Tue Nov 19, 2013  9:56 AM ------      Message from: Tommy Medal, CORNELIUS N      Created: Tue Nov 19, 2013  9:43 AM       Can we let her know her CT scan is reasurring. The area is not completely gone that we were worried about but it is smaller ------

## 2013-11-19 NOTE — Progress Notes (Signed)
A user error has taken place: encounter opened in error, closed for administrative reasons.

## 2014-02-14 ENCOUNTER — Other Ambulatory Visit: Payer: Self-pay

## 2014-03-26 ENCOUNTER — Telehealth: Payer: Self-pay | Admitting: Family Medicine

## 2014-03-26 NOTE — Telephone Encounter (Signed)
Flu vaccine and eye exam updated. I cannot access Mychart for patient. Hopefully by updating her chart, the emails will stop.

## 2014-03-26 NOTE — Telephone Encounter (Signed)
Pt dropped off her flu shot documentation she received at her work. She also would like it documented in her mychart so that she does not receive any more notifications about getting her flu shot.  Pt would like Dr. Satira Sark from [facility] added as her eye doctor as well as stop getting reminders to get her eye exam as she has already had one this year.   Flu doc in [facility]

## 2014-09-22 ENCOUNTER — Other Ambulatory Visit: Payer: Self-pay

## 2014-09-22 DIAGNOSIS — Z1231 Encounter for screening mammogram for malignant neoplasm of breast: Secondary | ICD-10-CM

## 2014-10-01 ENCOUNTER — Other Ambulatory Visit: Payer: Self-pay | Admitting: Family Medicine

## 2014-10-01 ENCOUNTER — Other Ambulatory Visit (INDEPENDENT_AMBULATORY_CARE_PROVIDER_SITE_OTHER): Payer: BC Managed Care – PPO

## 2014-10-01 DIAGNOSIS — D62 Acute posthemorrhagic anemia: Secondary | ICD-10-CM

## 2014-10-01 DIAGNOSIS — E785 Hyperlipidemia, unspecified: Secondary | ICD-10-CM

## 2014-10-01 DIAGNOSIS — E43 Unspecified severe protein-calorie malnutrition: Secondary | ICD-10-CM

## 2014-10-01 DIAGNOSIS — I10 Essential (primary) hypertension: Secondary | ICD-10-CM

## 2014-10-01 DIAGNOSIS — E119 Type 2 diabetes mellitus without complications: Secondary | ICD-10-CM

## 2014-10-03 ENCOUNTER — Other Ambulatory Visit (INDEPENDENT_AMBULATORY_CARE_PROVIDER_SITE_OTHER): Payer: BC Managed Care – PPO

## 2014-10-03 DIAGNOSIS — D62 Acute posthemorrhagic anemia: Secondary | ICD-10-CM

## 2014-10-03 DIAGNOSIS — E43 Unspecified severe protein-calorie malnutrition: Secondary | ICD-10-CM | POA: Diagnosis not present

## 2014-10-03 DIAGNOSIS — E785 Hyperlipidemia, unspecified: Secondary | ICD-10-CM | POA: Diagnosis not present

## 2014-10-03 DIAGNOSIS — E119 Type 2 diabetes mellitus without complications: Secondary | ICD-10-CM | POA: Diagnosis not present

## 2014-10-03 DIAGNOSIS — I10 Essential (primary) hypertension: Secondary | ICD-10-CM | POA: Diagnosis not present

## 2014-10-03 LAB — CBC WITH DIFFERENTIAL/PLATELET
Basophils Absolute: 0 10*3/uL (ref 0.0–0.1)
Basophils Relative: 0.6 % (ref 0.0–3.0)
EOS ABS: 0 10*3/uL (ref 0.0–0.7)
Eosinophils Relative: 1.1 % (ref 0.0–5.0)
HCT: 38.7 % (ref 36.0–46.0)
Hemoglobin: 12.8 g/dL (ref 12.0–15.0)
LYMPHS ABS: 2 10*3/uL (ref 0.7–4.0)
Lymphocytes Relative: 50 % — ABNORMAL HIGH (ref 12.0–46.0)
MCHC: 33.1 g/dL (ref 30.0–36.0)
MCV: 83.8 fl (ref 78.0–100.0)
MONOS PCT: 6.5 % (ref 3.0–12.0)
Monocytes Absolute: 0.3 10*3/uL (ref 0.1–1.0)
NEUTROS ABS: 1.7 10*3/uL (ref 1.4–7.7)
Neutrophils Relative %: 41.8 % — ABNORMAL LOW (ref 43.0–77.0)
Platelets: 196 10*3/uL (ref 150.0–400.0)
RBC: 4.62 Mil/uL (ref 3.87–5.11)
RDW: 14.1 % (ref 11.5–15.5)
WBC: 4.1 10*3/uL (ref 4.0–10.5)

## 2014-10-03 LAB — LIPID PANEL
CHOL/HDL RATIO: 4
CHOLESTEROL: 168 mg/dL (ref 0–200)
HDL: 44.9 mg/dL (ref >39.00)
LDL Cholesterol: 114 mg/dL — ABNORMAL HIGH (ref 0–99)
NonHDL: 123.1
Triglycerides: 46 mg/dL (ref 0.0–149.0)
VLDL: 9.2 mg/dL (ref 0.0–40.0)

## 2014-10-03 LAB — RENAL FUNCTION PANEL
ALBUMIN: 3.6 g/dL (ref 3.5–5.2)
BUN: 15 mg/dL (ref 6–23)
CHLORIDE: 104 meq/L (ref 96–112)
CO2: 29 meq/L (ref 19–32)
CREATININE: 1.1 mg/dL (ref 0.40–1.20)
Calcium: 8.9 mg/dL (ref 8.4–10.5)
GFR: 65.93 mL/min (ref >60.00)
Glucose, Bld: 91 mg/dL (ref 70–99)
PHOSPHORUS: 3.3 mg/dL (ref 2.3–4.6)
Potassium: 4.1 mEq/L (ref 3.5–5.1)
SODIUM: 137 meq/L (ref 135–145)

## 2014-10-03 LAB — MICROALBUMIN / CREATININE URINE RATIO
CREATININE, U: 120.4 mg/dL
Microalb Creat Ratio: 0.6 mg/g (ref 0.0–30.0)
Microalb, Ur: 0.7 mg/dL (ref 0.0–1.9)

## 2014-10-03 LAB — HEMOGLOBIN A1C: Hgb A1c MFr Bld: 5.8 % (ref 4.6–6.5)

## 2014-10-08 ENCOUNTER — Ambulatory Visit (INDEPENDENT_AMBULATORY_CARE_PROVIDER_SITE_OTHER): Payer: BC Managed Care – PPO | Admitting: Family Medicine

## 2014-10-08 ENCOUNTER — Encounter: Payer: Self-pay | Admitting: Family Medicine

## 2014-10-08 ENCOUNTER — Other Ambulatory Visit (HOSPITAL_COMMUNITY)
Admission: RE | Admit: 2014-10-08 | Discharge: 2014-10-08 | Disposition: A | Discharge status: Home / Self Care | Payer: BC Managed Care – PPO | Source: Ambulatory Visit | Attending: Family Medicine | Admitting: Family Medicine

## 2014-10-08 VITALS — BP 148/92 | HR 64 | Temp 98.0°F | Ht 62.0 in | Wt 291.0 lb

## 2014-10-08 DIAGNOSIS — Z1211 Encounter for screening for malignant neoplasm of colon: Secondary | ICD-10-CM

## 2014-10-08 DIAGNOSIS — N760 Acute vaginitis: Secondary | ICD-10-CM | POA: Diagnosis present

## 2014-10-08 DIAGNOSIS — I10 Essential (primary) hypertension: Secondary | ICD-10-CM

## 2014-10-08 DIAGNOSIS — Z124 Encounter for screening for malignant neoplasm of cervix: Secondary | ICD-10-CM | POA: Diagnosis not present

## 2014-10-08 DIAGNOSIS — Z8719 Personal history of other diseases of the digestive system: Secondary | ICD-10-CM

## 2014-10-08 DIAGNOSIS — Z0000 Encounter for general adult medical examination without abnormal findings: Secondary | ICD-10-CM | POA: Diagnosis not present

## 2014-10-08 DIAGNOSIS — R7303 Prediabetes: Secondary | ICD-10-CM

## 2014-10-08 DIAGNOSIS — Z01419 Encounter for gynecological examination (general) (routine) without abnormal findings: Secondary | ICD-10-CM | POA: Insufficient documentation

## 2014-10-08 DIAGNOSIS — I82403 Acute embolism and thrombosis of unspecified deep veins of lower extremity, bilateral: Secondary | ICD-10-CM

## 2014-10-08 DIAGNOSIS — K5730 Diverticulosis of large intestine without perforation or abscess without bleeding: Secondary | ICD-10-CM

## 2014-10-08 DIAGNOSIS — E785 Hyperlipidemia, unspecified: Secondary | ICD-10-CM

## 2014-10-08 MED ORDER — AMLODIPINE BESYLATE 5 MG PO TABS: 5.0000 mg | ORAL_TABLET | Freq: Every day | ORAL | Status: DC

## 2014-10-08 NOTE — Assessment & Plan Note (Signed)
Resolved off AC. IVF filter removed last year.

## 2014-10-08 NOTE — Assessment & Plan Note (Signed)
Reviewed #s with patient. 

## 2014-10-08 NOTE — Addendum Note (Signed)
Addended by: Ria Bush on: 10/08/2014 12:28 PM   Modules accepted: Miquel Dunn

## 2014-10-08 NOTE — Assessment & Plan Note (Addendum)
rec schedule colonoscopy.

## 2014-10-08 NOTE — Assessment & Plan Note (Signed)
Chronic, uncontrolled as pt stopped meds last year.  Will rec restart amlodipine 5mg  daily. Sent to pharmacy.

## 2014-10-08 NOTE — Assessment & Plan Note (Addendum)
Discussed healthy diet and lifestyle changes to affect sustainable weight loss  

## 2014-10-08 NOTE — Patient Instructions (Addendum)
Expect a call to schedule colonoscopy in next few weeks.  Incorporate walking into routine. Goal 139min/wk of moderate intensity aerobic exercise. Decrease added sugars and sweetened beverages. Return as needed or in 1 year for next physical. Good to see you today, call us with questions.  Health Maintenance Adopting a healthy lifestyle and getting preventive care can go a long way to promote health and wellness. Talk with your health care provider about what schedule of regular examinations is right for you. This is a good chance for you to check in with your provider about disease prevention and staying healthy. In between checkups, there are plenty of things you can do on your own. Experts have done a lot of research about which lifestyle changes and preventive measures are most likely to keep you healthy. Ask your health care provider for more information. WEIGHT AND DIET  Eat a healthy diet  Be sure to include plenty of vegetables, fruits, low-fat dairy products, and lean protein.  Do not eat a lot of foods high in solid fats, added sugars, or salt.  Get regular exercise. This is one of the most important things you can do for your health.  Most adults should exercise for at least 150 minutes each week. The exercise should increase your heart rate and make you sweat (moderate-intensity exercise).  Most adults should also do strengthening exercises at least twice a week. This is in addition to the moderate-intensity exercise.  Maintain a healthy weight  Body mass index (BMI) is a measurement that can be used to identify possible weight problems. It estimates body fat based on height and weight. Your health care provider can help determine your BMI and help you achieve or maintain a healthy weight.  For females 43 years of age and older:   A BMI below 18.5 is considered underweight.  A BMI of 18.5 to 24.9 is normal.  A BMI of 25 to 29.9 is considered overweight.  A BMI of 30 and  above is considered obese.  Watch levels of cholesterol and blood lipids  You should start having your blood tested for lipids and cholesterol at 57 years of age, then have this test every 5 years.  You may need to have your cholesterol levels checked more often if:  Your lipid or cholesterol levels are high.  You are older than 57 years of age.  You are at high risk for heart disease.  CANCER SCREENING   Lung Cancer  Lung cancer screening is recommended for adults 31-22 years old who are at high risk for lung cancer because of a history of smoking.  A yearly low-dose CT scan of the lungs is recommended for people who:  Currently smoke.  Have quit within the past 15 years.  Have at least a 30-pack-year history of smoking. A pack year is smoking an average of one pack of cigarettes a day for 1 year.  Yearly screening should continue until it has been 15 years since you quit.  Yearly screening should stop if you develop a health problem that would prevent you from having lung cancer treatment.  Breast Cancer  Practice breast self-awareness. This means understanding how your breasts normally appear and feel.  It also means doing regular breast self-exams. Let your health care provider know about any changes, no matter how small.  If you are in your 20s or 30s, you should have a clinical breast exam (CBE) by a health care provider every 1-3 years as part of  a regular health exam.  If you are 45 or older, have a CBE every year. Also consider having a breast X-ray (mammogram) every year.  If you have a family history of breast cancer, talk to your health care provider about genetic screening.  If you are at high risk for breast cancer, talk to your health care provider about having an MRI and a mammogram every year.  Breast cancer gene (BRCA) assessment is recommended for women who have family members with BRCA-related cancers. BRCA-related cancers  include:  Breast.  Ovarian.  Tubal.  Peritoneal cancers.  Results of the assessment will determine the need for genetic counseling and BRCA1 and BRCA2 testing. Cervical Cancer Routine pelvic examinations to screen for cervical cancer are no longer recommended for nonpregnant women who are considered low risk for cancer of the pelvic organs (ovaries, uterus, and vagina) and who do not have symptoms. A pelvic examination may be necessary if you have symptoms including those associated with pelvic infections. Ask your health care provider if a screening pelvic exam is right for you.   The Pap test is the screening test for cervical cancer for women who are considered at risk.  If you had a hysterectomy for a problem that was not cancer or a condition that could lead to cancer, then you no longer need Pap tests.  If you are older than 65 years, and you have had normal Pap tests for the past 10 years, you no longer need to have Pap tests.  If you have had past treatment for cervical cancer or a condition that could lead to cancer, you need Pap tests and screening for cancer for at least 20 years after your treatment.  If you no longer get a Pap test, assess your risk factors if they change (such as having a new sexual partner). This can affect whether you should start being screened again.  Some women have medical problems that increase their chance of getting cervical cancer. If this is the case for you, your health care provider may recommend more frequent screening and Pap tests.  The human papillomavirus (HPV) test is another test that may be used for cervical cancer screening. The HPV test looks for the virus that can cause cell changes in the cervix. The cells collected during the Pap test can be tested for HPV.  The HPV test can be used to screen women 17 years of age and older. Getting tested for HPV can extend the interval between normal Pap tests from three to five years.  An HPV  test also should be used to screen women of any age who have unclear Pap test results.  After 57 years of age, women should have HPV testing as often as Pap tests.  Colorectal Cancer  This type of cancer can be detected and often prevented.  Routine colorectal cancer screening usually begins at 57 years of age and continues through 57 years of age.  Your health care provider may recommend screening at an earlier age if you have risk factors for colon cancer.  Your health care provider may also recommend using home test kits to check for hidden blood in the stool.  A small camera at the end of a tube can be used to examine your colon directly (sigmoidoscopy or colonoscopy). This is done to check for the earliest forms of colorectal cancer.  Routine screening usually begins at age 13.  Direct examination of the colon should be repeated every 5-10 years through  57 years of age. However, you may need to be screened more often if early forms of precancerous polyps or small growths are found. Skin Cancer  Check your skin from head to toe regularly.  Tell your health care provider about any new moles or changes in moles, especially if there is a change in a mole's shape or color.  Also tell your health care provider if you have a mole that is larger than the size of a pencil eraser.  Always use sunscreen. Apply sunscreen liberally and repeatedly throughout the day.  Protect yourself by wearing long sleeves, pants, a wide-brimmed hat, and sunglasses whenever you are outside. HEART DISEASE, DIABETES, AND HIGH BLOOD PRESSURE   Have your blood pressure checked at least every 1-2 years. High blood pressure causes heart disease and increases the risk of stroke.  If you are between 34 years and 81 years old, ask your health care provider if you should take aspirin to prevent strokes.  Have regular diabetes screenings. This involves taking a blood sample to check your fasting blood sugar  level.  If you are at a normal weight and have a low risk for diabetes, have this test once every three years after 57 years of age.  If you are overweight and have a high risk for diabetes, consider being tested at a younger age or more often. PREVENTING INFECTION  Hepatitis B  If you have a higher risk for hepatitis B, you should be screened for this virus. You are considered at high risk for hepatitis B if:  You were born in a country where hepatitis B is common. Ask your health care provider which countries are considered high risk.  Your parents were born in a high-risk country, and you have not been immunized against hepatitis B (hepatitis B vaccine).  You have HIV or AIDS.  You use needles to inject street drugs.  You live with someone who has hepatitis B.  You have had sex with someone who has hepatitis B.  You get hemodialysis treatment.  You take certain medicines for conditions, including cancer, organ transplantation, and autoimmune conditions. Hepatitis C  Blood testing is recommended for:  Everyone born from [birth year] through 1963.  Anyone with known risk factors for hepatitis C. Sexually transmitted infections (STIs)  You should be screened for sexually transmitted infections (STIs) including gonorrhea and chlamydia if:  You are sexually active and are younger than 57 years of age.  You are older than 57 years of age and your health care provider tells you that you are at risk for this type of infection.  Your sexual activity has changed since you were last screened and you are at an increased risk for chlamydia or gonorrhea. Ask your health care provider if you are at risk.  If you do not have HIV, but are at risk, it may be recommended that you take a prescription medicine daily to prevent HIV infection. This is called pre-exposure prophylaxis (PrEP). You are considered at risk if:  You are sexually active and do not regularly use condoms or know the HIV status  of your partner(s).  You take drugs by injection.  You are sexually active with a partner who has HIV. Talk with your health care provider about whether you are at high risk of being infected with HIV. If you choose to begin PrEP, you should first be tested for HIV. You should then be tested every 3 months for as long as you are taking PrEP.  PREGNANCY   If you are premenopausal and you may become pregnant, ask your health care provider about preconception counseling.  If you may become pregnant, take 400 to 800 micrograms (mcg) of folic acid every day.  If you want to prevent pregnancy, talk to your health care provider about birth control (contraception). OSTEOPOROSIS AND MENOPAUSE   Osteoporosis is a disease in which the bones lose minerals and strength with aging. This can result in serious bone fractures. Your risk for osteoporosis can be identified using a bone density scan.  If you are 17 years of age or older, or if you are at risk for osteoporosis and fractures, ask your health care provider if you should be screened.  Ask your health care provider whether you should take a calcium or vitamin D supplement to lower your risk for osteoporosis.  Menopause may have certain physical symptoms and risks.  Hormone replacement therapy may reduce some of these symptoms and risks. Talk to your health care provider about whether hormone replacement therapy is right for you.  HOME CARE INSTRUCTIONS   Schedule regular health, dental, and eye exams.  Stay current with your immunizations.   Do not use any tobacco products including cigarettes, chewing tobacco, or electronic cigarettes.  If you are pregnant, do not drink alcohol.  If you are breastfeeding, limit how much and how often you drink alcohol.  Limit alcohol intake to no more than 1 drink per day for nonpregnant women. One drink equals 12 ounces of beer, 5 ounces of wine, or 1 ounces of hard liquor.  Do not use street  drugs.  Do not share needles.  Ask your health care provider for help if you need support or information about quitting drugs.  Tell your health care provider if you often feel depressed.  Tell your health care provider if you have ever been abused or do not feel safe at home. Document Released: 11/01/2010 Document Revised: 09/02/2013 Document Reviewed: 03/20/2013 [facility] Patient Information 2015 [place], [place]. This information is not intended to replace advice given to you by your health care provider. Make sure you discuss any questions you have with your health care provider.

## 2014-10-08 NOTE — Assessment & Plan Note (Signed)
Preventative protocols reviewed and updated unless pt declined. Discussed healthy diet and lifestyle.  

## 2014-10-08 NOTE — Assessment & Plan Note (Signed)
Discussed with patient. Actually better controlled compared to last year despite noted weight gain.

## 2014-10-08 NOTE — Addendum Note (Signed)
Addended by: Royann Shivers A on: 10/08/2014 12:30 PM   Modules accepted: Orders

## 2014-10-08 NOTE — Progress Notes (Signed)
Pre visit review using our clinic review tool, if applicable. No additional management support is needed unless otherwise documented below in the visit note. 

## 2014-10-08 NOTE — Progress Notes (Addendum)
BP 148/92 mmHg  Pulse 64  Temp(Src) 98 F (36.7 C) (Oral)  Ht 5\' 2"  (1.575 m)  Wt 291 lb (131.997 kg)  BMI 53.21 kg/m2   CC: CPE  Subjective:    Patient ID: Leah Peterson, female    DOB: November 28, 1955, 59 y.o.   MRN: [id number]  HPI: Leah Peterson is a 60 y.o. female presenting on 10/08/2014 for Annual Exam   Last seen 09/2013 after hospitalization and prolonged IV abx use for diverticulitis with mult abscesses including in liver with septic shock. Illness also complicated by bilateral posterior tibial DVTs. Rpt [place] without evidence of DVT so IVF filter was removed. Here for CPE  Actually stopped all her medications 11/2013 (got tired of taking).   Preventative: Colonoscopy 2000 Dr. Sharlett Iles with diverticulosis. Given bad case of diverticulitis recommend repeat colonoscopy.  Mammogram 09/2013 WNL, scheduled for next week Pap smear - always normal last 2011. No irregular bleeding. LMP 10/2007. No post menopausal sxs.  Tetanus 2010.  Pneumovax 07/2013 Flu shot yearly Seat belt use discussed No suspicious moles on skin.  Caffeine: 2-3 cups coffee, 2 cups sweet tea  Lives with daughter and grand daughter. No pets.  Divorced; 1 daughter Occupation: Administrator at [employer]  Activity: granddaughter. No regular exercise.  Diet: good water, fruits/vegetables daily, drinks a lot of sweet tea  Relevant past medical, surgical, family and social history reviewed and updated as indicated. Interim medical history since our last visit reviewed. Allergies and medications reviewed and updated. Current Outpatient Prescriptions on File Prior to Visit  Medication Sig  . dicyclomine (BENTYL) 10 MG capsule Take 10 mg by mouth 4 (four) times daily as needed for spasms.  Marland Kitchen nystatin cream (MYCOSTATIN) Apply 1 application topically 2 (two) times daily as needed for dry skin.  . cetirizine (ZYRTEC) 10 MG tablet Take 10 mg by mouth at bedtime as needed for allergies.    No current  facility-administered medications on file prior to visit.    Review of Systems  Constitutional: Negative for fever, chills, activity change, appetite change, fatigue and unexpected weight change.  HENT: Negative for hearing loss.   Eyes: Negative for visual disturbance.  Respiratory: Negative for cough, chest tightness, shortness of breath and wheezing.   Cardiovascular: Positive for leg swelling. Negative for chest pain and palpitations.  Gastrointestinal: Negative for nausea, vomiting, abdominal pain, diarrhea, constipation, blood in stool and abdominal distention.  Genitourinary: Negative for hematuria and difficulty urinating.  Musculoskeletal: Negative for myalgias, arthralgias and neck pain.  Skin: Negative for rash.  Neurological: Negative for dizziness, seizures, syncope and headaches.  Hematological: Negative for adenopathy. Does not bruise/bleed easily.  Psychiatric/Behavioral: Negative for dysphoric mood. The patient is not nervous/anxious.    Per HPI unless specifically indicated above     Objective:    BP 148/92 mmHg  Pulse 64  Temp(Src) 98 F (36.7 C) (Oral)  Ht 5\' 2"  (1.575 m)  Wt 291 lb (131.997 kg)  BMI 53.21 kg/m2  Wt Readings from Last 3 Encounters:  10/08/14 291 lb (131.997 kg)  11/08/13 266 lb (120.657 kg)  11/06/13 273 lb (123.832 kg)    Physical Exam  Constitutional: She is oriented to person, place, and time. She appears well-developed and well-nourished. No distress.  Morbidly obese  HENT:  Head: Normocephalic and atraumatic.  Right Ear: Hearing, tympanic membrane, external ear and ear canal normal.  Left Ear: Hearing, tympanic membrane, external ear and ear canal normal.  Nose: Nose normal.  Mouth/Throat: Uvula is  midline, oropharynx is clear and moist and mucous membranes are normal. No oropharyngeal exudate, posterior oropharyngeal edema or posterior oropharyngeal erythema.  Eyes: Conjunctivae and EOM are normal. Pupils are equal, round, and  reactive to light. No scleral icterus.  Neck: Normal range of motion. Neck supple. No thyromegaly present.  Cardiovascular: Normal rate, regular rhythm, normal heart sounds and intact distal pulses.   No murmur heard. Pulses:      Radial pulses are 2+ on the right side, and 2+ on the left side.  Pulmonary/Chest: Effort normal and breath sounds normal. No respiratory distress. She has no wheezes. She has no rales.  Breast exam - declined  Abdominal: Soft. Bowel sounds are normal. She exhibits no distension and no mass. There is no tenderness. There is no rebound and no guarding.  Genitourinary: Uterus normal. Pelvic exam was performed with patient supine. There is no rash, tenderness or lesion on the right labia. There is no rash, tenderness or lesion on the left labia. Cervix exhibits no motion tenderness, no discharge and no friability. Right adnexum displays no mass, no tenderness and no fullness. Left adnexum displays no mass, no tenderness and no fullness. Vaginal discharge (thick white present - wet prep sent) found.  Pap performed on cervix Pt denies sxs.  Musculoskeletal: Normal range of motion. She exhibits edema.  Lymphedema changes to BLE  Lymphadenopathy:    She has no cervical adenopathy.  Neurological: She is alert and oriented to person, place, and time.  CN grossly intact, station and gait intact  Skin: Skin is warm and dry. No rash noted.  Psychiatric: She has a normal mood and affect. Her behavior is normal. Judgment and thought content normal.  Nursing note and vitals reviewed.  Results for orders placed or performed in visit on 10/03/14  Microalbumin / creatinine urine ratio  Result Value Ref Range   Microalb, Ur 0.7 0.0 - 1.9 mg/dL   Creatinine,U 120.4 mg/dL   Microalb Creat Ratio 0.6 0.0 - 30.0 mg/g  Lipid panel  Result Value Ref Range   Cholesterol 168 0 - 200 mg/dL   Triglycerides 46.0 0.0 - 149.0 mg/dL   HDL 44.90 >39.00 mg/dL   VLDL 9.2 0.0 - 40.0 mg/dL    LDL Cholesterol 114 (H) 0 - 99 mg/dL   Total CHOL/HDL Ratio 4    NonHDL 123.10   Renal function panel  Result Value Ref Range   Sodium 137 135 - 145 mEq/L   Potassium 4.1 3.5 - 5.1 mEq/L   Chloride 104 96 - 112 mEq/L   CO2 29 19 - 32 mEq/L   Calcium 8.9 8.4 - 10.5 mg/dL   Albumin 3.6 3.5 - 5.2 g/dL   BUN 15 6 - 23 mg/dL   Creatinine, Ser 1.10 0.40 - 1.20 mg/dL   Glucose, Bld 91 70 - 99 mg/dL   Phosphorus 3.3 2.3 - 4.6 mg/dL   GFR 65.93 >60.00 mL/min  Hemoglobin A1c  Result Value Ref Range   Hgb A1c MFr Bld 5.8 4.6 - 6.5 %  CBC with Differential/Platelet  Result Value Ref Range   WBC 4.1 4.0 - 10.5 K/uL   RBC 4.62 3.87 - 5.11 Mil/uL   Hemoglobin 12.8 12.0 - 15.0 g/dL   HCT 38.7 36.0 - 46.0 %   MCV 83.8 78.0 - 100.0 fl   MCHC 33.1 30.0 - 36.0 g/dL   RDW 14.1 11.5 - 15.5 %   Platelets 196.0 150.0 - 400.0 K/uL   Neutrophils Relative % 41.8 (L)  43.0 - 77.0 %   Lymphocytes Relative 50.0 (H) 12.0 - 46.0 %   Monocytes Relative 6.5 3.0 - 12.0 %   Eosinophils Relative 1.1 0.0 - 5.0 %   Basophils Relative 0.6 0.0 - 3.0 %   Neutro Abs 1.7 1.4 - 7.7 K/uL   Lymphs Abs 2.0 0.7 - 4.0 K/uL   Monocytes Absolute 0.3 0.1 - 1.0 K/uL   Eosinophils Absolute 0.0 0.0 - 0.7 K/uL   Basophils Absolute 0.0 0.0 - 0.1 K/uL      Assessment & Plan:   Problem List Items Addressed This Visit    Diverticulosis of large intestine    rec schedule colonoscopy.      DVT of lower extremity, bilateral    Resolved off AC. IVF filter removed last year.       Relevant Medications   amLODipine (NORVASC) 5 MG tablet   Healthcare maintenance - Primary    Preventative protocols reviewed and updated unless pt declined. Discussed healthy diet and lifestyle.       History of diverticulitis   Relevant Orders   Ambulatory referral to Gastroenterology   HYPERTENSION, BENIGN ESSENTIAL    Chronic, uncontrolled as pt stopped meds last year.  Will rec restart amlodipine 5mg  daily. Sent to pharmacy.       Relevant Medications   amLODipine (NORVASC) 5 MG tablet   Mild hyperlipidemia    Reviewed #s with patient.       Relevant Medications   amLODipine (NORVASC) 5 MG tablet   Morbid obesity    Discussed healthy diet and lifestyle changes to affect sustainable weight loss.      Prediabetes    Discussed with patient. Actually better controlled compared to last year despite noted weight gain.       Other Visit Diagnoses    Special screening for malignant neoplasms, colon        Relevant Orders    Ambulatory referral to Gastroenterology        Follow up plan: Return in about 1 year (around 10/08/2015), or as needed, for annual exam, prior fasting for blood work.

## 2014-10-09 LAB — CYTOLOGY - PAP

## 2014-10-10 LAB — CERVICOVAGINAL ANCILLARY ONLY: CANDIDA VAGINITIS: POSITIVE — AB

## 2014-10-10 IMAGING — XA IR IVC FILTER RETRIEVAL / S&I /IMG GUID/MOD SED
1 series · 10 of 10 positions shown · non-contrast
Comparison: Ultrasound fluoroscopic guided IVC filter placement -
08/08/2013;

INDICATION: History of DVT with contraindication to anticoagulation (hemo
peritoneum following hepatic mass biopsy). Recent lower extremity
DVT ultrasound demonstrated resolution of lower extremity DVT.
Request made for IVC filter retrieval.

EXAM:
1. IR IVC FILTER RETRIEVAL/S+I/ IMAGE GUIDE MODERATE SEDATION
2. IR ULTRASOUND GUIDANCE VASC ACCESS
TECHNIQUE: Informed written consent was obtained from the patient after a
discussion of the risks, benefits and alternatives to treatment.
Questions regarding the procedure were encouraged and answered. A
timeout was performed prior to the initiation of the procedure.

[Series 1: run · 10 of 10 slices shown]
[im 1/10]
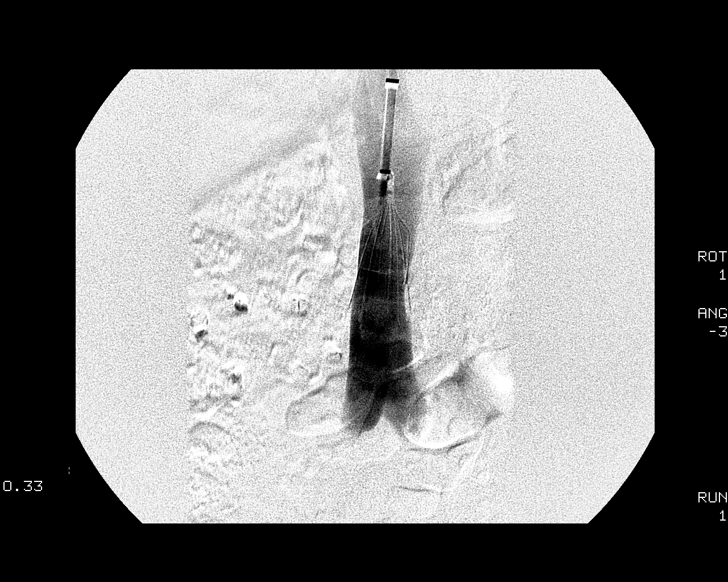
[im 2/10]
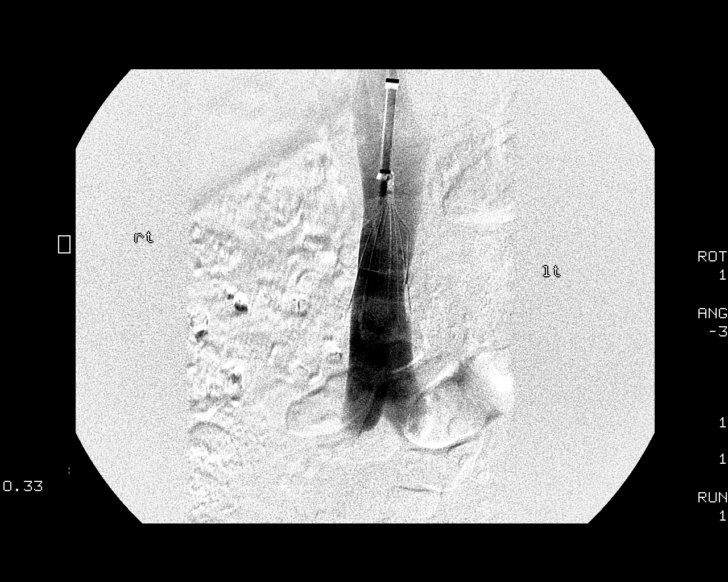
[im 3/10]
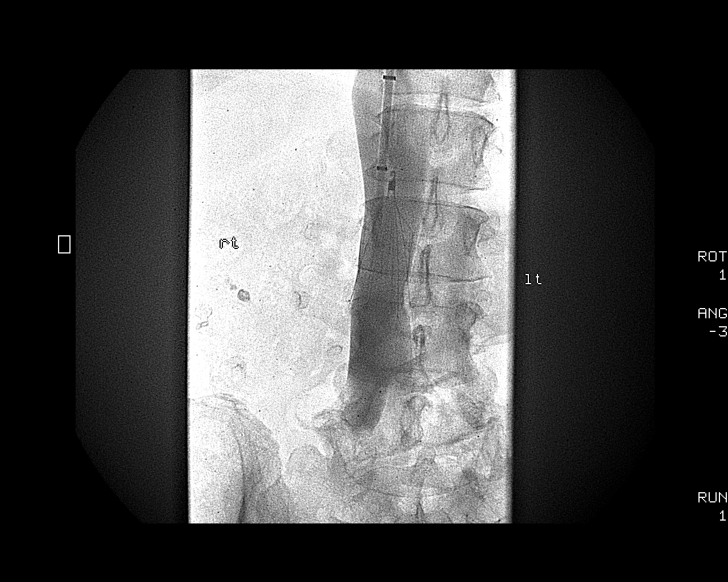
[im 4/10]
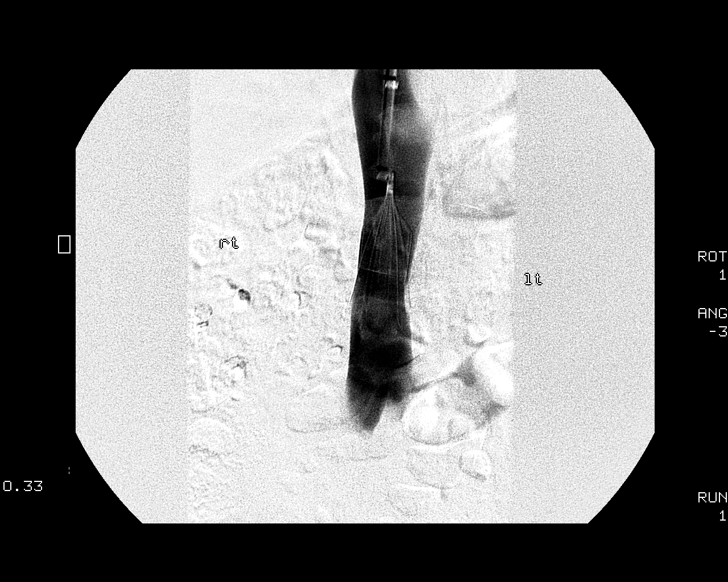
[im 5/10]
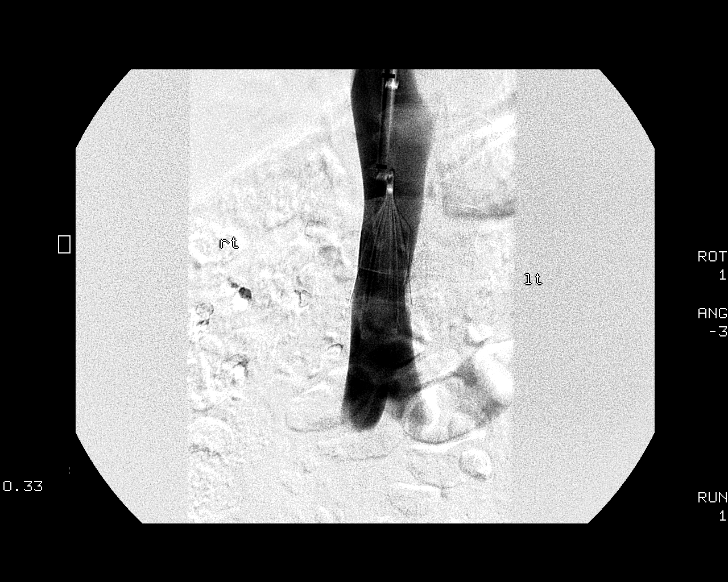
[im 6/10]
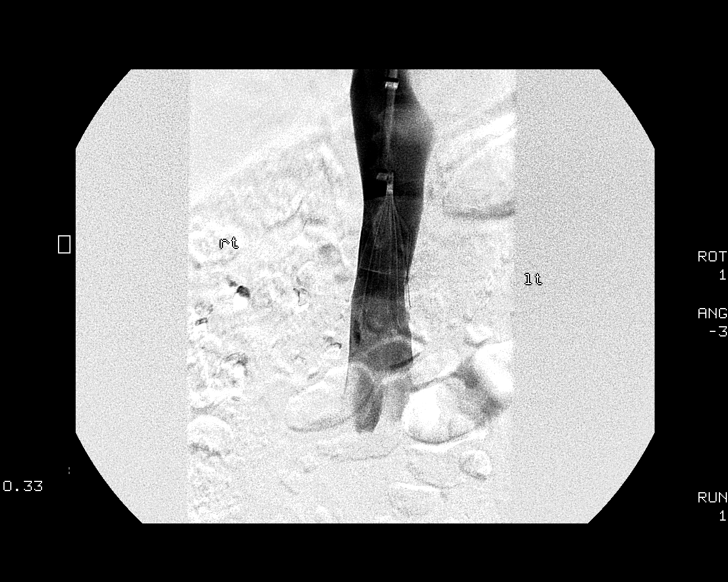
[im 7/10]
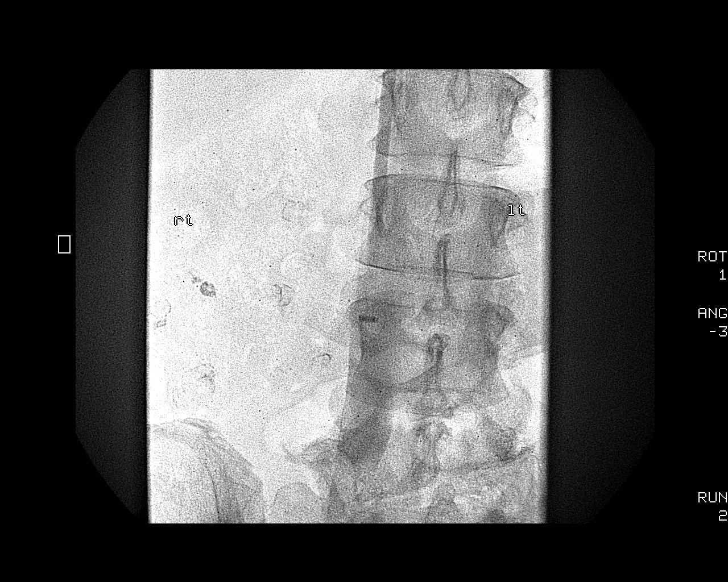
[im 8/10]
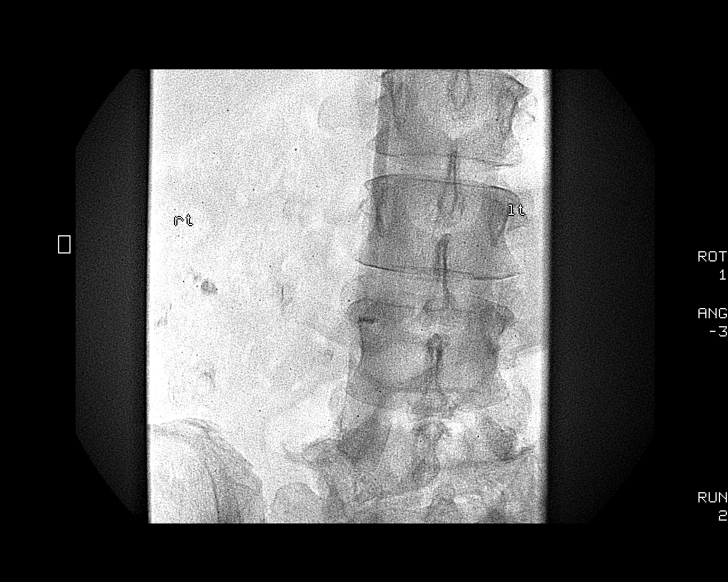
[im 9/10]
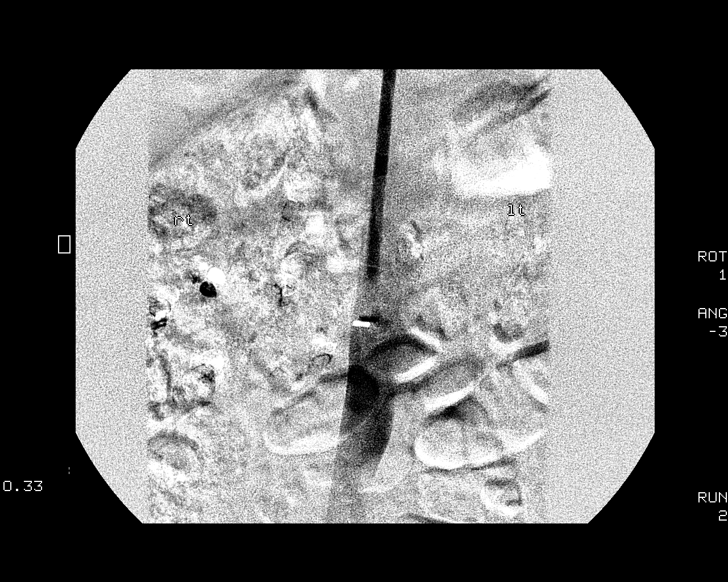
[im 10/10]
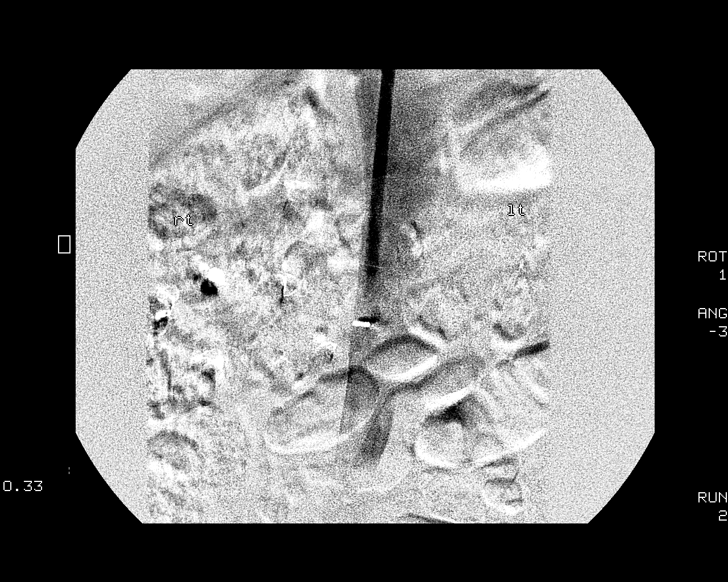

[10 of 10 positions shown; findings below may reference images not displayed]

CT abdomen pelvis - 09/30/2013; 08/15/2013

MEDICATIONS:
Fentanyl 100 mcg IV; Versed 2 mg IV

ANESTHESIA/SEDATION:
Total Moderate Sedation Time

16 minutes

FLUOROSCOPY TIME:  2 minutes, 12 seconds

CONTRAST:  50 cc OMNIPAQUE IOHEXOL 300 MG/ML  SOLN
The right neck was prepped and draped in the usual sterile fashion,
and a sterile drape was applied covering the operative field.
Maximum barrier sterile technique with sterile gowns and gloves were
used for the procedure. A timeout was performed prior to the
initiation of the procedure. Local anesthesia was provided with 1%
lidocaine.

Under direct ultrasound guidance, the right internal jugular vein
was accessed with a micropuncture needle at the overlying soft
tissues were anesthetized with 1% lidocaine. An ultrasound image was
saved for documentation purposes. A Bentson wire was advanced to the
level of the IVC.

Under intermittent fluoroscopic guidance, the track was dilated
ultimately allowing placement of a 11 Giorgi Jumper IVC filter
retrieval sheath. The hook of the filter was successfully snared and
the filter was withdrawn intact into the co-axial 11-French sheath.
A completion inferior venacavagram was performed.

At this point, the procedure was terminated. All wires, catheters
and sheaths were removed from the patient. Hemostasis was achieved
at the right neck access site with manual compression. A dressing
was placed. The patient tolerated the above procedure well without
immediate postprocedural complication.
FINDINGS: Inferior venacavagram demonstrates wide patency of the IVC. There is
no thrombus within the infrarenal IVC filter which appears unchanged
in position from the time of initial placement.

The IVC filter was successfully removed using a snare device as
detailed above.

Completion inferior venacavagram was negative for caval injury.
IMPRESSION: 1.  Successful fluoroscopic guided removal of infrarenal IVC filter.

2.  Normal inferior venacavagram.

## 2014-10-13 ENCOUNTER — Encounter: Payer: Self-pay | Admitting: *Deleted

## 2014-10-15 ENCOUNTER — Ambulatory Visit
Admission: RE | Admit: 2014-10-15 | Discharge: 2014-10-15 | Disposition: A | Discharge status: Home / Self Care | Payer: BC Managed Care – PPO | Source: Ambulatory Visit

## 2014-10-15 DIAGNOSIS — Z1231 Encounter for screening mammogram for malignant neoplasm of breast: Secondary | ICD-10-CM

## 2014-10-15 LAB — HM MAMMOGRAPHY: HM Mammogram: NORMAL

## 2014-10-15 IMAGING — CT CT ABD-PELV W/ CM
3 of 5 series · 13 of 36 positions shown, 19 images · IV contrast ([ID] [id number])
Comparison: September 30, 2013

CLINICAL DATA: Evaluate for liver abscess. Patient had liver abcess
drain removed on August 16, 2013.

EXAM:
CT ABDOMEN AND PELVIS WITH CONTRAST
TECHNIQUE: Multidetector CT imaging of the abdomen and pelvis was performed
using the standard protocol following bolus administration of
intravenous contrast.
CONTRAST:  100mL OMNIPAQUE IOHEXOL 300 MG/ML  SOLN

[Series 3: abd/pelvis with · axial · 0.88mm/px · z∈[-295,+0]mm · 6 of 82 slices shown, 11 images]
[im 12/82  soft-tissue]
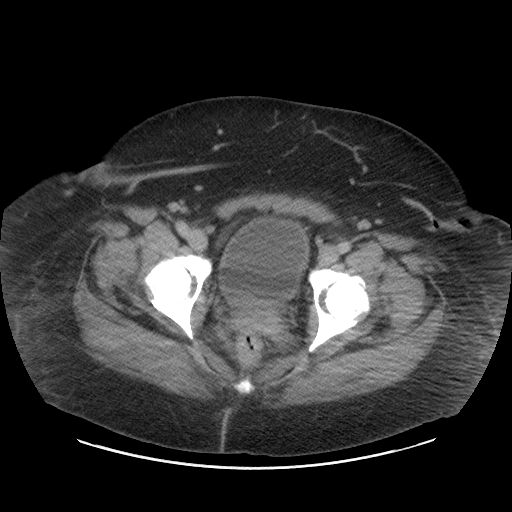
[im 12/82  bone]
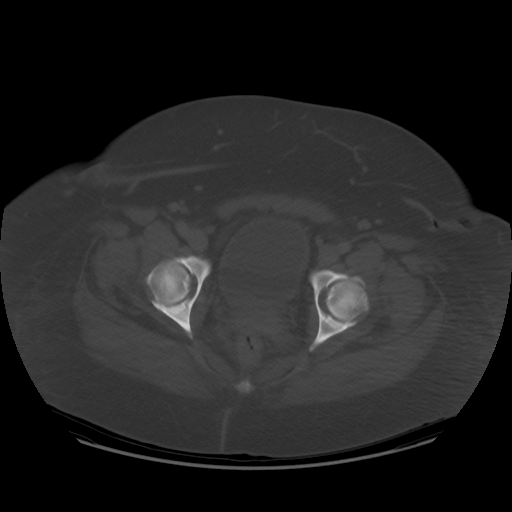
[im 24/82  soft-tissue]
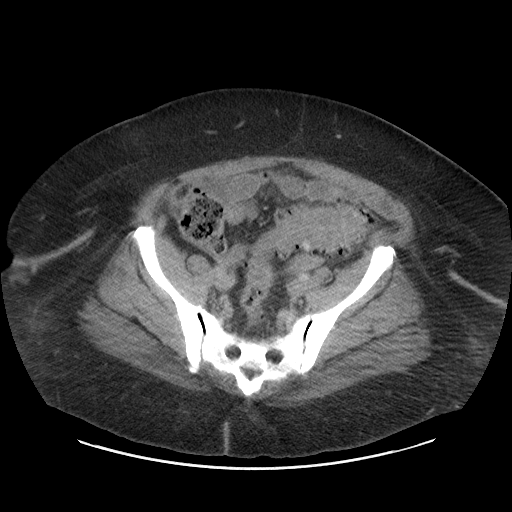
[im 35/82  soft-tissue]
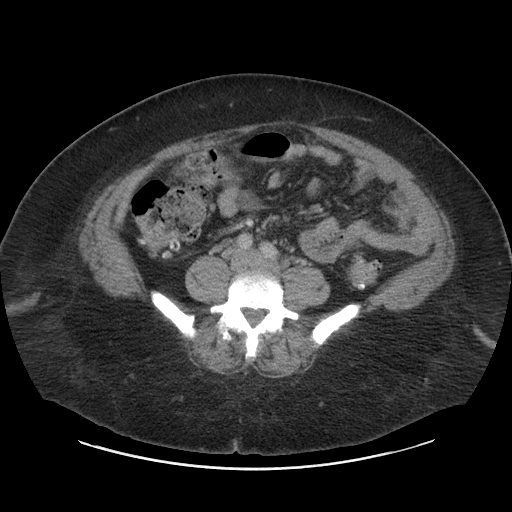
[im 35/82  lung]
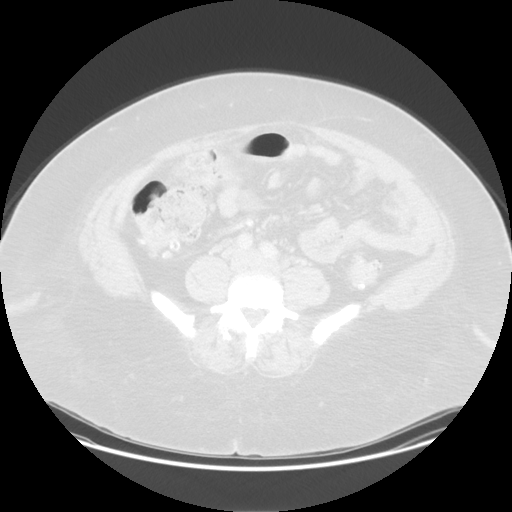
[im 47/82  soft-tissue]
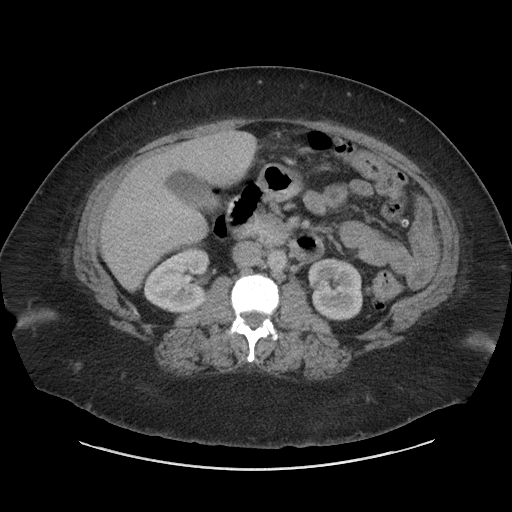
[im 47/82  lung]
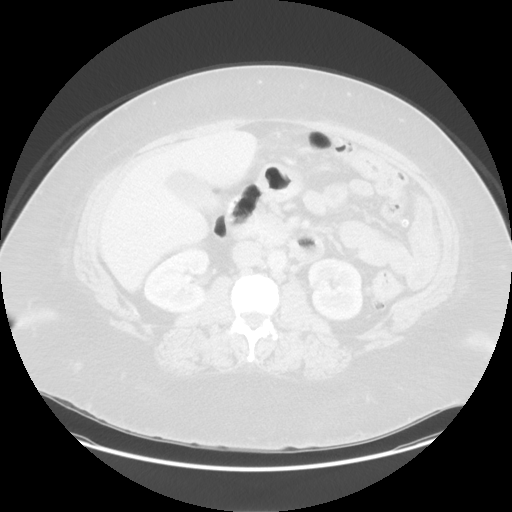
[im 58/82  soft-tissue]
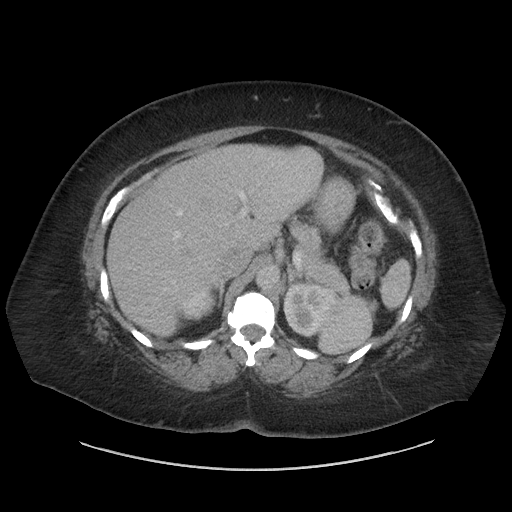
[im 58/82  lung]
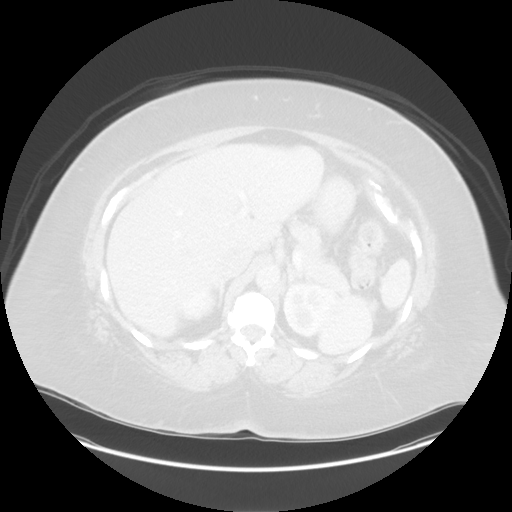
[im 70/82  soft-tissue]
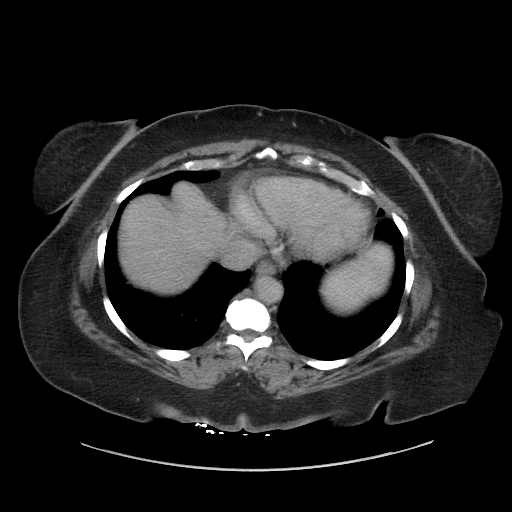
[im 70/82  lung]
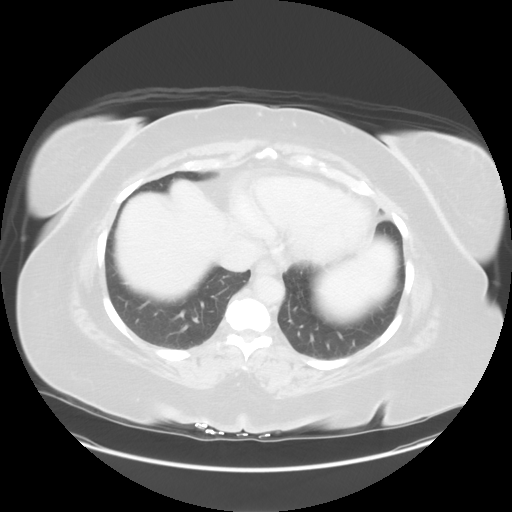

[Series 601: coronal body · coronal · 0.88mm/px · 1 of 141 slices shown, 2 images]
[im 47/141  soft-tissue]
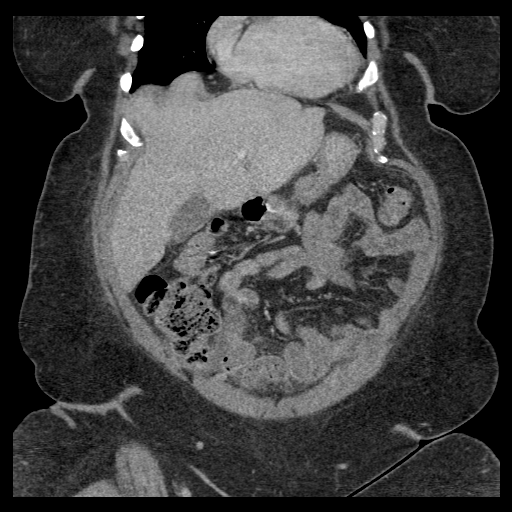
[im 47/141  bone]
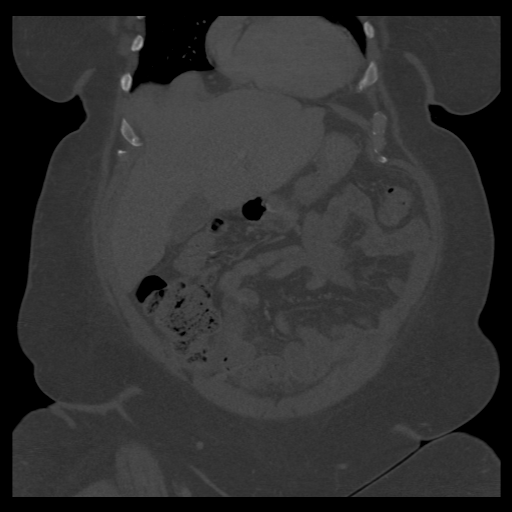

[Series 602: sagittal body · sagittal · 0.88mm/px · 6 of 181 slices shown]
[im 22/181  soft-tissue]
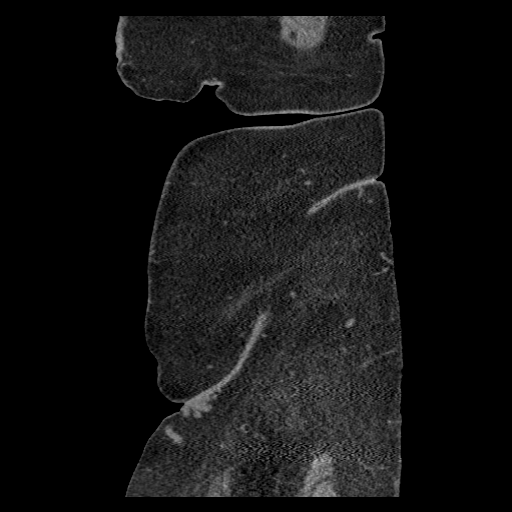
[im 43/181  soft-tissue]
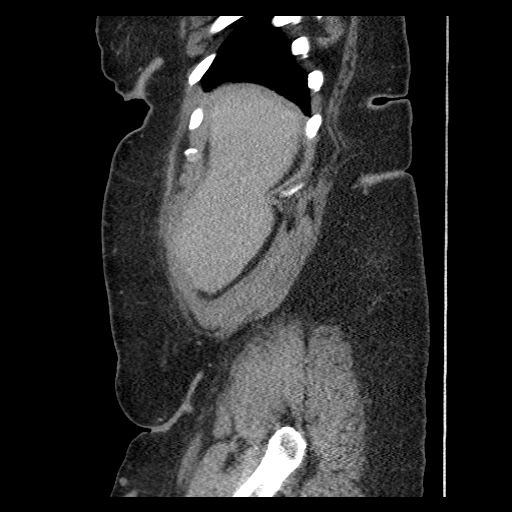
[im 64/181  soft-tissue]
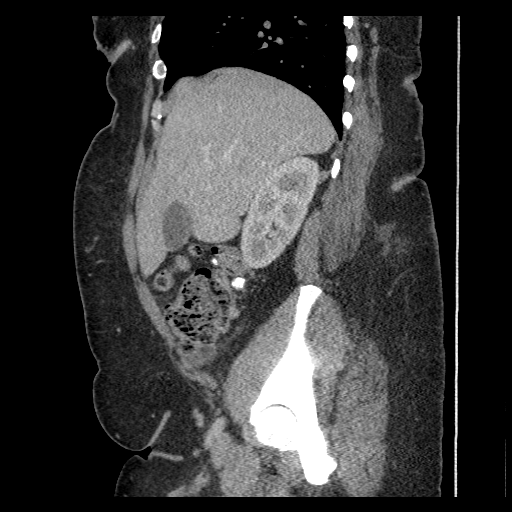
[im 85/181  soft-tissue]
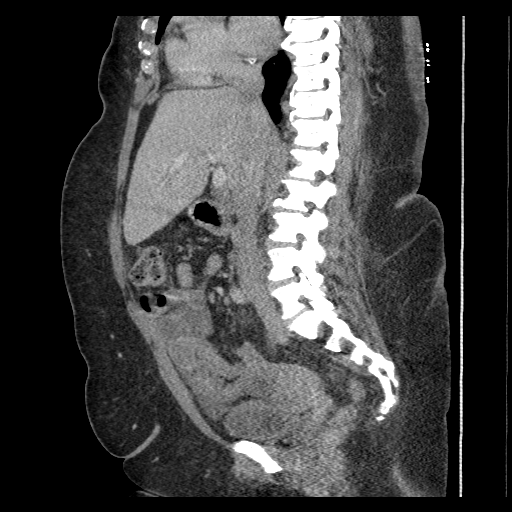
[im 106/181  soft-tissue]
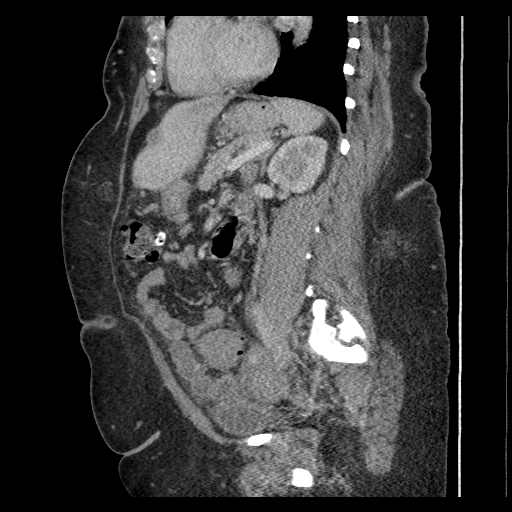
[im 128/181  soft-tissue]
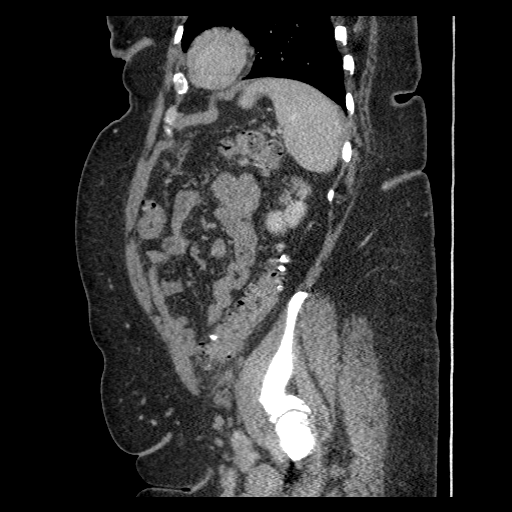

[13 of 36 positions shown; findings below may reference images not displayed]

FINDINGS: The previously noted subcapsular fluid collection has significantly
decreased in size, currently measuring 1.76 x 1.2 cm. The liver is
otherwise normal. No focal intrahepatic lesion is identified. The
spleen, pancreas, gallbladder, adrenal glands and kidneys are
normal. There is no hydronephrosis bilaterally. The aorta is normal
in caliber. There is no small bowel obstruction or diverticulitis.
There is diverticulosis of colon. The appendix is not seen but no
inflammation is noted around cecum.

Partial fluid-filled bladder is normal. There is small amount of
free fluid in the pelvis decrease compared to prior CT. The
visualized lung bases are clear. Degenerative joint changes of the
spine are noted.
IMPRESSION: The previously noted subcapsular fluid collection at the left lobe
liver has decreased in size, currently measuring 1.76 x 1.2 cm. No
focal discrete intrahepatic lesion is identified.

Small amount of free fluid in the pelvis, decreased compared to
prior CT.

## 2014-10-16 ENCOUNTER — Encounter: Payer: Self-pay | Admitting: *Deleted

## 2014-10-21 ENCOUNTER — Encounter: Payer: Self-pay | Admitting: Internal Medicine

## 2014-10-27 ENCOUNTER — Other Ambulatory Visit: Payer: Self-pay

## 2014-11-20 ENCOUNTER — Telehealth: Payer: Self-pay

## 2014-11-20 NOTE — Telephone Encounter (Signed)
Recent BMI high.  Please schedule at hospital and call pt before PV on 7/27.  Thank you, Larina Lieurance/previsit

## 2014-11-21 ENCOUNTER — Other Ambulatory Visit: Payer: Self-pay | Admitting: *Deleted

## 2014-11-21 DIAGNOSIS — Z1211 Encounter for screening for malignant neoplasm of colon: Secondary | ICD-10-CM

## 2014-11-21 NOTE — Telephone Encounter (Signed)
Per Leah Peterson needs to be scheduled with DOD. Scheduled at [facility] endo on 12/23/14 at 10:30 AM with Dr. Carlean Purl for prop colonoscopy. Angela in Pre visit aware.

## 2014-11-26 ENCOUNTER — Ambulatory Visit (AMBULATORY_SURGERY_CENTER): Payer: BC Managed Care – PPO

## 2014-11-26 VITALS — Ht 61.5 in | Wt 287.6 lb

## 2014-11-26 DIAGNOSIS — Z1211 Encounter for screening for malignant neoplasm of colon: Secondary | ICD-10-CM

## 2014-11-26 NOTE — Progress Notes (Signed)
No allergies to eggs or soy No home oxygen No past problems with anesthesia No diet/weight loss meds  Has email.  Emmi instructions given for colonoscopy. 

## 2014-12-17 ENCOUNTER — Encounter: Payer: BC Managed Care – PPO | Admitting: Internal Medicine

## 2014-12-18 ENCOUNTER — Encounter (HOSPITAL_COMMUNITY): Payer: Self-pay | Admitting: *Deleted

## 2014-12-23 ENCOUNTER — Encounter (HOSPITAL_COMMUNITY): Payer: Self-pay | Admitting: Anesthesiology

## 2014-12-23 ENCOUNTER — Ambulatory Visit (HOSPITAL_COMMUNITY)
Admission: RE | Admit: 2014-12-23 | Discharge: 2014-12-23 | Disposition: A | Discharge status: Home / Self Care | Payer: BC Managed Care – PPO | Source: Ambulatory Visit | Attending: Internal Medicine | Admitting: Internal Medicine

## 2014-12-23 ENCOUNTER — Encounter (HOSPITAL_COMMUNITY)
Admission: RE | Disposition: A | Discharge status: Home / Self Care | Payer: Self-pay | Source: Ambulatory Visit | Attending: Internal Medicine

## 2014-12-23 ENCOUNTER — Telehealth: Payer: Self-pay | Admitting: Family Medicine

## 2014-12-23 ENCOUNTER — Encounter (HOSPITAL_COMMUNITY): Payer: Self-pay

## 2014-12-23 DIAGNOSIS — Z5309 Procedure and treatment not carried out because of other contraindication: Secondary | ICD-10-CM | POA: Insufficient documentation

## 2014-12-23 DIAGNOSIS — Z1211 Encounter for screening for malignant neoplasm of colon: Secondary | ICD-10-CM

## 2014-12-23 SURGERY — CANCELLED PROCEDURE

## 2014-12-23 MED ORDER — PROPOFOL 10 MG/ML IV BOLUS
INTRAVENOUS | Status: AC
  Filled 2014-12-23: qty 20

## 2014-12-23 MED ORDER — SODIUM CHLORIDE 0.9 % IV SOLN: INTRAVENOUS | Status: DC

## 2014-12-23 MED ORDER — LIDOCAINE HCL (CARDIAC) 20 MG/ML IV SOLN
INTRAVENOUS | Status: AC
  Filled 2014-12-23: qty 5

## 2014-12-23 NOTE — Anesthesia Preprocedure Evaluation (Deleted)
Anesthesia Evaluation    Airway        Dental   Pulmonary former smoker,          Cardiovascular hypertension, Pt. on medications DVT     Neuro/Psych    GI/Hepatic   Endo/Other  Morbid obesity  Renal/GU      Musculoskeletal   Abdominal   Peds  Hematology   Anesthesia Other Findings   Reproductive/Obstetrics                             Anesthesia Physical Anesthesia Plan Anesthesia Quick Evaluation

## 2014-12-23 NOTE — Progress Notes (Signed)
Pt BP checked.  First reading was 223/115; second reading in opposite arm 218/120.  Dr Marcell Barlow notified of BP and that pt hasn't had BP meds in over a week.  Dr Marcell Barlow cancelled case with recommendation that pt follow up with PCP today or tomorrow or if she was having symptoms then to go to the ER.  Dr Carlean Purl notified of cancelled procedure.  He spoke with pt and she is having no symptoms (headache, blurry vision, etc).  States she will go home and make an appointment with her PCP for either today or tomorrow.  With patients permission her daughter, [name], was notified of reason of cancellation.  Pt dressed and discharged.

## 2014-12-23 NOTE — Telephone Encounter (Signed)
Spoke with patient. She had another appt at 2:00 and there were no other appts available today that she would be able to make because of that appt. Spoke with Dr. Darnell Level who advised it was okay to wait until tomorrow. He had no openings-scheduled with [name]. Patient denies any HA,blurry vision, one sided weakness, numbness/tingling or slurred speech and said that she feels completely fine. Advised that if she had any of these symptoms occur before her appt tomorrow, to go to the ER. She verbalized understanding.

## 2014-12-23 NOTE — H&P (Signed)
  Filed Vitals:   12/23/14 0947  BP: 218/120  Pulse: 69  Temp: 98.1 F (36.7 C)  Resp: 13    Denies HA, visual disturbance.  Case cancelled and advised to see PCP today or go to ED  To restart BP meds - off x 1 week.  "I just didn't take my medicine - I know I should have"  Gatha Mayer, MD, [facility] [phone] (pager) 12/23/2014 10:09 AM

## 2014-12-23 NOTE — Telephone Encounter (Signed)
Pt had to have today's colonoscopy cancelled due to hypertensive urgency with bp 200s/120s.  plz call today and schedule f/u in office today with available provider for eval.

## 2014-12-24 ENCOUNTER — Encounter: Payer: Self-pay | Admitting: Primary Care

## 2014-12-24 ENCOUNTER — Ambulatory Visit (INDEPENDENT_AMBULATORY_CARE_PROVIDER_SITE_OTHER): Payer: BC Managed Care – PPO | Admitting: Primary Care

## 2014-12-24 VITALS — BP 168/102 | HR 73 | Temp 98.2°F | Ht 62.0 in | Wt 280.1 lb

## 2014-12-24 DIAGNOSIS — I10 Essential (primary) hypertension: Secondary | ICD-10-CM | POA: Diagnosis not present

## 2014-12-24 MED ORDER — AMLODIPINE BESYLATE 10 MG PO TABS: 10.0000 mg | ORAL_TABLET | Freq: Every day | ORAL | Status: DC

## 2014-12-24 NOTE — Assessment & Plan Note (Signed)
Managed on Amlodipine 5 mg, has not had medication in over 1 week, including visit to GI on 8/23. Readings at GI 200's/120's. Denies headaches, dizziness, chest pain, SOB. BP improved today, still asymptomatic. Will increase amlodipine to 10 mg today with close follow up in 2 weeks for re-evaluation. She is to check pressures at home, record, and bring them to next visit.

## 2014-12-24 NOTE — Progress Notes (Signed)
Subjective:    Patient ID: Leah Peterson, female    DOB: 1956-03-07, 59 y.o.   MRN: [id number]  HPI  Leah Peterson is a 59 year old female who presents today with a chief complaint of hypertension. She was scheduled to have a colonoscopy on 8/23 which was cancelled due to hypertensive urgency with blood pressure readings of 200's/120. She is currently under a lot of stress as she is renovating her deceased father's house. She is managed on Amlodipine 5 mg and has been on for several years. She did not take her BP medication all last week and this week, including yesterday's visit to GI. She did take her amlodipine this morning. Denies headaches, dizziness, blurred vision, chest pain.  BP Readings from Last 3 Encounters:  12/23/14 218/120  12/24/14 168/102  10/08/14 148/92     Review of Systems  Eyes: Negative for visual disturbance.  Respiratory: Negative for shortness of breath.   Cardiovascular: Negative for chest pain.  Neurological: Negative for dizziness, numbness and headaches.       Past Medical History  Diagnosis Date  . Diverticulosis of colon (without mention of hemorrhage)   . Pure hypercholesterolemia   . Other abnormal glucose   . Essential hypertension, benign   . IBS (irritable bowel syndrome)   . Obesity, unspecified   . Unspecified vitamin D deficiency     resolved  . Left knee pain 2012    eval by ortho - bone spurs and DJD, treated with steroid shot which helped temporarily  . Liver abscess 2015    hospitalization for severe septic shock after diverticulitis  . History of diverticulitis   . DVT (deep venous thrombosis) 2015    bilateral posterior tibial veins treated with IVC filter    Social History   Social History  . Marital Status: Divorced    Spouse Name: N/A  . Number of Children: 1  . Years of Education: N/A   Occupational History  . Analyst; Dept of [employer]    Social History Main Topics  . Smoking status: Former Research scientist (life sciences)  .  Smokeless tobacco: Never Used  . Alcohol Use: Yes     Comment: wine on occasion   . Drug Use: No  . Sexual Activity: No     Comment: By choice   Other Topics Concern  . Not on file   Social History Narrative   Caffeine: 2-3 cups coffee, 2 cups sweet tea   Lives with daughter and grand daughter.  No pets.   Divorced; 1 daughter   Occupation: Administrator at [employer]   Activity: granddaughter.  No regular exercise.   Diet: fruits/vegetables daily, drinks a lot of sweet tea    Past Surgical History  Procedure Laterality Date  . Cesarean section      due to BP elevation  . Colonoscopy  2000    Diverticuli-Dr. Sharlett Iles  . Drain for liver abcess      Family History  Problem Relation Age of Onset  . Hypertension Father   . Cancer Father     prostate  . Heart disease Father     CABG x 2  . Cancer Mother     Lung-quit smoking 20 years prior to Dx  . Hypertension Mother   . Diabetes      + family hx  . Drug abuse Brother   . Stroke Maternal Grandmother   . Sleep apnea Brother     with [employer] estranged  . Colon cancer Neg  Hx     Allergies  Allergen Reactions  . Iodinated Diagnostic Agents Itching  . Lisinopril     REACTION: lethargy, cough.  . Penicillins     REACTION: LYMPH NODES SWELL    Current Facility-Administered Medications on File Prior to Visit  Medication Dose Route Frequency Provider Last Rate Last Dose  . 0.9 %  sodium chloride infusion   Intravenous Continuous Gatha Mayer, MD       Current Outpatient Prescriptions on File Prior to Visit  Medication Sig Dispense Refill  . cetirizine (ZYRTEC) 10 MG tablet Take 10 mg by mouth at bedtime as needed for allergies.     Marland Kitchen dicyclomine (BENTYL) 10 MG capsule Take 10 mg by mouth 4 (four) times daily as needed for spasms.    Marland Kitchen nystatin cream (MYCOSTATIN) Apply 1 application topically 2 (two) times daily as needed for dry skin.      BP 168/102 mmHg  Pulse 73  Temp(Src) 98.2 F (36.8 C) (Oral)  Ht 5\' 2"  (1.575  m)  Wt 280 lb 1.9 oz (127.062 kg)  BMI 51.22 kg/m2  SpO2 98%    Objective:   Physical Exam  Constitutional: She appears well-nourished.  Cardiovascular: Normal rate and regular rhythm.   No ankle edema noted  Pulmonary/Chest: Effort normal and breath sounds normal.  Skin: Skin is warm and dry.  Psychiatric: She has a normal mood and affect.          Assessment & Plan:

## 2014-12-24 NOTE — Progress Notes (Signed)
Pre visit review using our clinic review tool, if applicable. No additional management support is needed unless otherwise documented below in the visit note. 

## 2014-12-24 NOTE — Patient Instructions (Signed)
We will increase amlodipine to 10 mg. You may take two of the 5 mg tablets until your bottle is empty. I have sent the 10 mg tablets to your pharmacy.  Check your blood pressure at home, record your readings, and bring them with you to your next appointment.  Follow up with myself or Dr. Danise Mina in 2 weeks for recheck of blood pressure. Please call us if you develop dizziness, chest pain, headaches, changes in blurred vision.  It was a pleasure meeting you!

## 2015-01-08 ENCOUNTER — Ambulatory Visit: Payer: BC Managed Care – PPO | Admitting: Family Medicine

## 2015-01-09 ENCOUNTER — Ambulatory Visit (INDEPENDENT_AMBULATORY_CARE_PROVIDER_SITE_OTHER): Payer: BC Managed Care – PPO | Admitting: Family Medicine

## 2015-01-09 ENCOUNTER — Encounter: Payer: Self-pay | Admitting: Family Medicine

## 2015-01-09 VITALS — BP 142/92 | HR 80 | Temp 98.1°F | Wt 290.5 lb

## 2015-01-09 DIAGNOSIS — B351 Tinea unguium: Secondary | ICD-10-CM | POA: Diagnosis not present

## 2015-01-09 DIAGNOSIS — I10 Essential (primary) hypertension: Secondary | ICD-10-CM | POA: Diagnosis not present

## 2015-01-09 MED ORDER — AMLODIPINE BESYLATE 5 MG PO TABS: 5.0000 mg | ORAL_TABLET | Freq: Every day | ORAL | Status: DC

## 2015-01-09 MED ORDER — HYDROCHLOROTHIAZIDE 25 MG PO TABS: 25.0000 mg | ORAL_TABLET | Freq: Every day | ORAL | Status: DC

## 2015-01-09 NOTE — Progress Notes (Signed)
Pre visit review using our clinic review tool, if applicable. No additional management support is needed unless otherwise documented below in the visit note. 

## 2015-01-09 NOTE — Patient Instructions (Addendum)
Touch base with Dr Celesta Aver office about rescheduling colonoscopy - once blood pressure has stabilized.  Decrease amlodipine back down to 5mg  daily. I'll refill 5mg  dose. Add on hydrochlorothiazide 25mg  daily. Return in 10 days for lab visit only to check blood work. Return to see me in 3 months for blood pressure check, let us know sooner if consistently >140/90 Continue funginail, let me know if you want to try oral antifungal.

## 2015-01-09 NOTE — Assessment & Plan Note (Signed)
Higher amlodipine dose causing pedal edema - will decrease back to 5mg  daily and add hctz 25mg  daily. RTC 10d lab visit, 3 mo f/u visit. Pt agrees with plan.

## 2015-01-09 NOTE — Assessment & Plan Note (Signed)
Continue funginail. Pt currently not interested in oral antifungal.

## 2015-01-09 NOTE — Progress Notes (Signed)
   BP 142/92 mmHg  Pulse 80  Temp(Src) 98.1 F (36.7 C) (Oral)  Wt 290 lb 8 oz (131.77 kg)   CC: recheck BP  Subjective:    Patient ID: Leah Peterson, female    DOB: April 21, 1956, 59 y.o.   MRN: [id number]  HPI: Leah Peterson is a 59 y.o. female presenting on 01/09/2015 for Follow-up   See prior note for details. Saw [name] after bp too high at colonoscopy appointment - so colonoscopy had to be canceled. Pt had decided to self stop meds "I just ran out".   She has been more compliant with blood pressure medication amlodipine 10mg  daily but has now noticed ankle swelling since being on higher dose. No HA, vision changes, CP/tightness, SOB. Endorses good potassium intake - tomatoes, cantaloups, etc.  Increased stress - renovating deceased father's house.   Fungal infection of L fingernail.   Relevant past medical, surgical, family and social history reviewed and updated as indicated. Interim medical history since our last visit reviewed. Allergies and medications reviewed and updated. Current Outpatient Prescriptions on File Prior to Visit  Medication Sig  . cetirizine (ZYRTEC) 10 MG tablet Take 10 mg by mouth at bedtime as needed for allergies.   Marland Kitchen dicyclomine (BENTYL) 10 MG capsule Take 10 mg by mouth 4 (four) times daily as needed for spasms.  Marland Kitchen nystatin cream (MYCOSTATIN) Apply 1 application topically 2 (two) times daily as needed for dry skin.   No current facility-administered medications on file prior to visit.    Review of Systems Per HPI unless specifically indicated above     Objective:    BP 142/92 mmHg  Pulse 80  Temp(Src) 98.1 F (36.7 C) (Oral)  Wt 290 lb 8 oz (131.77 kg)  Wt Readings from Last 3 Encounters:  01/09/15 290 lb 8 oz (131.77 kg)  12/24/14 280 lb 1.9 oz (127.062 kg)  12/23/14 280 lb (127.007 kg)    Physical Exam  Constitutional: She appears well-developed and well-nourished. No distress.  Morbidly obese  HENT:  Mouth/Throat: Oropharynx  is clear and moist. No oropharyngeal exudate.  Cardiovascular: Normal rate, regular rhythm, normal heart sounds and intact distal pulses.   No murmur heard. Pulmonary/Chest: Effort normal and breath sounds normal. No respiratory distress. She has no wheezes. She has no rales.  Musculoskeletal: She exhibits edema (marked nonpitting bilateral pedal edema).  Skin: Skin is warm and dry.  Psychiatric: She has a normal mood and affect.  Nursing note and vitals reviewed.  Results for orders placed or performed in visit on 10/16/14  HM MAMMOGRAPHY  Result Value Ref Range   HM Mammogram Normal Birads 1-Repeat 1 year       Assessment & Plan:   Problem List Items Addressed This Visit    HYPERTENSION, BENIGN ESSENTIAL - Primary    Higher amlodipine dose causing pedal edema - will decrease back to 5mg  daily and add hctz 25mg  daily. RTC 10d lab visit, 3 mo f/u visit. Pt agrees with plan.      Relevant Medications   amLODipine (NORVASC) 5 MG tablet   hydrochlorothiazide (HYDRODIURIL) 25 MG tablet   Other Relevant Orders   Basic metabolic panel   Onychomycosis    Continue funginail. Pt currently not interested in oral antifungal.          Follow up plan: Return in about 3 months (around 04/10/2015), or as needed, for follow up visit.

## 2015-01-21 ENCOUNTER — Other Ambulatory Visit (INDEPENDENT_AMBULATORY_CARE_PROVIDER_SITE_OTHER): Payer: BC Managed Care – PPO

## 2015-01-21 ENCOUNTER — Other Ambulatory Visit: Payer: BC Managed Care – PPO

## 2015-01-21 DIAGNOSIS — I10 Essential (primary) hypertension: Secondary | ICD-10-CM

## 2015-01-21 LAB — BASIC METABOLIC PANEL
BUN: 17 mg/dL (ref 6–23)
CALCIUM: 9.5 mg/dL (ref 8.4–10.5)
CO2: 34 meq/L — AB (ref 19–32)
CREATININE: 0.99 mg/dL (ref 0.40–1.20)
Chloride: 100 mEq/L (ref 96–112)
GFR: 74.37 mL/min (ref >60.00)
Glucose, Bld: 97 mg/dL (ref 70–99)
Potassium: 3.9 mEq/L (ref 3.5–5.1)
Sodium: 139 mEq/L (ref 135–145)

## 2015-02-13 ENCOUNTER — Telehealth: Payer: Self-pay | Admitting: Family Medicine

## 2015-02-13 NOTE — Telephone Encounter (Signed)
Chart updated

## 2015-02-13 NOTE — Telephone Encounter (Signed)
Pt dropped off medical records asking for [place] to update her chart. She has received the flu shot and had an eye exam. Placing ppw in Dr. Danise Mina tower slot.

## 2015-03-31 ENCOUNTER — Encounter: Payer: Self-pay | Admitting: Internal Medicine

## 2015-04-10 ENCOUNTER — Ambulatory Visit (INDEPENDENT_AMBULATORY_CARE_PROVIDER_SITE_OTHER): Payer: BC Managed Care – PPO | Admitting: Family Medicine

## 2015-04-10 ENCOUNTER — Encounter: Payer: Self-pay | Admitting: Family Medicine

## 2015-04-10 VITALS — BP 130/80 | HR 68 | Temp 97.5°F | Wt 286.5 lb

## 2015-04-10 DIAGNOSIS — R202 Paresthesia of skin: Secondary | ICD-10-CM

## 2015-04-10 DIAGNOSIS — I10 Essential (primary) hypertension: Secondary | ICD-10-CM | POA: Diagnosis not present

## 2015-04-10 DIAGNOSIS — Z6841 Body Mass Index (BMI) 40.0 and over, adult: Secondary | ICD-10-CM

## 2015-04-10 NOTE — Assessment & Plan Note (Signed)
Chronic, stable. Continue current regimen. 

## 2015-04-10 NOTE — Patient Instructions (Addendum)
You are doing great today! Blood pressure looking good. Continue medicines. Return in 6 months for follow up visit.

## 2015-04-10 NOTE — Assessment & Plan Note (Addendum)
Bilateral thigh paresthesias - ?meralgia paresthetica. Check B12 next labwork.

## 2015-04-10 NOTE — Progress Notes (Signed)
Pre visit review using our clinic review tool, if applicable. No additional management support is needed unless otherwise documented below in the visit note. 

## 2015-04-10 NOTE — Assessment & Plan Note (Signed)
Body mass index is 52.39 kg/(m^2).

## 2015-04-10 NOTE — Progress Notes (Signed)
BP 130/80 mmHg  Pulse 68  Temp(Src) 97.5 F (36.4 C) (Oral)  Wt 286 lb 8 oz (129.956 kg)   CC: f/u visit  Subjective:    Patient ID: Leah Peterson, female    DOB: [birth year], 57 y.o.   MRN: [id number]  HPI: Leah Peterson is a 59 y.o. female presenting on 04/10/2015 for Follow-up   HTN - Recent hypertensive urgency leading to cancellation of colonoscopy. Compliant with current antihypertensive regimen of amlodipine 5mg  daily and hctz 25mg  daily. Higher amlodipine caused pedal ankle edema. Does check blood pressures at home: good control endorsed.  No low blood pressure readings or symptoms of dizziness/syncope.  Denies HA, vision changes, CP/tightness, SOB, leg swelling.    Noticing some paresthesias over last few months described as tingling and heat of lateral thighs bilaterally, no numbness or burning pain. Worse at night time.   H/o liver abscess. No abd pain, fevers, normal bowels.   Has not rescheduled colonoscopy. Planning to do this in February.   Relevant past medical, surgical, family and social history reviewed and updated as indicated. Interim medical history since our last visit reviewed. Allergies and medications reviewed and updated. Current Outpatient Prescriptions on File Prior to Visit  Medication Sig  . amLODipine (NORVASC) 5 MG tablet Take 1 tablet (5 mg total) by mouth daily.  . cetirizine (ZYRTEC) 10 MG tablet Take 10 mg by mouth at bedtime as needed for allergies.   Marland Kitchen dicyclomine (BENTYL) 10 MG capsule Take 10 mg by mouth 4 (four) times daily as needed for spasms.  . hydrochlorothiazide (HYDRODIURIL) 25 MG tablet Take 1 tablet (25 mg total) by mouth daily.  Marland Kitchen nystatin cream (MYCOSTATIN) Apply 1 application topically 2 (two) times daily as needed for dry skin.   No current facility-administered medications on file prior to visit.    Review of Systems Per HPI unless specifically indicated in ROS section     Objective:    BP 130/80 mmHg  Pulse  68  Temp(Src) 97.5 F (36.4 C) (Oral)  Wt 286 lb 8 oz (129.956 kg)  Wt Readings from Last 3 Encounters:  04/10/15 286 lb 8 oz (129.956 kg)  01/09/15 290 lb 8 oz (131.77 kg)  12/24/14 280 lb 1.9 oz (127.062 kg)   Body mass index is 52.39 kg/(m^2).  Physical Exam  Constitutional: She appears well-developed and well-nourished. No distress.  HENT:  Mouth/Throat: Oropharynx is clear and moist. No oropharyngeal exudate.  Cardiovascular: Normal rate, regular rhythm, normal heart sounds and intact distal pulses.   No murmur heard. Pulmonary/Chest: Effort normal and breath sounds normal. No respiratory distress. She has no wheezes. She has no rales.  Musculoskeletal: She exhibits no edema.  Skin: Skin is warm and dry. No rash noted.  Nursing note and vitals reviewed.  Results for orders placed or performed in visit on [id number]  Basic metabolic panel  Result Value Ref Range   Sodium 139 135 - 145 mEq/L   Potassium 3.9 3.5 - 5.1 mEq/L   Chloride 100 96 - 112 mEq/L   CO2 34 (H) 19 - 32 mEq/L   Glucose, Bld 97 70 - 99 mg/dL   BUN 17 6 - 23 mg/dL   Creatinine, Ser 0.99 0.40 - 1.20 mg/dL   Calcium 9.5 8.4 - 10.5 mg/dL   GFR 74.37 >60.00 mL/min      Assessment & Plan:   Problem List Items Addressed This Visit    Morbid obesity with BMI of 50.0-59.9, adult ([place])  Body mass index is 52.39 kg/(m^2).       HYPERTENSION, BENIGN ESSENTIAL - Primary    Chronic, stable. Continue current regimen.      Bilateral leg paresthesia    Bilateral thigh paresthesias - ?meralgia paresthetica. Check B12 next labwork.          Follow up plan: Return in about 6 months (around 10/09/2015), or as needed, for annual exam, prior fasting for blood work.

## 2015-05-05 ENCOUNTER — Other Ambulatory Visit: Payer: Self-pay

## 2015-05-05 ENCOUNTER — Telehealth: Payer: Self-pay | Admitting: Internal Medicine

## 2015-05-05 DIAGNOSIS — Z1211 Encounter for screening for malignant neoplasm of colon: Secondary | ICD-10-CM

## 2015-05-05 NOTE — Telephone Encounter (Signed)
I spoke with the patient and she is available 06/29/14.  She is advised [facility] endo staff gone for the day and that I will call her tomorrow with a time

## 2015-05-05 NOTE — Telephone Encounter (Signed)
Left message for patient to call back  

## 2015-05-06 NOTE — Telephone Encounter (Signed)
Patient notified of the appt date and times.  She has her instructions from earlier.

## 2015-05-06 NOTE — Telephone Encounter (Signed)
Patient is scheduled for 06/29/14 8:30 [facility].   Left message for patient to call back

## 2015-05-22 ENCOUNTER — Telehealth: Payer: Self-pay

## 2015-05-22 NOTE — Telephone Encounter (Signed)
Pt wants to have amlodipine sent to [facility]; refills are available at [place]; [facility] will have [facility] contact [facility] for transfer.pharmacy profile updated.

## 2015-06-16 ENCOUNTER — Encounter (HOSPITAL_COMMUNITY): Payer: Self-pay | Admitting: *Deleted

## 2015-06-26 ENCOUNTER — Telehealth: Payer: Self-pay | Admitting: Internal Medicine

## 2015-06-26 NOTE — Telephone Encounter (Signed)
All questions answered.  She will call back for any additional questions or concerns.   

## 2015-06-30 ENCOUNTER — Ambulatory Visit (HOSPITAL_COMMUNITY)
Admission: RE | Admit: 2015-06-30 | Discharge: 2015-06-30 | Disposition: A | Discharge status: Home / Self Care | Payer: BC Managed Care – PPO | Source: Ambulatory Visit | Attending: Internal Medicine | Admitting: Internal Medicine

## 2015-06-30 ENCOUNTER — Ambulatory Visit (HOSPITAL_COMMUNITY): Payer: BC Managed Care – PPO | Admitting: Registered Nurse

## 2015-06-30 ENCOUNTER — Encounter (HOSPITAL_COMMUNITY): Payer: Self-pay

## 2015-06-30 ENCOUNTER — Encounter (HOSPITAL_COMMUNITY)
Admission: RE | Disposition: A | Discharge status: Home / Self Care | Payer: Self-pay | Source: Ambulatory Visit | Attending: Internal Medicine

## 2015-06-30 DIAGNOSIS — Z6841 Body Mass Index (BMI) 40.0 and over, adult: Secondary | ICD-10-CM | POA: Diagnosis not present

## 2015-06-30 DIAGNOSIS — Z79899 Other long term (current) drug therapy: Secondary | ICD-10-CM | POA: Insufficient documentation

## 2015-06-30 DIAGNOSIS — I10 Essential (primary) hypertension: Secondary | ICD-10-CM | POA: Insufficient documentation

## 2015-06-30 DIAGNOSIS — E7800 Pure hypercholesterolemia, unspecified: Secondary | ICD-10-CM | POA: Insufficient documentation

## 2015-06-30 DIAGNOSIS — Z1211 Encounter for screening for malignant neoplasm of colon: Secondary | ICD-10-CM | POA: Insufficient documentation

## 2015-06-30 DIAGNOSIS — Z86718 Personal history of other venous thrombosis and embolism: Secondary | ICD-10-CM | POA: Insufficient documentation

## 2015-06-30 DIAGNOSIS — K5730 Diverticulosis of large intestine without perforation or abscess without bleeding: Secondary | ICD-10-CM | POA: Insufficient documentation

## 2015-06-30 DIAGNOSIS — K589 Irritable bowel syndrome without diarrhea: Secondary | ICD-10-CM | POA: Diagnosis not present

## 2015-06-30 DIAGNOSIS — Z87891 Personal history of nicotine dependence: Secondary | ICD-10-CM | POA: Diagnosis not present

## 2015-06-30 HISTORY — PX: COLONOSCOPY WITH PROPOFOL: SHX5780

## 2015-06-30 HISTORY — DX: Personal history of other medical treatment: Z92.89

## 2015-06-30 LAB — GLUCOSE, CAPILLARY: Glucose-Capillary: 130 mg/dL — ABNORMAL HIGH (ref 65–99)

## 2015-06-30 SURGERY — COLONOSCOPY WITH PROPOFOL
Anesthesia: Moderate Sedation

## 2015-06-30 MED ORDER — DIPHENHYDRAMINE HCL 50 MG/ML IJ SOLN
INTRAMUSCULAR | Status: DC | PRN
  Administered 2015-06-30: 25 mg via INTRAVENOUS

## 2015-06-30 MED ORDER — FENTANYL CITRATE (PF) 100 MCG/2ML IJ SOLN
INTRAMUSCULAR | Status: DC | PRN
  Administered 2015-06-30 (×3): 25 ug via INTRAVENOUS

## 2015-06-30 MED ORDER — FENTANYL CITRATE (PF) 100 MCG/2ML IJ SOLN
INTRAMUSCULAR | Status: AC
  Filled 2015-06-30: qty 2

## 2015-06-30 MED ORDER — DIPHENHYDRAMINE HCL 50 MG/ML IJ SOLN
INTRAMUSCULAR | Status: AC
  Filled 2015-06-30: qty 1

## 2015-06-30 MED ORDER — MIDAZOLAM HCL 5 MG/ML IJ SOLN
INTRAMUSCULAR | Status: AC
  Filled 2015-06-30: qty 2

## 2015-06-30 MED ORDER — MIDAZOLAM HCL 5 MG/5ML IJ SOLN
INTRAMUSCULAR | Status: DC | PRN
  Administered 2015-06-30: 2 mg via INTRAVENOUS
  Administered 2015-06-30: 1 mg via INTRAVENOUS
  Administered 2015-06-30: 2 mg via INTRAVENOUS

## 2015-06-30 MED ORDER — PROPOFOL 10 MG/ML IV BOLUS
INTRAVENOUS | Status: AC
  Filled 2015-06-30: qty 60

## 2015-06-30 SURGICAL SUPPLY — 22 items

## 2015-06-30 NOTE — Op Note (Signed)
[facility] [place], [address]   COLONOSCOPY PROCEDURE REPORT  PATIENT: Leah Peterson, [name]  MR#: [id number] BIRTHDATE: February 16, 1956 , 57  yrs. old GENDER: female ENDOSCOPIST: Gatha Mayer, MD, [facility] PROCEDURE DATE:  06/30/2015 PROCEDURE:   Colonoscopy, screening First Screening Colonoscopy - Avg.  risk and is 50 yrs.  old or older Yes.  Prior Negative Screening - Now for repeat screening. N/A  History of Adenoma - Now for follow-up colonoscopy & has been > or = to 3 yrs.  N/A  Polyps removed today? No Recommend repeat exam, <10 yrs? No ASA CLASS:   Class III INDICATIONS:Screening for colonic neoplasia and Colorectal Neoplasm Risk Assessment for this procedure is average risk. MEDICATIONS: Benadryl 25 mg IV, Fentanyl 75 mcg IV, and Versed 5 mg IV  DESCRIPTION OF PROCEDURE:   After the risks benefits and alternatives of the procedure were thoroughly explained, informed consent was obtained.  The digital rectal exam revealed no abnormalities of the rectum.   The Pentax Ped Colon [id number] endoscope was introduced through the anus and advanced to the cecum, which was identified by both the appendix and ileocecal valve. No adverse events experienced.   The quality of the prep was adequate (MiraLax was used)  The instrument was then slowly withdrawn as the colon was fully examined. Estimated blood loss is zero unless otherwise noted in this procedure report.      COLON FINDINGS: There was diverticulosis noted in the sigmoid colon. The examination was otherwise normal.  Retroflexed views revealed no abnormalities. The time to cecum = 4.0 Withdrawal time = 18.4 The scope was withdrawn and the procedure completed. COMPLICATIONS: There were no immediate complications.  ENDOSCOPIC IMPRESSION: 1.   Diverticulosis was noted in the sigmoid colon 2.   The examination was otherwise normal  RECOMMENDATIONS: Repeat colonoscopy 10 years.  Would do more  aggressive prep - this was adequate after irrigation/suctioning.  BP elevated pre-procedure - it fell appropriately with sedation and tolerated well. Suspect anxiety driven but should f/u as utpatient as planned.  eSigned:  Gatha Mayer, MD, [facility] 06/30/2015 9:52 AM   cc: Dr. Ria Bush and The Patient

## 2015-06-30 NOTE — H&P (Signed)
[facility] History and Physical   Primary Care Physician:  Ria Bush, MD   Reason for Procedure:   colon cancer screening  Plan:    Colonoscopy     The risks and benefits as well as alternatives of endoscopic procedure(s) have been discussed and reviewed. All questions answered. The patient agrees to proceed.  HPI: Leah Peterson is a 60 y.o. female without GI sxs here for a screening colonoscopy. BP 180/100 today. No associated sxs of headache, visual changes. She is anxious about having the colonoscopy.   Past Medical History  Diagnosis Date  . Diverticulosis of colon (without mention of hemorrhage)   . Pure hypercholesterolemia   . Other abnormal glucose   . IBS (irritable bowel syndrome)   . Obesity, unspecified   . Unspecified vitamin D deficiency     resolved  . Left knee pain 2012    eval by ortho - bone spurs and DJD, treated with steroid shot which helped temporarily  . Liver abscess 2015    hospitalization for severe septic shock after diverticulitis  . History of diverticulitis   . DVT (deep venous thrombosis) ([place]) 2015    bilateral posterior tibial veins treated with IVC filter  . Essential hypertension, benign     Blood pressure tends to elevate with MD visits.  . Transfusion history     '15-"diverticulosis"    Past Surgical History  Procedure Laterality Date  . Cesarean section      due to BP elevation  . Colonoscopy  2000    Diverticuli-Dr. Sharlett Iles  . Drain for liver abcess      Prior to Admission medications   Medication Sig Start Date End Date Taking? Authorizing Provider  amLODipine (NORVASC) 5 MG tablet Take 1 tablet (5 mg total) by mouth daily. 01/09/15  Yes Ria Bush, MD  cetirizine (ZYRTEC) 10 MG tablet Take 10 mg by mouth at bedtime as needed for allergies.    Yes Historical Provider, MD  dicyclomine (BENTYL) 10 MG capsule Take 10 mg by mouth 4 (four) times daily as needed for spasms. 10/16/13  Yes Ria Bush, MD  hydrochlorothiazide (HYDRODIURIL) 25 MG tablet Take 1 tablet (25 mg total) by mouth daily. 01/09/15  Yes Ria Bush, MD  Multiple Vitamin (MULTIVITAMIN WITH MINERALS) TABS tablet Take 1 tablet by mouth daily.   Yes Historical Provider, MD  nystatin cream (MYCOSTATIN) Apply 1 application topically 2 (two) times daily as needed for dry skin. 10/16/13  Yes Ria Bush, MD    No current facility-administered medications for this encounter.    Allergies as of 05/05/2015 - Review Complete 04/10/2015  Allergen Reaction Noted  . Iodinated diagnostic agents Itching 11/13/2013  . Lisinopril  10/08/2008  . Penicillins  06/11/2007    Family History  Problem Relation Age of Onset  . Hypertension Father   . Cancer Father     prostate  . Heart disease Father     CABG x 2  . Cancer Mother     Lung-quit smoking 20 years prior to Dx  . Hypertension Mother   . Diabetes      + family hx  . Drug abuse Brother   . Stroke Maternal Grandmother   . Sleep apnea Brother     with [facility] estranged  . Colon cancer Neg Hx     Social History   Social History  . Marital Status: Divorced    Spouse Name: N/A  . Number of Children: 1  . Years of  Education: N/A   Occupational History  . Analyst; [employer] of [employer]    Social History Main Topics  . Smoking status: Former Smoker -- 3 years    Quit date: 06/15/1977  . Smokeless tobacco: Never Used  . Alcohol Use: Yes     Comment: wine on occasion   . Drug Use: No  . Sexual Activity: No     Comment: By choice   Other Topics Concern  . Not on file   Social History Narrative   Caffeine: 2-3 cups coffee, 2 cups sweet tea   Lives with daughter and grand daughter.  No pets.   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.   Activity: granddaughter.  No regular exercise.   Diet: fruits/vegetables daily, drinks a lot of sweet tea    Review of Systems: Positive for anxiety All other  review of systems negative except as mentioned in the HPI.  Physical Exam: Vital signs in last 24 hours: reviewed BP 180/100     General:   Alert,  Well-developed, well-nourished, pleasant and cooperative in NAD Lungs:  Clear throughout to auscultation.   Heart:  Regular rate and rhythm; no murmurs, clicks, rubs,  or gallops. Abdomen:  Soft, nontender and nondistended. Normal bowel sounds.  obese Neuro/Psych:  Alert and cooperative. Normal mood and affect. A and O x 3   @Shaylynne Lunt  Simonne Maffucci, MD, [facility] [phone] (pager) 06/30/2015 8:20 AM@

## 2015-06-30 NOTE — Discharge Instructions (Addendum)
° °  No polyps or cancer seen. Next routine colonoscopy in 10 years - 2027  You do have a condition called diverticulosis - common and not usually a problem. Please read the handout provided.  YOU HAD AN ENDOSCOPIC PROCEDURE TODAY: Refer to the procedure report and other information in the discharge instructions given to you for any specific questions about what was found during the examination. If this information does not answer your questions, please call Dr. Celesta Aver office at [phone] to clarify.   YOU SHOULD EXPECT: Some feelings of bloating in the abdomen. Passage of more gas than usual. Walking can help get rid of the air that was put into your GI tract during the procedure and reduce the bloating. If you had a lower endoscopy (such as a colonoscopy or flexible sigmoidoscopy) you may notice spotting of blood in your stool or on the toilet paper. Some abdominal soreness may be present for a day or two, also.  DIET: Your first meal following the procedure should be a light meal and then it is ok to progress to your normal diet. A half-sandwich or bowl of soup is an example of a good first meal. Heavy or fried foods are harder to digest and may make you feel nauseous or bloated. Drink plenty of fluids but you should avoid alcoholic beverages for 24 hours.   ACTIVITY: Your care partner should take you home directly after the procedure. You should plan to take it easy, moving slowly for the rest of the day. You can resume normal activity the day after the procedure however YOU SHOULD NOT DRIVE, use power tools, machinery or perform tasks that involve climbing or major physical exertion for 24 hours (because of the sedation medicines used during the test).   SYMPTOMS TO REPORT IMMEDIATELY: A gastroenterologist can be reached at any hour. Please call [phone]  for any of the following symptoms:  Following lower endoscopy (colonoscopy, flexible sigmoidoscopy) Excessive amounts of blood  in the stool  Significant tenderness, worsening of abdominal pains  Swelling of the abdomen that is new, acute  Fever of 100 or higher  Following upper endoscopy (EGD, EUS, ERCP, esophageal dilation) Vomiting of blood or coffee ground material  New, significant abdominal pain  New, significant chest pain or pain under the shoulder blades  Painful or persistently difficult swallowing  New shortness of breath  Black, tarry-looking or red, bloody stools  FOLLOW UP:  If any biopsies were taken you will be contacted by phone or by letter within the next 1-3 weeks. Call [phone]  if you have not heard about the biopsies in 3 weeks.  Please also call with any specific questions about appointments or follow up tests.

## 2015-06-30 NOTE — Anesthesia Preprocedure Evaluation (Addendum)
Anesthesia Evaluation  Patient identified by MRN, date of birth, ID band Patient awake    Reviewed: Allergy & Precautions, NPO status , Patient's Chart, lab work & pertinent test results  History of Anesthesia Complications Negative for: history of anesthetic complications  Airway Mallampati: II  TM Distance: >3 FB Neck ROM: Full    Dental  (+) Dental Advisory Given   Pulmonary neg pulmonary ROS, former smoker,    breath sounds clear to auscultation       Cardiovascular hypertension, Pt. on medications (-) angina+ DVT   Rhythm:Regular Rate:Normal  '15 ECHO: EF 55-60%, valves OK   Neuro/Psych negative neurological ROS     GI/Hepatic negative GI ROS, Neg liver ROS,   Endo/Other  Morbid obesity  Renal/GU negative Renal ROS     Musculoskeletal   Abdominal (+) + obese,   Peds  Hematology negative hematology ROS (+)   Anesthesia Other Findings   Reproductive/Obstetrics                           Anesthesia Physical Anesthesia Plan  ASA: III  Anesthesia Plan:    Post-op Pain Management:    Induction:   Airway Management Planned:   Additional Equipment:   Intra-op Plan:   Post-operative Plan:   Informed Consent:   Plan Discussed with: CRNA and Surgeon  Anesthesia Plan Comments: (Pt cancelled pending better BP control Diastolic BP [id number] with multiple check today)        Anesthesia Quick Evaluation

## 2015-07-01 ENCOUNTER — Encounter (HOSPITAL_COMMUNITY): Payer: Self-pay | Admitting: Internal Medicine

## 2015-08-13 ENCOUNTER — Other Ambulatory Visit: Payer: Self-pay

## 2015-08-13 MED ORDER — HYDROCHLOROTHIAZIDE 25 MG PO TABS: 25.0000 mg | ORAL_TABLET | Freq: Every day | ORAL | Status: DC

## 2015-08-13 NOTE — Telephone Encounter (Signed)
Pt left note requesting refill HCTZ to [facility]; last seen 04/10/15. Refilled per protocol. Left v/m refill done to [phone] per DPR.

## 2015-09-17 ENCOUNTER — Other Ambulatory Visit: Payer: Self-pay

## 2015-09-17 DIAGNOSIS — Z1231 Encounter for screening mammogram for malignant neoplasm of breast: Secondary | ICD-10-CM

## 2015-10-07 ENCOUNTER — Encounter: Payer: Self-pay | Admitting: Family Medicine

## 2015-10-07 ENCOUNTER — Other Ambulatory Visit (INDEPENDENT_AMBULATORY_CARE_PROVIDER_SITE_OTHER): Payer: BC Managed Care – PPO

## 2015-10-07 ENCOUNTER — Other Ambulatory Visit: Payer: Self-pay | Admitting: Family Medicine

## 2015-10-07 DIAGNOSIS — I10 Essential (primary) hypertension: Secondary | ICD-10-CM

## 2015-10-07 DIAGNOSIS — R7303 Prediabetes: Secondary | ICD-10-CM | POA: Diagnosis not present

## 2015-10-07 DIAGNOSIS — E559 Vitamin D deficiency, unspecified: Secondary | ICD-10-CM

## 2015-10-07 DIAGNOSIS — K750 Abscess of liver: Secondary | ICD-10-CM

## 2015-10-07 DIAGNOSIS — E785 Hyperlipidemia, unspecified: Secondary | ICD-10-CM

## 2015-10-07 LAB — CBC WITH DIFFERENTIAL/PLATELET
BASOS ABS: 0 10*3/uL (ref 0.0–0.1)
Basophils Relative: 0.5 % (ref 0.0–3.0)
EOS ABS: 0 10*3/uL (ref 0.0–0.7)
Eosinophils Relative: 0.4 % (ref 0.0–5.0)
HEMATOCRIT: 41.9 % (ref 36.0–46.0)
Hemoglobin: 14 g/dL (ref 12.0–15.0)
LYMPHS PCT: 45.4 % (ref 12.0–46.0)
Lymphs Abs: 3 10*3/uL (ref 0.7–4.0)
MCHC: 33.3 g/dL (ref 30.0–36.0)
MCV: 83.9 fl (ref 78.0–100.0)
MONOS PCT: 6.9 % (ref 3.0–12.0)
Monocytes Absolute: 0.5 10*3/uL (ref 0.1–1.0)
NEUTROS ABS: 3.1 10*3/uL (ref 1.4–7.7)
NEUTROS PCT: 46.8 % (ref 43.0–77.0)
PLATELETS: 264 10*3/uL (ref 150.0–400.0)
RBC: 5 Mil/uL (ref 3.87–5.11)
RDW: 14.2 % (ref 11.5–15.5)
WBC: 6.7 10*3/uL (ref 4.0–10.5)

## 2015-10-07 LAB — MICROALBUMIN / CREATININE URINE RATIO
CREATININE, U: 577.1 mg/dL
MICROALB/CREAT RATIO: 0.8 mg/g (ref 0.0–30.0)
Microalb, Ur: 4.5 mg/dL — ABNORMAL HIGH (ref 0.0–1.9)

## 2015-10-07 LAB — VITAMIN D 25 HYDROXY (VIT D DEFICIENCY, FRACTURES): VITD: 22.68 ng/mL — ABNORMAL LOW (ref 30.00–100.00)

## 2015-10-07 LAB — COMPREHENSIVE METABOLIC PANEL
ALBUMIN: 4 g/dL (ref 3.5–5.2)
ALK PHOS: 56 U/L (ref 39–117)
ALT: 10 U/L (ref 0–35)
AST: 12 U/L (ref 0–37)
BILIRUBIN TOTAL: 0.6 mg/dL (ref 0.2–1.2)
BUN: 27 mg/dL — ABNORMAL HIGH (ref 6–23)
CALCIUM: 9.6 mg/dL (ref 8.4–10.5)
CO2: 28 mEq/L (ref 19–32)
Chloride: 99 mEq/L (ref 96–112)
Creatinine, Ser: 1.83 mg/dL — ABNORMAL HIGH (ref 0.40–1.20)
GFR: 36.51 mL/min — AB (ref >60.00)
Glucose, Bld: 116 mg/dL — ABNORMAL HIGH (ref 70–99)
POTASSIUM: 3.5 meq/L (ref 3.5–5.1)
Sodium: 137 mEq/L (ref 135–145)
TOTAL PROTEIN: 8.1 g/dL (ref 6.0–8.3)

## 2015-10-07 LAB — LIPID PANEL
CHOLESTEROL: 211 mg/dL — AB (ref 0–200)
HDL: 50.7 mg/dL (ref >39.00)
LDL Cholesterol: 142 mg/dL — ABNORMAL HIGH (ref 0–99)
NONHDL: 160.13
TRIGLYCERIDES: 91 mg/dL (ref 0.0–149.0)
Total CHOL/HDL Ratio: 4
VLDL: 18.2 mg/dL (ref 0.0–40.0)

## 2015-10-07 LAB — HEMOGLOBIN A1C: HEMOGLOBIN A1C: 6 % (ref 4.6–6.5)

## 2015-10-14 ENCOUNTER — Encounter: Payer: Self-pay | Admitting: Family Medicine

## 2015-10-14 ENCOUNTER — Ambulatory Visit (INDEPENDENT_AMBULATORY_CARE_PROVIDER_SITE_OTHER): Payer: BC Managed Care – PPO | Admitting: Family Medicine

## 2015-10-14 VITALS — BP 128/84 | HR 76 | Temp 98.1°F | Ht 62.0 in | Wt 272.2 lb

## 2015-10-14 DIAGNOSIS — R634 Abnormal weight loss: Secondary | ICD-10-CM

## 2015-10-14 DIAGNOSIS — E559 Vitamin D deficiency, unspecified: Secondary | ICD-10-CM | POA: Diagnosis not present

## 2015-10-14 DIAGNOSIS — R202 Paresthesia of skin: Secondary | ICD-10-CM

## 2015-10-14 DIAGNOSIS — R7303 Prediabetes: Secondary | ICD-10-CM

## 2015-10-14 DIAGNOSIS — E785 Hyperlipidemia, unspecified: Secondary | ICD-10-CM | POA: Diagnosis not present

## 2015-10-14 DIAGNOSIS — N179 Acute kidney failure, unspecified: Secondary | ICD-10-CM

## 2015-10-14 DIAGNOSIS — Z6841 Body Mass Index (BMI) 40.0 and over, adult: Secondary | ICD-10-CM

## 2015-10-14 DIAGNOSIS — I10 Essential (primary) hypertension: Secondary | ICD-10-CM

## 2015-10-14 DIAGNOSIS — R1111 Vomiting without nausea: Secondary | ICD-10-CM | POA: Diagnosis not present

## 2015-10-14 DIAGNOSIS — R1115 Cyclical vomiting syndrome unrelated to migraine: Secondary | ICD-10-CM | POA: Insufficient documentation

## 2015-10-14 DIAGNOSIS — Z0000 Encounter for general adult medical examination without abnormal findings: Secondary | ICD-10-CM | POA: Diagnosis not present

## 2015-10-14 LAB — RENAL FUNCTION PANEL
Albumin: 4 g/dL (ref 3.5–5.2)
BUN: 16 mg/dL (ref 6–23)
CHLORIDE: 99 meq/L (ref 96–112)
CO2: 31 meq/L (ref 19–32)
Calcium: 9.5 mg/dL (ref 8.4–10.5)
Creatinine, Ser: 1.14 mg/dL (ref 0.40–1.20)
GFR: 63.04 mL/min (ref >60.00)
Glucose, Bld: 101 mg/dL — ABNORMAL HIGH (ref 70–99)
PHOSPHORUS: 3.4 mg/dL (ref 2.3–4.6)
Potassium: 3.7 mEq/L (ref 3.5–5.1)
Sodium: 138 mEq/L (ref 135–145)

## 2015-10-14 LAB — LIPASE: LIPASE: 19 U/L (ref 11.0–59.0)

## 2015-10-14 LAB — VITAMIN B12: Vitamin B-12: 463 pg/mL (ref 211–911)

## 2015-10-14 NOTE — Assessment & Plan Note (Signed)
Chronic, discussed avoiding added sugars.

## 2015-10-14 NOTE — Assessment & Plan Note (Signed)
Chronic, stable. Continue current regimen. 

## 2015-10-14 NOTE — Assessment & Plan Note (Signed)
rec restart vit D 1000 IU daily. 

## 2015-10-14 NOTE — Assessment & Plan Note (Signed)
New - ?related to vomiting episodes. Doubt med related. Recheck renal panel today along with abd [place] - see below.

## 2015-10-14 NOTE — Patient Instructions (Addendum)
Recheck kidneys today with labs Pass by Allison's office for abdominal ultrasound and return to Dr Gessner for vomiting with weight loss. Restart vitamin D 1000 units daily Good to see you today, call us with questions. Return as needed or in 6 months for follow up visit.  Health Maintenance, Female Adopting a healthy lifestyle and getting preventive care can go a long way to promote health and wellness. Talk with your health care provider about what schedule of regular examinations is right for you. This is a good chance for you to check in with your provider about disease prevention and staying healthy. In between checkups, there are plenty of things you can do on your own. Experts have done a lot of research about which lifestyle changes and preventive measures are most likely to keep you healthy. Ask your health care provider for more information. WEIGHT AND DIET  Eat a healthy diet  Be sure to include plenty of vegetables, fruits, low-fat dairy products, and lean protein.  Do not eat a lot of foods high in solid fats, added sugars, or salt.  Get regular exercise. This is one of the most important things you can do for your health.  Most adults should exercise for at least 150 minutes each week. The exercise should increase your heart rate and make you sweat (moderate-intensity exercise).  Most adults should also do strengthening exercises at least twice a week. This is in addition to the moderate-intensity exercise.  Maintain a healthy weight  Body mass index (BMI) is a measurement that can be used to identify possible weight problems. It estimates body fat based on height and weight. Your health care provider can help determine your BMI and help you achieve or maintain a healthy weight.  For females 20 years of age and older:   A BMI below 18.5 is considered underweight.  A BMI of 18.5 to 24.9 is normal.  A BMI of 25 to 29.9 is considered overweight.  A BMI of 30 and above is  considered obese.  Watch levels of cholesterol and blood lipids  You should start having your blood tested for lipids and cholesterol at 58 years of age, then have this test every 5 years.  You may need to have your cholesterol levels checked more often if:  Your lipid or cholesterol levels are high.  You are older than 58 years of age.  You are at high risk for heart disease.  CANCER SCREENING   Lung Cancer  Lung cancer screening is recommended for adults 55-80 years old who are at high risk for lung cancer because of a history of smoking.  A yearly low-dose CT scan of the lungs is recommended for people who:  Currently smoke.  Have quit within the past 15 years.  Have at least a 30-pack-year history of smoking. A pack year is smoking an average of one pack of cigarettes a day for 1 year.  Yearly screening should continue until it has been 15 years since you quit.  Yearly screening should stop if you develop a health problem that would prevent you from having lung cancer treatment.  Breast Cancer  Practice breast self-awareness. This means understanding how your breasts normally appear and feel.  It also means doing regular breast self-exams. Let your health care provider know about any changes, no matter how small.  If you are in your 20s or 30s, you should have a clinical breast exam (CBE) by a health care provider every 1-3 years as   part of a regular health exam.  If you are 40 or older, have a CBE every year. Also consider having a breast X-ray (mammogram) every year.  If you have a family history of breast cancer, talk to your health care provider about genetic screening.  If you are at high risk for breast cancer, talk to your health care provider about having an MRI and a mammogram every year.  Breast cancer gene (BRCA) assessment is recommended for women who have family members with BRCA-related cancers. BRCA-related cancers  include:  Breast.  Ovarian.  Tubal.  Peritoneal cancers.  Results of the assessment will determine the need for genetic counseling and BRCA1 and BRCA2 testing. Cervical Cancer Your health care provider may recommend that you be screened regularly for cancer of the pelvic organs (ovaries, uterus, and vagina). This screening involves a pelvic examination, including checking for microscopic changes to the surface of your cervix (Pap test). You may be encouraged to have this screening done every 3 years, beginning at age 21.  For women ages 30-65, health care providers may recommend pelvic exams and Pap testing every 3 years, or they may recommend the Pap and pelvic exam, combined with testing for human papilloma virus (HPV), every 5 years. Some types of HPV increase your risk of cervical cancer. Testing for HPV may also be done on women of any age with unclear Pap test results.  Other health care providers may not recommend any screening for nonpregnant women who are considered low risk for pelvic cancer and who do not have symptoms. Ask your health care provider if a screening pelvic exam is right for you.  If you have had past treatment for cervical cancer or a condition that could lead to cancer, you need Pap tests and screening for cancer for at least 20 years after your treatment. If Pap tests have been discontinued, your risk factors (such as having a new sexual partner) need to be reassessed to determine if screening should resume. Some women have medical problems that increase the chance of getting cervical cancer. In these cases, your health care provider may recommend more frequent screening and Pap tests. Colorectal Cancer  This type of cancer can be detected and often prevented.  Routine colorectal cancer screening usually begins at 58 years of age and continues through 58 years of age.  Your health care provider may recommend screening at an earlier age if you have risk factors for  colon cancer.  Your health care provider may also recommend using home test kits to check for hidden blood in the stool.  A small camera at the end of a tube can be used to examine your colon directly (sigmoidoscopy or colonoscopy). This is done to check for the earliest forms of colorectal cancer.  Routine screening usually begins at age 50.  Direct examination of the colon should be repeated every 5-10 years through 58 years of age. However, you may need to be screened more often if early forms of precancerous polyps or small growths are found. Skin Cancer  Check your skin from head to toe regularly.  Tell your health care provider about any new moles or changes in moles, especially if there is a change in a mole's shape or color.  Also tell your health care provider if you have a mole that is larger than the size of a pencil eraser.  Always use sunscreen. Apply sunscreen liberally and repeatedly throughout the day.  Protect yourself by wearing long sleeves, pants,   a wide-brimmed hat, and sunglasses whenever you are outside. HEART DISEASE, DIABETES, AND HIGH BLOOD PRESSURE   High blood pressure causes heart disease and increases the risk of stroke. High blood pressure is more likely to develop in:  People who have blood pressure in the high end of the normal range (130-139/85-89 mm Hg).  People who are overweight or obese.  People who are African American.  If you are 40-86 years of age, have your blood pressure checked every 3-5 years. If you are 15 years of age or older, have your blood pressure checked every year. You should have your blood pressure measured twice--once when you are at a hospital or clinic, and once when you are not at a hospital or clinic. Record the average of the two measurements. To check your blood pressure when you are not at a hospital or clinic, you can use:  An automated blood pressure machine at a pharmacy.  A home blood pressure monitor.  If you  are between 2 years and 74 years old, ask your health care provider if you should take aspirin to prevent strokes.  Have regular diabetes screenings. This involves taking a blood sample to check your fasting blood sugar level.  If you are at a normal weight and have a low risk for diabetes, have this test once every three years after 58 years of age.  If you are overweight and have a high risk for diabetes, consider being tested at a younger age or more often. PREVENTING INFECTION  Hepatitis B  If you have a higher risk for hepatitis B, you should be screened for this virus. You are considered at high risk for hepatitis B if:  You were born in a country where hepatitis B is common. Ask your health care provider which countries are considered high risk.  Your parents were born in a high-risk country, and you have not been immunized against hepatitis B (hepatitis B vaccine).  You have HIV or AIDS.  You use needles to inject street drugs.  You live with someone who has hepatitis B.  You have had sex with someone who has hepatitis B.  You get hemodialysis treatment.  You take certain medicines for conditions, including cancer, organ transplantation, and autoimmune conditions. Hepatitis C  Blood testing is recommended for:  Everyone born from [birth year] through 1963.  Anyone with known risk factors for hepatitis C. Sexually transmitted infections (STIs)  You should be screened for sexually transmitted infections (STIs) including gonorrhea and chlamydia if:  You are sexually active and are younger than 58 years of age.  You are older than 58 years of age and your health care provider tells you that you are at risk for this type of infection.  Your sexual activity has changed since you were last screened and you are at an increased risk for chlamydia or gonorrhea. Ask your health care provider if you are at risk.  If you do not have HIV, but are at risk, it may be recommended that you  take a prescription medicine daily to prevent HIV infection. This is called pre-exposure prophylaxis (PrEP). You are considered at risk if:  You are sexually active and do not regularly use condoms or know the HIV status of your partner(s).  You take drugs by injection.  You are sexually active with a partner who has HIV. Talk with your health care provider about whether you are at high risk of being infected with HIV. If you choose to begin  PrEP, you should first be tested for HIV. You should then be tested every 3 months for as long as you are taking PrEP.  PREGNANCY   If you are premenopausal and you may become pregnant, ask your health care provider about preconception counseling.  If you may become pregnant, take 400 to 800 micrograms (mcg) of folic acid every day.  If you want to prevent pregnancy, talk to your health care provider about birth control (contraception). OSTEOPOROSIS AND MENOPAUSE   Osteoporosis is a disease in which the bones lose minerals and strength with aging. This can result in serious bone fractures. Your risk for osteoporosis can be identified using a bone density scan.  If you are 60 years of age or older, or if you are at risk for osteoporosis and fractures, ask your health care provider if you should be screened.  Ask your health care provider whether you should take a calcium or vitamin D supplement to lower your risk for osteoporosis.  Menopause may have certain physical symptoms and risks.  Hormone replacement therapy may reduce some of these symptoms and risks. Talk to your health care provider about whether hormone replacement therapy is right for you.  HOME CARE INSTRUCTIONS   Schedule regular health, dental, and eye exams.  Stay current with your immunizations.   Do not use any tobacco products including cigarettes, chewing tobacco, or electronic cigarettes.  If you are pregnant, do not drink alcohol.  If you are breastfeeding, limit how  much and how often you drink alcohol.  Limit alcohol intake to no more than 1 drink per day for nonpregnant women. One drink equals 12 ounces of beer, 5 ounces of wine, or 1 ounces of hard liquor.  Do not use street drugs.  Do not share needles.  Ask your health care provider for help if you need support or information about quitting drugs.  Tell your health care provider if you often feel depressed.  Tell your health care provider if you have ever been abused or do not feel safe at home.   This information is not intended to replace advice given to you by your health care provider. Make sure you discuss any questions you have with your health care provider.   Document Released: 11/01/2010 Document Revised: 05/09/2014 Document Reviewed: 03/20/2013 Elsevier Interactive Patient Education [employer].

## 2015-10-14 NOTE — Assessment & Plan Note (Signed)
Intermittent isolated vomiting episodes that last 12-24 hours and resolve on their own, not associated with significant nausea or other GI symptoms, but associated with endorsed 12lb weight loss over last 3 months. Recent LFTs and CBC WNL. H/o liver abscess.  Check renal panel, lipase. Check abd [place], consider abd CT. Refer to GI for consideration of EGD/further evaluation. Pt agrees with plan.Marland Kitchen

## 2015-10-14 NOTE — Assessment & Plan Note (Signed)
Discussed healthy diet and lifestyle changes to affect sustainable weight loss  

## 2015-10-14 NOTE — Progress Notes (Signed)
BP 128/84 mmHg  Pulse 76  Temp(Src) 98.1 F (36.7 C) (Oral)  Ht 5\' 2"  (1.575 m)  Wt 272 lb 4 oz (123.492 kg)  BMI 49.78 kg/m2   CC: CPE  Subjective:    Patient ID: Leah Peterson, female    DOB: 1955/10/18, 60 y.o.   MRN: [id number]  HPI: Leah Peterson is a 60 y.o. female presenting on 10/14/2015 for Annual Exam and GI Problem   Intermittent episodes of vomiting over last 4 months, NBNB. Food emesis. These episodes occur randomly in the morning, not food related. Daily probiotic and keefer hasn't helped. No abd pain, no significant nausea with this, diarrhea or constipation or blood in stool. Normal stools, normal flatus. No dysphagia or early satiety. Recently found to be in ARF - labs were right after vomiting episode so she may have been dehydrated. Denies GERD sxs, reflux, cough. She has lost 12 lbs in last 6 months without trying.   Preventative: COLONOSCOPY WITH PROPOFOL Laterality: N/A Date: 06/30/2015 diverticulosis, rpt 10 yrs Gatha Mayer, MD) Mammogram 10/2014 WNL, scheduled for this friday Pap smear - always normal last 2016. No irregular bleeding. LMP 10/2007. No post menopausal sxs.  Flu shot yearly Tetanus 2010.  Pneumovax 07/2013 Seat belt use discussed No suspicious moles on skin.  Caffeine: 2-3 cups coffee, 2 cups sweet tea  Lives with daughter and grand daughter. No pets.  Divorced; 1 daughter Occupation: Administrator at [employer]  Activity: cares for granddaughter. No regular exercise.  Diet: good water, fruits/vegetables daily, drinks a lot of sweet tea.   Relevant past medical, surgical, family and social history reviewed and updated as indicated. Interim medical history since our last visit reviewed. Allergies and medications reviewed and updated. Current Outpatient Prescriptions on File Prior to Visit  Medication Sig  . amLODipine (NORVASC) 5 MG tablet Take 1 tablet (5 mg total) by mouth daily.  . cetirizine (ZYRTEC) 10 MG tablet Take 10 mg by  mouth at bedtime as needed for allergies.   . hydrochlorothiazide (HYDRODIURIL) 25 MG tablet Take 1 tablet (25 mg total) by mouth daily.  . Multiple Vitamin (MULTIVITAMIN WITH MINERALS) TABS tablet Take 1 tablet by mouth daily.  Marland Kitchen dicyclomine (BENTYL) 10 MG capsule Take 10 mg by mouth 4 (four) times daily as needed for spasms. Reported on 10/14/2015   No current facility-administered medications on file prior to visit.    Review of Systems  Constitutional: Positive for unexpected weight change (12 lb weight loss). Negative for fever, chills, activity change, appetite change and fatigue.  HENT: Negative for hearing loss.   Eyes: Negative for visual disturbance.  Respiratory: Negative for cough, chest tightness, shortness of breath and wheezing.   Cardiovascular: Negative for chest pain, palpitations and leg swelling.  Gastrointestinal: Positive for vomiting. Negative for nausea, abdominal pain, diarrhea, constipation, blood in stool and abdominal distention.  Genitourinary: Negative for hematuria and difficulty urinating.  Musculoskeletal: Negative for myalgias, arthralgias and neck pain.  Skin: Negative for rash.  Neurological: Negative for dizziness, seizures, syncope and headaches.  Hematological: Negative for adenopathy. Does not bruise/bleed easily.  Psychiatric/Behavioral: Negative for dysphoric mood. The patient is not nervous/anxious.    Per HPI unless specifically indicated in ROS section     Objective:    BP 128/84 mmHg  Pulse 76  Temp(Src) 98.1 F (36.7 C) (Oral)  Ht 5\' 2"  (1.575 m)  Wt 272 lb 4 oz (123.492 kg)  BMI 49.78 kg/m2  Wt Readings from Last 3 Encounters:  10/14/15 272 lb 4 oz (123.492 kg)  06/30/15 286 lb (129.729 kg)  04/10/15 286 lb 8 oz (129.956 kg)   Body mass index is 49.78 kg/(m^2).  Physical Exam  Constitutional: She is oriented to person, place, and time. She appears well-developed and well-nourished. No distress.  HENT:  Head: Normocephalic and  atraumatic.  Right Ear: Hearing, tympanic membrane, external ear and ear canal normal.  Left Ear: Hearing, tympanic membrane, external ear and ear canal normal.  Nose: Nose normal.  Mouth/Throat: Uvula is midline, oropharynx is clear and moist and mucous membranes are normal. No oropharyngeal exudate, posterior oropharyngeal edema or posterior oropharyngeal erythema.  Eyes: Conjunctivae and EOM are normal. Pupils are equal, round, and reactive to light. No scleral icterus.  Neck: Normal range of motion. Neck supple. No thyromegaly present.  Cardiovascular: Normal rate, regular rhythm, normal heart sounds and intact distal pulses.   No murmur heard. Pulses:      Radial pulses are 2+ on the right side, and 2+ on the left side.  Pulmonary/Chest: Effort normal and breath sounds normal. No respiratory distress. She has no wheezes. She has no rales. Right breast exhibits no inverted nipple, no mass, no nipple discharge, no skin change and no tenderness. Left breast exhibits no inverted nipple, no mass, no nipple discharge, no skin change and no tenderness.  Abdominal: Soft. Bowel sounds are normal. She exhibits no distension and no mass. There is no tenderness. There is no rebound and no guarding.  Musculoskeletal: Normal range of motion. She exhibits no edema.  Lymphadenopathy:       Head (right side): No submental, no submandibular, no tonsillar, no preauricular and no posterior auricular adenopathy present.       Head (left side): No submental, no submandibular, no tonsillar, no preauricular and no posterior auricular adenopathy present.    She has no cervical adenopathy.    She has no axillary adenopathy.       Right axillary: No lateral adenopathy present.       Left axillary: No lateral adenopathy present.      Right: No supraclavicular adenopathy present.       Left: No supraclavicular adenopathy present.  Neurological: She is alert and oriented to person, place, and time.  CN grossly  intact, station and gait intact  Skin: Skin is warm and dry. No rash noted.  Psychiatric: She has a normal mood and affect. Her behavior is normal. Judgment and thought content normal.  Nursing note and vitals reviewed.  Results for orders placed or performed in visit on 10/07/15  Lipid panel  Result Value Ref Range   Cholesterol 211 (H) 0 - 200 mg/dL   Triglycerides 91.0 0.0 - 149.0 mg/dL   HDL 50.70 >39.00 mg/dL   VLDL 18.2 0.0 - 40.0 mg/dL   LDL Cholesterol 142 (H) 0 - 99 mg/dL   Total CHOL/HDL Ratio 4    NonHDL 160.13   Comprehensive metabolic panel  Result Value Ref Range   Sodium 137 135 - 145 mEq/L   Potassium 3.5 3.5 - 5.1 mEq/L   Chloride 99 96 - 112 mEq/L   CO2 28 19 - 32 mEq/L   Glucose, Bld 116 (H) 70 - 99 mg/dL   BUN 27 (H) 6 - 23 mg/dL   Creatinine, Ser 1.83 (H) 0.40 - 1.20 mg/dL   Total Bilirubin 0.6 0.2 - 1.2 mg/dL   Alkaline Phosphatase 56 39 - 117 U/L   AST 12 0 - 37 U/L   ALT  10 0 - 35 U/L   Total Protein 8.1 6.0 - 8.3 g/dL   Albumin 4.0 3.5 - 5.2 g/dL   Calcium 9.6 8.4 - 10.5 mg/dL   GFR 36.51 (L) >60.00 mL/min  Hemoglobin A1c  Result Value Ref Range   Hgb A1c MFr Bld 6.0 4.6 - 6.5 %  CBC with Differential/Platelet  Result Value Ref Range   WBC 6.7 4.0 - 10.5 K/uL   RBC 5.00 3.87 - 5.11 Mil/uL   Hemoglobin 14.0 12.0 - 15.0 g/dL   HCT 41.9 36.0 - 46.0 %   MCV 83.9 78.0 - 100.0 fl   MCHC 33.3 30.0 - 36.0 g/dL   RDW 14.2 11.5 - 15.5 %   Platelets 264.0 150.0 - 400.0 K/uL   Neutrophils Relative % 46.8 43.0 - 77.0 %   Lymphocytes Relative 45.4 12.0 - 46.0 %   Monocytes Relative 6.9 3.0 - 12.0 %   Eosinophils Relative 0.4 0.0 - 5.0 %   Basophils Relative 0.5 0.0 - 3.0 %   Neutro Abs 3.1 1.4 - 7.7 K/uL   Lymphs Abs 3.0 0.7 - 4.0 K/uL   Monocytes Absolute 0.5 0.1 - 1.0 K/uL   Eosinophils Absolute 0.0 0.0 - 0.7 K/uL   Basophils Absolute 0.0 0.0 - 0.1 K/uL  VITAMIN D 25 Hydroxy (Vit-D Deficiency, Fractures)  Result Value Ref Range   VITD 22.68 (L)  30.00 - 100.00 ng/mL  Microalbumin / creatinine urine ratio  Result Value Ref Range   Microalb, Ur 4.5 (H) 0.0 - 1.9 mg/dL   Creatinine,U 577.1 mg/dL   Microalb Creat Ratio 0.8 0.0 - 30.0 mg/g      Assessment & Plan:   Problem List Items Addressed This Visit    Vitamin D deficiency    rec restart vit D 1000 IU daily.       HLD (hyperlipidemia)    Chronic. Deterioration reviewed with patient.       Morbid obesity with BMI of 50.0-59.9, adult ([place])    Discussed healthy diet and lifestyle changes to affect sustainable weight loss.      HYPERTENSION, BENIGN ESSENTIAL    Chronic, stable. Continue current regimen.      Prediabetes    Chronic, discussed avoiding added sugars.      Healthcare maintenance - Primary    Preventative protocols reviewed and updated unless pt declined. Discussed healthy diet and lifestyle.       Bilateral leg paresthesia   Relevant Orders   Vitamin B12   AKI (acute kidney injury) ([place])    New - ?related to vomiting episodes. Doubt med related. Recheck renal panel today along with abd [place] - see below.       Relevant Orders   Renal function panel   US Abdomen Complete   Vomiting    Intermittent isolated vomiting episodes that last 12-24 hours and resolve on their own, not associated with significant nausea or other GI symptoms, but associated with endorsed 12lb weight loss over last 3 months. Recent LFTs and CBC WNL. H/o liver abscess.  Check renal panel, lipase. Check abd [place], consider abd CT. Refer to GI for consideration of EGD/further evaluation. Pt agrees with plan..      Relevant Orders   US Abdomen Complete   Ambulatory referral to Gastroenterology   Lipase    Other Visit Diagnoses    Loss of weight        Relevant Orders    US Abdomen Complete  Follow up plan: Return in about 6 months (around 04/14/2016), or as needed, for follow up visit.  Ria Bush, MD

## 2015-10-14 NOTE — Progress Notes (Signed)
Pre visit review using our clinic review tool, if applicable. No additional management support is needed unless otherwise documented below in the visit note. 

## 2015-10-14 NOTE — Addendum Note (Signed)
Addended by: Daralene Milch C on: 10/14/2015 04:05 PM   Modules accepted: Miquel Dunn

## 2015-10-14 NOTE — Assessment & Plan Note (Signed)
Preventative protocols reviewed and updated unless pt declined. Discussed healthy diet and lifestyle.  

## 2015-10-14 NOTE — Assessment & Plan Note (Signed)
Chronic. Deterioration reviewed with patient.

## 2015-10-16 ENCOUNTER — Ambulatory Visit
Admission: RE | Admit: 2015-10-16 | Discharge: 2015-10-16 | Disposition: A | Discharge status: Home / Self Care | Payer: BC Managed Care – PPO | Source: Ambulatory Visit

## 2015-10-16 DIAGNOSIS — Z1231 Encounter for screening mammogram for malignant neoplasm of breast: Secondary | ICD-10-CM

## 2015-10-16 LAB — HM MAMMOGRAPHY

## 2015-10-19 ENCOUNTER — Encounter: Payer: Self-pay | Admitting: *Deleted

## 2015-10-23 ENCOUNTER — Ambulatory Visit
Admission: RE | Admit: 2015-10-23 | Discharge: 2015-10-23 | Disposition: A | Discharge status: Home / Self Care | Payer: BC Managed Care – PPO | Source: Ambulatory Visit | Attending: Family Medicine | Admitting: Family Medicine

## 2015-10-23 DIAGNOSIS — N179 Acute kidney failure, unspecified: Secondary | ICD-10-CM

## 2015-10-23 DIAGNOSIS — R634 Abnormal weight loss: Secondary | ICD-10-CM

## 2015-10-23 DIAGNOSIS — R1111 Vomiting without nausea: Secondary | ICD-10-CM

## 2015-10-29 ENCOUNTER — Other Ambulatory Visit: Payer: Self-pay | Admitting: Family Medicine

## 2015-10-29 DIAGNOSIS — K8020 Calculus of gallbladder without cholecystitis without obstruction: Secondary | ICD-10-CM

## 2015-10-29 DIAGNOSIS — R1111 Vomiting without nausea: Secondary | ICD-10-CM

## 2015-11-17 ENCOUNTER — Other Ambulatory Visit: Payer: Self-pay | Admitting: Family Medicine

## 2015-12-23 ENCOUNTER — Encounter: Payer: Self-pay | Admitting: Internal Medicine

## 2015-12-23 ENCOUNTER — Ambulatory Visit (INDEPENDENT_AMBULATORY_CARE_PROVIDER_SITE_OTHER): Payer: BC Managed Care – PPO | Admitting: Internal Medicine

## 2015-12-23 VITALS — BP 128/100 | HR 76 | Ht 61.25 in | Wt 275.4 lb

## 2015-12-23 DIAGNOSIS — R1110 Vomiting, unspecified: Secondary | ICD-10-CM

## 2015-12-23 DIAGNOSIS — R634 Abnormal weight loss: Secondary | ICD-10-CM | POA: Diagnosis not present

## 2015-12-23 DIAGNOSIS — K8020 Calculus of gallbladder without cholecystitis without obstruction: Secondary | ICD-10-CM | POA: Diagnosis not present

## 2015-12-23 DIAGNOSIS — K589 Irritable bowel syndrome without diarrhea: Secondary | ICD-10-CM

## 2015-12-23 DIAGNOSIS — R1115 Cyclical vomiting syndrome unrelated to migraine: Secondary | ICD-10-CM

## 2015-12-23 MED ORDER — ONDANSETRON HCL 4 MG PO TABS: 4.0000 mg | ORAL_TABLET | Freq: Three times a day (TID) | ORAL | 1 refills | Status: DC | PRN

## 2015-12-23 NOTE — Patient Instructions (Addendum)
   We have sent the following medications to your pharmacy for you to pick up at your convenience: Generic zofran   You have been scheduled for an endoscopy. Please follow written instructions given to you at your visit today.  I'll call you with the date/time to put in the blanks. If you use inhalers (even only as needed), please bring them with you on the day of your procedure.  Spoke with Lashunda on 12/24/15 and informed her that EGD is going to be 12/30/15 at 2:00pm at [facility], arrive at 12:30pm.  We went over the directions again today on the phone.   I appreciate the opportunity to care for you. Silvano Rusk, MD, [facility]

## 2015-12-23 NOTE — Progress Notes (Signed)
Referred by: Ria Bush, MD [place], [place] [address]  Subjective:    Patient ID: Leah Peterson, female    DOB: 02/19/1956, 60 y.o.   MRN: [id number] Cc: nausea and vomiting  HPI Here with weeks-mos of intermittent post-prandial nausea and voniting. Not much if any pain. [place] has shown gallstones. She does not have sig pc epigastric or RUQ pain. LFT's NL Cannot correlate w/ any specific food. Colonoscopy 2017 - diverticulosis Has IBS and some alternating bowel habits.  No medications OTC or RX for sxs  Wt Readings from Last 3 Encounters:  12/23/15 275 lb 6 oz (124.9 kg)  10/14/15 272 lb 4 oz (123.5 kg)  06/30/15 286 lb (129.7 kg)    Allergies  Allergen Reactions  . Iodinated Diagnostic Agents Itching  . Lisinopril     REACTION: lethargy, cough.  . Penicillins Other (See Comments)    LYMPH NODES SWELL Has patient had a PCN reaction causing immediate rash, facial/tongue/throat swelling, SOB or lightheadedness with hypotension: No Has patient had a PCN reaction causing severe rash involving mucus membranes or skin necrosis: No Has patient had a PCN reaction that required hospitalization No Has patient had a PCN reaction occurring within the last 10 years: No If all of the above answers are "NO", then may proceed with Cephalosporin use.    Outpatient Medications Prior to Visit  Medication Sig Dispense Refill  . amLODipine (NORVASC) 5 MG tablet TAKE 1 TABLET BY MOUTH EVERY DAY 90 tablet 3  . cetirizine (ZYRTEC) 10 MG tablet Take 10 mg by mouth at bedtime as needed for allergies.     . cholecalciferol (VITAMIN D) 1000 units tablet Take 1,000 Units by mouth daily.    Marland Kitchen dicyclomine (BENTYL) 10 MG capsule Take 10 mg by mouth 4 (four) times daily as needed for spasms. Reported on 10/14/2015    . hydrochlorothiazide (HYDRODIURIL) 25 MG tablet Take 1 tablet (25 mg total) by mouth daily. 90 tablet 2  . Multiple Vitamin (MULTIVITAMIN WITH MINERALS) TABS  tablet Take 1 tablet by mouth daily.     No facility-administered medications prior to visit.    Past Medical History:  Diagnosis Date  . Diverticulosis of colon (without mention of hemorrhage)   . DVT (deep venous thrombosis) ([place]) 2015   bilateral posterior tibial veins treated with IVC filter  . Essential hypertension, benign    Blood pressure tends to elevate with MD visits.  . Gallstones   . History of diverticulitis   . IBS (irritable bowel syndrome)   . Left knee pain 2012   eval by ortho - bone spurs and DJD, treated with steroid shot which helped temporarily  . Liver abscess 2015   hospitalization for severe septic shock after diverticulitis  . Obesity, unspecified   . Other abnormal glucose   . Pure hypercholesterolemia   . Transfusion history    '15-"diverticulosis"  . Unspecified vitamin D deficiency    resolved   Past Surgical History:  Procedure Laterality Date  . CESAREAN SECTION     due to BP elevation  . COLONOSCOPY  2000   Diverticuli-Dr. Sharlett Iles  . COLONOSCOPY WITH PROPOFOL N/A 06/30/2015   diverticulosis, rpt 10 yrs Gatha Mayer, MD)  . drain for liver abcess     Social History   Social History  . Marital status: Divorced    Spouse name: N/A  . Number of children: 1  . Years of education: N/A   Occupational History  .  Analyst; [employer]    Social History Main Topics  . Smoking status: Former Smoker    Years: 3.00    Quit date: 06/15/1977  . Smokeless tobacco: Never Used  . Alcohol use Yes     Comment: wine on occasion   . Drug use: No  . Sexual activity: No     Comment: By choice   Other Topics Concern  . None   Social History Narrative   Caffeine: 2-3 cups coffee, 2 cups sweet tea   Lives with daughter and grand daughter.  No pets.   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   Activity: granddaughter.  No regular exercise.   Diet: fruits/vegetables daily, drinks a lot of  sweet tea   Family History  Problem Relation Age of Onset  . Hypertension Father   . Cancer Father     prostate  . Heart disease Father     CABG x 2  . Cancer Mother     Lung-quit smoking 20 years prior to Dx  . Hypertension Mother   . Drug abuse Brother   . Diabetes      + family hx  . Stroke Maternal Grandmother   . Sleep apnea Brother     with [employer] estranged  . Colon cancer Neg Hx        Review of Systems Hx some anxiety Has knee pain    Objective:   Physical Exam BP (!) 128/100 (BP Location: Left Wrist, Patient Position: Sitting, Cuff Size: Normal)   Pulse 76   Ht 5' 1.25" (1.556 m) Comment: height measured without shoes  Wt 275 lb 6 oz (124.9 kg)   BMI 51.61 kg/m  Obese looks tired, moves slow due to knee pain Anicteric Lungs cta Cor s1s2 abd obese soft and nt - sq thickening epigastrium - pror drain site Alert and oriented x 3 Flat affect No cyanosis or clubbing in ext  Reviewed pcp notes Labs [place]    Assessment & Plan:   Encounter Diagnoses  Name Primary?  . Persistent vomiting Yes  . Loss of weight   . Gallstone   . IBS (irritable bowel syndrome)    EGD to evaluate - hospital due to obesity Zofran prn  The risks and benefits as well as alternatives of endoscopic procedure(s) have been discussed and reviewed. All questions answered. The patient agrees to proceed.  If EGD negative - ? Next step ? Atypical gallstone sxs? I appreciate the opportunity to care for this patient. [id number] Danise Mina, MD

## 2015-12-29 ENCOUNTER — Encounter (HOSPITAL_COMMUNITY): Payer: Self-pay | Admitting: *Deleted

## 2015-12-29 NOTE — Progress Notes (Signed)
Atempted to contact pt at home phone # [phone] however,  " voice mailbox is full and there is not enough space to leave a message."

## 2015-12-29 NOTE — Progress Notes (Signed)
When instructed to be NPO after midnight pt stated that she could drink clears up to 4 hours prior to procedure.

## 2015-12-30 ENCOUNTER — Ambulatory Visit (HOSPITAL_COMMUNITY): Payer: BC Managed Care – PPO | Admitting: Certified Registered Nurse Anesthetist

## 2015-12-30 ENCOUNTER — Encounter (HOSPITAL_COMMUNITY): Payer: Self-pay | Admitting: *Deleted

## 2015-12-30 ENCOUNTER — Encounter (HOSPITAL_COMMUNITY)
Admission: RE | Disposition: A | Discharge status: Home / Self Care | Payer: Self-pay | Source: Ambulatory Visit | Attending: Internal Medicine

## 2015-12-30 ENCOUNTER — Ambulatory Visit (HOSPITAL_COMMUNITY)
Admission: RE | Admit: 2015-12-30 | Discharge: 2015-12-30 | Disposition: A | Discharge status: Home / Self Care | Payer: BC Managed Care – PPO | Source: Ambulatory Visit | Attending: Internal Medicine | Admitting: Internal Medicine

## 2015-12-30 DIAGNOSIS — R1110 Vomiting, unspecified: Secondary | ICD-10-CM

## 2015-12-30 DIAGNOSIS — R112 Nausea with vomiting, unspecified: Secondary | ICD-10-CM | POA: Diagnosis not present

## 2015-12-30 DIAGNOSIS — E559 Vitamin D deficiency, unspecified: Secondary | ICD-10-CM | POA: Diagnosis not present

## 2015-12-30 DIAGNOSIS — I10 Essential (primary) hypertension: Secondary | ICD-10-CM | POA: Diagnosis not present

## 2015-12-30 DIAGNOSIS — R634 Abnormal weight loss: Secondary | ICD-10-CM | POA: Diagnosis not present

## 2015-12-30 DIAGNOSIS — R12 Heartburn: Secondary | ICD-10-CM

## 2015-12-30 DIAGNOSIS — R1115 Cyclical vomiting syndrome unrelated to migraine: Secondary | ICD-10-CM

## 2015-12-30 DIAGNOSIS — Z6841 Body Mass Index (BMI) 40.0 and over, adult: Secondary | ICD-10-CM | POA: Diagnosis not present

## 2015-12-30 DIAGNOSIS — Z79899 Other long term (current) drug therapy: Secondary | ICD-10-CM | POA: Diagnosis not present

## 2015-12-30 DIAGNOSIS — K589 Irritable bowel syndrome without diarrhea: Secondary | ICD-10-CM | POA: Insufficient documentation

## 2015-12-30 DIAGNOSIS — Z87891 Personal history of nicotine dependence: Secondary | ICD-10-CM | POA: Diagnosis not present

## 2015-12-30 HISTORY — PX: ESOPHAGOGASTRODUODENOSCOPY (EGD) WITH PROPOFOL: SHX5813

## 2015-12-30 SURGERY — ESOPHAGOGASTRODUODENOSCOPY (EGD) WITH PROPOFOL
Anesthesia: Monitor Anesthesia Care

## 2015-12-30 MED ORDER — SODIUM CHLORIDE 0.9 % IV SOLN
INTRAVENOUS | Status: DC
  Administered 2015-12-30: 14:00:00 via INTRAVENOUS

## 2015-12-30 MED ORDER — PROPOFOL 10 MG/ML IV BOLUS
INTRAVENOUS | Status: DC | PRN
  Administered 2015-12-30: 90 mg via INTRAVENOUS

## 2015-12-30 MED ORDER — BUTAMBEN-TETRACAINE-BENZOCAINE 2-2-14 % EX AERO
INHALATION_SPRAY | CUTANEOUS | Status: DC | PRN
  Administered 2015-12-30: 1 via TOPICAL

## 2015-12-30 MED ORDER — PROPOFOL 500 MG/50ML IV EMUL
INTRAVENOUS | Status: DC | PRN
  Administered 2015-12-30: 125 ug/kg/min via INTRAVENOUS

## 2015-12-30 NOTE — Transfer of Care (Signed)
Immediate Anesthesia Transfer of Care Note  Patient: Leah Peterson  Procedure(s) Performed: Procedure(s): ESOPHAGOGASTRODUODENOSCOPY (EGD) WITH PROPOFOL (N/A)  Patient Location: PACU and Endoscopy Unit  Anesthesia Type:MAC  Level of Consciousness: awake, alert , oriented and patient cooperative  Airway & Oxygen Therapy: Patient Spontanous Breathing and Patient connected to nasal cannula oxygen  Post-op Assessment: Report given to RN and Post -op Vital signs reviewed and stable  Post vital signs: Reviewed and stable  Last Vitals:  Vitals:   12/30/15 1253 12/30/15 1415  BP: (!) 158/81 111/71  Pulse: 63 69  Resp: 19 12  Temp: 36.5 C     Last Pain:  Vitals:   12/30/15 1253  TempSrc: Oral         Complications: No apparent anesthesia complications

## 2015-12-30 NOTE — Op Note (Signed)
[facility] Patient Name: Leah Peterson Procedure Date : 12/30/2015 MRN: [id number] Attending MD: Gatha Mayer , MD Date of Birth: 1955-09-13 CSN: [id number] Age: 60 Admit Type: Outpatient Procedure:                Upper GI endoscopy Indications:              Heartburn, Persistent vomiting Providers:                Gatha Mayer, MD, Kingsley Plan, RN, Corliss Parish, Technician Referring MD:              Medicines:                Propofol per Anesthesia, Monitored Anesthesia Care Complications:            No immediate complications. Estimated Blood Loss:     Estimated blood loss: none. Procedure:                Pre-Anesthesia Assessment:                           - Prior to the procedure, a History and Physical                            was performed, and patient medications and                            allergies were reviewed. The patient's tolerance of                            previous anesthesia was also reviewed. The risks                            and benefits of the procedure and the sedation                            options and risks were discussed with the patient.                            All questions were answered, and informed consent                            was obtained. Prior Anticoagulants: The patient has                            taken no previous anticoagulant or antiplatelet                            agents. ASA Grade Assessment: III - A patient with                            severe systemic disease. After reviewing the risks  and benefits, the patient was deemed in                            satisfactory condition to undergo the procedure.                           After obtaining informed consent, the endoscope was                            passed under direct vision. Throughout the                            procedure, the patient's blood pressure, pulse, and                     oxygen saturations were monitored continuously. The                            [id number]) scope was introduced through the                            mouth, and advanced to the second part of duodenum.                            The upper GI endoscopy was accomplished without                            difficulty. The patient tolerated the procedure                            well. Scope In: Scope Out: Findings:      The esophagus was normal.      The stomach was normal.      The examined duodenum was normal. Impression:               - Normal esophagus.                           - Normal stomach.                           - Normal examined duodenum.                           - No specimens collected. Moderate Sedation:      Please see anesthesia notes, moderate sedation not given Recommendation:           - Patient has a contact number available for                            emergencies. The signs and symptoms of potential                            delayed complications were discussed with the                            patient.  Return to normal activities tomorrow.                            Written discharge instructions were provided to the                            patient.                           - Resume previous diet.                           - Continue present medications. Procedure Code(s):        --- Professional ---                           [phone], Esophagogastroduodenoscopy, flexible,                            transoral; diagnostic, including collection of                            specimen(s) by brushing or washing, when performed                            (separate procedure) Diagnosis Code(s):        --- Professional ---                           R12, Heartburn                           R11.10, Vomiting, unspecified CPT copyright 2016 American Medical Association. All rights reserved. The codes documented in this report are  preliminary and upon coder review may  be revised to meet current compliance requirements. Gatha Mayer, MD 12/30/2015 2:13:19 PM This report has been signed electronically. Number of Addenda: 0

## 2015-12-30 NOTE — Discharge Instructions (Addendum)
Esophagogastroduodenoscopy, Care After Refer to this sheet in the next few weeks. These instructions provide you with information about caring for yourself after your procedure. Your health care provider may also give you more specific instructions. Your treatment has been planned according to current medical practices, but problems sometimes occur. Call your health care provider if you have any problems or questions after your procedure. WHAT TO EXPECT AFTER THE PROCEDURE After your procedure, it is typical to feel:  Soreness in your throat.  Pain with swallowing.  Sick to your stomach (nauseous).  Bloated.  Dizzy.  Fatigued. HOME CARE INSTRUCTIONS  Do not eat or drink anything until the numbing medicine (local anesthetic) has worn off and your gag reflex has returned. You will know that the local anesthetic has worn off when you can swallow comfortably.  Do not drive or operate machinery until directed by your health care provider.  Take medicines only as directed by your health care provider. SEEK MEDICAL CARE IF:   You cannot stop coughing.  You are not urinating at all or less than usual. SEEK IMMEDIATE MEDICAL CARE IF:  You have difficulty swallowing.  You cannot eat or drink.  You have worsening throat or chest pain.  You have dizziness or lightheadedness or you faint.  You have nausea or vomiting.  You have chills.  You have a fever.  You have severe abdominal pain.  You have black, tarry, or bloody stools.   This information is not intended to replace advice given to you by your health care provider. Make sure you discuss any questions you have with your health care provider.   Document Released: 04/04/2012 Document Revised: 05/09/2014 Document Reviewed: 04/04/2012 Elsevier Interactive Patient Education [employer].        The esophagus, stomach and duodenum are normal. Nothing to explain vomiting. Since you are much better with the ondansetron  use that as needed and if the vomiting comes back - let me know.  I appreciate the opportunity to care for you. Gatha Mayer, MD, [facility]

## 2015-12-30 NOTE — H&P (View-Only) (Signed)
Referred by: Ria Bush, MD [place], [place] [address]  Subjective:    Patient ID: Leah Peterson, female    DOB: 07-30-1955, 60 y.o.   MRN: [id number] Cc: nausea and vomiting  HPI Here with weeks-mos of intermittent post-prandial nausea and voniting. Not much if any pain. [place] has shown gallstones. She does not have sig pc epigastric or RUQ pain. LFT's NL Cannot correlate w/ any specific food. Colonoscopy 2017 - diverticulosis Has IBS and some alternating bowel habits.  No medications OTC or RX for sxs  Wt Readings from Last 3 Encounters:  12/23/15 275 lb 6 oz (124.9 kg)  10/14/15 272 lb 4 oz (123.5 kg)  06/30/15 286 lb (129.7 kg)    Allergies  Allergen Reactions  . Iodinated Diagnostic Agents Itching  . Lisinopril     REACTION: lethargy, cough.  . Penicillins Other (See Comments)    LYMPH NODES SWELL Has patient had a PCN reaction causing immediate rash, facial/tongue/throat swelling, SOB or lightheadedness with hypotension: No Has patient had a PCN reaction causing severe rash involving mucus membranes or skin necrosis: No Has patient had a PCN reaction that required hospitalization No Has patient had a PCN reaction occurring within the last 10 years: No If all of the above answers are "NO", then may proceed with Cephalosporin use.    Outpatient Medications Prior to Visit  Medication Sig Dispense Refill  . amLODipine (NORVASC) 5 MG tablet TAKE 1 TABLET BY MOUTH EVERY DAY 90 tablet 3  . cetirizine (ZYRTEC) 10 MG tablet Take 10 mg by mouth at bedtime as needed for allergies.     . cholecalciferol (VITAMIN D) 1000 units tablet Take 1,000 Units by mouth daily.    Marland Kitchen dicyclomine (BENTYL) 10 MG capsule Take 10 mg by mouth 4 (four) times daily as needed for spasms. Reported on 10/14/2015    . hydrochlorothiazide (HYDRODIURIL) 25 MG tablet Take 1 tablet (25 mg total) by mouth daily. 90 tablet 2  . Multiple Vitamin (MULTIVITAMIN WITH MINERALS) TABS  tablet Take 1 tablet by mouth daily.     No facility-administered medications prior to visit.    Past Medical History:  Diagnosis Date  . Diverticulosis of colon (without mention of hemorrhage)   . DVT (deep venous thrombosis) ([place]) 2015   bilateral posterior tibial veins treated with IVC filter  . Essential hypertension, benign    Blood pressure tends to elevate with MD visits.  . Gallstones   . History of diverticulitis   . IBS (irritable bowel syndrome)   . Left knee pain 2012   eval by ortho - bone spurs and DJD, treated with steroid shot which helped temporarily  . Liver abscess 2015   hospitalization for severe septic shock after diverticulitis  . Obesity, unspecified   . Other abnormal glucose   . Pure hypercholesterolemia   . Transfusion history    '15-"diverticulosis"  . Unspecified vitamin D deficiency    resolved   Past Surgical History:  Procedure Laterality Date  . CESAREAN SECTION     due to BP elevation  . COLONOSCOPY  2000   Diverticuli-Dr. Sharlett Iles  . COLONOSCOPY WITH PROPOFOL N/A 06/30/2015   diverticulosis, rpt 10 yrs Gatha Mayer, MD)  . drain for liver abcess     Social History   Social History  . Marital status: Divorced    Spouse name: N/A  . Number of children: 1  . Years of education: N/A   Occupational History  .  Analyst; [employer] of [employer]    Social History Main Topics  . Smoking status: Former Smoker    Years: 3.00    Quit date: 06/15/1977  . Smokeless tobacco: Never Used  . Alcohol use Yes     Comment: wine on occasion   . Drug use: No  . Sexual activity: No     Comment: By choice   Other Topics Concern  . None   Social History Narrative   Caffeine: 2-3 cups coffee, 2 cups sweet tea   Lives with daughter and grand daughter.  No pets.   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   Activity: granddaughter.  No regular exercise.   Diet: fruits/vegetables daily, drinks a lot of  sweet tea   Family History  Problem Relation Age of Onset  . Hypertension Father   . Cancer Father     prostate  . Heart disease Father     CABG x 2  . Cancer Mother     Lung-quit smoking 20 years prior to Dx  . Hypertension Mother   . Drug abuse Brother   . Diabetes      + family hx  . Stroke Maternal Grandmother   . Sleep apnea Brother     with [employer] estranged  . Colon cancer Neg Hx        Review of Systems Hx some anxiety Has knee pain    Objective:   Physical Exam BP (!) 128/100 (BP Location: Left Wrist, Patient Position: Sitting, Cuff Size: Normal)   Pulse 76   Ht 5' 1.25" (1.556 m) Comment: height measured without shoes  Wt 275 lb 6 oz (124.9 kg)   BMI 51.61 kg/m  Obese looks tired, moves slow due to knee pain Anicteric Lungs cta Cor s1s2 abd obese soft and nt - sq thickening epigastrium - pror drain site Alert and oriented x 3 Flat affect No cyanosis or clubbing in ext  Reviewed pcp notes Labs [place]    Assessment & Plan:   Encounter Diagnoses  Name Primary?  . Persistent vomiting Yes  . Loss of weight   . Gallstone   . IBS (irritable bowel syndrome)    EGD to evaluate - hospital due to obesity Zofran prn  The risks and benefits as well as alternatives of endoscopic procedure(s) have been discussed and reviewed. All questions answered. The patient agrees to proceed.  If EGD negative - ? Next step ? Atypical gallstone sxs? I appreciate the opportunity to care for this patient. [id number] Danise Mina, MD

## 2015-12-30 NOTE — Interval H&P Note (Signed)
History and Physical Interval Note:  12/30/2015 1:34 PM  Leah Peterson  has presented today for surgery, with the diagnosis of persist vomiting, wt loss  The various methods of treatment have been discussed with the patient and family. After consideration of risks, benefits and other options for treatment, the patient has consented to  Procedure(s): ESOPHAGOGASTRODUODENOSCOPY (EGD) WITH PROPOFOL (N/A) as a surgical intervention .  The patient's history has been reviewed, patient examined, no change in status, stable for surgery.  I have reviewed the patient's chart and labs.  Questions were answered to the patient's satisfaction.     Silvano Rusk

## 2015-12-30 NOTE — Anesthesia Preprocedure Evaluation (Signed)
Anesthesia Evaluation  Patient identified by MRN, date of birth, ID band Patient awake    Reviewed: Allergy & Precautions, NPO status , Patient's Chart, lab work & pertinent test results  History of Anesthesia Complications Negative for: history of anesthetic complications  Airway Mallampati: II  TM Distance: >3 FB Neck ROM: Full    Dental  (+) Dental Advisory Given   Pulmonary former smoker,    breath sounds clear to auscultation       Cardiovascular hypertension, Pt. on medications (-) angina Rhythm:Regular Rate:Normal  '15 ECHO: EF 55-60%, valves OK   Neuro/Psych negative neurological ROS     GI/Hepatic Neg liver ROS, N/v, but none presently   Endo/Other  Morbid obesity  Renal/GU negative Renal ROS     Musculoskeletal   Abdominal (+) + obese,   Peds  Hematology   Anesthesia Other Findings   Reproductive/Obstetrics                             Anesthesia Physical Anesthesia Plan  ASA: III  Anesthesia Plan: MAC   Post-op Pain Management:    Induction: Intravenous  Airway Management Planned: Nasal Cannula  Additional Equipment:   Intra-op Plan:   Post-operative Plan:   Informed Consent: I have reviewed the patients History and Physical, chart, labs and discussed the procedure including the risks, benefits and alternatives for the proposed anesthesia with the patient or authorized representative who has indicated his/her understanding and acceptance.   Dental advisory given  Plan Discussed with: CRNA and Surgeon  Anesthesia Plan Comments: (Plan routine monitors, MAC)        Anesthesia Quick Evaluation

## 2015-12-31 NOTE — Anesthesia Postprocedure Evaluation (Signed)
Anesthesia Post Note  Patient: Leah Peterson  Procedure(s) Performed: Procedure(s) (LRB): ESOPHAGOGASTRODUODENOSCOPY (EGD) WITH PROPOFOL (N/A)  Patient location during evaluation: Endoscopy Anesthesia Type: MAC Level of consciousness: awake and alert, oriented and patient cooperative Pain management: pain level controlled Vital Signs Assessment: post-procedure vital signs reviewed and stable Respiratory status: spontaneous breathing, nonlabored ventilation and respiratory function stable Cardiovascular status: blood pressure returned to baseline and stable Postop Assessment: no signs of nausea or vomiting Anesthetic complications: no Comments: Delayed entry: pt eval in PACU     Last Vitals:  Vitals:   12/30/15 1425 12/30/15 1435  BP: 112/83 (!) 145/99  Pulse: (!) 57 74  Resp: (!) 25 14  Temp:      Last Pain:  Vitals:   12/30/15 1253  TempSrc: Oral   Pain Goal:                 Tracy Kinner,E. Yilia Sacca

## 2016-01-29 ENCOUNTER — Other Ambulatory Visit: Payer: Self-pay | Admitting: Family Medicine

## 2016-01-29 MED ORDER — DICYCLOMINE HCL 10 MG PO CAPS: 10.0000 mg | ORAL_CAPSULE | Freq: Four times a day (QID) | ORAL | 0 refills | Status: DC | PRN

## 2016-01-29 NOTE — Telephone Encounter (Signed)
Ok to refill? Last filled 10/16/13 during hospital stay

## 2016-01-29 NOTE — Telephone Encounter (Signed)
Pt left v/m requesting cb about status ob bentyl refill.

## 2016-01-29 NOTE — Telephone Encounter (Signed)
Patient notified

## 2016-01-29 NOTE — Telephone Encounter (Signed)
Sent in. If no better rec eval.

## 2016-03-11 LAB — HM DIABETES EYE EXAM

## 2016-04-15 ENCOUNTER — Ambulatory Visit: Payer: BC Managed Care – PPO | Admitting: Family Medicine

## 2016-04-18 ENCOUNTER — Ambulatory Visit (INDEPENDENT_AMBULATORY_CARE_PROVIDER_SITE_OTHER): Payer: BC Managed Care – PPO | Admitting: Family Medicine

## 2016-04-18 ENCOUNTER — Encounter: Payer: Self-pay | Admitting: Family Medicine

## 2016-04-18 VITALS — BP 124/94 | HR 68 | Wt 272.1 lb

## 2016-04-18 DIAGNOSIS — K439 Ventral hernia without obstruction or gangrene: Secondary | ICD-10-CM | POA: Diagnosis not present

## 2016-04-18 DIAGNOSIS — I10 Essential (primary) hypertension: Secondary | ICD-10-CM | POA: Diagnosis not present

## 2016-04-18 DIAGNOSIS — M6208 Separation of muscle (nontraumatic), other site: Secondary | ICD-10-CM | POA: Insufficient documentation

## 2016-04-18 NOTE — Patient Instructions (Signed)
Return in 6 months for physical. Start monitoring blood pressure at home - and let me know if consistently >140/90 for possible changes to blood pressure medicine.

## 2016-04-18 NOTE — Assessment & Plan Note (Signed)
Chronic, mildly elevated today - pt did not take today's medications yet. She will take when she gets home. Advised to start monitoring bp at home and notify me if persistently >140/90.

## 2016-04-18 NOTE — Progress Notes (Signed)
BP (!) 124/94 (BP Location: Left Arm, Cuff Size: Large)   Pulse 68   Wt 272 lb 1.9 oz (123.4 kg)   SpO2 98%   BMI 51.00 kg/m    CC: 6 mo f/u visit Subjective:    Patient ID: Leah Peterson, female    DOB: [birth year], 60 y.o.   MRN: [id number]  HPI: Leah Peterson is a 60 y.o. female presenting on 04/18/2016 for Follow-up ("liver lump" )   HTN - Compliant with current antihypertensive regimen of amlodipine 5mg  daily, hctz 25mg  daily. Does not check blood pressures at home. No low blood pressure symptoms of dizziness/syncope. Denies HA, vision changes, CP/tightness, SOB, leg swelling.    Notes bulge in mid abdomen that is uncomfortable after meals.   Relevant past medical, surgical, family and social history reviewed and updated as indicated. Interim medical history since our last visit reviewed. Allergies and medications reviewed and updated. Current Outpatient Prescriptions on File Prior to Visit  Medication Sig  . amLODipine (NORVASC) 5 MG tablet TAKE 1 TABLET BY MOUTH EVERY DAY  . cetirizine (ZYRTEC) 10 MG tablet Take 10 mg by mouth at bedtime as needed for allergies.   . cholecalciferol (VITAMIN D) 1000 units tablet Take 1,000 Units by mouth daily.  Marland Kitchen dicyclomine (BENTYL) 10 MG capsule Take 1 capsule (10 mg total) by mouth 4 (four) times daily as needed for spasms.  . hydrochlorothiazide (HYDRODIURIL) 25 MG tablet Take 1 tablet (25 mg total) by mouth daily.  . Multiple Vitamin (MULTIVITAMIN WITH MINERALS) TABS tablet Take 1 tablet by mouth daily.  . ondansetron (ZOFRAN) 4 MG tablet Take 1 tablet (4 mg total) by mouth every 8 (eight) hours as needed for nausea or vomiting.   No current facility-administered medications on file prior to visit.     Review of Systems Per HPI unless specifically indicated in ROS section     Objective:    BP (!) 124/94 (BP Location: Left Arm, Cuff Size: Large)   Pulse 68   Wt 272 lb 1.9 oz (123.4 kg)   SpO2 98%   BMI 51.00 kg/m     Wt Readings from Last 3 Encounters:  04/18/16 272 lb 1.9 oz (123.4 kg)  12/23/15 275 lb 6 oz (124.9 kg)  10/14/15 272 lb 4 oz (123.5 kg)    Physical Exam  Constitutional: She appears well-developed and well-nourished. No distress.  HENT:  Mouth/Throat: Oropharynx is clear and moist. No oropharyngeal exudate.  Cardiovascular: Normal rate, regular rhythm, normal heart sounds and intact distal pulses.   No murmur heard. Pulmonary/Chest: Effort normal and breath sounds normal. No respiratory distress. She has no wheezes. She has no rales.  Abdominal: Soft. Bowel sounds are normal. She exhibits no distension and no mass. There is no tenderness. There is no rebound and no guarding. A hernia (ventral hernia present) is present.  Musculoskeletal: She exhibits no edema.  Skin: Skin is warm and dry. No rash noted.  Psychiatric: She has a normal mood and affect.  Vitals reviewed.  Results for orders placed or performed in visit on 04/18/16  HM DIABETES EYE EXAM  Result Value Ref Range   HM Diabetic Eye Exam No Retinopathy No Retinopathy      Assessment & Plan:   Problem List Items Addressed This Visit    HYPERTENSION, BENIGN ESSENTIAL - Primary    Chronic, mildly elevated today - pt did not take today's medications yet. She will take when she gets home. Advised to start monitoring  bp at home and notify me if persistently >140/90.       Ventral hernia    Discussed etiology - will continue to monitor for now.          Follow up plan: Return in about 6 months (around 10/17/2016) for annual exam, prior fasting for blood work.  Ria Bush, MD

## 2016-04-18 NOTE — Assessment & Plan Note (Signed)
Discussed etiology - will continue to monitor for now.

## 2016-05-09 ENCOUNTER — Other Ambulatory Visit: Payer: Self-pay | Admitting: Family Medicine

## 2016-09-01 ENCOUNTER — Other Ambulatory Visit: Payer: Self-pay | Admitting: Family Medicine

## 2016-09-01 DIAGNOSIS — Z1231 Encounter for screening mammogram for malignant neoplasm of breast: Secondary | ICD-10-CM

## 2016-09-30 DIAGNOSIS — H409 Unspecified glaucoma: Secondary | ICD-10-CM

## 2016-09-30 HISTORY — DX: Unspecified glaucoma: H40.9

## 2016-10-19 ENCOUNTER — Ambulatory Visit
Admission: RE | Admit: 2016-10-19 | Discharge: 2016-10-19 | Disposition: A | Discharge status: Home / Self Care | Payer: BC Managed Care – PPO | Source: Ambulatory Visit | Attending: Family Medicine | Admitting: Family Medicine

## 2016-10-19 DIAGNOSIS — Z1231 Encounter for screening mammogram for malignant neoplasm of breast: Secondary | ICD-10-CM

## 2016-10-21 ENCOUNTER — Encounter: Payer: Self-pay | Admitting: Family Medicine

## 2016-10-21 LAB — HM MAMMOGRAPHY

## 2016-11-08 ENCOUNTER — Other Ambulatory Visit: Payer: Self-pay | Admitting: Family Medicine

## 2016-11-08 DIAGNOSIS — E785 Hyperlipidemia, unspecified: Secondary | ICD-10-CM

## 2016-11-08 DIAGNOSIS — E559 Vitamin D deficiency, unspecified: Secondary | ICD-10-CM

## 2016-11-08 DIAGNOSIS — R7303 Prediabetes: Secondary | ICD-10-CM

## 2016-11-09 ENCOUNTER — Other Ambulatory Visit (INDEPENDENT_AMBULATORY_CARE_PROVIDER_SITE_OTHER): Payer: BC Managed Care – PPO

## 2016-11-09 DIAGNOSIS — R7303 Prediabetes: Secondary | ICD-10-CM

## 2016-11-09 DIAGNOSIS — E559 Vitamin D deficiency, unspecified: Secondary | ICD-10-CM

## 2016-11-09 DIAGNOSIS — E785 Hyperlipidemia, unspecified: Secondary | ICD-10-CM | POA: Diagnosis not present

## 2016-11-09 LAB — VITAMIN D 25 HYDROXY (VIT D DEFICIENCY, FRACTURES): VITD: 25.59 ng/mL — AB (ref 30.00–100.00)

## 2016-11-09 LAB — BASIC METABOLIC PANEL
BUN: 13 mg/dL (ref 6–23)
CHLORIDE: 102 meq/L (ref 96–112)
CO2: 32 mEq/L (ref 19–32)
CREATININE: 1.1 mg/dL (ref 0.40–1.20)
Calcium: 9.4 mg/dL (ref 8.4–10.5)
GFR: 65.44 mL/min (ref >60.00)
Glucose, Bld: 98 mg/dL (ref 70–99)
POTASSIUM: 3.6 meq/L (ref 3.5–5.1)
Sodium: 141 mEq/L (ref 135–145)

## 2016-11-09 LAB — LIPID PANEL
CHOL/HDL RATIO: 4
Cholesterol: 174 mg/dL (ref 0–200)
HDL: 48 mg/dL (ref >39.00)
LDL Cholesterol: 113 mg/dL — ABNORMAL HIGH (ref 0–99)
NONHDL: 125.51
Triglycerides: 63 mg/dL (ref 0.0–149.0)
VLDL: 12.6 mg/dL (ref 0.0–40.0)

## 2016-11-09 LAB — HEMOGLOBIN A1C: HEMOGLOBIN A1C: 6.1 % (ref 4.6–6.5)

## 2016-11-11 ENCOUNTER — Ambulatory Visit (INDEPENDENT_AMBULATORY_CARE_PROVIDER_SITE_OTHER): Payer: BC Managed Care – PPO | Admitting: Family Medicine

## 2016-11-11 ENCOUNTER — Encounter: Payer: Self-pay | Admitting: Family Medicine

## 2016-11-11 VITALS — BP 120/90 | HR 78 | Temp 98.6°F | Ht 61.25 in | Wt 261.0 lb

## 2016-11-11 DIAGNOSIS — R7303 Prediabetes: Secondary | ICD-10-CM | POA: Diagnosis not present

## 2016-11-11 DIAGNOSIS — E559 Vitamin D deficiency, unspecified: Secondary | ICD-10-CM | POA: Diagnosis not present

## 2016-11-11 DIAGNOSIS — R1110 Vomiting, unspecified: Secondary | ICD-10-CM | POA: Diagnosis not present

## 2016-11-11 DIAGNOSIS — Z0000 Encounter for general adult medical examination without abnormal findings: Secondary | ICD-10-CM

## 2016-11-11 DIAGNOSIS — H409 Unspecified glaucoma: Secondary | ICD-10-CM | POA: Diagnosis not present

## 2016-11-11 DIAGNOSIS — I10 Essential (primary) hypertension: Secondary | ICD-10-CM

## 2016-11-11 DIAGNOSIS — R109 Unspecified abdominal pain: Secondary | ICD-10-CM

## 2016-11-11 DIAGNOSIS — Z6841 Body Mass Index (BMI) 40.0 and over, adult: Secondary | ICD-10-CM

## 2016-11-11 DIAGNOSIS — E785 Hyperlipidemia, unspecified: Secondary | ICD-10-CM | POA: Diagnosis not present

## 2016-11-11 DIAGNOSIS — R1115 Cyclical vomiting syndrome unrelated to migraine: Secondary | ICD-10-CM

## 2016-11-11 LAB — CBC WITH DIFFERENTIAL/PLATELET
BASOS ABS: 0 {cells}/uL (ref 0–200)
Basophils Relative: 0 %
EOS ABS: 45 {cells}/uL (ref 15–500)
Eosinophils Relative: 1 %
HEMATOCRIT: 41.3 % (ref 35.0–45.0)
Hemoglobin: 13.4 g/dL (ref 11.7–15.5)
LYMPHS PCT: 49 %
Lymphs Abs: 2205 cells/uL (ref 850–3900)
MCH: 28.2 pg (ref 27.0–33.0)
MCHC: 32.4 g/dL (ref 32.0–36.0)
MCV: 86.9 fL (ref 80.0–100.0)
MONOS PCT: 7 %
MPV: 9.5 fL (ref 7.5–12.5)
Monocytes Absolute: 315 cells/uL (ref 200–950)
NEUTROS ABS: 1935 {cells}/uL (ref 1500–7800)
Neutrophils Relative %: 43 %
PLATELETS: 247 10*3/uL (ref 140–400)
RBC: 4.75 MIL/uL (ref 3.80–5.10)
RDW: 13.6 % (ref 11.0–15.0)
WBC: 4.5 10*3/uL (ref 3.8–10.8)

## 2016-11-11 LAB — POC URINALSYSI DIPSTICK (AUTOMATED)
BILIRUBIN UA: NEGATIVE
Blood, UA: NEGATIVE
GLUCOSE UA: NEGATIVE
KETONES UA: NEGATIVE
Leukocytes, UA: NEGATIVE
Nitrite, UA: NEGATIVE
Protein, UA: NEGATIVE
SPEC GRAV UA: 1.025 (ref 1.010–1.025)
UROBILINOGEN UA: 0.2 U/dL
pH, UA: 7 (ref 5.0–8.0)

## 2016-11-11 MED ORDER — DICYCLOMINE HCL 10 MG PO CAPS: 10.0000 mg | ORAL_CAPSULE | Freq: Four times a day (QID) | ORAL | 3 refills | Status: DC | PRN

## 2016-11-11 MED ORDER — AMLODIPINE BESYLATE 5 MG PO TABS: 5.0000 mg | ORAL_TABLET | Freq: Every day | ORAL | 3 refills | Status: DC

## 2016-11-11 MED ORDER — HYDROCHLOROTHIAZIDE 25 MG PO TABS: 25.0000 mg | ORAL_TABLET | Freq: Every day | ORAL | 3 refills | Status: DC

## 2016-11-11 MED ORDER — ONDANSETRON HCL 4 MG PO TABS: 4.0000 mg | ORAL_TABLET | Freq: Three times a day (TID) | ORAL | 1 refills | Status: DC | PRN

## 2016-11-11 NOTE — Progress Notes (Signed)
Pre visit review using our clinic review tool, if applicable. No additional management support is needed unless otherwise documented below in the visit note. 

## 2016-11-11 NOTE — Patient Instructions (Addendum)
Labs today. Urinalysis today. See Leah Peterson to schedule CT scan for next week. Continue zofran and bentyl as needed for abdominal cramping and nausea.  We will be in touch with results.   Health Maintenance, Female Adopting a healthy lifestyle and getting preventive care can go a long way to promote health and wellness. Talk with your health care provider about what schedule of regular examinations is right for you. This is a good chance for you to check in with your provider about disease prevention and staying healthy. In between checkups, there are plenty of things you can do on your own. Experts have done a lot of research about which lifestyle changes and preventive measures are most likely to keep you healthy. Ask your health care provider for more information. Weight and diet Eat a healthy diet  Be sure to include plenty of vegetables, fruits, low-fat dairy products, and lean protein.  Do not eat a lot of foods high in solid fats, added sugars, or salt.  Get regular exercise. This is one of the most important things you can do for your health. ? Most adults should exercise for at least 150 minutes each week. The exercise should increase your heart rate and make you sweat (moderate-intensity exercise). ? Most adults should also do strengthening exercises at least twice a week. This is in addition to the moderate-intensity exercise.  Maintain a healthy weight  Body mass index (BMI) is a measurement that can be used to identify possible weight problems. It estimates body fat based on height and weight. Your health care provider can help determine your BMI and help you achieve or maintain a healthy weight.  For females 36 years of age and older: ? A BMI below 18.5 is considered underweight. ? A BMI of 18.5 to 24.9 is normal. ? A BMI of 25 to 29.9 is considered overweight. ? A BMI of 30 and above is considered obese.  Watch levels of cholesterol and blood lipids  You should start having  your blood tested for lipids and cholesterol at 59 years of age, then have this test every 5 years.  You may need to have your cholesterol levels checked more often if: ? Your lipid or cholesterol levels are high. ? You are older than 59 years of age. ? You are at high risk for heart disease.  Cancer screening Lung Cancer  Lung cancer screening is recommended for adults 51-68 years old who are at high risk for lung cancer because of a history of smoking.  A yearly low-dose CT scan of the lungs is recommended for people who: ? Currently smoke. ? Have quit within the past 15 years. ? Have at least a 30-pack-year history of smoking. A pack year is smoking an average of one pack of cigarettes a day for 1 year.  Yearly screening should continue until it has been 15 years since you quit.  Yearly screening should stop if you develop a health problem that would prevent you from having lung cancer treatment.  Breast Cancer  Practice breast self-awareness. This means understanding how your breasts normally appear and feel.  It also means doing regular breast self-exams. Let your health care provider know about any changes, no matter how small.  If you are in your 20s or 30s, you should have a clinical breast exam (CBE) by a health care provider every 1-3 years as part of a regular health exam.  If you are 53 or older, have a CBE every year. Also  consider having a breast X-ray (mammogram) every year.  If you have a family history of breast cancer, talk to your health care provider about genetic screening.  If you are at high risk for breast cancer, talk to your health care provider about having an MRI and a mammogram every year.  Breast cancer gene (BRCA) assessment is recommended for women who have family members with BRCA-related cancers. BRCA-related cancers include: ? Breast. ? Ovarian. ? Tubal. ? Peritoneal cancers.  Results of the assessment will determine the need for genetic  counseling and BRCA1 and BRCA2 testing.  Cervical Cancer Your health care provider may recommend that you be screened regularly for cancer of the pelvic organs (ovaries, uterus, and vagina). This screening involves a pelvic examination, including checking for microscopic changes to the surface of your cervix (Pap test). You may be encouraged to have this screening done every 3 years, beginning at age 43.  For women ages 42-65, health care providers may recommend pelvic exams and Pap testing every 3 years, or they may recommend the Pap and pelvic exam, combined with testing for human papilloma virus (HPV), every 5 years. Some types of HPV increase your risk of cervical cancer. Testing for HPV may also be done on women of any age with unclear Pap test results.  Other health care providers may not recommend any screening for nonpregnant women who are considered low risk for pelvic cancer and who do not have symptoms. Ask your health care provider if a screening pelvic exam is right for you.  If you have had past treatment for cervical cancer or a condition that could lead to cancer, you need Pap tests and screening for cancer for at least 20 years after your treatment. If Pap tests have been discontinued, your risk factors (such as having a new sexual partner) need to be reassessed to determine if screening should resume. Some women have medical problems that increase the chance of getting cervical cancer. In these cases, your health care provider may recommend more frequent screening and Pap tests.  Colorectal Cancer  This type of cancer can be detected and often prevented.  Routine colorectal cancer screening usually begins at 59 years of age and continues through 59 years of age.  Your health care provider may recommend screening at an earlier age if you have risk factors for colon cancer.  Your health care provider may also recommend using home test kits to check for hidden blood in the  stool.  A small camera at the end of a tube can be used to examine your colon directly (sigmoidoscopy or colonoscopy). This is done to check for the earliest forms of colorectal cancer.  Routine screening usually begins at age 53.  Direct examination of the colon should be repeated every 5-10 years through 59 years of age. However, you may need to be screened more often if early forms of precancerous polyps or small growths are found.  Skin Cancer  Check your skin from head to toe regularly.  Tell your health care provider about any new moles or changes in moles, especially if there is a change in a mole's shape or color.  Also tell your health care provider if you have a mole that is larger than the size of a pencil eraser.  Always use sunscreen. Apply sunscreen liberally and repeatedly throughout the day.  Protect yourself by wearing long sleeves, pants, a wide-brimmed hat, and sunglasses whenever you are outside.  Heart disease, diabetes, and high blood  pressure  High blood pressure causes heart disease and increases the risk of stroke. High blood pressure is more likely to develop in: ? People who have blood pressure in the high end of the normal range (130-139/85-89 mm Hg). ? People who are overweight or obese. ? People who are African American.  If you are 80-76 years of age, have your blood pressure checked every 3-5 years. If you are 48 years of age or older, have your blood pressure checked every year. You should have your blood pressure measured twice-once when you are at a hospital or clinic, and once when you are not at a hospital or clinic. Record the average of the two measurements. To check your blood pressure when you are not at a hospital or clinic, you can use: ? An automated blood pressure machine at a pharmacy. ? A home blood pressure monitor.  If you are between 49 years and 42 years old, ask your health care provider if you should take aspirin to prevent  strokes.  Have regular diabetes screenings. This involves taking a blood sample to check your fasting blood sugar level. ? If you are at a normal weight and have a low risk for diabetes, have this test once every three years after 59 years of age. ? If you are overweight and have a high risk for diabetes, consider being tested at a younger age or more often. Preventing infection Hepatitis B  If you have a higher risk for hepatitis B, you should be screened for this virus. You are considered at high risk for hepatitis B if: ? You were born in a country where hepatitis B is common. Ask your health care provider which countries are considered high risk. ? Your parents were born in a high-risk country, and you have not been immunized against hepatitis B (hepatitis B vaccine). ? You have HIV or AIDS. ? You use needles to inject street drugs. ? You live with someone who has hepatitis B. ? You have had sex with someone who has hepatitis B. ? You get hemodialysis treatment. ? You take certain medicines for conditions, including cancer, organ transplantation, and autoimmune conditions.  Hepatitis C  Blood testing is recommended for: ? Everyone born from [birth year] through 1963. ? Anyone with known risk factors for hepatitis C.  Sexually transmitted infections (STIs)  You should be screened for sexually transmitted infections (STIs) including gonorrhea and chlamydia if: ? You are sexually active and are younger than 59 years of age. ? You are older than 59 years of age and your health care provider tells you that you are at risk for this type of infection. ? Your sexual activity has changed since you were last screened and you are at an increased risk for chlamydia or gonorrhea. Ask your health care provider if you are at risk.  If you do not have HIV, but are at risk, it may be recommended that you take a prescription medicine daily to prevent HIV infection. This is called pre-exposure prophylaxis  (PrEP). You are considered at risk if: ? You are sexually active and do not regularly use condoms or know the HIV status of your partner(s). ? You take drugs by injection. ? You are sexually active with a partner who has HIV.  Talk with your health care provider about whether you are at high risk of being infected with HIV. If you choose to begin PrEP, you should first be tested for HIV. You should then be tested every  3 months for as long as you are taking PrEP. Pregnancy  If you are premenopausal and you may become pregnant, ask your health care provider about preconception counseling.  If you may become pregnant, take 400 to 800 micrograms (mcg) of folic acid every day.  If you want to prevent pregnancy, talk to your health care provider about birth control (contraception). Osteoporosis and menopause  Osteoporosis is a disease in which the bones lose minerals and strength with aging. This can result in serious bone fractures. Your risk for osteoporosis can be identified using a bone density scan.  If you are 11 years of age or older, or if you are at risk for osteoporosis and fractures, ask your health care provider if you should be screened.  Ask your health care provider whether you should take a calcium or vitamin D supplement to lower your risk for osteoporosis.  Menopause may have certain physical symptoms and risks.  Hormone replacement therapy may reduce some of these symptoms and risks. Talk to your health care provider about whether hormone replacement therapy is right for you. Follow these instructions at home:  Schedule regular health, dental, and eye exams.  Stay current with your immunizations.  Do not use any tobacco products including cigarettes, chewing tobacco, or electronic cigarettes.  If you are pregnant, do not drink alcohol.  If you are breastfeeding, limit how much and how often you drink alcohol.  Limit alcohol intake to no more than 1 drink per day for  nonpregnant women. One drink equals 12 ounces of beer, 5 ounces of wine, or 1 ounces of hard liquor.  Do not use street drugs.  Do not share needles.  Ask your health care provider for help if you need support or information about quitting drugs.  Tell your health care provider if you often feel depressed.  Tell your health care provider if you have ever been abused or do not feel safe at home. This information is not intended to replace advice given to you by your health care provider. Make sure you discuss any questions you have with your health care provider. Document Released: 11/01/2010 Document Revised: 09/24/2015 Document Reviewed: 01/20/2015 Elsevier Interactive Patient Education  [employer].

## 2016-11-11 NOTE — Assessment & Plan Note (Signed)
Chronic off statin. Improvement noted. Discussed with patient. The 10-year ASCVD risk score Leah Peterson Leah Peterson., et al., 2013) is: 11.1%   Values used to calculate the score:     Age: 61 years     Sex: Female     Is Non-Hispanic African American: Yes     Diabetic: Yes     Tobacco smoker: No     Systolic Blood Pressure: 750 mmHg     Is BP treated: Yes     HDL Cholesterol: 48 mg/dL     Total Cholesterol: 174 mg/dL

## 2016-11-11 NOTE — Assessment & Plan Note (Signed)
Ongoing issue despite normal EGD last year. She did not let me know she was continuing to have these episodes. Abd [place] concern for possible gallstone in cystic duct last year. Will check LFT, lipase, CBC and CT with contrast. Consider return to GI vs gen surg eval for gallstones.

## 2016-11-11 NOTE — Assessment & Plan Note (Signed)
Stable labs

## 2016-11-11 NOTE — Assessment & Plan Note (Signed)
Not regular with replacement. Encouraged she start taking daily.

## 2016-11-11 NOTE — Assessment & Plan Note (Signed)
Chronic, mildly elevated today. Continue to monitor.  

## 2016-11-11 NOTE — Progress Notes (Signed)
BP 120/90   Pulse 78   Temp 98.6 F (37 C)   Ht 5' 1.25" (1.556 m)   Wt 261 lb (118.4 kg)   SpO2 98%   BMI 48.91 kg/m    CC: CPE Subjective:    Patient ID: Leah Peterson, female    DOB: 06/14/1955, 61 y.o.   MRN: [id number]  HPI: Leah Peterson is a 61 y.o. female presenting on 11/11/2016 for Annual Exam; Abdominal Cramping; and Vomiting   Here with daughter Jenny Reichmann today.  Ongoing episodes of abdominal cramping associated with nausea/vomiting, NBNB. Notes cramping and vomiting worse after a bowel movement. Abdominal pain located at left side of abdomen, never right. This happens a few times a month. Bentyl helps but sometimes precipitates vomiting. Currently also uses zofran for nausea. Currently with abd cramps. Never RUQ pain.   Preventative: COLONOSCOPY WITH PROPOFOL Laterality: N/A Date: 06/30/2015 diverticulosis, rpt 10 yrs Gatha Mayer, MD) Mammogram 09/2016 WNL Pap smear - always normal last 2016. No irregular bleeding. LMP 10/2007. No post menopausal sxs.  Flu shot yearly Tetanus 2010.  Pneumovax 07/2013 Seat belt use discussed Sunscreen use discussed. No suspicious moles on skin. Ex smoker - quit remotely Alcohol - seldom  Caffeine: 2-3 cups coffee, 2 cups sweet tea  Lives with daughter and grand daughter. No pets.  Divorced; 1 daughter Occupation: Administrator at [employer]  Activity: cares for granddaughter. No regular exercise.  Diet: good water, fruits/vegetables daily, drinks a lot of sweet tea  Relevant past medical, surgical, family and social history reviewed and updated as indicated. Interim medical history since our last visit reviewed. Allergies and medications reviewed and updated. Outpatient Medications Prior to Visit  Medication Sig Dispense Refill  . cetirizine (ZYRTEC) 10 MG tablet Take 10 mg by mouth at bedtime as needed for allergies.     . cholecalciferol (VITAMIN D) 1000 units tablet Take 1,000 Units by mouth daily.    . Multiple  Vitamin (MULTIVITAMIN WITH MINERALS) TABS tablet Take 1 tablet by mouth daily.    Marland Kitchen amLODipine (NORVASC) 5 MG tablet TAKE 1 TABLET BY MOUTH EVERY DAY 90 tablet 3  . dicyclomine (BENTYL) 10 MG capsule Take 1 capsule (10 mg total) by mouth 4 (four) times daily as needed for spasms. 30 capsule 0  . hydrochlorothiazide (HYDRODIURIL) 25 MG tablet TAKE 1 TABLET (25 MG TOTAL) BY MOUTH DAILY. 90 tablet 2  . ondansetron (ZOFRAN) 4 MG tablet Take 1 tablet (4 mg total) by mouth every 8 (eight) hours as needed for nausea or vomiting. 30 tablet 1   No facility-administered medications prior to visit.      Per HPI unless specifically indicated in ROS section below Review of Systems  Constitutional: Negative for activity change, appetite change, chills, fatigue, fever and unexpected weight change.  HENT: Negative for hearing loss.   Eyes: Negative for visual disturbance.  Respiratory: Negative for cough, chest tightness, shortness of breath and wheezing.   Cardiovascular: Negative for chest pain, palpitations and leg swelling.  Gastrointestinal: Positive for abdominal pain, nausea and vomiting. Negative for abdominal distention, blood in stool, constipation and diarrhea.  Genitourinary: Negative for difficulty urinating and hematuria.  Musculoskeletal: Negative for arthralgias, myalgias and neck pain.  Skin: Negative for rash.  Neurological: Negative for dizziness, seizures, syncope and headaches.  Hematological: Negative for adenopathy. Does not bruise/bleed easily.  Psychiatric/Behavioral: Negative for dysphoric mood. The patient is not nervous/anxious.        Objective:    BP 120/90  Pulse 78   Temp 98.6 F (37 C)   Ht 5' 1.25" (1.556 m)   Wt 261 lb (118.4 kg)   SpO2 98%   BMI 48.91 kg/m   Wt Readings from Last 3 Encounters:  11/11/16 261 lb (118.4 kg)  04/18/16 272 lb 1.9 oz (123.4 kg)  12/23/15 275 lb 6 oz (124.9 kg)    Physical Exam  Constitutional: She is oriented to person,  place, and time. She appears well-developed and well-nourished. No distress.  HENT:  Head: Normocephalic and atraumatic.  Right Ear: Hearing, tympanic membrane, external ear and ear canal normal.  Left Ear: Hearing, tympanic membrane, external ear and ear canal normal.  Nose: Nose normal.  Mouth/Throat: Uvula is midline, oropharynx is clear and moist and mucous membranes are normal. No oropharyngeal exudate, posterior oropharyngeal edema or posterior oropharyngeal erythema.  Eyes: Pupils are equal, round, and reactive to light. Conjunctivae and EOM are normal. No scleral icterus.  Neck: Normal range of motion. Neck supple. No thyromegaly present.  Cardiovascular: Normal rate, regular rhythm, normal heart sounds and intact distal pulses.   No murmur heard. Pulses:      Radial pulses are 2+ on the right side, and 2+ on the left side.  Pulmonary/Chest: Effort normal and breath sounds normal. No respiratory distress. She has no wheezes. She has no rales.  Abdominal: Soft. Normal appearance and bowel sounds are normal. She exhibits no distension and no mass. There is no hepatosplenomegaly. There is tenderness (mild) in the left lower quadrant. There is no rigidity, no rebound, no guarding and negative Murphy's sign.  Musculoskeletal: Normal range of motion. She exhibits no edema.  Lymphadenopathy:    She has no cervical adenopathy.  Neurological: She is alert and oriented to person, place, and time.  CN grossly intact, station and gait intact  Skin: Skin is warm and dry. No rash noted.  Psychiatric: She has a normal mood and affect. Her behavior is normal. Judgment and thought content normal.  Nursing note and vitals reviewed.  Results for orders placed or performed in visit on 11/09/16  Lipid panel  Result Value Ref Range   Cholesterol 174 0 - 200 mg/dL   Triglycerides 63.0 0.0 - 149.0 mg/dL   HDL 48.00 >39.00 mg/dL   VLDL 12.6 0.0 - 40.0 mg/dL   LDL Cholesterol 113 (H) 0 - 99 mg/dL    Total CHOL/HDL Ratio 4    NonHDL 354.56   Basic metabolic panel  Result Value Ref Range   Sodium 141 135 - 145 mEq/L   Potassium 3.6 3.5 - 5.1 mEq/L   Chloride 102 96 - 112 mEq/L   CO2 32 19 - 32 mEq/L   Glucose, Bld 98 70 - 99 mg/dL   BUN 13 6 - 23 mg/dL   Creatinine, Ser 1.10 0.40 - 1.20 mg/dL   Calcium 9.4 8.4 - 10.5 mg/dL   GFR 65.44 >60.00 mL/min  VITAMIN D 25 Hydroxy (Vit-D Deficiency, Fractures)  Result Value Ref Range   VITD 25.59 (L) 30.00 - 100.00 ng/mL  Hemoglobin A1c  Result Value Ref Range   Hgb A1c MFr Bld 6.1 4.6 - 6.5 %      Assessment & Plan:   Problem List Items Addressed This Visit    Abdominal pain    H/o liver abscess. Update labs (CBC, LFT, lipase) and UA as well as CT scan. Known gallstones. ?small gallstone intermittently blocking a duct leading to intermittent symptoms. Last year's EGD reassuringly normal. May merit return  to GI vs surgery referral.       Relevant Orders   CT ABDOMEN PELVIS W CONTRAST   CBC with Differential/Platelet   Hepatic function panel   Lipase   Glaucoma   Relevant Medications   latanoprost (XALATAN) 0.005 % ophthalmic solution   Healthcare maintenance - Primary   HLD (hyperlipidemia)    Chronic off statin. Improvement noted. Discussed with patient. The 10-year ASCVD risk score Mikey Bussing DC Brooke Bonito., et al., 2013) is: 11.1%   Values used to calculate the score:     Age: 61 years     Sex: Female     Is Non-Hispanic African American: Yes     Diabetic: Yes     Tobacco smoker: No     Systolic Blood Pressure: 595 mmHg     Is BP treated: Yes     HDL Cholesterol: 48 mg/dL     Total Cholesterol: 174 mg/dL       Relevant Medications   amLODipine (NORVASC) 5 MG tablet   hydrochlorothiazide (HYDRODIURIL) 25 MG tablet   HYPERTENSION, BENIGN ESSENTIAL    Chronic, mildly elevated today. Continue to monitor.      Relevant Medications   amLODipine (NORVASC) 5 MG tablet   hydrochlorothiazide (HYDRODIURIL) 25 MG tablet   Morbid  obesity with BMI of 50.0-59.9, adult (HCC)    Ongoing slow weight loss due to GI concerns.       Persistent vomiting    Ongoing issue despite normal EGD last year. She did not let me know she was continuing to have these episodes. Abd [place] concern for possible gallstone in cystic duct last year. Will check LFT, lipase, CBC and CT with contrast. Consider return to GI vs gen surg eval for gallstones.       Relevant Medications   ondansetron (ZOFRAN) 4 MG tablet   Other Relevant Orders   CT ABDOMEN PELVIS W CONTRAST   CBC with Differential/Platelet   Hepatic function panel   Lipase   Prediabetes    Stable labs.       Vitamin D deficiency    Not regular with replacement. Encouraged she start taking daily.           Follow up plan: Return if symptoms worsen or fail to improve.  Ria Bush, MD

## 2016-11-11 NOTE — Assessment & Plan Note (Addendum)
Ongoing slow weight loss due to GI concerns.

## 2016-11-11 NOTE — Addendum Note (Signed)
Addended by: Mady Haagensen on: 11/11/2016 02:57 PM   Modules accepted: Orders

## 2016-11-11 NOTE — Assessment & Plan Note (Signed)
H/o liver abscess. Update labs (CBC, LFT, lipase) and UA as well as CT scan. Known gallstones. ?small gallstone intermittently blocking a duct leading to intermittent symptoms. Last year's EGD reassuringly normal. May merit return to GI vs surgery referral.

## 2016-11-12 ENCOUNTER — Telehealth: Payer: Self-pay | Admitting: Family Medicine

## 2016-11-12 LAB — HEPATIC FUNCTION PANEL
ALBUMIN: 4.1 g/dL (ref 3.6–5.1)
ALK PHOS: 56 U/L (ref 33–130)
ALT: 10 U/L (ref 6–29)
AST: 12 U/L (ref 10–35)
Bilirubin, Direct: 0.1 mg/dL (ref <=0.2)
Indirect Bilirubin: 0.4 mg/dL (ref 0.2–1.2)
TOTAL PROTEIN: 7.5 g/dL (ref 6.1–8.1)
Total Bilirubin: 0.5 mg/dL (ref 0.2–1.2)

## 2016-11-12 LAB — LIPASE: Lipase: 25 U/L (ref 7–60)

## 2016-11-12 MED ORDER — PREDNISONE 50 MG PO TABS: ORAL_TABLET | ORAL | 0 refills | Status: DC

## 2016-11-12 MED ORDER — DIPHENHYDRAMINE HCL 50 MG PO TABS: ORAL_TABLET | ORAL | 0 refills | Status: DC

## 2016-11-12 NOTE — Telephone Encounter (Signed)
plz call pt - let her know I've sent in premedication medicine to take prior to upcoming CT scan.  Will need a driver to take her.

## 2016-11-15 ENCOUNTER — Ambulatory Visit
Admission: RE | Admit: 2016-11-15 | Discharge: 2016-11-15 | Disposition: A | Discharge status: Home / Self Care | Payer: BC Managed Care – PPO | Source: Ambulatory Visit | Attending: Family Medicine | Admitting: Family Medicine

## 2016-11-15 DIAGNOSIS — R109 Unspecified abdominal pain: Secondary | ICD-10-CM

## 2016-11-15 DIAGNOSIS — R1115 Cyclical vomiting syndrome unrelated to migraine: Secondary | ICD-10-CM

## 2016-11-15 DIAGNOSIS — D259 Leiomyoma of uterus, unspecified: Secondary | ICD-10-CM | POA: Insufficient documentation

## 2016-11-15 DIAGNOSIS — K439 Ventral hernia without obstruction or gangrene: Secondary | ICD-10-CM | POA: Insufficient documentation

## 2016-11-15 DIAGNOSIS — K5730 Diverticulosis of large intestine without perforation or abscess without bleeding: Secondary | ICD-10-CM | POA: Insufficient documentation

## 2016-11-15 DIAGNOSIS — N838 Other noninflammatory disorders of ovary, fallopian tube and broad ligament: Secondary | ICD-10-CM | POA: Insufficient documentation

## 2016-11-15 DIAGNOSIS — R1110 Vomiting, unspecified: Secondary | ICD-10-CM | POA: Diagnosis present

## 2016-11-15 DIAGNOSIS — K8020 Calculus of gallbladder without cholecystitis without obstruction: Secondary | ICD-10-CM | POA: Diagnosis not present

## 2016-11-15 MED ORDER — IOPAMIDOL (ISOVUE-300) INJECTION 61%
125.0000 mL | Freq: Once | INTRAVENOUS | Status: AC | PRN
  Administered 2016-11-15: 150 mL via INTRAVENOUS

## 2016-11-16 ENCOUNTER — Other Ambulatory Visit: Payer: Self-pay | Admitting: Family Medicine

## 2016-11-16 DIAGNOSIS — N838 Other noninflammatory disorders of ovary, fallopian tube and broad ligament: Secondary | ICD-10-CM | POA: Insufficient documentation

## 2016-11-16 DIAGNOSIS — D259 Leiomyoma of uterus, unspecified: Secondary | ICD-10-CM

## 2016-11-23 ENCOUNTER — Ambulatory Visit
Admission: RE | Admit: 2016-11-23 | Discharge: 2016-11-23 | Disposition: A | Discharge status: Home / Self Care | Payer: BC Managed Care – PPO | Source: Ambulatory Visit | Attending: Family Medicine | Admitting: Family Medicine

## 2016-11-23 DIAGNOSIS — N83202 Unspecified ovarian cyst, left side: Secondary | ICD-10-CM | POA: Diagnosis not present

## 2016-11-23 DIAGNOSIS — N838 Other noninflammatory disorders of ovary, fallopian tube and broad ligament: Secondary | ICD-10-CM

## 2016-11-23 DIAGNOSIS — D259 Leiomyoma of uterus, unspecified: Secondary | ICD-10-CM | POA: Diagnosis present

## 2016-11-23 DIAGNOSIS — N839 Noninflammatory disorder of ovary, fallopian tube and broad ligament, unspecified: Secondary | ICD-10-CM | POA: Insufficient documentation

## 2016-11-24 ENCOUNTER — Other Ambulatory Visit: Payer: Self-pay | Admitting: Family Medicine

## 2016-11-24 DIAGNOSIS — N838 Other noninflammatory disorders of ovary, fallopian tube and broad ligament: Secondary | ICD-10-CM

## 2016-11-28 ENCOUNTER — Ambulatory Visit (INDEPENDENT_AMBULATORY_CARE_PROVIDER_SITE_OTHER): Payer: BC Managed Care – PPO | Admitting: Obstetrics and Gynecology

## 2016-11-28 ENCOUNTER — Encounter: Payer: Self-pay | Admitting: Obstetrics and Gynecology

## 2016-11-28 VITALS — BP 152/97 | HR 68 | Ht 61.5 in | Wt 263.0 lb

## 2016-11-28 DIAGNOSIS — N839 Noninflammatory disorder of ovary, fallopian tube and broad ligament, unspecified: Secondary | ICD-10-CM | POA: Diagnosis not present

## 2016-11-28 DIAGNOSIS — N838 Other noninflammatory disorders of ovary, fallopian tube and broad ligament: Secondary | ICD-10-CM

## 2016-11-28 NOTE — Progress Notes (Addendum)
Obstetrics and Gynecology Patient Consult   Appointment Date: 11/28/2016  OBGYN Clinic: [facility]  Primary Care Provider: Ria Peterson  Referring Provider: Ria Bush, MD  Chief Complaint: LLQ pain, left ovarian cyst  History of Present Illness: Leah Peterson is a 61 y.o. African-American G1P1001 (LMP 2009.), seen for the above chief complaint.   Patient seen in consultation from Dr. Danise Peterson for LO complex cyst.   Patient seen by him on 7/13 for several month history of LLQ pain and discomfort. CT scan showed left adnexa cyst and u/s was confirmatory (see below).  Patient being worked up for weight loss and GI issues.   No fevers, chills, chest pain, SOB, nausea, vomiting, current abdominal pain, dysuria, blood in BMs or hematuria.   Review of Systems: as noted in the History of Present Illness.  Past Medical History:  Past Medical History:  Diagnosis Date  . Diverticulosis of colon (without mention of hemorrhage)   . DVT (deep venous thrombosis) ([place]) 2015   bilateral posterior tibial veins treated with IVC filter  . Essential hypertension, benign    Blood pressure tends to elevate with MD visits.  . Gallstones   . Glaucoma 09/2016   left sided mainly  . History of diverticulitis   . IBS (irritable bowel syndrome)   . Left knee pain 2012   eval by ortho - bone spurs and DJD, treated with steroid shot which helped temporarily  . Liver abscess 2015   hospitalization for severe septic shock after diverticulitis  . Obesity, unspecified   . Other abnormal glucose   . Pure hypercholesterolemia   . Transfusion history    '15-"diverticulosis"  . Unspecified vitamin D deficiency    resolved    Past Surgical History:  Past Surgical History:  Procedure Laterality Date  . CESAREAN SECTION     due to BP elevation  . COLONOSCOPY  2000   Diverticuli-Dr. Sharlett Peterson  . COLONOSCOPY WITH PROPOFOL N/A 06/30/2015   diverticulosis,  rpt 10 yrs Leah Mayer, MD)  . DRAINAGE ABD/PERITON ABS PERC ([place] [facility])  2015   liver abscess  . ESOPHAGOGASTRODUODENOSCOPY (EGD) WITH PROPOFOL N/A 12/30/2015   WNL Leah Mayer, MD)    Past Obstetrical History:  OB History  Gravida Para Term Preterm AB Living  1 1 1  0 0 1  SAB TAB Ectopic Multiple Live Births  0 0 0 0 1    # Outcome Date GA Lbr Len/2nd Weight Sex Delivery Anes PTL Lv  1 Term  [redacted]w[redacted]d    CS-LTranv   LIV     Complications: Fetal Intolerance     Past Gynecological History: As per HPI. She denies any postmenopausal bleeding or spotting ever No history of abnormal pap smears (last pap smear: 2016, which was negative) No HRT use.   Social History:  Social History   Social History  . Marital status: Divorced    Spouse name: N/A  . Number of children: 1  . Years of education: N/A   Occupational History  . Analyst; Dept of [employer]    Social History Main Topics  . Smoking status: Former Smoker    Years: 3.00    Quit date: 06/15/1977  . Smokeless tobacco: Never Used  . Alcohol use Yes     Comment: wine on occasion   . Drug use: No  . Sexual activity: No     Comment: By choice   Other Topics Concern  . Not on file  Social History Narrative   Caffeine: 2-3 cups coffee, 2 cups sweet tea   Lives with daughter and grand daughter.  No pets.   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   Activity: granddaughter.  No regular exercise.   Diet: fruits/vegetables daily, drinks a lot of sweet tea    Family History:  Family History  Problem Relation Age of Onset  . Hypertension Father   . Cancer Father        prostate  . Heart disease Father        CABG x 2  . Cancer Mother        Lung-quit smoking 20 years prior to Dx  . Hypertension Mother   . Drug abuse Brother   . Diabetes Unknown        + family hx  . Stroke Maternal Grandmother   . Sleep apnea Brother        with [employer] estranged  . Colon  cancer Neg Hx    See "media" for sheet  Health Maintenance:  Mammogram Yes.  which was done in 09/2016;  Colonoscopy Yes.   06/2015  Medications Leah Peterson had no medications administered during this visit. Current Outpatient Prescriptions  Medication Sig Dispense Refill  . amLODipine (NORVASC) 5 MG tablet Take 1 tablet (5 mg total) by mouth daily. 90 tablet 3  . cetirizine (ZYRTEC) 10 MG tablet Take 10 mg by mouth at bedtime as needed for allergies.     . cholecalciferol (VITAMIN D) 1000 units tablet Take 1,000 Units by mouth daily.    Marland Kitchen dicyclomine (BENTYL) 10 MG capsule Take 1 capsule (10 mg total) by mouth 4 (four) times daily as needed for spasms. 30 capsule 3  . diphenhydrAMINE (BENADRYL) 50 MG tablet Take one tablet 1 hour prior to CT scan. 1 tablet 0  . hydrochlorothiazide (HYDRODIURIL) 25 MG tablet Take 1 tablet (25 mg total) by mouth daily. 90 tablet 3  . latanoprost (XALATAN) 0.005 % ophthalmic solution Place 0.005 drops into both eyes at bedtime.  0  . Multiple Vitamin (MULTIVITAMIN WITH MINERALS) TABS tablet Take 1 tablet by mouth daily.    . ondansetron (ZOFRAN) 4 MG tablet Take 1 tablet (4 mg total) by mouth every 8 (eight) hours as needed for nausea or vomiting. 30 tablet 1  . predniSONE (DELTASONE) 50 MG tablet Take one tablet 13 hours, 7 hours, and 1 hour prior to CT scan 3 tablet 0   No current facility-administered medications for this visit.     Allergies Iodinated diagnostic agents; Lisinopril; and Penicillins   Physical Exam:  BP (!) 152/97   Pulse 68   Ht 5' 1.5" (1.562 m)   Wt 263 lb (119.3 kg)   BMI 48.89 kg/m  Body mass index is 48.89 kg/m. General appearance: Well nourished, well developed female in no acute distress.   Cardiovascular: normal s1 and s2.  No murmurs, rubs or gallops. Respiratory:  Clear to auscultation bilateral. Normal respiratory effort Abdomen: obese, soft, nttp, nd. 4cm ventral hernia approx 5cm about the umbilicus, nttp, easily  reducible.  Neuro/Psych:  Normal mood and affect.  Skin:  Warm and dry.   Laboratory: none  Radiology:  CLINICAL DATA:  Known uterine fibroids and possible left ovarian mass  EXAM: TRANSABDOMINAL AND TRANSVAGINAL ULTRASOUND OF PELVIS  TECHNIQUE: Both transabdominal and transvaginal ultrasound examinations of the pelvis were performed. Transabdominal technique was performed for global imaging of the pelvis  including uterus, ovaries, adnexal regions, and pelvic cul-de-sac. It was necessary to proceed with endovaginal exam following the transabdominal exam to visualize the ovaries.  COMPARISON:  11/15/2016  FINDINGS: Uterus  Measurements: 9.2 x 4.7 x 4.8 cm. Several uterine fibroids are noted. The largest of these lies within the uterine body on the right measuring 4.6 cm. A second large 3.1 cm fundal fibroid is seen. Smaller 1 cm posterior fibroid is noted.  Endometrium  Thickness: 7 mm.  No focal abnormality visualized.  Right ovary  Not well visualized  Left ovary  Measurements: 5.4 x 4.7 x 7.1 cm. A large septated cyst is noted measuring 7.4 cm in greatest dimension. This corresponds to that seen on recent CT examination.  Other findings  No abnormal free fluid.  IMPRESSION: Large mildly septated left ovarian cyst measuring 7.4 cm in greatest dimension. Given the patient's age and lack of recent menstrual cycle, follow-up MRI is recommended for further evaluation. Surgical consultation may also be pursued. This recommendation follows the consensus statement: Management of Asymptomatic Ovarian and Other Adnexal Cysts Imaged at [place]: Society of Radiologists in [place]. Radiology 2010; [phone].  Multiple uterine fibroids.  Nonvisualization of the right ovary.   Electronically Signed   By: Inez Catalina M.D.   On: 11/23/2016 16:36  CLINICAL DATA:  Left lower quadrant abdominal pain for the past  18 months. 60 pound weight loss over that time period. Loss of appetite. Nausea and vomiting.  EXAM: CT ABDOMEN AND PELVIS WITH CONTRAST  TECHNIQUE: Multidetector CT imaging of the abdomen and pelvis was performed using the standard protocol following bolus administration of intravenous contrast.  CONTRAST:  125 mL ISOVUE-300 IOPAMIDOL (ISOVUE-300) INJECTION 61%  COMPARISON:  Abdomen and pelvis CT dated 11/13/2013. Abdomen ultrasound dated 10/23/2015.  FINDINGS: Lower chest: Clear lung bases.  Hepatobiliary: Small gallstones in the dependent portion of the gallbladder. The largest stone measures 5 mm. No gallbladder wall thickening or pericholecystic fluid. Normal appearing liver.  Pancreas: Unremarkable. No pancreatic ductal dilatation or surrounding inflammatory changes.  Spleen: Normal in size without focal abnormality.  Adrenals/Urinary Tract: Adrenal glands are unremarkable. Kidneys are normal, without renal calculi, focal lesion, or hydronephrosis. Bladder is unremarkable.  Stomach/Bowel: Large number of sigmoid and descending colon diverticula. No evidence of diverticulitis. Diffuse proximal sigmoid colon wall thickening is unchanged. a Normal appearing appendix, stomach and small bowel.  Vascular/Lymphatic: No significant vascular findings are present. No enlarged abdominal or pelvic lymph nodes. Mildly prominent bilateral obturator lymph nodes have not changed significantly.  Reproductive: Multiple rounded and oval uterine masses. There is a small amount of fluid and air in the endometrial cavity. Interval left adnexal fluid collection. This measures 7.1 cm in AP diameter on image number 59 of series 2 and 5.0 x 4.1 cm on coronal image number 44. The ovary is not visualized separate from this fluid collection. This is at the location of the previously demonstrated left ovary. Normal appearing right ovary.  Other: Upper abdominal ventral hernia  containing fat to the right of midline. The herniated fat measures 3.4 cm in maximum diameter.  Musculoskeletal: Lumbar and lower thoracic spine degenerative changes. Mild right and minimal left hip degenerative changes.  IMPRESSION: 1. Interval 7.1 x 5.0 x 4.1 cm left adnexal cyst, most likely arising from the left ovary. This is concerning for the possibility of a cystic left ovarian neoplasm although no solid components are visible by CT. Further evaluation with transabdominal and transvaginal pelvic ultrasound is recommended. 2.  Stable diffuse proximal sigmoid colon wall thickening which is most likely due to chronic diverticulosis. An underlying mass cannot be excluded and would need correlation with colonoscopy findings. 3. Extensive sigmoid and descending colon diverticulosis without evidence of acute diverticulitis. 4. Multiple uterine fibroids. 5. Upper abdominal ventral hernia containing fat. 6. Cholelithiasis without evidence of cholecystitis.   Electronically Signed   By: Claudie Revering M.D.   On: 11/15/2016 13:27  Assessment:   Plan:  1. Ovarian mass, left U/s images reviewed by me. D/w her that it is a complex left adnexal cyst/mass, but given lack of ascites/free fluid and no to stable lymphadenopathy that low risk for malignancy, but I told her that it still needs to be fully assessed. I told her that given her history of diverticulosis and inflammation that this could also be sequelae from that such as an inclusion cyst. Will get a CA125 and refer to [place] for assessment, given she is a high risk surgical patient and at risk for pelvic adhesive disease and difficult surgery. Pt told that they could recommend surgery, repeat imaging, an MRI, etc and they will go over the various options and best of course of action. Patient amenable to plan.  - CA 125 - Ambulatory referral to Gynecologic Oncology  RTC PRN  Durene Romans MD Attending [facility])

## 2016-11-28 NOTE — Progress Notes (Signed)
Pt here as referral from [facility] to follow up pelvic US results.   "Large mildly septated left ovarian cyst measuring 7.4 cm in greatest dimension. Given the patient's age and lack of recent menstrual cycle, follow-up MRI is recommended for further evaluation. Surgical consultation may also be pursued."

## 2016-11-29 LAB — CA 125: CANCER ANTIGEN (CA) 125: 14.4 U/mL (ref 0.0–38.1)

## 2016-12-02 ENCOUNTER — Telehealth: Payer: Self-pay | Admitting: Gynecology

## 2016-12-02 NOTE — Telephone Encounter (Signed)
Appt has been schedule for the pt to see Dr. Fermin Schwab on 8/6 at 145pm. Pt aware to arrive 15 minutes early. Address and insurance verified.

## 2016-12-05 ENCOUNTER — Encounter: Payer: Self-pay | Admitting: Gynecology

## 2016-12-05 ENCOUNTER — Encounter: Payer: BC Managed Care – PPO | Admitting: Obstetrics and Gynecology

## 2016-12-05 ENCOUNTER — Ambulatory Visit: Payer: BC Managed Care – PPO | Attending: Gynecology | Admitting: Gynecology

## 2016-12-05 VITALS — BP 184/109 | HR 78 | Temp 98.6°F | Resp 16 | Ht 61.25 in | Wt 256.8 lb

## 2016-12-05 DIAGNOSIS — E669 Obesity, unspecified: Secondary | ICD-10-CM | POA: Diagnosis not present

## 2016-12-05 DIAGNOSIS — R1032 Left lower quadrant pain: Secondary | ICD-10-CM | POA: Diagnosis not present

## 2016-12-05 DIAGNOSIS — Z6841 Body Mass Index (BMI) 40.0 and over, adult: Secondary | ICD-10-CM | POA: Diagnosis not present

## 2016-12-05 DIAGNOSIS — N838 Other noninflammatory disorders of ovary, fallopian tube and broad ligament: Secondary | ICD-10-CM

## 2016-12-05 DIAGNOSIS — R110 Nausea: Secondary | ICD-10-CM | POA: Diagnosis not present

## 2016-12-05 DIAGNOSIS — Z86718 Personal history of other venous thrombosis and embolism: Secondary | ICD-10-CM | POA: Insufficient documentation

## 2016-12-05 DIAGNOSIS — K589 Irritable bowel syndrome without diarrhea: Secondary | ICD-10-CM | POA: Insufficient documentation

## 2016-12-05 DIAGNOSIS — E7800 Pure hypercholesterolemia, unspecified: Secondary | ICD-10-CM | POA: Diagnosis not present

## 2016-12-05 DIAGNOSIS — Z8719 Personal history of other diseases of the digestive system: Secondary | ICD-10-CM | POA: Diagnosis not present

## 2016-12-05 DIAGNOSIS — Z880 Allergy status to penicillin: Secondary | ICD-10-CM | POA: Insufficient documentation

## 2016-12-05 DIAGNOSIS — Z888 Allergy status to other drugs, medicaments and biological substances status: Secondary | ICD-10-CM | POA: Diagnosis not present

## 2016-12-05 DIAGNOSIS — Z813 Family history of other psychoactive substance abuse and dependence: Secondary | ICD-10-CM | POA: Insufficient documentation

## 2016-12-05 DIAGNOSIS — Z79899 Other long term (current) drug therapy: Secondary | ICD-10-CM | POA: Insufficient documentation

## 2016-12-05 DIAGNOSIS — Z8249 Family history of ischemic heart disease and other diseases of the circulatory system: Secondary | ICD-10-CM | POA: Insufficient documentation

## 2016-12-05 DIAGNOSIS — H409 Unspecified glaucoma: Secondary | ICD-10-CM | POA: Insufficient documentation

## 2016-12-05 DIAGNOSIS — N839 Noninflammatory disorder of ovary, fallopian tube and broad ligament, unspecified: Secondary | ICD-10-CM | POA: Insufficient documentation

## 2016-12-05 DIAGNOSIS — Z87891 Personal history of nicotine dependence: Secondary | ICD-10-CM | POA: Insufficient documentation

## 2016-12-05 DIAGNOSIS — Z8042 Family history of malignant neoplasm of prostate: Secondary | ICD-10-CM | POA: Insufficient documentation

## 2016-12-05 DIAGNOSIS — C562 Malignant neoplasm of left ovary: Secondary | ICD-10-CM

## 2016-12-05 DIAGNOSIS — I10 Essential (primary) hypertension: Secondary | ICD-10-CM | POA: Insufficient documentation

## 2016-12-05 DIAGNOSIS — Z91041 Radiographic dye allergy status: Secondary | ICD-10-CM | POA: Diagnosis not present

## 2016-12-05 NOTE — Patient Instructions (Signed)
Preparing for your Surgery  Plan for surgery on December 27, 2016 with Dr. Everitt Amber at [facility] will be scheduled for a robotic assisted removal of one or both of the ovaries and removal of both fallopian tubes.  Pre-operative Testing -You will receive a phone call from presurgical testing at [facility] to arrange for a pre-operative testing appointment before your surgery.  This appointment normally occurs one to two weeks before your scheduled surgery.   -Bring your insurance card, copy of an advanced directive if applicable, medication list  -At that visit, you will be asked to sign a consent for a possible blood transfusion in case a transfusion becomes necessary during surgery.  The need for a blood transfusion is rare but having consent is a necessary part of your care.     -You should not be taking blood thinners or aspirin at least ten days prior to surgery unless instructed by your surgeon.  Day Before Surgery at [place] will be asked to take in a light diet the day before surgery.  Avoid carbonated beverages.  You will be advised to have nothing to eat or drink after midnight the evening before.     Eat a light diet the day before surgery.  Examples including soups, broths, toast, yogurt, mashed potatoes.  Things to avoid include carbonated beverages (fizzy beverages), raw fruits and raw vegetables, or beans.    If your bowels are filled with gas, your surgeon will have difficulty visualizing your pelvic organs which increases your surgical risks.  Your role in recovery Your role is to become active as soon as directed by your doctor, while still giving yourself time to heal.  Rest when you feel tired. You will be asked to do the following in order to speed your recovery:  - Cough and breathe deeply. This helps toclear and expand your lungs and can prevent pneumonia. You may be given a spirometer to practice deep breathing. A staff member  will show you how to use the spirometer. - Do mild physical activity. Walking or moving your legs help your circulation and body functions return to normal. A staff member will help you when you try to walk and will provide you with simple exercises. Do not try to get up or walk alone the first time. - Actively manage your pain. Managing your pain lets you move in comfort. We will ask you to rate your pain on a scale of zero to 10. It is your responsibility to tell your doctor or nurse where and how much you hurt so your pain can be treated.  Special Considerations -If you are diabetic, you may be placed on insulin after surgery to have closer control over your blood sugars to promote healing and recovery.  This does not mean that you will be discharged on insulin.  If applicable, your oral antidiabetics will be resumed when you are tolerating a solid diet.  -Your final pathology results from surgery should be available by the Friday after surgery and the results will be relayed to you when available.   Blood Transfusion Information WHAT IS A BLOOD TRANSFUSION? A transfusion is the replacement of blood or some of its parts. Blood is made up of multiple cells which provide different functions.  Red blood cells carry oxygen and are used for blood loss replacement.  White blood cells fight against infection.  Platelets control bleeding.  Plasma helps clot blood.  Other blood products are available  for specialized needs, such as hemophilia or other clotting disorders. BEFORE THE TRANSFUSION  Who gives blood for transfusions?   You may be able to donate blood to be used at a later date on yourself (autologous donation).  Relatives can be asked to donate blood. This is generally not any safer than if you have received blood from a stranger. The same precautions are taken to ensure safety when a relative's blood is donated.  Healthy volunteers who are fully evaluated to make sure their blood  is safe. This is blood bank blood. Transfusion therapy is the safest it has ever been in the practice of medicine. Before blood is taken from a donor, a complete history is taken to make sure that person has no history of diseases nor engages in risky social behavior (examples are intravenous drug use or sexual activity with multiple partners). The donor's travel history is screened to minimize risk of transmitting infections, such as malaria. The donated blood is tested for signs of infectious diseases, such as HIV and hepatitis. The blood is then tested to be sure it is compatible with you in order to minimize the chance of a transfusion reaction. If you or a relative donates blood, this is often done in anticipation of surgery and is not appropriate for emergency situations. It takes many days to process the donated blood. RISKS AND COMPLICATIONS Although transfusion therapy is very safe and saves many lives, the main dangers of transfusion include:   Getting an infectious disease.  Developing a transfusion reaction. This is an allergic reaction to something in the blood you were given. Every precaution is taken to prevent this. The decision to have a blood transfusion has been considered carefully by your caregiver before blood is given. Blood is not given unless the benefits outweigh the risks.

## 2016-12-05 NOTE — Progress Notes (Signed)
H&P   Leah Peterson 60 y.o. female  Chief Complaint  Patient presents with  . 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 Certainly this may also be associated with her diverticulitis. However the very abrupt onset and resolution of the pain is suggestive of an ovarian torsion.  While the ultrasound characteristics and CA-125 was just a benign mass recommend that it be removed in hopes of improving the patient's pain. I believe this can be done through minimally invasive procedure and will schedule with Dr. Rossi. I do not believe hysterectomy is indicated. We did discuss the possibility removing the other ovary and the patient is undecided at this time.  The risks of surgery including hemorrhage infection injury to adjacent viscera and thrombolic complications were discussed. All questions are answered.  History of present illness: 60-year-old [female seen in consultation request of Dr. Pickens regarding management of a left ovarian complex cystic mass. The patient has had several months of left lower quadrant pain and discomfort. She describes the pain as acute onset in the left lower quadrant resulting in nausea and vomiting and some diarrhea. She says she is lost a considerable amount of weight because she been unable to eat secondary to nausea.  A CT scan and pelvic ultrasound was obtained showing a left adnexal mass measuring 5.4 x 4.7 x 7.1 cm. There were septations but no evidence of nodularity or free fluid. CA-125 value 4.4 units per mL.  Patient has a past history of diverticulitis and on CT scan thickening the sigmoid colon is also identified. In 2013 she was hospitalized with what she describes asepsis requiring a PIC line and IV antibiotics for a month. She has had no further episodes of that severe problem. She has had colonoscopies and upper endoscopies. ROS: 10 point  review of systems negative except as noted in history of present illness. Vitals:  Blood pressure (!) 184/109, pulse 78, temperature 98.6 F (37 C), temperature source Oral, resp. rate 16, height 5' 1.25" (1.556 m), weight 256 lb 12.8 oz (116.5 kg), SpO2 100 %.  Physical Exam: Obese Afro-American female no acute distress although new or the end of the exam she developed left lower quadrant pain and nausea. HEENT is negative  Neck is supple without thyromegaly  Abdomen is obese soft nontender no masses again a megaly ascites or hernias or rebound is noted.  Pelvic exam:  EGBUS vagina bladder urethra are normal  Cervix is normal.  Uterus is difficult to outline secondary to the patient's habitus.  Adnexa without masses or nodularity or tenderness.  Lower extremities reveal moderate to severe varicosities.    Past Medical History:  Diagnosis Date  . Diverticulosis of colon (without mention of hemorrhage)   . DVT (deep venous thrombosis) (HCC) 2015   bilateral posterior tibial veins treated with IVC filter  . Essential hypertension, benign    Blood pressure tends to elevate with MD visits.  . Gallstones   . Glaucoma 09/2016   left sided mainly  . History of diverticulitis   . IBS (irritable bowel syndrome)   . Left knee pain 2012   eval by ortho - bone spurs and DJD, treated with steroid shot which helped temporarily  . Liver abscess 2015   hospitalization for severe septic shock after diverticulitis  . Obesity, unspecified   . Other abnormal glucose   . Pure hypercholesterolemia   .   Transfusion history    '15-"diverticulosis"  . Unspecified vitamin D deficiency    resolved   Past Surgical History:  Procedure Laterality Date  . CESAREAN SECTION     due to BP elevation  . COLONOSCOPY  2000   Diverticuli-Dr. Patterson  . COLONOSCOPY WITH PROPOFOL N/A 06/30/2015   diverticulosis, rpt 10 yrs (Carl E Gessner, MD)  . DRAINAGE ABD/PERITON ABS PERC ([facility] HX)  2015    liver abscess  . ESOPHAGOGASTRODUODENOSCOPY (EGD) WITH PROPOFOL N/A 12/30/2015   WNL (Carl E Gessner, MD)   Current Outpatient Prescriptions  Medication Sig Dispense Refill  . amLODipine (NORVASC) 5 MG tablet Take 1 tablet (5 mg total) by mouth daily. 90 tablet 3  . cetirizine (ZYRTEC) 10 MG tablet Take 10 mg by mouth at bedtime as needed for allergies.     . cholecalciferol (VITAMIN D) 1000 units tablet Take 1,000 Units by mouth daily.    . diphenhydrAMINE (BENADRYL) 50 MG tablet Take one tablet 1 hour prior to CT scan. 1 tablet 0  . hydrochlorothiazide (HYDRODIURIL) 25 MG tablet Take 1 tablet (25 mg total) by mouth daily. 90 tablet 3  . latanoprost (XALATAN) 0.005 % ophthalmic solution Place 0.005 drops into both eyes at bedtime.  0  . Multiple Vitamin (MULTIVITAMIN WITH MINERALS) TABS tablet Take 1 tablet by mouth daily.    . ondansetron (ZOFRAN) 4 MG tablet Take 1 tablet (4 mg total) by mouth every 8 (eight) hours as needed for nausea or vomiting. 30 tablet 1  . dicyclomine (BENTYL) 10 MG capsule Take 1 capsule (10 mg total) by mouth 4 (four) times daily as needed for spasms. 30 capsule 3   No current facility-administered medications for this visit.    Allergies  Allergen Reactions  . Iodinated Diagnostic Agents Itching  . Lisinopril     REACTION: lethargy, cough.  . Penicillins Other (See Comments)    LYMPH NODES SWELL Has patient had a PCN reaction causing immediate rash, facial/tongue/throat swelling, SOB or lightheadedness with hypotension: No Has patient had a PCN reaction causing severe rash involving mucus membranes or skin necrosis: No Has patient had a PCN reaction that required hospitalization No Has patient had a PCN reaction occurring within the last 10 years: No If all of the above answers are "NO", then may proceed with Cephalosporin use.    Social History   Social History  . Marital status: Divorced    Spouse name: N/A  . Number of children: 1  . Years of  education: N/A   Occupational History  . Analyst; Dept of [employer]    Social History Main Topics  . Smoking status: Former Smoker    Years: 3.00    Quit date: 06/15/1977  . Smokeless tobacco: Never Used  . Alcohol use Yes     Comment: wine on occasion   . Drug use: No  . Sexual activity: No     Comment: By choice   Other Topics Concern  . Not on file   Social History Narrative   Caffeine: 2-3 cups coffee, 2 cups sweet tea   Lives with daughter and grand daughter.  No pets.   Divorced; 1 daughter   Occupation: Analyst at [employer]   Involved in town hall of [place].   Activity: granddaughter.  No regular exercise.   Diet: fruits/vegetables daily, drinks a lot of sweet tea   Family History  Problem Relation Age of Onset  . Hypertension Father   . Cancer Father          prostate  . Heart disease Father        CABG x 2  . Cancer Mother        Lung-quit smoking 20 years prior to Dx  . Hypertension Mother   . Drug abuse Brother   . Diabetes Unknown        + family hx  . Stroke Maternal Grandmother   . Sleep apnea Brother        with [employer] estranged  . Colon cancer Neg Hx       Kesley Mullens Clarke-Pearson, MD 12/05/2016, 2:14 PM    

## 2016-12-06 ENCOUNTER — Encounter: Payer: Self-pay | Admitting: Gynecologic Oncology

## 2016-12-16 NOTE — Patient Instructions (Addendum)
Leah Peterson [facility]  12/16/2016   Your procedure is scheduled on: Tuesday 12-27-16  Report to [facility] Main  Entrance Take [place]  elevators to 3rd floor to  [place] at 1100 AM.    Call this number if you have problems the morning of surgery [phone]   Remember: ONLY 1 PERSON MAY GO WITH YOU TO SHORT STAY TO GET  READY MORNING OF [place].  Do not eat food :After Midnight.CLEAR LIQUIDS FROM MIDNIGHT UNTIL 700 AM DAY OF SURGERY, NOTHING BY MOUTH AFTER 700 AM DAY OF SURGERY.   LIGHT DIET DAY BEFORE SURGERY 12-26-16 SEE INSTRUCTIONS BELOW!   Take these medicines the morning of surgery with A SIP OF WATER: AMLODIPINE   (NORVASC)                               You may not have any metal on your body including hair pins and              piercings  Do not wear jewelry, make-up, lotions, powders or perfumes, deodorant             Do not wear nail polish.  Do not shave  48 hours prior to surgery.              Men may shave face and neck.   Do not bring valuables to the hospital. [place].  Contacts, dentures or bridgework may not be worn into surgery.  Leave suitcase in the car. After surgery it may be brought to your room.     Patients discharged the day of surgery will not be allowed to drive home.  Name and phone number of your driver:DAUGHTER [facility]   CELL [phone]  Special Instructions: N/A              Please read over the following fact sheets you were given: _____________________________________________________________________             Eat a light diet the day before surgery.  Examples including soups, broths, toast, yogurt, mashed potatoes.  Things to avoid include carbonated beverages (fizzy beverages), raw fruits and raw vegetables, or beans.   If your bowels are filled with gas, your surgeon will have difficulty visualizing your pelvic organs which increases your surgical  risks.   CLEAR LIQUID DIET   Foods Allowed                                                                     Foods Excluded  Coffee and tea, regular and decaf                             liquids that you cannot  Plain Jell-O in any flavor  see through such as: Fruit ices (not with fruit pulp)                                     milk, soups, orange juice  Iced Popsicles                                    All solid food                                 Cranberry, grape and apple juices Sports drinks like Gatorade Lightly seasoned clear broth or consume(fat free) Sugar, honey syrup  Sample Menu Breakfast                                Lunch                                     Supper Cranberry juice                    Beef broth                            Chicken broth Jell-O                                     Grape juice                           Apple juice Coffee or tea                        Jell-O                                      Popsicle                                                Coffee or tea                        Coffee or tea  _____________________________________________________________________  [facility] - Preparing for Surgery Before surgery, you can play an important role.  Because skin is not sterile, your skin needs to be as free of germs as possible.  You can reduce the number of germs on your skin by washing with CHG (chlorahexidine gluconate) soap before surgery.  CHG is an antiseptic cleaner which kills germs and bonds with the skin to continue killing germs even after washing. Please DO NOT use if you have an allergy to CHG or antibacterial soaps.  If your skin becomes reddened/irritated stop using the CHG and inform your nurse when you arrive at Short Stay. Do not shave (including legs and underarms) for at least 48 hours prior to the first  CHG shower.  You may shave your face/neck. Please follow these  instructions carefully:  1.  Shower with CHG Soap the night before surgery and the  morning of Surgery.  2.  If you choose to wash your hair, wash your hair first as usual with your  normal  shampoo.  3.  After you shampoo, rinse your hair and body thoroughly to remove the  shampoo.                           4.  Use CHG as you would any other liquid soap.  You can apply chg directly  to the skin and wash                       Gently with a scrungie or clean washcloth.  5.  Apply the CHG Soap to your body ONLY FROM THE NECK DOWN.   Do not use on face/ open                           Wound or open sores. Avoid contact with eyes, ears mouth and genitals (private parts).                       Wash face,  Genitals (private parts) with your normal soap.             6.  Wash thoroughly, paying special attention to the area where your surgery  will be performed.  7.  Thoroughly rinse your body with warm water from the neck down.  8.  DO NOT shower/wash with your normal soap after using and rinsing off  the CHG Soap.                9.  Pat yourself dry with a clean towel.            10.  Wear clean pajamas.            11.  Place clean sheets on your bed the night of your first shower and do not  sleep with pets. Day of Surgery : Do not apply any lotions/deodorants the morning of surgery.  Please wear clean clothes to the hospital/surgery center.  FAILURE TO FOLLOW THESE INSTRUCTIONS MAY RESULT IN THE CANCELLATION OF YOUR SURGERY PATIENT SIGNATURE_________________________________  NURSE SIGNATURE__________________________________  ________________________________________________________________________   Adam Phenix  An incentive spirometer is a tool that can help keep your lungs clear and active. This tool measures how well you are filling your lungs with each breath. Taking long deep breaths may help reverse or decrease the chance of developing breathing (pulmonary) problems (especially  infection) following:  A long period of time when you are unable to move or be active. BEFORE THE PROCEDURE   If the spirometer includes an indicator to show your best effort, your nurse or respiratory therapist will set it to a desired goal.  If possible, sit up straight or lean slightly forward. Try not to slouch.  Hold the incentive spirometer in an upright position. INSTRUCTIONS FOR USE  1. Sit on the edge of your bed if possible, or sit up as far as you can in bed or on a chair. 2. Hold the incentive spirometer in an upright position. 3. Breathe out normally. 4. Place the mouthpiece in your mouth and seal your lips tightly around it. 5. Breathe in slowly and as  deeply as possible, raising the piston or the ball toward the top of the column. 6. Hold your breath for 3-5 seconds or for as long as possible. Allow the piston or ball to fall to the bottom of the column. 7. Remove the mouthpiece from your mouth and breathe out normally. 8. Rest for a few seconds and repeat Steps 1 through 7 at least 10 times every 1-2 hours when you are awake. Take your time and take a few normal breaths between deep breaths. 9. The spirometer may include an indicator to show your best effort. Use the indicator as a goal to work toward during each repetition. 10. After each set of 10 deep breaths, practice coughing to be sure your lungs are clear. If you have an incision (the cut made at the time of surgery), support your incision when coughing by placing a pillow or rolled up towels firmly against it. Once you are able to get out of bed, walk around indoors and cough well. You may stop using the incentive spirometer when instructed by your caregiver.  RISKS AND COMPLICATIONS  Take your time so you do not get dizzy or light-headed.  If you are in pain, you may need to take or ask for pain medication before doing incentive spirometry. It is harder to take a deep breath if you are having pain. AFTER  USE  Rest and breathe slowly and easily.  It can be helpful to keep track of a log of your progress. Your caregiver can provide you with a simple table to help with this. If you are using the spirometer at home, follow these instructions: [place] IF:   You are having difficultly using the spirometer.  You have trouble using the spirometer as often as instructed.  Your pain medication is not giving enough relief while using the spirometer.  You develop fever of 100.5 F (38.1 C) or higher. SEEK IMMEDIATE MEDICAL CARE IF:   You cough up bloody sputum that had not been present before.  You develop fever of 102 F (38.9 C) or greater.  You develop worsening pain at or near the incision site. MAKE SURE YOU:   Understand these instructions.  Will watch your condition.  Will get help right away if you are not doing well or get worse. Document Released: 08/29/2006 Document Revised: 07/11/2011 Document Reviewed: 10/30/2006 ExitCare Patient Information 2014 ExitCare, [place].   ________________________________________________________________________  WHAT IS A BLOOD TRANSFUSION? Blood Transfusion Information  A transfusion is the replacement of blood or some of its parts. Blood is made up of multiple cells which provide different functions.  Red blood cells carry oxygen and are used for blood loss replacement.  White blood cells fight against infection.  Platelets control bleeding.  Plasma helps clot blood.  Other blood products are available for specialized needs, such as hemophilia or other clotting disorders. BEFORE THE TRANSFUSION  Who gives blood for transfusions?   Healthy volunteers who are fully evaluated to make sure their blood is safe. This is blood bank blood. Transfusion therapy is the safest it has ever been in the practice of medicine. Before blood is taken from a donor, a complete history is taken to make sure that person has no history of diseases  nor engages in risky social behavior (examples are intravenous drug use or sexual activity with multiple partners). The donor's travel history is screened to minimize risk of transmitting infections, such as malaria. The donated blood is tested for signs of infectious  diseases, such as HIV and hepatitis. The blood is then tested to be sure it is compatible with you in order to minimize the chance of a transfusion reaction. If you or a relative donates blood, this is often done in anticipation of surgery and is not appropriate for emergency situations. It takes many days to process the donated blood. RISKS AND COMPLICATIONS Although transfusion therapy is very safe and saves many lives, the main dangers of transfusion include:   Getting an infectious disease.  Developing a transfusion reaction. This is an allergic reaction to something in the blood you were given. Every precaution is taken to prevent this. The decision to have a blood transfusion has been considered carefully by your caregiver before blood is given. Blood is not given unless the benefits outweigh the risks. AFTER THE TRANSFUSION  Right after receiving a blood transfusion, you will usually feel much better and more energetic. This is especially true if your red blood cells have gotten low (anemic). The transfusion raises the level of the red blood cells which carry oxygen, and this usually causes an energy increase.  The nurse administering the transfusion will monitor you carefully for complications. HOME CARE INSTRUCTIONS  No special instructions are needed after a transfusion. You may find your energy is better. Speak with your caregiver about any limitations on activity for underlying diseases you may have. SEEK MEDICAL CARE IF:   Your condition is not improving after your transfusion.  You develop redness or irritation at the intravenous (IV) site. SEEK IMMEDIATE MEDICAL CARE IF:  Any of the following symptoms occur over the  next 12 hours:  Shaking chills.  You have a temperature by mouth above 102 F (38.9 C), not controlled by medicine.  Chest, back, or muscle pain.  People around you feel you are not acting correctly or are confused.  Shortness of breath or difficulty breathing.  Dizziness and fainting.  You get a rash or develop hives.  You have a decrease in urine output.  Your urine turns a dark color or changes to pink, red, or brown. Any of the following symptoms occur over the next 10 days:  You have a temperature by mouth above 102 F (38.9 C), not controlled by medicine.  Shortness of breath.  Weakness after normal activity.  The white part of the eye turns yellow (jaundice).  You have a decrease in the amount of urine or are urinating less often.  Your urine turns a dark color or changes to pink, red, or brown. Document Released: 04/15/2000 Document Revised: 07/11/2011 Document Reviewed: 12/03/2007 [employer] Patient Information 2014 [place], [place].  _______________________________________________________________________

## 2016-12-21 ENCOUNTER — Encounter (HOSPITAL_COMMUNITY)
Admission: RE | Admit: 2016-12-21 | Discharge: 2016-12-21 | Disposition: A | Discharge status: Home / Self Care | Payer: BC Managed Care – PPO | Source: Ambulatory Visit | Attending: Gynecologic Oncology | Admitting: Gynecologic Oncology

## 2016-12-21 ENCOUNTER — Encounter (HOSPITAL_COMMUNITY): Payer: Self-pay

## 2016-12-21 DIAGNOSIS — Z01818 Encounter for other preprocedural examination: Secondary | ICD-10-CM | POA: Insufficient documentation

## 2016-12-21 DIAGNOSIS — R9431 Abnormal electrocardiogram [ECG] [EKG]: Secondary | ICD-10-CM | POA: Insufficient documentation

## 2016-12-21 DIAGNOSIS — N839 Noninflammatory disorder of ovary, fallopian tube and broad ligament, unspecified: Secondary | ICD-10-CM | POA: Insufficient documentation

## 2016-12-21 DIAGNOSIS — I10 Essential (primary) hypertension: Secondary | ICD-10-CM | POA: Diagnosis not present

## 2016-12-21 HISTORY — DX: Unspecified osteoarthritis, unspecified site: M19.90

## 2016-12-21 HISTORY — DX: Other noninflammatory disorders of ovary, fallopian tube and broad ligament: N83.8

## 2016-12-21 LAB — COMPREHENSIVE METABOLIC PANEL
ALT: 11 U/L — ABNORMAL LOW (ref 14–54)
AST: 15 U/L (ref 15–41)
Albumin: 3.9 g/dL (ref 3.5–5.0)
Alkaline Phosphatase: 48 U/L (ref 38–126)
Anion gap: 7 (ref 5–15)
BILIRUBIN TOTAL: 0.6 mg/dL (ref 0.3–1.2)
BUN: 12 mg/dL (ref 6–20)
CO2: 31 mmol/L (ref 22–32)
CREATININE: 1.11 mg/dL — AB (ref 0.44–1.00)
Calcium: 9.3 mg/dL (ref 8.9–10.3)
Chloride: 103 mmol/L (ref 101–111)
GFR, EST NON AFRICAN AMERICAN: 54 mL/min — AB (ref >60)
Glucose, Bld: 103 mg/dL — ABNORMAL HIGH (ref 65–99)
POTASSIUM: 3.2 mmol/L — AB (ref 3.5–5.1)
Sodium: 141 mmol/L (ref 135–145)
TOTAL PROTEIN: 7.6 g/dL (ref 6.5–8.1)

## 2016-12-21 LAB — CBC
HEMATOCRIT: 38.3 % (ref 36.0–46.0)
Hemoglobin: 12.6 g/dL (ref 12.0–15.0)
MCH: 28.1 pg (ref 26.0–34.0)
MCHC: 32.9 g/dL (ref 30.0–36.0)
MCV: 85.5 fL (ref 78.0–100.0)
PLATELETS: 229 10*3/uL (ref 150–400)
RBC: 4.48 MIL/uL (ref 3.87–5.11)
RDW: 13.4 % (ref 11.5–15.5)
WBC: 4.3 10*3/uL (ref 4.0–10.5)

## 2016-12-21 LAB — URINALYSIS, ROUTINE W REFLEX MICROSCOPIC
Bilirubin Urine: NEGATIVE
Glucose, UA: NEGATIVE mg/dL
KETONES UR: NEGATIVE mg/dL
Nitrite: NEGATIVE
PROTEIN: NEGATIVE mg/dL
Specific Gravity, Urine: 1.018 (ref 1.005–1.030)
pH: 5 (ref 5.0–8.0)

## 2016-12-21 LAB — ABO/RH: ABO/RH(D): B POS

## 2016-12-22 ENCOUNTER — Encounter (HOSPITAL_COMMUNITY): Payer: Self-pay

## 2016-12-22 NOTE — Progress Notes (Signed)
cmet and ua results routed to melissa [facility] np epic inbasket 

## 2016-12-23 ENCOUNTER — Encounter (HOSPITAL_COMMUNITY): Payer: Self-pay

## 2016-12-27 ENCOUNTER — Ambulatory Visit (HOSPITAL_COMMUNITY): Payer: BC Managed Care – PPO | Admitting: Certified Registered Nurse Anesthetist

## 2016-12-27 ENCOUNTER — Ambulatory Visit (HOSPITAL_COMMUNITY)
Admission: RE | Admit: 2016-12-27 | Discharge: 2016-12-27 | Disposition: A | Discharge status: Home / Self Care | Payer: BC Managed Care – PPO | Source: Ambulatory Visit | Attending: Gynecologic Oncology | Admitting: Gynecologic Oncology

## 2016-12-27 ENCOUNTER — Encounter (HOSPITAL_COMMUNITY)
Admission: RE | Disposition: A | Discharge status: Home / Self Care | Payer: Self-pay | Source: Ambulatory Visit | Attending: Gynecologic Oncology

## 2016-12-27 ENCOUNTER — Encounter (HOSPITAL_COMMUNITY): Payer: Self-pay | Admitting: *Deleted

## 2016-12-27 DIAGNOSIS — Z880 Allergy status to penicillin: Secondary | ICD-10-CM | POA: Insufficient documentation

## 2016-12-27 DIAGNOSIS — K589 Irritable bowel syndrome without diarrhea: Secondary | ICD-10-CM | POA: Insufficient documentation

## 2016-12-27 DIAGNOSIS — N7011 Chronic salpingitis: Secondary | ICD-10-CM | POA: Diagnosis not present

## 2016-12-27 DIAGNOSIS — Z87891 Personal history of nicotine dependence: Secondary | ICD-10-CM | POA: Insufficient documentation

## 2016-12-27 DIAGNOSIS — Z91041 Radiographic dye allergy status: Secondary | ICD-10-CM | POA: Diagnosis not present

## 2016-12-27 DIAGNOSIS — H409 Unspecified glaucoma: Secondary | ICD-10-CM | POA: Insufficient documentation

## 2016-12-27 DIAGNOSIS — Z8719 Personal history of other diseases of the digestive system: Secondary | ICD-10-CM | POA: Diagnosis not present

## 2016-12-27 DIAGNOSIS — Z6841 Body Mass Index (BMI) 40.0 and over, adult: Secondary | ICD-10-CM | POA: Insufficient documentation

## 2016-12-27 DIAGNOSIS — N838 Other noninflammatory disorders of ovary, fallopian tube and broad ligament: Secondary | ICD-10-CM | POA: Diagnosis not present

## 2016-12-27 DIAGNOSIS — Z86718 Personal history of other venous thrombosis and embolism: Secondary | ICD-10-CM | POA: Insufficient documentation

## 2016-12-27 DIAGNOSIS — I10 Essential (primary) hypertension: Secondary | ICD-10-CM | POA: Diagnosis not present

## 2016-12-27 DIAGNOSIS — Z888 Allergy status to other drugs, medicaments and biological substances status: Secondary | ICD-10-CM | POA: Insufficient documentation

## 2016-12-27 DIAGNOSIS — I8393 Asymptomatic varicose veins of bilateral lower extremities: Secondary | ICD-10-CM | POA: Diagnosis not present

## 2016-12-27 DIAGNOSIS — Z79899 Other long term (current) drug therapy: Secondary | ICD-10-CM | POA: Diagnosis not present

## 2016-12-27 DIAGNOSIS — N83202 Unspecified ovarian cyst, left side: Secondary | ICD-10-CM | POA: Diagnosis not present

## 2016-12-27 HISTORY — PX: ROBOTIC ASSISTED BILATERAL SALPINGO OOPHERECTOMY: SHX6078

## 2016-12-27 LAB — TYPE AND SCREEN
ABO/RH(D): B POS
ANTIBODY SCREEN: NEGATIVE

## 2016-12-27 SURGERY — SALPINGO-OOPHORECTOMY, BILATERAL, ROBOT-ASSISTED
Anesthesia: General | Laterality: Left

## 2016-12-27 MED ORDER — SODIUM CHLORIDE 0.9% FLUSH: 3.0000 mL | INTRAVENOUS | Status: DC | PRN

## 2016-12-27 MED ORDER — SODIUM CHLORIDE 0.9% FLUSH: 3.0000 mL | Freq: Two times a day (BID) | INTRAVENOUS | Status: DC

## 2016-12-27 MED ORDER — ACETAMINOPHEN 650 MG RE SUPP
650.0000 mg | RECTAL | Status: DC | PRN
  Filled 2016-12-27: qty 1

## 2016-12-27 MED ORDER — SODIUM CHLORIDE 0.9 % IV SOLN: 250.0000 mL | INTRAVENOUS | Status: DC | PRN

## 2016-12-27 MED ORDER — OXYCODONE HCL 5 MG PO TABS
5.0000 mg | ORAL_TABLET | ORAL | Status: DC | PRN
  Administered 2016-12-27: 5 mg via ORAL

## 2016-12-27 MED ORDER — ROCURONIUM BROMIDE 10 MG/ML (PF) SYRINGE
PREFILLED_SYRINGE | INTRAVENOUS | Status: DC | PRN
  Administered 2016-12-27: 20 mg via INTRAVENOUS
  Administered 2016-12-27: 50 mg via INTRAVENOUS

## 2016-12-27 MED ORDER — OXYCODONE HCL 5 MG/5ML PO SOLN
5.0000 mg | Freq: Once | ORAL | Status: DC | PRN
  Filled 2016-12-27: qty 5

## 2016-12-27 MED ORDER — OXYCODONE-ACETAMINOPHEN 5-325 MG PO TABS: 1.0000 | ORAL_TABLET | ORAL | 0 refills | Status: DC | PRN

## 2016-12-27 MED ORDER — ONDANSETRON HCL 4 MG/2ML IJ SOLN
INTRAMUSCULAR | Status: AC
Start: 2016-12-27 — End: 2016-12-27
  Filled 2016-12-27: qty 2

## 2016-12-27 MED ORDER — MIDAZOLAM HCL 5 MG/5ML IJ SOLN
INTRAMUSCULAR | Status: DC | PRN
  Administered 2016-12-27: 2 mg via INTRAVENOUS

## 2016-12-27 MED ORDER — DEXAMETHASONE SODIUM PHOSPHATE 10 MG/ML IJ SOLN
INTRAMUSCULAR | Status: AC
  Filled 2016-12-27: qty 1

## 2016-12-27 MED ORDER — PROMETHAZINE HCL 25 MG/ML IJ SOLN: 6.2500 mg | INTRAMUSCULAR | Status: DC | PRN

## 2016-12-27 MED ORDER — PROPOFOL 10 MG/ML IV BOLUS
INTRAVENOUS | Status: AC
  Filled 2016-12-27: qty 20

## 2016-12-27 MED ORDER — LACTATED RINGERS IR SOLN
Status: DC | PRN
  Administered 2016-12-27: 1000 mL

## 2016-12-27 MED ORDER — SUCCINYLCHOLINE CHLORIDE 200 MG/10ML IV SOSY
PREFILLED_SYRINGE | INTRAVENOUS | Status: AC
  Filled 2016-12-27: qty 10

## 2016-12-27 MED ORDER — FENTANYL CITRATE (PF) 100 MCG/2ML IJ SOLN
INTRAMUSCULAR | Status: DC | PRN
  Administered 2016-12-27 (×2): 50 ug via INTRAVENOUS
  Administered 2016-12-27: 100 ug via INTRAVENOUS

## 2016-12-27 MED ORDER — MEPERIDINE HCL 50 MG/ML IJ SOLN: 6.2500 mg | INTRAMUSCULAR | Status: DC | PRN

## 2016-12-27 MED ORDER — DEXAMETHASONE SODIUM PHOSPHATE 10 MG/ML IJ SOLN
INTRAMUSCULAR | Status: DC | PRN
  Administered 2016-12-27: 10 mg via INTRAVENOUS

## 2016-12-27 MED ORDER — LACTATED RINGERS IV SOLN
INTRAVENOUS | Status: DC
  Administered 2016-12-27 (×2): via INTRAVENOUS

## 2016-12-27 MED ORDER — STERILE WATER FOR IRRIGATION IR SOLN
Status: DC | PRN
Start: 2016-12-27 — End: 2016-12-27
  Administered 2016-12-27: 1000 mL

## 2016-12-27 MED ORDER — MORPHINE SULFATE (PF) 4 MG/ML IV SOLN: 2.0000 mg | INTRAVENOUS | Status: DC | PRN

## 2016-12-27 MED ORDER — PROPOFOL 10 MG/ML IV BOLUS
INTRAVENOUS | Status: DC | PRN
  Administered 2016-12-27: 150 mg via INTRAVENOUS

## 2016-12-27 MED ORDER — HYDROMORPHONE HCL-NACL 0.5-0.9 MG/ML-% IV SOSY
PREFILLED_SYRINGE | INTRAVENOUS | Status: DC
Start: 2016-12-27 — End: 2016-12-27
  Filled 2016-12-27: qty 4

## 2016-12-27 MED ORDER — LIDOCAINE 2% (20 MG/ML) 5 ML SYRINGE
INTRAMUSCULAR | Status: AC
  Filled 2016-12-27: qty 5

## 2016-12-27 MED ORDER — LIDOCAINE 2% (20 MG/ML) 5 ML SYRINGE
INTRAMUSCULAR | Status: DC | PRN
  Administered 2016-12-27: 60 mg via INTRAVENOUS

## 2016-12-27 MED ORDER — FENTANYL CITRATE (PF) 250 MCG/5ML IJ SOLN
INTRAMUSCULAR | Status: AC
  Filled 2016-12-27: qty 5

## 2016-12-27 MED ORDER — ROCURONIUM BROMIDE 50 MG/5ML IV SOSY
PREFILLED_SYRINGE | INTRAVENOUS | Status: AC
  Filled 2016-12-27: qty 5

## 2016-12-27 MED ORDER — HYDROMORPHONE HCL-NACL 0.5-0.9 MG/ML-% IV SOSY: 0.2500 mg | PREFILLED_SYRINGE | INTRAVENOUS | Status: DC | PRN

## 2016-12-27 MED ORDER — CLINDAMYCIN PHOSPHATE 900 MG/50ML IV SOLN
900.0000 mg | Freq: Once | INTRAVENOUS | Status: AC
  Administered 2016-12-27: 900 mg via INTRAVENOUS
  Filled 2016-12-27: qty 50

## 2016-12-27 MED ORDER — SUCCINYLCHOLINE CHLORIDE 200 MG/10ML IV SOSY
PREFILLED_SYRINGE | INTRAVENOUS | Status: DC | PRN
  Administered 2016-12-27: 140 mg via INTRAVENOUS

## 2016-12-27 MED ORDER — OXYCODONE HCL 5 MG PO TABS
5.0000 mg | ORAL_TABLET | Freq: Once | ORAL | Status: DC | PRN
  Filled 2016-12-27: qty 1

## 2016-12-27 MED ORDER — MIDAZOLAM HCL 2 MG/2ML IJ SOLN
INTRAMUSCULAR | Status: AC
  Filled 2016-12-27: qty 2

## 2016-12-27 MED ORDER — CIPROFLOXACIN IN D5W 400 MG/200ML IV SOLN
400.0000 mg | Freq: Once | INTRAVENOUS | Status: AC
  Administered 2016-12-27: 400 mg via INTRAVENOUS
  Filled 2016-12-27: qty 200

## 2016-12-27 MED ORDER — SUGAMMADEX SODIUM 200 MG/2ML IV SOLN
INTRAVENOUS | Status: DC | PRN
  Administered 2016-12-27: 500 mg via INTRAVENOUS

## 2016-12-27 MED ORDER — ACETAMINOPHEN 325 MG PO TABS: 650.0000 mg | ORAL_TABLET | ORAL | Status: DC | PRN

## 2016-12-27 MED ORDER — ONDANSETRON HCL 4 MG/2ML IJ SOLN
INTRAMUSCULAR | Status: DC | PRN
  Administered 2016-12-27: 4 mg via INTRAVENOUS

## 2016-12-27 MED ORDER — SUGAMMADEX SODIUM 500 MG/5ML IV SOLN
INTRAVENOUS | Status: AC
  Filled 2016-12-27: qty 5

## 2016-12-27 SURGICAL SUPPLY — 46 items
ADH SKN CLS APL DERMABOND .7 (GAUZE/BANDAGES/DRESSINGS) ×2
APL ESCP 34 STRL LF DISP (HEMOSTASIS) ×2
APPLICATOR SURGIFLO ENDO (HEMOSTASIS) ×2 IMPLANT
BAG LAPAROSCOPIC 12 15 PORT 16 (BASKET) IMPLANT
BAG RETRIEVAL 12/15 (BASKET)
BAG SPEC RTRVL LRG 6X4 10 (ENDOMECHANICALS)
BRR ADH 5X3 SEPRAFILM 6 SHT (MISCELLANEOUS) ×2
CHLORAPREP W/TINT 26ML (MISCELLANEOUS) ×5 IMPLANT
COVER BACK TABLE 60X90IN (DRAPES) ×3 IMPLANT
COVER TIP SHEARS 8 DVNC (MISCELLANEOUS) ×2 IMPLANT
COVER TIP SHEARS 8MM DA VINCI (MISCELLANEOUS) ×1
DERMABOND ADVANCED (GAUZE/BANDAGES/DRESSINGS) ×1
DERMABOND ADVANCED .7 DNX12 (GAUZE/BANDAGES/DRESSINGS) ×1 IMPLANT
DRAPE ARM DVNC X/XI (DISPOSABLE) ×8 IMPLANT
DRAPE COLUMN DVNC XI (DISPOSABLE) ×2 IMPLANT
DRAPE DA VINCI XI ARM (DISPOSABLE) ×4
DRAPE DA VINCI XI COLUMN (DISPOSABLE) ×1
DRAPE SHEET LG 3/4 BI-LAMINATE (DRAPES) ×6 IMPLANT
DRAPE SURG IRRIG POUCH 19X23 (DRAPES) ×3 IMPLANT
ELECT REM PT RETURN 15FT ADLT (MISCELLANEOUS) ×3 IMPLANT
GLOVE BIO SURGEON STRL SZ 6 (GLOVE) ×12 IMPLANT
GLOVE BIO SURGEON STRL SZ 6.5 (GLOVE) ×6 IMPLANT
GOWN STRL REUS W/ TWL LRG LVL3 (GOWN DISPOSABLE) ×6 IMPLANT
GOWN STRL REUS W/TWL LRG LVL3 (GOWN DISPOSABLE) ×9
HOLDER FOLEY CATH W/STRAP (MISCELLANEOUS) ×1 IMPLANT
IRRIG SUCT STRYKERFLOW 2 WTIP (MISCELLANEOUS) ×3
IRRIGATION SUCT STRKRFLW 2 WTP (MISCELLANEOUS) ×2 IMPLANT
MANIPULATOR UTERINE 4.5 ZUMI (MISCELLANEOUS) ×2 IMPLANT
OBTURATOR OPTICAL STANDARD 8MM (TROCAR) ×1
OBTURATOR OPTICAL STND 8 DVNC (TROCAR) ×2
OBTURATOR OPTICALSTD 8 DVNC (TROCAR) ×2 IMPLANT
PACK ROBOT GYN CUSTOM WL (TRAY / TRAY PROCEDURE) ×3 IMPLANT
PAD POSITIONING PINK XL (MISCELLANEOUS) ×3 IMPLANT
POUCH SPECIMEN RETRIEVAL 10MM (ENDOMECHANICALS) IMPLANT
SEAL CANN UNIV 5-8 DVNC XI (MISCELLANEOUS) ×8 IMPLANT
SEAL XI 5MM-8MM UNIVERSAL (MISCELLANEOUS) ×4
SEPRAFILM PROCEDURAL PACK 3X5 (MISCELLANEOUS) ×2 IMPLANT
SET TRI-LUMEN FLTR TB AIRSEAL (TUBING) IMPLANT
SOLUTION ELECTROLUBE (MISCELLANEOUS) ×3 IMPLANT
SUT VIC AB 0 CT1 27 (SUTURE)
SUT VIC AB 0 CT1 27XBRD ANTBC (SUTURE) IMPLANT
TOWEL OR NON WOVEN STRL DISP B (DISPOSABLE) ×3 IMPLANT
TRAP SPECIMEN MUCOUS 40CC (MISCELLANEOUS) IMPLANT
TRAY FOLEY W/METER SILVER 16FR (SET/KITS/TRAYS/PACK) ×3 IMPLANT
UNDERPAD 30X30 (UNDERPADS AND DIAPERS) ×3 IMPLANT
WATER STERILE IRR 1000ML POUR (IV SOLUTION) ×1 IMPLANT

## 2016-12-27 NOTE — Anesthesia Postprocedure Evaluation (Signed)
Anesthesia Post Note  Patient: Leah Peterson  Procedure(s) Performed: Procedure(s) (LRB): XI ROBOTIC ASSISTED LYSIS OF ADHESIONS AND LEFT SALPINGECTOMY (Left)     Patient location during evaluation: PACU Anesthesia Type: General Level of consciousness: awake and alert Pain management: pain level controlled Vital Signs Assessment: post-procedure vital signs reviewed and stable Respiratory status: spontaneous breathing, nonlabored ventilation and respiratory function stable Cardiovascular status: blood pressure returned to baseline and stable Postop Assessment: no signs of nausea or vomiting Anesthetic complications: no    Last Vitals:  Vitals:   12/27/16 1715 12/27/16 1728  BP: (!) 154/96 (!) 143/76  Pulse: 61 63  Resp: 20 20  Temp: (!) 36.1 C (!) 36.4 C  SpO2: 95% 94%    Last Pain:  Vitals:   12/27/16 1715  TempSrc:   PainSc: 0-No pain                 Lynda Rainwater

## 2016-12-27 NOTE — H&P (View-Only) (Signed)
H&P   Leah Peterson 61 y.o. female  Chief Complaint  Patient presents with  . 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 Certainly this may also be associated with her diverticulitis. However the very abrupt onset and resolution of the pain is suggestive of an ovarian torsion.  While the ultrasound characteristics and CA-125 was just a benign mass recommend that it be removed in hopes of improving the patient's pain. I believe this can be done through minimally invasive procedure and will schedule with Dr. Denman George. I do not believe hysterectomy is indicated. We did discuss the possibility removing the other ovary and the patient is undecided at this time.  The risks of surgery including hemorrhage infection injury to adjacent viscera and thrombolic complications were discussed. All questions are answered.  History of present illness: 61 year old [female seen in consultation request of Dr. Ilda Basset regarding management of a left ovarian complex cystic mass. The patient has had several months of left lower quadrant pain and discomfort. She describes the pain as acute onset in the left lower quadrant resulting in nausea and vomiting and some diarrhea. She says she is lost a considerable amount of weight because she been unable to eat secondary to nausea.  A CT scan and pelvic ultrasound was obtained showing a left adnexal mass measuring 5.4 x 4.7 x 7.1 cm. There were septations but no evidence of nodularity or free fluid. CA-125 value 4.4 units per mL.  Patient has a past history of diverticulitis and on CT scan thickening the sigmoid colon is also identified. In 2013 she was hospitalized with what she describes asepsis requiring a PIC line and IV antibiotics for a month. She has had no further episodes of that severe problem. She has had colonoscopies and upper endoscopies. ROS: 10 point  review of systems negative except as noted in history of present illness. Vitals:  Blood pressure (!) 184/109, pulse 78, temperature 98.6 F (37 C), temperature source Oral, resp. rate 16, height 5' 1.25" (1.556 m), weight 256 lb 12.8 oz (116.5 kg), SpO2 100 %.  Physical Exam: Obese Afro-American female no acute distress although new or the end of the exam she developed left lower quadrant pain and nausea. HEENT is negative  Neck is supple without thyromegaly  Abdomen is obese soft nontender no masses again a megaly ascites or hernias or rebound is noted.  Pelvic exam:  EGBUS vagina bladder urethra are normal  Cervix is normal.  Uterus is difficult to outline secondary to the patient's habitus.  Adnexa without masses or nodularity or tenderness.  Lower extremities reveal moderate to severe varicosities.    Past Medical History:  Diagnosis Date  . Diverticulosis of colon (without mention of hemorrhage)   . DVT (deep venous thrombosis) ([place]) 2015   bilateral posterior tibial veins treated with IVC filter  . Essential hypertension, benign    Blood pressure tends to elevate with MD visits.  . Gallstones   . Glaucoma 09/2016   left sided mainly  . History of diverticulitis   . IBS (irritable bowel syndrome)   . Left knee pain 2012   eval by ortho - bone spurs and DJD, treated with steroid shot which helped temporarily  . Liver abscess 2015   hospitalization for severe septic shock after diverticulitis  . Obesity, unspecified   . Other abnormal glucose   . Pure hypercholesterolemia   .  Transfusion history    '15-"diverticulosis"  . Unspecified vitamin D deficiency    resolved   Past Surgical History:  Procedure Laterality Date  . CESAREAN SECTION     due to BP elevation  . COLONOSCOPY  2000   Diverticuli-Dr. Sharlett Iles  . COLONOSCOPY WITH PROPOFOL N/A 06/30/2015   diverticulosis, rpt 10 yrs Gatha Mayer, MD)  . DRAINAGE ABD/PERITON ABS PERC ([place] [facility])  2015    liver abscess  . ESOPHAGOGASTRODUODENOSCOPY (EGD) WITH PROPOFOL N/A 12/30/2015   WNL Gatha Mayer, MD)   Current Outpatient Prescriptions  Medication Sig Dispense Refill  . amLODipine (NORVASC) 5 MG tablet Take 1 tablet (5 mg total) by mouth daily. 90 tablet 3  . cetirizine (ZYRTEC) 10 MG tablet Take 10 mg by mouth at bedtime as needed for allergies.     . cholecalciferol (VITAMIN D) 1000 units tablet Take 1,000 Units by mouth daily.    . diphenhydrAMINE (BENADRYL) 50 MG tablet Take one tablet 1 hour prior to CT scan. 1 tablet 0  . hydrochlorothiazide (HYDRODIURIL) 25 MG tablet Take 1 tablet (25 mg total) by mouth daily. 90 tablet 3  . latanoprost (XALATAN) 0.005 % ophthalmic solution Place 0.005 drops into both eyes at bedtime.  0  . Multiple Vitamin (MULTIVITAMIN WITH MINERALS) TABS tablet Take 1 tablet by mouth daily.    . ondansetron (ZOFRAN) 4 MG tablet Take 1 tablet (4 mg total) by mouth every 8 (eight) hours as needed for nausea or vomiting. 30 tablet 1  . dicyclomine (BENTYL) 10 MG capsule Take 1 capsule (10 mg total) by mouth 4 (four) times daily as needed for spasms. 30 capsule 3   No current facility-administered medications for this visit.    Allergies  Allergen Reactions  . Iodinated Diagnostic Agents Itching  . Lisinopril     REACTION: lethargy, cough.  . Penicillins Other (See Comments)    LYMPH NODES SWELL Has patient had a PCN reaction causing immediate rash, facial/tongue/throat swelling, SOB or lightheadedness with hypotension: No Has patient had a PCN reaction causing severe rash involving mucus membranes or skin necrosis: No Has patient had a PCN reaction that required hospitalization No Has patient had a PCN reaction occurring within the last 10 years: No If all of the above answers are "NO", then may proceed with Cephalosporin use.    Social History   Social History  . Marital status: Divorced    Spouse name: N/A  . Number of children: 1  . Years of  education: N/A   Occupational History  . Analyst; [employer]    Social History Main Topics  . Smoking status: Former Smoker    Years: 3.00    Quit date: 06/15/1977  . Smokeless tobacco: Never Used  . Alcohol use Yes     Comment: wine on occasion   . Drug use: No  . Sexual activity: No     Comment: By choice   Other Topics Concern  . Not on file   Social History Narrative   Caffeine: 2-3 cups coffee, 2 cups sweet tea   Lives with daughter and grand daughter.  No pets.   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   Activity: granddaughter.  No regular exercise.   Diet: fruits/vegetables daily, drinks a lot of sweet tea   Family History  Problem Relation Age of Onset  . Hypertension Father   . Cancer Father  prostate  . Heart disease Father        CABG x 2  . Cancer Mother        Lung-quit smoking 20 years prior to Dx  . Hypertension Mother   . Drug abuse Brother   . Diabetes Unknown        + family hx  . Stroke Maternal Grandmother   . Sleep apnea Brother        with [facility] estranged  . Colon cancer Neg Hx       Marti Sleigh, MD 12/05/2016, 2:14 PM

## 2016-12-27 NOTE — Op Note (Signed)
OPERATIVE NOTE  Date: 12/27/16  Preoperative Diagnosis: left ovarian cyst   Postoperative Diagnosis:  same  Procedure(s) Performed: Robotic-assisted laparoscopic left salpingectomy, lysis of adhesions, partial omentectomy  Surgeon: Everitt Amber, M.D.  Assistant Surgeon: Lahoma Crocker M.D. (an MD assistant was necessary for tissue manipulation, management of robotic instrumentation, retraction and positioning due to the complexity of the case and hospital policies).   Anesthesia: Gen. endotracheal.  Specimens: left fallopian tube, washings, omentum  Estimated Blood Loss: 50 mL. Blood Replacement: None  Complications: none  Indication for Procedure:  The patient has left lower quadrant pain and pain with defecation, diarrhea. History of perforated diverticula disease, fistula, abscesses.   Operative Findings: fibrotic edematous sigmoid colon, left ovary densely adherent, confluent with sigmoid colon. 6cm left fallopian tube cyst (hydrosalpinx). Normal right tube and ovary. 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   Procedure: The patient's taken to the operating room and placed under general endotracheal anesthesia testing difficulty. She is placed in a dorsolithotomy position and cervical acromial pad was placed. The arms were tucked with care taken to pad the olecranon process. And prepped and draped in usual sterile fashion. A uterine manipulator (zumi) was placed vaginally. A 78mm incision was made in the left upper quadrant palmer's point and a 5 mm Optiview trocar used to enter the abdomen under direct visualization. With entry into the abdomen and then maintenance of 15 mm of mercury the patient was placed in Trendelenburg position. An incision was made in the umbilicus and a 62IW trochar was placed through this site. Two incisions were made lateral to the umbilical incision in the left and right abdomen measuring 92mm. These incisions were made  approximately 10 cm lateral to the umbilical incision. 8 mm robotic trochars were inserted.  For 15 minutes sharp adhesiolysis was performed to free the omental adhesions from the anterior abdominal wall. The robot was docked.  The distal omentum that had been adherent to the anterior abdominal wall was resected as it was bleeding and damaged from dissection.   For 60 minutes sharp and blunt adhesiolysis was performed to separate the rectum from posterior uterus and the sigmoid colon from the ovary and tube on the left. It became apparent that the cyst was a hydrosalpinx and inclusion cyst, and benign. Because the ovary was so intimate with the inflamed sigmoid colon, given that the patient's bowel was unprepped and she had expressed concern preop about the possibility of bowel injury or colostomy, we aborted the procedure. It was apparent that the source of her pain was the colon itself, rather than the cyst or the ovary. The abdomen was copiously irrigated and drained and all operative sites inspected and hemostasis was assured. A seprafilm slurry was made and injected into the pelvis.  The robot was undocked.   The ports were all remove. The fascial closure at the umbilical incision and left upper quadrant port was made with 0 Vicryl.  All incisions were closed with a running subcuticular Monocryl suture. Dermabond was applied. Sponge, lap and needle counts were correct x 3.    The patient had sequential compression devices for VTE prophylaxis.         Disposition: PACU -stable         Condition: stable  [name], MD

## 2016-12-27 NOTE — Anesthesia Preprocedure Evaluation (Addendum)
Anesthesia Evaluation    Airway Mallampati: II  TM Distance: >3 FB Neck ROM: Full    Dental no notable dental hx.    Pulmonary former smoker,    Pulmonary exam normal breath sounds clear to auscultation       Cardiovascular hypertension, Normal cardiovascular exam Rhythm:Regular Rate:Normal     Neuro/Psych    GI/Hepatic   Endo/Other    Renal/GU      Musculoskeletal   Abdominal   Peds  Hematology   Anesthesia Other Findings   Reproductive/Obstetrics                                                             Anesthesia Evaluation  Patient identified by MRN, date of birth, ID band Patient awake    Reviewed: Allergy & Precautions, NPO status , Patient's Chart, lab work & pertinent test results  History of Anesthesia Complications Negative for: history of anesthetic complications  Airway Mallampati: II  TM Distance: >3 FB Neck ROM: Full    Dental  (+) Dental Advisory Given   Pulmonary former smoker,    breath sounds clear to auscultation       Cardiovascular hypertension, Pt. on medications (-) angina Rhythm:Regular Rate:Normal  '15 ECHO: EF 55-60%, valves OK   Neuro/Psych negative neurological ROS     GI/Hepatic Neg liver ROS, N/v, but none presently   Endo/Other  Morbid obesity  Renal/GU negative Renal ROS     Musculoskeletal   Abdominal (+) + obese,   Peds  Hematology   Anesthesia Other Findings   Reproductive/Obstetrics                             Anesthesia Physical  Anesthesia Plan  ASA: III  Anesthesia Plan: General   Post-op Pain Management:    Induction: Intravenous  PONV Risk Score and Plan: 3 and Ondansetron, Dexamethasone, Midazolam and Treatment may vary due to age or medical condition  Airway Management Planned: Oral ETT  Additional Equipment:   Intra-op Plan:   Post-operative Plan: Extubation in  OR  Informed Consent: I have reviewed the patients History and Physical, chart, labs and discussed the procedure including the risks, benefits and alternatives for the proposed anesthesia with the patient or authorized representative who has indicated his/her understanding and acceptance.   Dental advisory given  Plan Discussed with: CRNA and Surgeon  Anesthesia Plan Comments: (Plan routine monitors)        Anesthesia Quick Evaluation  Anesthesia Physical Anesthesia Plan  ASA: II  Anesthesia Plan: General   Post-op Pain Management:    Induction: Intravenous  PONV Risk Score and Plan:   Airway Management Planned:   Additional Equipment:   Intra-op Plan:   Post-operative Plan: Extubation in OR  Informed Consent: I have reviewed the patients History and Physical, chart, labs and discussed the procedure including the risks, benefits and alternatives for the proposed anesthesia with the patient or authorized representative who has indicated his/her understanding and acceptance.   Dental advisory given  Plan Discussed with: CRNA  Anesthesia Plan Comments:         Anesthesia Quick Evaluation

## 2016-12-27 NOTE — Discharge Instructions (Signed)
12/27/2016  Return to work: 4 weeks  Activity: 1. Be up and out of the bed during the day.  Take a nap if needed.  You may walk up steps but be careful and use the hand rail.  Stair climbing will tire you more than you think, you may need to stop part way and rest.   2. No lifting or straining for 6 weeks.  3. No driving for 1 weeks.  Do Not drive if you are taking narcotic pain medicine.  4. Shower daily.  Use soap and water on your incision and pat dry; don't rub.   5. No sexual activity and nothing in the vagina for 2 weeks.  Medications:  - Take ibuprofen and tylenol first line for pain control. Take these regularly (every 6 hours) to decrease the build up of pain.  - If necessary, for severe pain not relieved by ibuprofen, take percocet.  - While taking percocet you should take sennakot every night to reduce the likelihood of constipation. If this causes diarrhea, stop its use.  Diet: 1. Low sodium Heart Healthy Diet is recommended.  2. It is safe to use a laxative if you have difficulty moving your bowels.   Wound Care: 1. Keep clean and dry.  Shower daily.  Reasons to call the Doctor:   Fever - Oral temperature greater than 100.4 degrees Fahrenheit  Foul-smelling vaginal discharge  Difficulty urinating  Nausea and vomiting  Increased pain at the site of the incision that is unrelieved with pain medicine.  Difficulty breathing with or without chest pain  New calf pain especially if only on one side  Sudden, continuing increased vaginal bleeding with or without clots.   Follow-up: 1. See Everitt Amber in 3-4 weeks.  Contacts: For questions or concerns you should contact:  Dr. Everitt Amber at [phone] After hours and on week-ends call [phone] and ask to speak to the physician on call for Gynecologic Oncology

## 2016-12-27 NOTE — Anesthesia Preprocedure Evaluation (Signed)
Anesthesia Evaluation  Patient identified by MRN, date of birth, ID band Patient awake    Reviewed: Allergy & Precautions, NPO status , Patient's Chart, lab work & pertinent test results  History of Anesthesia Complications Negative for: history of anesthetic complications  Airway Mallampati: II  TM Distance: >3 FB Neck ROM: Full    Dental  (+) Dental Advisory Given   Pulmonary former smoker,    breath sounds clear to auscultation       Cardiovascular hypertension, Pt. on medications (-) angina Rhythm:Regular Rate:Normal  '15 ECHO: EF 55-60%, valves OK   Neuro/Psych negative neurological ROS     GI/Hepatic Neg liver ROS, N/v, but none presently   Endo/Other  Morbid obesity  Renal/GU negative Renal ROS     Musculoskeletal   Abdominal (+) + obese,   Peds  Hematology   Anesthesia Other Findings   Reproductive/Obstetrics                             Anesthesia Physical  Anesthesia Plan  ASA: III  Anesthesia Plan: General   Post-op Pain Management:    Induction: Intravenous  PONV Risk Score and Plan: 3 and Ondansetron, Dexamethasone, Midazolam and Treatment may vary due to age or medical condition  Airway Management Planned: Oral ETT  Additional Equipment:   Intra-op Plan:   Post-operative Plan: Extubation in OR  Informed Consent: I have reviewed the patients History and Physical, chart, labs and discussed the procedure including the risks, benefits and alternatives for the proposed anesthesia with the patient or authorized representative who has indicated his/her understanding and acceptance.   Dental advisory given  Plan Discussed with: CRNA and Surgeon  Anesthesia Plan Comments: (Plan routine monitors)        Anesthesia Quick Evaluation

## 2016-12-27 NOTE — Anesthesia Procedure Notes (Signed)
Procedure Name: Intubation Date/Time: 12/27/2016 2:18 PM Performed by: Gaston Islam EVETTE Pre-anesthesia Checklist: Patient identified and Suction available Patient Re-evaluated:Patient Re-evaluated prior to induction Preoxygenation: Pre-oxygenation with 100% oxygen Induction Type: IV induction Ventilation: Mask ventilation without difficulty Laryngoscope Size: Mac and 4 Grade View: Grade I Tube size: 7.5 mm Number of attempts: 1 Airway Equipment and Method: Patient positioned with wedge pillow Placement Confirmation: ETT inserted through vocal cords under direct vision,  positive ETCO2,  CO2 detector and breath sounds checked- equal and bilateral Secured at: 22 cm Tube secured with: Tape Dental Injury: Teeth and Oropharynx as per pre-operative assessment

## 2016-12-27 NOTE — Transfer of Care (Signed)
Immediate Anesthesia Transfer of Care Note  Patient: Leah Peterson  Procedure(s) Performed: Procedure(s): XI ROBOTIC ASSISTED LYSIS OF ADHESIONS AND LEFT SALPINGECTOMY (Left)  Patient Location: PACU  Anesthesia Type:General  Level of Consciousness:  sedated, patient cooperative and responds to stimulation  Airway & Oxygen Therapy:Patient Spontanous Breathing and Patient connected to face mask oxgen  Post-op Assessment:  Report given to PACU RN and Post -op Vital signs reviewed and stable  Post vital signs:  Reviewed and stable  Last Vitals:  Vitals:   12/27/16 1102  BP: 123/84  Pulse: 76  Resp: 18  Temp: 36.7 C  SpO2: 71%    Complications: No apparent anesthesia complications

## 2016-12-27 NOTE — Interval H&P Note (Signed)
History and Physical Interval Note:  12/27/2016 1:39 PM  Leah Peterson  has presented today for surgery, with the diagnosis of OVARIAN MASS  The various methods of treatment have been discussed with the patient and family. After consideration of risks, benefits and other options for treatment, the patient has consented to  Procedure(s): XI ROBOTIC ASSISTED BILATERAL SALPINGO OOPHORECTOMY (Bilateral) POSSIBLE STAGING (Bilateral) as a surgical intervention .  The patient's history has been reviewed, patient examined, no change in status, stable for surgery.  I have reviewed the patient's chart and labs.    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  I discussed that based on the appearance, low CA 125, and coexistance of a left ovarian cyst with known diverticulitis, I did not feel that this was a malignancy, but rather a reactive cyst.  I discussed that surgical removal will likely require very extensive lysis of adhesions and possibly colostomy formation.  The patient does not want this. She is willing to "live with the pain" if it is not cancer.  I discussed that we will perform diagnostic laparoscopy. If there are minimal adhesions and it is safe to proceed in an uncomplicated fashion with BSO, we will do so. However, if the adhesions are extensive and the mass appears benign, we will abort after a diagnostic laparoscopy.  Questions were answered to the patient's satisfaction.     [name]

## 2016-12-28 ENCOUNTER — Encounter (HOSPITAL_COMMUNITY): Payer: Self-pay | Admitting: Gynecologic Oncology

## 2016-12-28 ENCOUNTER — Telehealth: Payer: Self-pay | Admitting: *Deleted

## 2016-12-28 NOTE — Telephone Encounter (Signed)
Contacted the patient and gave the post op appt to the patient

## 2016-12-29 ENCOUNTER — Telehealth: Payer: Self-pay | Admitting: *Deleted

## 2016-12-29 NOTE — Telephone Encounter (Signed)
Patient called and moved her post op appt form September 21st to September 17th.

## 2017-01-16 ENCOUNTER — Ambulatory Visit: Payer: BC Managed Care – PPO | Attending: Gynecologic Oncology | Admitting: Gynecologic Oncology

## 2017-01-16 ENCOUNTER — Encounter: Payer: Self-pay | Admitting: Gynecologic Oncology

## 2017-01-16 VITALS — BP 127/76 | HR 70 | Temp 98.3°F | Resp 20 | Wt 246.6 lb

## 2017-01-16 DIAGNOSIS — E559 Vitamin D deficiency, unspecified: Secondary | ICD-10-CM | POA: Diagnosis not present

## 2017-01-16 DIAGNOSIS — I10 Essential (primary) hypertension: Secondary | ICD-10-CM | POA: Insufficient documentation

## 2017-01-16 DIAGNOSIS — Z79891 Long term (current) use of opiate analgesic: Secondary | ICD-10-CM | POA: Diagnosis not present

## 2017-01-16 DIAGNOSIS — N839 Noninflammatory disorder of ovary, fallopian tube and broad ligament, unspecified: Secondary | ICD-10-CM

## 2017-01-16 DIAGNOSIS — Z8742 Personal history of other diseases of the female genital tract: Secondary | ICD-10-CM | POA: Insufficient documentation

## 2017-01-16 DIAGNOSIS — K529 Noninfective gastroenteritis and colitis, unspecified: Secondary | ICD-10-CM

## 2017-01-16 DIAGNOSIS — Z801 Family history of malignant neoplasm of trachea, bronchus and lung: Secondary | ICD-10-CM | POA: Diagnosis not present

## 2017-01-16 DIAGNOSIS — K5792 Diverticulitis of intestine, part unspecified, without perforation or abscess without bleeding: Secondary | ICD-10-CM | POA: Diagnosis not present

## 2017-01-16 DIAGNOSIS — Z9889 Other specified postprocedural states: Secondary | ICD-10-CM | POA: Insufficient documentation

## 2017-01-16 DIAGNOSIS — E669 Obesity, unspecified: Secondary | ICD-10-CM | POA: Diagnosis not present

## 2017-01-16 DIAGNOSIS — Z87891 Personal history of nicotine dependence: Secondary | ICD-10-CM | POA: Diagnosis not present

## 2017-01-16 DIAGNOSIS — E7800 Pure hypercholesterolemia, unspecified: Secondary | ICD-10-CM | POA: Insufficient documentation

## 2017-01-16 DIAGNOSIS — Z90721 Acquired absence of ovaries, unilateral: Secondary | ICD-10-CM | POA: Diagnosis not present

## 2017-01-16 DIAGNOSIS — Z888 Allergy status to other drugs, medicaments and biological substances status: Secondary | ICD-10-CM | POA: Diagnosis not present

## 2017-01-16 DIAGNOSIS — K5732 Diverticulitis of large intestine without perforation or abscess without bleeding: Secondary | ICD-10-CM

## 2017-01-16 DIAGNOSIS — N838 Other noninflammatory disorders of ovary, fallopian tube and broad ligament: Secondary | ICD-10-CM

## 2017-01-16 DIAGNOSIS — Z8249 Family history of ischemic heart disease and other diseases of the circulatory system: Secondary | ICD-10-CM | POA: Diagnosis not present

## 2017-01-16 DIAGNOSIS — Z79899 Other long term (current) drug therapy: Secondary | ICD-10-CM | POA: Diagnosis not present

## 2017-01-16 DIAGNOSIS — K589 Irritable bowel syndrome without diarrhea: Secondary | ICD-10-CM | POA: Diagnosis not present

## 2017-01-16 DIAGNOSIS — Z880 Allergy status to penicillin: Secondary | ICD-10-CM | POA: Insufficient documentation

## 2017-01-16 DIAGNOSIS — Z791 Long term (current) use of non-steroidal anti-inflammatories (NSAID): Secondary | ICD-10-CM | POA: Insufficient documentation

## 2017-01-16 DIAGNOSIS — H409 Unspecified glaucoma: Secondary | ICD-10-CM | POA: Diagnosis not present

## 2017-01-16 DIAGNOSIS — Z833 Family history of diabetes mellitus: Secondary | ICD-10-CM | POA: Diagnosis not present

## 2017-01-16 DIAGNOSIS — Z823 Family history of stroke: Secondary | ICD-10-CM | POA: Diagnosis not present

## 2017-01-16 DIAGNOSIS — Z8042 Family history of malignant neoplasm of prostate: Secondary | ICD-10-CM | POA: Insufficient documentation

## 2017-01-16 HISTORY — DX: Diverticulitis of large intestine without perforation or abscess without bleeding: K57.32

## 2017-01-16 NOTE — Progress Notes (Signed)
H&P   Leah Peterson 61 y.o. female  Chief Complaint  Patient presents with  . Ovarian mass    Follow up post-op    Assessment and plan: S/p robotic assisted left salpingectomy for inclusion cyst secondary to history of diverticulitis.   She does not have malignancy. The tube, rather than the ovary were the source of the cyst. Her pain symptoms are secondary to a fibrotic, inflamed sigmoid colon.  I am recommending that she see Dr Leighton Ruff, the colorectal surgical specialist, to discuss whether or not surgical intervention is necessary. If a hysterectomy or oophorectomy is necessary as part of that procedure due to adhesive involvement I would be happy to assist.  History of present illness: 61 year old [female seen in consultation request of Dr. Ilda Basset regarding management of a left ovarian complex cystic mass. The patient has had several months of left lower quadrant pain and discomfort. She describes the pain as acute onset in the left lower quadrant resulting in nausea and vomiting and some diarrhea. She says she is lost a considerable amount of weight because she been unable to eat secondary to nausea. She feels pain when her "food moves through".  A CT scan and pelvic ultrasound was obtained showing a left adnexal mass measuring 5.4 x 4.7 x 7.1 cm. There were septations but no evidence of nodularity or free fluid. CA-125 value 14.4 units per mL.  Patient has a past history of diverticulitis and on CT scan thickening the sigmoid colon is also identified. In 2013 she was hospitalized with what she describes asepsis requiring a PIC line and IV antibiotics for a month. Review of the records suggests that she had a likely perforated diverticulitis, with multiple intraperitoneal abscesses drained by IR and ICU admission. She has had no further episodes of that severe problem. She has had colonoscopies and upper endoscopies.  Interval Hx:  On 12/27/16 she underwent diagnostic  laparoscopy, robotic left salpingectomy and lysis of adhesions. Intraoperative findings were significant for: fibrotic edematous sigmoid colon, left ovary densely adherent, confluent with sigmoid colon. 6cm left fallopian tube cyst (hydrosalpinx). Normal right tube and ovary. 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 Photos were captured and are on Epic. Because it was apparent that there was no malignancy and because her procedure would require sigmoid colectomy en bloc with oophorectomy,and possibly hysterectomy, we did not proceed (as the patient had requested we not do this). Additionally, she had not received preop bowel prep or appropriate antibiotic prophylaxis.  ROS: 10 point review of systems negative except as noted in history of present illness. Vitals:  Blood pressure (!) 184/109, pulse 78, temperature 98.6 F (37 C), temperature source Oral, resp. rate 16, height 5' 1.25" (1.556 m), weight 256 lb 12.8 oz (116.5 kg), SpO2 100 %.  Physical Exam: Obese Afro-American female no acute distress although new or the end of the exam she developed left lower quadrant pain and nausea. HEENT is negative  Neck is supple without thyromegaly  Abdomen is obese soft nontender no masses again a megaly ascites or hernias or rebound is noted. Incisions are well healed.  Pelvic exam:  EGBUS vagina bladder urethra are normal  Cervix is normal.  Uterus is difficult to outline secondary to the patient's habitus.  Adnexa without masses or nodularity or tenderness.  Lower extremities reveal moderate to severe varicosities.    Past Medical History:  Diagnosis Date  . Arthritis    KNEES  . Diverticulosis of colon (without mention of  hemorrhage)   . DVT (deep venous thrombosis) ([place]) 2015   bilateral posterior tibial veins treated with IVC filter  . Essential hypertension, benign    Blood pressure tends to elevate with MD visits.  . Gallstones   . Glaucoma 09/2016    left sided mainly  . History of diverticulitis   . IBS (irritable bowel syndrome)   . Left knee pain 2012   eval by ortho - bone spurs and DJD, treated with steroid shot which helped temporarily  . Liver abscess 2015   hospitalization for severe septic shock after diverticulitis  . Obesity, unspecified   . Other abnormal glucose   . Ovarian mass   . Pure hypercholesterolemia   . Transfusion history    '15-"diverticulosis"  . Unspecified vitamin D deficiency    resolved   Past Surgical History:  Procedure Laterality Date  . CESAREAN SECTION     due to BP elevation AND BABY HEART RATE STOPPING  . COLONOSCOPY  2000   Diverticuli-Dr. Sharlett Iles  . COLONOSCOPY WITH PROPOFOL N/A 06/30/2015   diverticulosis, rpt 10 yrs Gatha Mayer, MD)  . DRAINAGE ABD/PERITON ABS PERC ([facility] [facility])  2015   liver abscess  . ESOPHAGOGASTRODUODENOSCOPY (EGD) WITH PROPOFOL N/A 12/30/2015   WNL Gatha Mayer, MD)  . IVC FILTER PLACED AND REMOVED   2015  . ROBOTIC ASSISTED BILATERAL SALPINGO OOPHERECTOMY Left 12/27/2016   XI ROBOTIC ASSISTED LYSIS OF ADHESIONS AND LEFT SALPINGECTOMY;  Everitt Amber, MD   Current Outpatient Prescriptions  Medication Sig Dispense Refill  . amLODipine (NORVASC) 5 MG tablet Take 1 tablet (5 mg total) by mouth daily. 90 tablet 3  . cetirizine (ZYRTEC) 10 MG tablet Take 10 mg by mouth at bedtime as needed for allergies.     . Cholecalciferol (VITAMIN D PO) Take 1 tablet by mouth daily.    Marland Kitchen dicyclomine (BENTYL) 10 MG capsule Take 1 capsule (10 mg total) by mouth 4 (four) times daily as needed for spasms. 30 capsule 3  . hydrochlorothiazide (HYDRODIURIL) 25 MG tablet Take 1 tablet (25 mg total) by mouth daily. 90 tablet 3  . ibuprofen (ADVIL,MOTRIN) 200 MG tablet Take 200 mg by mouth daily as needed for headache or moderate pain.    Marland Kitchen latanoprost (XALATAN) 0.005 % ophthalmic solution Place 1 drop into both eyes at bedtime.   0  . Multiple Vitamin (MULTIVITAMIN WITH MINERALS) TABS  tablet Take 1 tablet by mouth daily.    . ondansetron (ZOFRAN) 4 MG tablet Take 1 tablet (4 mg total) by mouth every 8 (eight) hours as needed for nausea or vomiting. 30 tablet 1  . oxyCODONE-acetaminophen (PERCOCET/ROXICET) 5-325 MG tablet Take 1-2 tablets by mouth every 4 (four) hours as needed for severe pain. 20 tablet 0  . trolamine salicylate (ASPERCREME) 10 % cream Apply 1 application topically as needed (knee pain).     No current facility-administered medications for this visit.    Allergies  Allergen Reactions  . Iodinated Diagnostic Agents Itching  . Lisinopril     REACTION: lethargy, cough.  . Penicillins Other (See Comments)    LYMPH NODES SWELL Has patient had a PCN reaction causing immediate rash, facial/tongue/throat swelling, SOB or lightheadedness with hypotension: No Has patient had a PCN reaction causing severe rash involving mucus membranes or skin necrosis: No Has patient had a PCN reaction that required hospitalization No Has patient had a PCN reaction occurring within the last 10 years: No If all of the above answers are "NO",  then may proceed with Cephalosporin use.    Social History   Social History  . Marital status: Divorced    Spouse name: N/A  . Number of children: 1  . Years of education: N/A   Occupational History  . Analyst; Dept of [employer]    Social History Main Topics  . Smoking status: Former Smoker    Years: 3.00    Quit date: 06/15/1977  . Smokeless tobacco: Never Used  . Alcohol use Yes     Comment: wine on occasion   . Drug use: No  . Sexual activity: No     Comment: By choice   Other Topics Concern  . Not on file   Social History Narrative   Caffeine: 2-3 cups coffee, 2 cups sweet tea   Lives with daughter and grand daughter.  No pets.   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   Activity: granddaughter.  No regular exercise.   Diet: fruits/vegetables daily, drinks a lot of  sweet tea   Family History  Problem Relation Age of Onset  . Hypertension Father   . Cancer Father        prostate  . Heart disease Father        CABG x 2  . Cancer Mother        Lung-quit smoking 20 years prior to Dx  . Hypertension Mother   . Drug abuse Brother   . Diabetes Unknown        + family hx  . Stroke Maternal Grandmother   . Sleep apnea Brother        with [employer] estranged  . Colon cancer Neg Hx       [name], MD 01/16/2017, 4:30 PM

## 2017-01-16 NOTE — Patient Instructions (Addendum)
You will be scheduled to see Dr Leighton Ruff at [facility] for discussion regarding possible sigmoid colectomy.   Please contact Dr Serita Grit office if you have any questions regarding your surgical recovery.  Plan to meet with Dr. Marcello Moores at [facility] on October 12th at 10 am.

## 2017-01-20 ENCOUNTER — Ambulatory Visit: Payer: BC Managed Care – PPO | Admitting: Gynecologic Oncology

## 2017-02-10 ENCOUNTER — Ambulatory Visit: Payer: Self-pay | Admitting: General Surgery

## 2017-02-10 NOTE — H&P (Signed)
History of Present Illness Leah Ruff MD; 00/93/8182 11:26 AM) The patient is a 61 year old female who presents with diverticulitis. 61 year old female who presents to the office for evaluation of left lower quadrant pain. This has been occurring intermittently for approximately 18 months. She underwent a CT scan over the summer which showed a septated left ovary. She was seen by gynecology and a nephrectomy was recommended. She underwent a robotic lysis of adhesion on December 22, 2016 by Dr. Denman George. This showed mostly inclusion cyst and it appeared that the sigmoid colon was fibrotic and most likely the source of her left lower quadrant pain. The procedure was aborted and she was sent to me for further evaluation and possible sigmoidectomy. Since that time she has had one severe episode of pain causing nausea and vomiting. Otherwise she has been tolerating a diet and has gained some weight. She had previously lost approximately 60 pounds due to inability to tolerate a diet. She has a history of an ICU admission and IV antibiotics for approximately one month due to perforated diverticulitis in 2013. It appears she had IR drainage of an interloop abscess at this time. She did have a negative colonoscopy with Dr. Carlean Purl in February 2017.   Past Surgical History Leah Lorenzo, LPN; 99/37/1696 78:93 AM) Cesarean Section - 1  Diagnostic Studies History Leah Lorenzo, LPN; 81/05/7508 25:85 AM) Colonoscopy 1-5 years ago Mammogram within last year Pap Smear 1-5 years ago  Allergies [name], RMA; 02/10/2017 10:44 AM) Penicillin G Potassium *PENICILLINS* Lisinopril *ANTIHYPERTENSIVES* Dyes IV CONTRAST Allergies Reconciled  Medication History [name], RMA; 02/10/2017 10:45 AM) Oxycodone-Acetaminophen (5-325MG  Tablet, Oral) Active. AmLODIPine Besylate (5MG  Tablet, Oral) Active. Dicyclomine HCl (10MG  Capsule, Oral) Active. HydroCHLOROthiazide (25MG  Tablet, Oral)  Active. Latanoprost (0.005% Solution, Ophthalmic) Active. Ondansetron HCl (4MG  Tablet, Oral) Active. PredniSONE (50MG  Tablet, Oral) Active. Medications Reconciled  Social History Leah Lorenzo, LPN; 27/78/2423 53:61 AM) Alcohol use Moderate alcohol use. Caffeine use Coffee. Illicit drug use Prefer to discuss with provider. Tobacco use Former smoker.  Family History Leah Lorenzo, LPN; 44/31/5400 86:76 AM) Diabetes Mellitus Father, Mother. Hypertension Father, Mother.  Pregnancy / Birth History Leah Lorenzo, LPN; 19/50/9326 71:24 AM) Age at menarche 48 years. Gravida 3 Irregular periods Length (months) of breastfeeding 7-12 Maternal age 29-25 Para 2  Other Problems Leah Lorenzo, LPN; 58/01/9832 82:50 AM) Breast Cancer High blood pressure     Review of Systems Leah Billings Dockery LPN; 53/97/6734 19:37 AM) General Not Present- Appetite Loss, Chills, Fatigue, Fever, Night Sweats, Weight Gain and Weight Loss. Skin Not Present- Change in Wart/Mole, Dryness, Hives, Jaundice, New Lesions, Non-Healing Wounds, Rash and Ulcer. Respiratory Not Present- Bloody sputum, Chronic Cough, Difficulty Breathing, Snoring and Wheezing. Breast Not Present- Breast Mass, Breast Pain, Nipple Discharge and Skin Changes. Cardiovascular Not Present- Chest Pain, Difficulty Breathing Lying Down, Leg Cramps, Palpitations, Rapid Heart Rate, Shortness of Breath and Swelling of Extremities. Gastrointestinal Not Present- Abdominal Pain, Bloating, Bloody Stool, Change in Bowel Habits, Chronic diarrhea, Constipation, Difficulty Swallowing, Excessive gas, Gets full quickly at meals, Hemorrhoids, Indigestion, Nausea, Rectal Pain and Vomiting. Female Genitourinary Not Present- Frequency, Nocturia, Painful Urination, Pelvic Pain and Urgency. Musculoskeletal Not Present- Back Pain, Joint Pain, Joint Stiffness, Muscle Pain, Muscle Weakness and Swelling of Extremities. Neurological Not Present-  Decreased Memory, Fainting, Headaches, Numbness, Seizures, Tingling, Tremor, Trouble walking and Weakness. Psychiatric Not Present- Anxiety, Bipolar, Change in Sleep Pattern, Depression, Fearful and Frequent crying. Endocrine Not Present- Cold Intolerance, Excessive Hunger, Hair Changes, Heat Intolerance, Hot flashes  and New Diabetes.  Vitals Leah Peterson RMA; 02/10/2017 10:42 AM) 02/10/2017 10:42 AM Weight: 255.4 lb Height: 61in Body Surface Area: 2.1 m Body Mass Index: 48.26 kg/m  Temp.: 97.65F  Pulse: 74 (Regular)  BP: 145/72 (Sitting, Left Arm, Standard)      Physical Exam Leah Ruff MD; 02/58/5277 11:26 AM)  General Mental Status-Alert. General Appearance-Not in acute distress. Build & Nutrition-Well nourished. Posture-Normal posture. Gait-Normal.  Head and Neck Head-normocephalic, atraumatic with no lesions or palpable masses. Trachea-midline.  Chest and Lung Exam Chest and lung exam reveals -on auscultation, normal breath sounds, no adventitious sounds and normal vocal resonance.  Cardiovascular Cardiovascular examination reveals -normal heart sounds, regular rate and rhythm with no murmurs and no digital clubbing, cyanosis, edema, increased warmth or tenderness.  Abdomen Inspection Inspection of the abdomen reveals - No Hernias. Palpation/Percussion Palpation and Percussion of the abdomen reveal - Soft, Non Tender, No Rigidity (guarding), No hepatosplenomegaly and No Palpable abdominal masses.  Neurologic Neurologic evaluation reveals -alert and oriented x 3 with no impairment of recent or remote memory, normal attention span and ability to concentrate, normal sensation and normal coordination.  Musculoskeletal Normal Exam - Bilateral-Upper Extremity Strength Normal and Lower Extremity Strength Normal.    Assessment & Plan Leah Ruff MD; 82/42/3536 11:36 AM)  Leah Peterson STRICTURE [phone]) Impression:  61 year old female who presents to the office for evaluation of a clinically diagnosed diverticular stricture. She has pain in the left lower quadrant. She has a CT scan that shows diverticulitis and a history of perforated diverticulitis requiring IR drainage and IV antibiotics in 2013. She underwent a diagnostic laparoscopy on August 23 with Dr. Denman George which showed a fibrotic sigmoid colon. I have recommended that she undergo an elective sigmoidectomy with robotic assistance to help alleviate the chances of any further problems in this area. She has had a recent colonoscopy las year, which was normal. The surgery and anatomy were described to the patient as well as the risks of surgery and the possible complications. These include: Bleeding, deep abdominal infections and possible wound complications such as hernia and infection, damage to adjacent structures, leak of surgical connections, which can lead to other surgeries and possibly an ostomy, possible need for other procedures, such as abscess drains in radiology, possible prolonged hospital stay, possible diarrhea from removal of part of the colon, possible constipation from narcotics, possible bowel, bladder or sexual dysfunction if having rectal surgery, prolonged fatigue/weakness or appetite loss, possible early recurrence of of disease, possible complications of their medical problems such as heart disease or arrhythmias or lung problems, death (less than 1%). I believe the patient understands and wishes to proceed with the surgery.

## 2017-03-06 ENCOUNTER — Other Ambulatory Visit: Payer: Self-pay | Admitting: Urology

## 2017-03-27 ENCOUNTER — Encounter (HOSPITAL_COMMUNITY): Payer: Self-pay

## 2017-03-27 NOTE — Pre-Procedure Instructions (Signed)
The following are in epic: EKG 12/21/16 Last office note Dr. Denman George 01/16/17 US pelvis 11/23/16 CT abd 11/15/16

## 2017-03-27 NOTE — Patient Instructions (Addendum)
Leah Peterson [facility]  03/27/2017   Your procedure is scheduled on: Thursday, Nov. 29, 2018   Report to [facility] Main  Entrance   Take [place]  elevators to 3rd floor to  [place] at 5:45 AM.   Call this number if you have problems the morning of surgery [phone]    Remember: ONLY 1 PERSON MAY GO WITH YOU TO SHORT STAY TO GET  READY MORNING OF [place].   Do not eat food or drink liquids :After Midnight.   Take these medicines the morning of surgery with A SIP OF WATER: Amlodipine                               You may not have any metal on your body including hair pins, jewelry,  and body  piercings             Do not wear  make-up, lotions, powders, perfumes, or deodorant             Do not wear nail polish.  Do not shave  48 hours prior to surgery.               Do not bring valuables to the hospital. [place].   Contacts, dentures or bridgework may not be worn into surgery.   Leave suitcase in the car. After surgery it may be brought to your room.              Please read over the following fact sheets you were given: _____________________________________________________________________             [facility] - Preparing for Surgery Before surgery, you can play an important role.  Because skin is not sterile, your skin needs to be as free of germs as possible.  You can reduce the number of germs on your skin by washing with CHG (chlorahexidine gluconate) soap before surgery.  CHG is an antiseptic cleaner which kills germs and bonds with the skin to continue killing germs even after washing. Please DO NOT use if you have an allergy to CHG or antibacterial soaps.  If your skin becomes reddened/irritated stop using the CHG and inform your nurse when you arrive at Short Stay. Do not shave (including legs and underarms) for at least 48 hours prior to the first CHG shower.  You may shave your  face/neck.  Please follow these instructions carefully:  1.  Shower with CHG Soap the night before surgery and the  morning of surgery.  2.  If you choose to wash your hair, wash your hair first as usual with your normal  shampoo.  3.  After you shampoo, rinse your hair and body thoroughly to remove the shampoo.                             4.  Use CHG as you would any other liquid soap.  You can apply chg directly to the skin and wash.  Gently with a scrungie or clean washcloth.  5.  Apply the CHG Soap to your body ONLY FROM THE NECK DOWN.   Do   not use on face/ open  Wound or open sores. Avoid contact with eyes, ears mouth and   genitals (private parts).                       Wash face,  Genitals (private parts) with your normal soap.             6.  Wash thoroughly, paying special attention to the area where your    surgery  will be performed.  7.  Thoroughly rinse your body with warm water from the neck down.  8.  DO NOT shower/wash with your normal soap after using and rinsing off the CHG Soap.                9.  Pat yourself dry with a clean towel.            10.  Wear clean pajamas.            11.  Place clean sheets on your bed the night of your first shower and do not  sleep with pets. Day of Surgery : Do not apply any lotions/deodorants the morning of surgery.  Please wear clean clothes to the hospital/surgery center.  FAILURE TO FOLLOW THESE INSTRUCTIONS MAY RESULT IN THE CANCELLATION OF YOUR SURGERY  PATIENT SIGNATURE_________________________________  NURSE SIGNATURE__________________________________  ________________________________________________________________________   Leah Peterson  An incentive spirometer is a tool that can help keep your lungs clear and active. This tool measures how well you are filling your lungs with each breath. Taking long deep breaths may help reverse or decrease the chance of developing breathing (pulmonary)  problems (especially infection) following:  A long period of time when you are unable to move or be active. BEFORE THE PROCEDURE   If the spirometer includes an indicator to show your best effort, your nurse or respiratory therapist will set it to a desired goal.  If possible, sit up straight or lean slightly forward. Try not to slouch.  Hold the incentive spirometer in an upright position. INSTRUCTIONS FOR USE  1. Sit on the edge of your bed if possible, or sit up as far as you can in bed or on a chair. 2. Hold the incentive spirometer in an upright position. 3. Breathe out normally. 4. Place the mouthpiece in your mouth and seal your lips tightly around it. 5. Breathe in slowly and as deeply as possible, raising the piston or the ball toward the top of the column. 6. Hold your breath for 3-5 seconds or for as long as possible. Allow the piston or ball to fall to the bottom of the column. 7. Remove the mouthpiece from your mouth and breathe out normally. 8. Rest for a few seconds and repeat Steps 1 through 7 at least 10 times every 1-2 hours when you are awake. Take your time and take a few normal breaths between deep breaths. 9. The spirometer may include an indicator to show your best effort. Use the indicator as a goal to work toward during each repetition. 10. After each set of 10 deep breaths, practice coughing to be sure your lungs are clear. If you have an incision (the cut made at the time of surgery), support your incision when coughing by placing a pillow or rolled up towels firmly against it. Once you are able to get out of bed, walk around indoors and cough well. You may stop using the incentive spirometer when instructed by your caregiver.  RISKS AND COMPLICATIONS  Take your time  so you do not get dizzy or light-headed.  If you are in pain, you may need to take or ask for pain medication before doing incentive spirometry. It is harder to take a deep breath if you are having  pain. AFTER USE  Rest and breathe slowly and easily.  It can be helpful to keep track of a log of your progress. Your caregiver can provide you with a simple table to help with this. If you are using the spirometer at home, follow these instructions: [place] IF:   You are having difficultly using the spirometer.  You have trouble using the spirometer as often as instructed.  Your pain medication is not giving enough relief while using the spirometer.  You develop fever of 100.5 F (38.1 C) or higher. SEEK IMMEDIATE MEDICAL CARE IF:   You cough up bloody sputum that had not been present before.  You develop fever of 102 F (38.9 C) or greater.  You develop worsening pain at or near the incision site. MAKE SURE YOU:   Understand these instructions.  Will watch your condition.  Will get help right away if you are not doing well or get worse. Document Released: 08/29/2006 Document Revised: 07/11/2011 Document Reviewed: 10/30/2006 [facility] Patient Information 2014 [place], [place].   ________________________________________________________________________

## 2017-03-29 ENCOUNTER — Other Ambulatory Visit: Payer: Self-pay

## 2017-03-29 ENCOUNTER — Encounter (HOSPITAL_COMMUNITY): Payer: Self-pay

## 2017-03-29 ENCOUNTER — Encounter (HOSPITAL_COMMUNITY)
Admission: RE | Admit: 2017-03-29 | Discharge: 2017-03-29 | Disposition: A | Discharge status: Home / Self Care | Payer: BC Managed Care – PPO | Source: Ambulatory Visit | Attending: General Surgery | Admitting: General Surgery

## 2017-03-29 LAB — BASIC METABOLIC PANEL
ANION GAP: 7 (ref 5–15)
BUN: 21 mg/dL — AB (ref 6–20)
CALCIUM: 9.1 mg/dL (ref 8.9–10.3)
CO2: 28 mmol/L (ref 22–32)
Chloride: 104 mmol/L (ref 101–111)
Creatinine, Ser: 1.09 mg/dL — ABNORMAL HIGH (ref 0.44–1.00)
GFR calc Af Amer: >60 mL/min (ref >60)
GFR, EST NON AFRICAN AMERICAN: 54 mL/min — AB (ref >60)
GLUCOSE: 98 mg/dL (ref 65–99)
POTASSIUM: 3.7 mmol/L (ref 3.5–5.1)
SODIUM: 139 mmol/L (ref 135–145)

## 2017-03-29 LAB — CBC
HCT: 37.5 % (ref 36.0–46.0)
HEMOGLOBIN: 12.3 g/dL (ref 12.0–15.0)
MCH: 28.1 pg (ref 26.0–34.0)
MCHC: 32.8 g/dL (ref 30.0–36.0)
MCV: 85.8 fL (ref 78.0–100.0)
PLATELETS: 201 10*3/uL (ref 150–400)
RBC: 4.37 MIL/uL (ref 3.87–5.11)
RDW: 13.1 % (ref 11.5–15.5)
WBC: 3.8 10*3/uL — AB (ref 4.0–10.5)

## 2017-03-29 NOTE — Anesthesia Preprocedure Evaluation (Deleted)
Anesthesia Evaluation    Airway Mallampati: II  TM Distance: >3 FB Neck ROM: Full    Dental no notable dental hx.    Pulmonary former smoker,    Pulmonary exam normal breath sounds clear to auscultation       Cardiovascular hypertension, Normal cardiovascular exam Rhythm:Regular Rate:Normal     Neuro/Psych    GI/Hepatic   Endo/Other    Renal/GU      Musculoskeletal   Abdominal   Peds  Hematology   Anesthesia Other Findings   Reproductive/Obstetrics                                                              Anesthesia Evaluation  Patient identified by MRN, date of birth, ID band Patient awake    Reviewed: Allergy & Precautions, NPO status , Patient's Chart, lab work & pertinent test results  History of Anesthesia Complications Negative for: history of anesthetic complications  Airway Mallampati: II  TM Distance: >3 FB Neck ROM: Full    Dental  (+) Dental Advisory Given   Pulmonary former smoker,    breath sounds clear to auscultation       Cardiovascular hypertension, Pt. on medications (-) angina Rhythm:Regular Rate:Normal  '15 ECHO: EF 55-60%, valves OK   Neuro/Psych negative neurological ROS     GI/Hepatic Neg liver ROS, N/v, but none presently   Endo/Other  Morbid obesity  Renal/GU negative Renal ROS     Musculoskeletal   Abdominal (+) + obese,   Peds  Hematology   Anesthesia Other Findings   Reproductive/Obstetrics                             Anesthesia Physical  Anesthesia Plan  ASA: III  Anesthesia Plan: General   Post-op Pain Management:    Induction: Intravenous  PONV Risk Score and Plan: 3 and Ondansetron, Dexamethasone, Midazolam and Treatment may vary due to age or medical condition  Airway Management Planned: Oral ETT  Additional Equipment:   Intra-op Plan:   Post-operative Plan: Extubation  in OR  Informed Consent: I have reviewed the patients History and Physical, chart, labs and discussed the procedure including the risks, benefits and alternatives for the proposed anesthesia with the patient or authorized representative who has indicated his/her understanding and acceptance.   Dental advisory given  Plan Discussed with: CRNA and Surgeon  Anesthesia Plan Comments: (Plan routine monitors)        Anesthesia Quick Evaluation  Anesthesia Physical  Anesthesia Plan  ASA: III  Anesthesia Plan: General   Post-op Pain Management:    Induction: Intravenous  PONV Risk Score and Plan: 2 and Dexamethasone, Ondansetron and Treatment may vary due to age or medical condition  Airway Management Planned:   Additional Equipment:   Intra-op Plan: Utilization of Controlled Hypotension per surrgeon request  Post-operative Plan: Extubation in OR  Informed Consent: I have reviewed the patients History and Physical, chart, labs and discussed the procedure including the risks, benefits and alternatives for the proposed anesthesia with the patient or authorized representative who has indicated his/her understanding and acceptance.   Dental advisory given  Plan Discussed with: CRNA  Anesthesia Plan Comments:         Anesthesia Quick Evaluation

## 2017-03-29 NOTE — Pre-Procedure Instructions (Signed)
CBC and BMP results faxed to Dr. Marcello Moores and Dr. Jeffie Pollock via epic.

## 2017-03-30 ENCOUNTER — Inpatient Hospital Stay (HOSPITAL_COMMUNITY)
Admission: RE | Admit: 2017-03-30 | Discharge: 2017-04-03 | DRG: 330 | Disposition: A | Discharge status: Home / Self Care | Payer: BC Managed Care – PPO | Source: Ambulatory Visit | Attending: General Surgery | Admitting: General Surgery

## 2017-03-30 ENCOUNTER — Inpatient Hospital Stay (HOSPITAL_COMMUNITY): Payer: BC Managed Care – PPO | Admitting: Anesthesiology

## 2017-03-30 ENCOUNTER — Encounter (HOSPITAL_COMMUNITY)
Admission: RE | Disposition: A | Discharge status: Home / Self Care | Payer: Self-pay | Source: Ambulatory Visit | Attending: General Surgery

## 2017-03-30 ENCOUNTER — Inpatient Hospital Stay (HOSPITAL_COMMUNITY): Payer: BC Managed Care – PPO

## 2017-03-30 ENCOUNTER — Encounter (HOSPITAL_COMMUNITY): Payer: Self-pay | Admitting: Anesthesiology

## 2017-03-30 DIAGNOSIS — N83202 Unspecified ovarian cyst, left side: Secondary | ICD-10-CM

## 2017-03-30 DIAGNOSIS — I10 Essential (primary) hypertension: Secondary | ICD-10-CM | POA: Diagnosis present

## 2017-03-30 DIAGNOSIS — K5730 Diverticulosis of large intestine without perforation or abscess without bleeding: Principal | ICD-10-CM | POA: Diagnosis present

## 2017-03-30 DIAGNOSIS — E669 Obesity, unspecified: Secondary | ICD-10-CM | POA: Diagnosis present

## 2017-03-30 DIAGNOSIS — R1032 Left lower quadrant pain: Secondary | ICD-10-CM | POA: Diagnosis present

## 2017-03-30 DIAGNOSIS — E785 Hyperlipidemia, unspecified: Secondary | ICD-10-CM | POA: Diagnosis present

## 2017-03-30 DIAGNOSIS — K5790 Diverticulosis of intestine, part unspecified, without perforation or abscess without bleeding: Secondary | ICD-10-CM | POA: Diagnosis present

## 2017-03-30 DIAGNOSIS — Z6841 Body Mass Index (BMI) 40.0 and over, adult: Secondary | ICD-10-CM

## 2017-03-30 DIAGNOSIS — Z87891 Personal history of nicotine dependence: Secondary | ICD-10-CM

## 2017-03-30 HISTORY — PX: ROBOTIC ASSISTED BILATERAL SALPINGO OOPHERECTOMY: SHX6078

## 2017-03-30 HISTORY — PX: CYSTOSCOPY WITH STENT PLACEMENT: SHX5790

## 2017-03-30 SURGERY — COLECTOMY, PARTIAL, ROBOT-ASSISTED, LAPAROSCOPIC
Anesthesia: General | Site: Ureter

## 2017-03-30 MED ORDER — DIPHENHYDRAMINE HCL 50 MG/ML IJ SOLN: 25.0000 mg | Freq: Four times a day (QID) | INTRAMUSCULAR | Status: DC | PRN

## 2017-03-30 MED ORDER — LABETALOL HCL 5 MG/ML IV SOLN
INTRAVENOUS | Status: AC
  Filled 2017-03-30: qty 4

## 2017-03-30 MED ORDER — MIDAZOLAM HCL 2 MG/2ML IJ SOLN
INTRAMUSCULAR | Status: AC
  Filled 2017-03-30: qty 2

## 2017-03-30 MED ORDER — BUPIVACAINE-EPINEPHRINE 0.25% -1:200000 IJ SOLN
INTRAMUSCULAR | Status: DC | PRN
  Administered 2017-03-30: 30 mL

## 2017-03-30 MED ORDER — ONDANSETRON HCL 4 MG/2ML IJ SOLN
INTRAMUSCULAR | Status: DC | PRN
  Administered 2017-03-30: 4 mg via INTRAVENOUS

## 2017-03-30 MED ORDER — 0.9 % SODIUM CHLORIDE (POUR BTL) OPTIME
TOPICAL | Status: DC | PRN
  Administered 2017-03-30: 2000 mL

## 2017-03-30 MED ORDER — SUCCINYLCHOLINE CHLORIDE 200 MG/10ML IV SOSY
PREFILLED_SYRINGE | INTRAVENOUS | Status: AC
  Filled 2017-03-30: qty 10

## 2017-03-30 MED ORDER — LABETALOL HCL 5 MG/ML IV SOLN
INTRAVENOUS | Status: DC | PRN
  Administered 2017-03-30: 5 mg via INTRAVENOUS

## 2017-03-30 MED ORDER — GABAPENTIN 300 MG PO CAPS
300.0000 mg | ORAL_CAPSULE | ORAL | Status: AC
  Administered 2017-03-30: 300 mg via ORAL
  Filled 2017-03-30: qty 1

## 2017-03-30 MED ORDER — LIDOCAINE 2% (20 MG/ML) 5 ML SYRINGE
INTRAMUSCULAR | Status: AC
  Filled 2017-03-30: qty 5

## 2017-03-30 MED ORDER — SODIUM CHLORIDE 0.9 % IR SOLN
Status: DC | PRN
  Administered 2017-03-30: 3000 mL

## 2017-03-30 MED ORDER — SUFENTANIL CITRATE 50 MCG/ML IV SOLN
INTRAVENOUS | Status: DC | PRN
  Administered 2017-03-30 (×4): 10 ug via INTRAVENOUS
  Administered 2017-03-30: 20 ug via INTRAVENOUS
  Administered 2017-03-30 (×2): 10 ug via INTRAVENOUS
  Administered 2017-03-30: 20 ug via INTRAVENOUS

## 2017-03-30 MED ORDER — CHLORHEXIDINE GLUCONATE CLOTH 2 % EX PADS: 6.0000 | MEDICATED_PAD | Freq: Once | CUTANEOUS | Status: DC

## 2017-03-30 MED ORDER — ONDANSETRON HCL 4 MG/2ML IJ SOLN: 4.0000 mg | Freq: Four times a day (QID) | INTRAMUSCULAR | Status: DC | PRN

## 2017-03-30 MED ORDER — BUPIVACAINE-EPINEPHRINE (PF) 0.25% -1:200000 IJ SOLN
INTRAMUSCULAR | Status: AC
  Filled 2017-03-30: qty 30

## 2017-03-30 MED ORDER — SUFENTANIL CITRATE 50 MCG/ML IV SOLN
INTRAVENOUS | Status: AC
  Filled 2017-03-30: qty 1

## 2017-03-30 MED ORDER — HYDROCHLOROTHIAZIDE 25 MG PO TABS
25.0000 mg | ORAL_TABLET | Freq: Every day | ORAL | Status: DC
  Administered 2017-03-30 – 2017-04-02 (×4): 25 mg via ORAL
  Filled 2017-03-30 (×4): qty 1

## 2017-03-30 MED ORDER — LIDOCAINE 2% (20 MG/ML) 5 ML SYRINGE
INTRAMUSCULAR | Status: AC
  Filled 2017-03-30: qty 15

## 2017-03-30 MED ORDER — PHENYLEPHRINE 40 MCG/ML (10ML) SYRINGE FOR IV PUSH (FOR BLOOD PRESSURE SUPPORT)
PREFILLED_SYRINGE | INTRAVENOUS | Status: DC | PRN
  Administered 2017-03-30 (×2): 40 ug via INTRAVENOUS

## 2017-03-30 MED ORDER — BUPIVACAINE LIPOSOME 1.3 % IJ SUSP
INTRAMUSCULAR | Status: DC | PRN
  Administered 2017-03-30: 20 mL

## 2017-03-30 MED ORDER — PROPOFOL 10 MG/ML IV BOLUS
INTRAVENOUS | Status: DC | PRN
  Administered 2017-03-30: 130 mg via INTRAVENOUS

## 2017-03-30 MED ORDER — SUGAMMADEX SODIUM 500 MG/5ML IV SOLN
INTRAVENOUS | Status: AC
  Filled 2017-03-30: qty 5

## 2017-03-30 MED ORDER — STERILE WATER FOR IRRIGATION IR SOLN
Status: DC | PRN
  Administered 2017-03-30: 1000 mL

## 2017-03-30 MED ORDER — CEFOTETAN DISODIUM-DEXTROSE 2-2.08 GM-%(50ML) IV SOLR
2.0000 g | INTRAVENOUS | Status: AC
  Administered 2017-03-30: 2 g via INTRAVENOUS

## 2017-03-30 MED ORDER — SODIUM CHLORIDE 0.9 % IJ SOLN
INTRAMUSCULAR | Status: AC
  Filled 2017-03-30: qty 10

## 2017-03-30 MED ORDER — MIDAZOLAM HCL 2 MG/2ML IJ SOLN
INTRAMUSCULAR | Status: DC | PRN
  Administered 2017-03-30: 2 mg via INTRAVENOUS

## 2017-03-30 MED ORDER — HYDROMORPHONE HCL 1 MG/ML IJ SOLN
0.5000 mg | INTRAMUSCULAR | Status: DC | PRN
  Filled 2017-03-30: qty 1

## 2017-03-30 MED ORDER — DEXAMETHASONE SODIUM PHOSPHATE 10 MG/ML IJ SOLN
INTRAMUSCULAR | Status: DC | PRN
  Administered 2017-03-30: 10 mg via INTRAVENOUS

## 2017-03-30 MED ORDER — AMLODIPINE BESYLATE 5 MG PO TABS
5.0000 mg | ORAL_TABLET | Freq: Every day | ORAL | Status: DC
  Administered 2017-03-31 – 2017-04-02 (×3): 5 mg via ORAL
  Filled 2017-03-30 (×3): qty 1

## 2017-03-30 MED ORDER — ACETAMINOPHEN 500 MG PO TABS
1000.0000 mg | ORAL_TABLET | ORAL | Status: AC
  Administered 2017-03-30: 1000 mg via ORAL
  Filled 2017-03-30: qty 2

## 2017-03-30 MED ORDER — ROCURONIUM BROMIDE 50 MG/5ML IV SOSY
PREFILLED_SYRINGE | INTRAVENOUS | Status: AC
  Filled 2017-03-30: qty 5

## 2017-03-30 MED ORDER — ALVIMOPAN 12 MG PO CAPS
12.0000 mg | ORAL_CAPSULE | Freq: Two times a day (BID) | ORAL | Status: DC
  Administered 2017-03-31 (×2): 12 mg via ORAL
  Filled 2017-03-30 (×3): qty 1

## 2017-03-30 MED ORDER — DIPHENHYDRAMINE HCL 25 MG PO CAPS
25.0000 mg | ORAL_CAPSULE | Freq: Four times a day (QID) | ORAL | Status: DC | PRN
Start: 2017-03-30 — End: 2017-04-03

## 2017-03-30 MED ORDER — ROCURONIUM BROMIDE 10 MG/ML (PF) SYRINGE
PREFILLED_SYRINGE | INTRAVENOUS | Status: DC | PRN
  Administered 2017-03-30: 10 mg via INTRAVENOUS
  Administered 2017-03-30: 20 mg via INTRAVENOUS
  Administered 2017-03-30: 50 mg via INTRAVENOUS
  Administered 2017-03-30 (×2): 20 mg via INTRAVENOUS
  Administered 2017-03-30: 10 mg via INTRAVENOUS

## 2017-03-30 MED ORDER — FLUORESCEIN SODIUM 10 % IV SOLN
INTRAVENOUS | Status: DC | PRN
  Administered 2017-03-30: 10 mL via INTRAVENOUS

## 2017-03-30 MED ORDER — LACTATED RINGERS IV SOLN
INTRAVENOUS | Status: DC | PRN
  Administered 2017-03-30 (×3): via INTRAVENOUS

## 2017-03-30 MED ORDER — ONDANSETRON HCL 4 MG/2ML IJ SOLN
INTRAMUSCULAR | Status: AC
  Filled 2017-03-30: qty 2

## 2017-03-30 MED ORDER — DEXTROSE 5 % IV SOLN
2.0000 g | Freq: Two times a day (BID) | INTRAVENOUS | Status: AC
  Administered 2017-03-30: 2 g via INTRAVENOUS
  Filled 2017-03-30: qty 2

## 2017-03-30 MED ORDER — LIDOCAINE 2% (20 MG/ML) 5 ML SYRINGE
INTRAMUSCULAR | Status: DC | PRN
  Administered 2017-03-30: 100 mg via INTRAVENOUS

## 2017-03-30 MED ORDER — PHENYLEPHRINE 40 MCG/ML (10ML) SYRINGE FOR IV PUSH (FOR BLOOD PRESSURE SUPPORT)
PREFILLED_SYRINGE | INTRAVENOUS | Status: AC
  Filled 2017-03-30: qty 10

## 2017-03-30 MED ORDER — ENOXAPARIN SODIUM 40 MG/0.4ML ~~LOC~~ SOLN
40.0000 mg | SUBCUTANEOUS | Status: DC
  Administered 2017-03-31 – 2017-04-02 (×3): 40 mg via SUBCUTANEOUS
  Filled 2017-03-30 (×4): qty 0.4

## 2017-03-30 MED ORDER — ALVIMOPAN 12 MG PO CAPS
12.0000 mg | ORAL_CAPSULE | ORAL | Status: AC
  Administered 2017-03-30: 12 mg via ORAL
  Filled 2017-03-30: qty 1

## 2017-03-30 MED ORDER — CEFOTETAN DISODIUM-DEXTROSE 2-2.08 GM-%(50ML) IV SOLR
INTRAVENOUS | Status: AC
  Filled 2017-03-30: qty 50

## 2017-03-30 MED ORDER — DEXAMETHASONE SODIUM PHOSPHATE 10 MG/ML IJ SOLN
INTRAMUSCULAR | Status: AC
  Filled 2017-03-30: qty 1

## 2017-03-30 MED ORDER — ALUM & MAG HYDROXIDE-SIMETH 200-200-20 MG/5ML PO SUSP: 30.0000 mL | Freq: Four times a day (QID) | ORAL | Status: DC | PRN

## 2017-03-30 MED ORDER — KCL IN DEXTROSE-NACL 20-5-0.45 MEQ/L-%-% IV SOLN
INTRAVENOUS | Status: DC
  Administered 2017-03-30 – 2017-04-02 (×6): via INTRAVENOUS
  Filled 2017-03-30 (×5): qty 1000

## 2017-03-30 MED ORDER — BUPIVACAINE LIPOSOME 1.3 % IJ SUSP
20.0000 mL | INTRAMUSCULAR | Status: DC
  Filled 2017-03-30: qty 20

## 2017-03-30 MED ORDER — ACETAMINOPHEN 500 MG PO TABS
1000.0000 mg | ORAL_TABLET | Freq: Four times a day (QID) | ORAL | Status: DC
  Administered 2017-03-30 – 2017-04-03 (×15): 1000 mg via ORAL
  Filled 2017-03-30 (×15): qty 2

## 2017-03-30 MED ORDER — LIDOCAINE 2% (20 MG/ML) 5 ML SYRINGE
INTRAMUSCULAR | Status: DC | PRN
  Administered 2017-03-30: 1.5 mg/kg/h via INTRAVENOUS

## 2017-03-30 MED ORDER — FENTANYL CITRATE (PF) 100 MCG/2ML IJ SOLN: 25.0000 ug | INTRAMUSCULAR | Status: DC | PRN

## 2017-03-30 MED ORDER — LIDOCAINE 2% (20 MG/ML) 5 ML SYRINGE: INTRAMUSCULAR | Status: DC | PRN

## 2017-03-30 MED ORDER — SACCHAROMYCES BOULARDII 250 MG PO CAPS
250.0000 mg | ORAL_CAPSULE | Freq: Two times a day (BID) | ORAL | Status: DC
  Administered 2017-03-30 – 2017-04-02 (×7): 250 mg via ORAL
  Filled 2017-03-30 (×7): qty 1

## 2017-03-30 MED ORDER — ONDANSETRON HCL 4 MG PO TABS: 4.0000 mg | ORAL_TABLET | Freq: Four times a day (QID) | ORAL | Status: DC | PRN

## 2017-03-30 MED ORDER — SUGAMMADEX SODIUM 200 MG/2ML IV SOLN
INTRAVENOUS | Status: DC | PRN
  Administered 2017-03-30: 250 mg via INTRAVENOUS

## 2017-03-30 SURGICAL SUPPLY — 114 items
ADAPTER GOLDBERG URETERAL (ADAPTER) ×2 IMPLANT
ADH SKN CLS APL DERMABOND .7 (GAUZE/BANDAGES/DRESSINGS) ×3
ADPR CATH 15X14FR FL DRN BG (ADAPTER) ×3
BAG URO CATCHER STRL LF (MISCELLANEOUS) ×5 IMPLANT
BLADE EXTENDED COATED 6.5IN (ELECTRODE) IMPLANT
CANNULA REDUC XI 12-8 STAPL (CANNULA) ×1
CANNULA REDUC XI 12-8MM STAPL (CANNULA) ×1
CANNULA REDUCER 12-8 DVNC XI (CANNULA) ×3 IMPLANT
CATH URET 5FR 28IN OPEN ENDED (CATHETERS) ×4 IMPLANT
CELLS DAT CNTRL 66122 CELL SVR (MISCELLANEOUS) IMPLANT
CLIP VESOLOCK LG 6/CT PURPLE (CLIP) IMPLANT
CLIP VESOLOCK MED 6/CT (CLIP) IMPLANT
CLOTH BEACON ORANGE TIMEOUT ST (SAFETY) ×5 IMPLANT
COVER FOOTSWITCH UNIV (MISCELLANEOUS) IMPLANT
COVER MAYO STAND STRL (DRAPES) ×6 IMPLANT
COVER SURGICAL LIGHT HANDLE (MISCELLANEOUS) ×10 IMPLANT
COVER TIP SHEARS 8 DVNC (MISCELLANEOUS) ×3 IMPLANT
COVER TIP SHEARS 8MM DA VINCI (MISCELLANEOUS) ×2
DECANTER SPIKE VIAL GLASS SM (MISCELLANEOUS) IMPLANT
DERMABOND ADVANCED (GAUZE/BANDAGES/DRESSINGS) ×2
DERMABOND ADVANCED .7 DNX12 (GAUZE/BANDAGES/DRESSINGS) IMPLANT
DRAIN CHANNEL 19F RND (DRAIN) IMPLANT
DRAPE ARM DVNC X/XI (DISPOSABLE) ×12 IMPLANT
DRAPE COLUMN DVNC XI (DISPOSABLE) ×3 IMPLANT
DRAPE DA VINCI XI ARM (DISPOSABLE) ×8
DRAPE DA VINCI XI COLUMN (DISPOSABLE) ×2
DRAPE SURG IRRIG POUCH 19X23 (DRAPES) ×5 IMPLANT
DRSG OPSITE POSTOP 4X10 (GAUZE/BANDAGES/DRESSINGS) IMPLANT
DRSG OPSITE POSTOP 4X6 (GAUZE/BANDAGES/DRESSINGS) ×2 IMPLANT
DRSG OPSITE POSTOP 4X8 (GAUZE/BANDAGES/DRESSINGS) IMPLANT
ELECT PENCIL ROCKER SW 15FT (MISCELLANEOUS) ×8 IMPLANT
ELECT REM PT RETURN 15FT ADLT (MISCELLANEOUS) ×5 IMPLANT
ENDOLOOP SUT PDS II  0 18 (SUTURE)
ENDOLOOP SUT PDS II 0 18 (SUTURE) IMPLANT
EVACUATOR SILICONE 100CC (DRAIN) IMPLANT
GAUZE SPONGE 4X4 12PLY STRL (GAUZE/BANDAGES/DRESSINGS) IMPLANT
GLOVE BIO SURGEON STRL SZ 6.5 (GLOVE) ×12 IMPLANT
GLOVE BIO SURGEONS STRL SZ 6.5 (GLOVE) ×3
GLOVE BIOGEL PI IND STRL 7.0 (GLOVE) ×9 IMPLANT
GLOVE BIOGEL PI INDICATOR 7.0 (GLOVE) ×6
GLOVE SURG SS PI 8.0 STRL IVOR (GLOVE) IMPLANT
GOWN STRL REUS W/TWL 2XL LVL3 (GOWN DISPOSABLE) ×15 IMPLANT
GOWN STRL REUS W/TWL XL LVL3 (GOWN DISPOSABLE) ×25 IMPLANT
GRASPER ENDOPATH ANVIL 10MM (MISCELLANEOUS) IMPLANT
GRASPER SUT TROCAR 14GX15 (MISCELLANEOUS) IMPLANT
GUIDEWIRE STR DUAL SENSOR (WIRE) ×5 IMPLANT
HOLDER FOLEY CATH W/STRAP (MISCELLANEOUS) ×5 IMPLANT
IRRIG SUCT STRYKERFLOW 2 WTIP (MISCELLANEOUS) ×5
IRRIGATION SUCT STRKRFLW 2 WTP (MISCELLANEOUS) ×3 IMPLANT
IRRIGATOR SUCT 8 DISP DVNC XI (IRRIGATION / IRRIGATOR) ×3 IMPLANT
IRRIGATOR SUCTION 8MM XI DISP (IRRIGATION / IRRIGATOR) ×2
KIT PROCEDURE DA VINCI SI (MISCELLANEOUS) ×2
KIT PROCEDURE DVNC SI (MISCELLANEOUS) ×3 IMPLANT
MANIFOLD NEPTUNE II (INSTRUMENTS) ×5 IMPLANT
NDL INSUFFLATION 14GA 120MM (NEEDLE) ×3 IMPLANT
NEEDLE INSUFFLATION 14GA 120MM (NEEDLE) ×5 IMPLANT
PACK CARDIOVASCULAR III (CUSTOM PROCEDURE TRAY) ×5 IMPLANT
PACK COLON (CUSTOM PROCEDURE TRAY) ×5 IMPLANT
PACK CYSTO (CUSTOM PROCEDURE TRAY) ×5 IMPLANT
PORT LAP GEL ALEXIS MED 5-9CM (MISCELLANEOUS) ×2 IMPLANT
RELOAD STAPLE 45 BLU REG DVNC (STAPLE) IMPLANT
RELOAD STAPLE 45 GRN THCK DVNC (STAPLE) IMPLANT
RETRACTOR WND ALEXIS 18 MED (MISCELLANEOUS) IMPLANT
RTRCTR WOUND ALEXIS 18CM MED (MISCELLANEOUS)
SCISSORS LAP 5X35 DISP (ENDOMECHANICALS) ×5 IMPLANT
SEAL CANN UNIV 5-8 DVNC XI (MISCELLANEOUS) ×9 IMPLANT
SEAL XI 5MM-8MM UNIVERSAL (MISCELLANEOUS) ×8
SEALER VESSEL DA VINCI XI (MISCELLANEOUS) ×2
SEALER VESSEL EXT DVNC XI (MISCELLANEOUS) ×3 IMPLANT
SLEEVE ADV FIXATION 5X100MM (TROCAR) IMPLANT
SOLUTION ELECTROLUBE (MISCELLANEOUS) ×5 IMPLANT
STAPLER 45 BLU RELOAD XI (STAPLE) IMPLANT
STAPLER 45 BLUE RELOAD XI (STAPLE)
STAPLER 45 GREEN RELOAD XI (STAPLE) ×2
STAPLER 45 GRN RELOAD XI (STAPLE) ×3 IMPLANT
STAPLER CANNULA SEAL DVNC XI (STAPLE) ×3 IMPLANT
STAPLER CANNULA SEAL XI (STAPLE) ×2
STAPLER CIRC CVD 29MM 37CM (STAPLE) ×2 IMPLANT
STAPLER SHEATH (SHEATH) ×2
STAPLER SHEATH ENDOWRIST DVNC (SHEATH) ×3 IMPLANT
STAPLER VISISTAT 35W (STAPLE) IMPLANT
SUT ETHILON 2 0 PS N (SUTURE) IMPLANT
SUT NOVA NAB DX-16 0-1 5-0 T12 (SUTURE) ×4 IMPLANT
SUT NOVA NAB GS-21 0 18 T12 DT (SUTURE) IMPLANT
SUT NOVA NAB GS-21 1 T12 (SUTURE) IMPLANT
SUT PDS AB 1 CTX 36 (SUTURE) IMPLANT
SUT PDS AB 1 TP1 96 (SUTURE) IMPLANT
SUT PROLENE 2 0 KS (SUTURE) ×5 IMPLANT
SUT SILK 2 0 (SUTURE) ×5
SUT SILK 2 0 SH CR/8 (SUTURE) ×5 IMPLANT
SUT SILK 2-0 18XBRD TIE 12 (SUTURE) ×3 IMPLANT
SUT SILK 3 0 (SUTURE) ×5
SUT SILK 3 0 SH CR/8 (SUTURE) ×5 IMPLANT
SUT SILK 3-0 18XBRD TIE 12 (SUTURE) ×3 IMPLANT
SUT V-LOC BARB 180 2/0GR6 GS22 (SUTURE)
SUT VIC AB 2-0 SH 18 (SUTURE) ×7 IMPLANT
SUT VIC AB 2-0 SH 27 (SUTURE) ×5
SUT VIC AB 2-0 SH 27X BRD (SUTURE) ×3 IMPLANT
SUT VIC AB 3-0 SH 18 (SUTURE) ×5 IMPLANT
SUT VIC AB 4-0 PS2 18 (SUTURE) ×10 IMPLANT
SUT VIC AB 4-0 PS2 27 (SUTURE) ×10 IMPLANT
SUT VICRYL 0 UR6 27IN ABS (SUTURE) ×5 IMPLANT
SUTURE V-LC BRB 180 2/0GR6GS22 (SUTURE) IMPLANT
SYR 10ML ECCENTRIC (SYRINGE) ×5 IMPLANT
SYS LAPSCP GELPORT 120MM (MISCELLANEOUS)
SYSTEM LAPSCP GELPORT 120MM (MISCELLANEOUS) IMPLANT
TOWEL OR 17X26 10 PK STRL BLUE (TOWEL DISPOSABLE) ×5 IMPLANT
TOWEL OR NON WOVEN STRL DISP B (DISPOSABLE) ×5 IMPLANT
TRAY FOLEY CATH 14FRSI W/METER (CATHETERS) ×2 IMPLANT
TRAY FOLEY W/METER SILVER 16FR (SET/KITS/TRAYS/PACK) ×5 IMPLANT
TROCAR ADV FIXATION 5X100MM (TROCAR) ×5 IMPLANT
TUBING CONNECTING 10 (TUBING) ×12 IMPLANT
TUBING CONNECTING 10' (TUBING) ×3
TUBING INSUFFLATION 10FT LAP (TUBING) ×5 IMPLANT

## 2017-03-30 NOTE — Transfer of Care (Signed)
Immediate Anesthesia Transfer of Care Note  Patient: Leah Peterson  Procedure(s) Performed: XI ROBOT ASSISTED SIGMOIDECTOMY (N/A Abdomen) CYSTOSCOPY WITH BILATERAL STENT PLACEMENT (Bilateral Ureter) XI ROBOTIC ASSISTED BILATERAL SALPINGO OOPHORECTOMY (Bilateral Abdomen)  Patient Location: PACU  Anesthesia Type:General  Level of Consciousness: awake  Airway & Oxygen Therapy: Patient Spontanous Breathing and Patient connected to face mask oxygen  Post-op Assessment: Report given to RN and Post -op Vital signs reviewed and stable  Post vital signs: Reviewed and stable  Last Vitals:  Vitals:   03/30/17 0639  BP: 124/71  Pulse: 71  Resp: 18  Temp: 36.6 C  SpO2: 100%    Last Pain:  Vitals:   03/30/17 0639  TempSrc: Oral  PainSc:       Patients Stated Pain Goal: 4 (46/65/99 [id number])  Complications: No apparent anesthesia complications

## 2017-03-30 NOTE — Anesthesia Preprocedure Evaluation (Signed)
Anesthesia Evaluation  Patient identified by MRN, date of birth, ID band Patient awake    Reviewed: Allergy & Precautions, H&P , Patient's Chart, lab work & pertinent test results, reviewed documented beta blocker date and time   Airway Mallampati: II  TM Distance: >3 FB Neck ROM: full    Dental no notable dental hx.    Pulmonary former smoker,    Pulmonary exam normal breath sounds clear to auscultation       Cardiovascular hypertension,  Rhythm:regular Rate:Normal     Neuro/Psych    GI/Hepatic   Endo/Other    Renal/GU      Musculoskeletal   Abdominal   Peds  Hematology   Anesthesia Other Findings Good LV fxn; Normal valves MO HTN  Reproductive/Obstetrics                             Anesthesia Physical Anesthesia Plan  ASA: III  Anesthesia Plan: General   Post-op Pain Management:    Induction: Intravenous  PONV Risk Score and Plan: 2 and Dexamethasone, Ondansetron and Treatment may vary due to age or medical condition  Airway Management Planned: Oral ETT  Additional Equipment:   Intra-op Plan:   Post-operative Plan: Extubation in OR  Informed Consent: I have reviewed the patients History and Physical, chart, labs and discussed the procedure including the risks, benefits and alternatives for the proposed anesthesia with the patient or authorized representative who has indicated his/her understanding and acceptance.   Dental Advisory Given  Plan Discussed with: CRNA and Surgeon  Anesthesia Plan Comments: (  )        Anesthesia Quick Evaluation

## 2017-03-30 NOTE — Op Note (Signed)
Procedure: Cystoscopy with bilateral ureteral catheterization and injection of firefly.  Preop diagnosis: Diverticular stricture.  Postop diagnosis: Same.  Surgeon: Dr. Irine Seal.  Anesthesia: General.  Drains: Bilateral 5 French ureteral catheters and 14 French Foley catheter.  EBL: None.  Specimen: None.  Complications: None.  Indications: Dr. Marcello Moores has requested placement of ureteral catheters and injection of firefly prior to robotic surgery for a diverticular stricture and bilateral oophorectomy.  Procedure: She received Ceftin.  A general anesthetic was induced and she was fitted with PAS hose.  She was placed in the lithotomy position.  Her perineum and genitalia were prepped with Betadine solution and she was draped in the usual sterile fashion.  Cystoscopy was performed using the 23 [place] scope and 30 degree lens.  The urethra was normal.  The bladder wall had mild trabeculation but a normal mucosa.  The ureteral orifice ease were in their normal anatomic position.  A 5 French left ureteral catheter was placed under fluoroscopic guidance to the kidney.   7 mL firefly was injected. A 5 French right ureteral catheter was placed under fluoroscopic guidance to the kidney  7 mL of firefly was injected.  A 14 French Foley catheter was inserted.  The ureteral catheters and the Foley were connected to a [place] device.  The ureteral catheters were secured to the Foley with 2-0 silk ties.  The [place] device was placed to straight drainage.  The cystoscope was removed after the bladder was drained.  There were no complications.

## 2017-03-30 NOTE — Anesthesia Procedure Notes (Signed)
Procedure Name: Intubation Date/Time: 03/30/2017 8:39 AM Performed by: Sharlette Dense, CRNA Patient Re-evaluated:Patient Re-evaluated prior to induction Oxygen Delivery Method: Circle system utilized Preoxygenation: Pre-oxygenation with 100% oxygen Induction Type: IV induction Ventilation: Mask ventilation without difficulty and Oral airway inserted - appropriate to patient size Laryngoscope Size: Sabra Heck and 2 Grade View: Grade I Tube type: Oral Tube size: 7.5 mm Number of attempts: 1 Airway Equipment and Method: Stylet Placement Confirmation: ETT inserted through vocal cords under direct vision,  positive ETCO2 and breath sounds checked- equal and bilateral Secured at: 21 cm Tube secured with: Tape Dental Injury: Teeth and Oropharynx as per pre-operative assessment

## 2017-03-30 NOTE — H&P (Signed)
Day of Surgery  Subjective: Leah Peterson has a diverticular stricture and Dr. Marcello Moores has requested ureteral catheters and firefly injection.    ROS:  ROS  Anti-infectives: Anti-infectives (From admission, onward)   Start     Dose/Rate Route Frequency Ordered Stop   03/30/17 0802  cefoTEtan in Dextrose 5% (CEFOTAN) 2-2.08 GM-%(50ML) IVPB    Comments:  Danley Danker   : cabinet override      03/30/17 0802 03/30/17 2014   03/30/17 0614  cefoTEtan in Dextrose 5% (CEFOTAN) IVPB 2 g     2 g Intravenous On call to O.R. 03/30/17 0614 03/31/17 0559      Current Facility-Administered Medications  Medication Dose Route Frequency Provider Last Rate Last Dose  . bupivacaine liposome (EXPAREL) 1.3 % injection 266 mg  20 mL Infiltration On Call to OR Leighton Ruff, MD      . cefoTEtan in Dextrose 5% (CEFOTAN) 2-2.08 GM-%(50ML) IVPB           . cefoTEtan in Dextrose 5% (CEFOTAN) IVPB 2 g  2 g Intravenous On Call to OR Leighton Ruff, MD      . Chlorhexidine Gluconate Cloth 2 % PADS 6 each  6 each Topical Once Leighton Ruff, MD       And  . Chlorhexidine Gluconate Cloth 2 % PADS 6 each  6 each Topical Once Leighton Ruff, MD       Facility-Administered Medications Ordered in Other Encounters  Medication Dose Route Frequency Provider Last Rate Last Dose  . lactated ringers infusion    Continuous PRN Sharlette Dense, CRNA         Objective: Vital signs in last 24 hours: Temp:  [97.9 F (36.6 C)-98.7 F (37.1 C)] 97.9 F (36.6 C) (11/29 0639) Pulse Rate:  [63-71] 71 (11/29 0639) Resp:  [16-18] 18 (11/29 0639) BP: (124-133)/(71-80) 124/71 (11/29 0639) SpO2:  [100 %] 100 % (11/29 0639) Weight:  [115.7 kg (255 lb)-115.9 kg (255 lb 8 oz)] 115.7 kg (255 lb) (11/29 0629)  Intake/Output from previous day: No intake/output data recorded. Intake/Output this shift: No intake/output data recorded.   Physical Exam  Lab Results:  Recent Labs    03/29/17 0852  WBC 3.8*  HGB 12.3  HCT  37.5  PLT 201   BMET Recent Labs    03/29/17 0852  NA 139  K 3.7  CL 104  CO2 28  GLUCOSE 98  BUN 21*  CREATININE 1.09*  CALCIUM 9.1   PT/INR No results for input(s): LABPROT, INR in the last 72 hours. ABG No results for input(s): PHART, HCO3 in the last 72 hours.  Invalid input(s): PCO2, PO2  Studies/Results: No results found.   Assessment and Plan: Diverticular stricture.    I have discussed the risks of cystoscopy and ureteral catheterization including a small risk of bleeding, infection and injury to the urinary tract.        LOS: 0 days    Irine Seal 03/30/2017 [phone]

## 2017-03-30 NOTE — Op Note (Signed)
OPERATIVE NOTE  Date: 03/30/17  Preoperative Diagnosis: left ovarian cyst, history of diverticular abscesses   Postoperative Diagnosis:  Same and dense fibrosis  Procedure(s) Performed: Robotic-assisted laparoscopic bilateral salpingo-oophorectomy  Surgeon: Everitt Amber, M.D.  Assistant Surgeon: Annye English M.D. (an MD assistant was necessary for tissue manipulation, management of robotic instrumentation, retraction and positioning due to the complexity of the case and hospital policies).   Anesthesia: Gen. endotracheal.  Specimens: Bilateral ovaries, fallopian tubes  Estimated Blood Loss: 50 mL. Blood Replacement: None  Complications: none  Indication for Procedure:  Patient had a history of left ovarian mass, and diverticular abscess. Dense fibrotic adhesions between left ovary and sigmoid colon.  Operative Findings: 5cm enlarged, fibrotic left ovary adherent to left round ligament and left pelvic sidewall with apparent left lower quadrant drain tract in place. Normal right tube and ovary.  Procedure: The patient's taken to the operating room with Dr Marcello Moores and Dr Dema Severin for a sigmoid colectomy and these surgeons positioned the patient, placed robotic ports and commended the procedure of sigmoid colectomy.  The ureters had been stented by Dr Jeffie Pollock earlier in the procedure and had ICG injected within the stents for identification with Firefly.   After the sigmoid colon had been skeletonized and mobilized from the ovary (please refer to Dr Manon Hilding note) I proceeded with BSO.  The left pelvic sidewall was opened parallel to the ovarian vessels. A window was created between the ovarian vessels and ureter which skeletonized the ovarian vessels and allowed for bipolar sealing and transection of the ovarian vessels.   The ovarian vessels were then elevated and the adnexa was carefully skeletonized off of the underlying ureter.  The lateral side wall adhesive connections were skeletonized  and freed with monopolar energy. The round ligament was transected and the side wall attachments were completely mobilized in doing so the drain tract was transected. This skeletonized the left utero-ovarian ligament which was skeletonized and bipolar sealed and transected. The ovary was left in the anterior cul de sac for later retrieval.   In a similar manner the right peritoneum and the side wall was incised, and the retroperitoneal space entered. The right ureter was identified and the right pararectal space was developed. The utero-ovarian ligament was skeletonized cauterized and transected. The right utero-ovarian ligaments were cauterized and transected in the right adnexa was placed in the anterior cul de sac.  The abdomen was copiously irrigated and drained and all operative sites inspected and hemostasis was assured  The procedure then continued with Dr Marcello Moores to complete the sigmoid colectomy. Dr Marcello Moores removed the ovaries and tubes from the abdomen at the completion of the procedure and sent for pathology.   The patient had sequential compression devices for VTE prophylaxis.         Disposition: case continued with Dr Marcello Moores         Condition: stable  [name], MD

## 2017-03-30 NOTE — Op Note (Addendum)
03/30/2017  1:20 PM  PATIENT:  Leah Peterson  61 y.o. female  Patient Care Team: Ria Bush, MD as PCP - General (Family Medicine) Marygrace Drought, MD as Consulting Physician (Ophthalmology)  PRE-OPERATIVE DIAGNOSIS:  diverticular stricture  POST-OPERATIVE DIAGNOSIS:  diverticular stricture  PROCEDURE:   XI ROBOT ASSISTED SIGMOIDECTOMY WITH SPLENIC FLEXURE MOBILIZATION  CYSTOSCOPY WITH BILATERAL STENT PLACEMENT XI ROBOTIC ASSISTED BILATERAL SALPINGO OOPHORECTOMY   Surgeon(s): Everitt Amber, MD Irine Seal, MD Ileana Roup, MD Leighton Ruff, MD  ASSISTANT: Dr Dema Severin   ANESTHESIA:   local and general  EBL: 271ml  Total I/O In: 2000 [I.V.:2000] Out: 250 [Urine:50; Blood:200]  Delay start of Pharmacological VTE agent (>24hrs) due to surgical blood loss or risk of bleeding:  no  DRAINS: none   SPECIMEN:  Source of Specimen:  Sigmoid colon  DISPOSITION OF SPECIMEN:  PATHOLOGY  COUNTS:  YES  PLAN OF CARE: Admit to inpatient   PATIENT DISPOSITION:  PACU - hemodynamically stable.  INDICATION:    61 year old female who was undergoing workup for left lower quadrant pain with her gynecologist.  Dr. Denman George recommended hysterectomy and she was scheduled for a robotic resection.  Upon entrance into the abdomen she was noted to have a thickened colon that was severely inflamed.  A CT scan after the aborted procedure showed a sigmoid colon abscess which was drained in interventional radiology.  She is now approximately 3 months from this event and she is here today for resection of her ovaries and sigmoid colon.  I recommended segmental resection:  The anatomy & physiology of the digestive tract was discussed.  The pathophysiology was discussed.  Natural history risks without surgery was discussed.   I worked to give an overview of the disease and the frequent need to have multispecialty involvement.  I feel the risks of no intervention will lead to serious problems  that outweigh the operative risks; therefore, I recommended a partial colectomy to remove the pathology.  Laparoscopic & open techniques were discussed.   Risks such as bleeding, infection, abscess, leak, reoperation, possible ostomy, hernia, heart attack, death, and other risks were discussed.  I noted a good likelihood this will help address the problem.   Goals of post-operative recovery were discussed as well.    The patient expressed understanding & wished to proceed with surgery.  OR FINDINGS:   Patient had sigmoid colon stricture and lateral abscess cavity  Patient has a small epigastric hernia which was not taken down or repaired.  The anastomosis rests 18 cm from the anal verge by rigid proctoscopy.  DESCRIPTION:   Informed consent was confirmed.  The patient underwent general anaesthesia without difficulty.  The patient was positioned appropriately.  VTE prevention in place.  The patient's abdomen was clipped, prepped, & draped in a sterile fashion.  Surgical timeout confirmed our plan.  The patient was positioned in reverse Trendelenburg.  Abdominal entry was gained using a varies needle in the left upper quadrant.  Entry was clean.  I induced carbon dioxide insufflation.  An 84mm robotic port was placed in the quadrant.  Camera inspection revealed no injury.  Extra ports were carefully placed under direct laparoscopic visualization.  I laparoscopically reflected the greater omentum and the upper abdomen the small bowel in the upper abdomen. The patient was appropriately positioned and the robot was docked to the patient's left side.  Instruments were placed under direct visualization.   Sigmoid colon was densely adherent to the left pelvic sidewall.  I  began with a medial to lateral dissection.  I opened up the right peritoneum of the mesentery of the left colon from the ligament of Treitz to the peritoneal reflection of the mid rectum.   I elevated the sigmoid mesentery and enetered  into the retro-mesenteric plane. We were able to identify the left ureter and gonadal vessels via the previously injected indocyanine green. We kept those posterior within the retroperitoneum and elevated the left colon mesentery off that. I did isolated IMA pedicle but did not ligate it yet.  I continued distally and got into the avascular plane posterior to the mesorectum. This allowed me to help mobilize the rectum as well by freeing the mesorectum off the sacrum.  I mobilized the peritoneal coverings towards the peritoneal reflection on both the right and left sides of the rectum.  I could see the right and left ureters and stayed away from them.  I skeletonized the inferior mesenteric artery pedicle.  I went down to its takeoff from the aorta.  I divided this with the robotic vessel sealer device will above its takeoff from the aorta.  I did ligate the inferior mesenteric vein in a similar fashion.   I then turned the procedure over to Dr. Denman George who proceeded to perform bilateral oophorectomy and salpingectomy.  This will be dictated separately.  Once this was completed, the pelvis was inspected for hemostasis.  There was no obvious bleeding.  We identified the right and left ureter once again and saw no sign of injury.  I skeletonized the mesorectum at the junction at the proximal rectum using blunt dissection & bipolar robotic vessel sealer.  I mobilized the left colon in a lateral to medial fashion off the line of Toldt up towards the splenic flexure to ensure good mobilization of the left colon to reach into the pelvis.  I was not able to get normal colon to mobilize to the pelvis.  We then used the robotic table motion to place the patient in left side up position.  The splenic flexure was taken down using the robotic vessel sealer.  This allowed for the colon to reach into the pelvis.  We then placed the patient back in Trendelenburg position and divided the rectosigmoid junction using a green load  robotic stapler through a suprapubic 12 mm robotic port.  We then desufflated the abdomen and enlarged to 12 mm suprapubic port into an Pfannenstiel incision.  An [place] wound protector was placed.  Both ovaries were removed from the abdomen as they had been previously resected by Dr. Denman George.  Once this was complete the end of the sigmoid colon was brought out through the wound protector.  We mobilized the mesentery off of a distal portion of descending colon and placed the pursestring device.  The colon was then divided with electrocautery.  I then tied the pursestring suture around a 29 mm EEA anvil.  This was then placed back into the abdomen and the abdomen was reinsufflated.  An end-to-end colorectal anastomosis was performed via laparoscopic guidance.  There was no leak when tested with insufflation under water.  There was no tension on the anastomosis.  The abdomen was then irrigated with 1 L of normal saline.  The both ureteral stents were removed at this time without difficulty.  We then switched to clean gown, gloves, drapes and instruments.  The saline was aspirated out of the abdomen and the peritoneum of the Pfannenstiel incision was closed using a running 2-0 Vicryl suture.  The fascia was then closed using interrupted #1 Novafil sutures.  The subcutaneous tissue was reapproximated using interrupted 2-0 Vicryl sutures and the skin was closed using a running 4-0 subcuticular Vicryl suture.  The remaining port sites were also closed using a 4-0 Vicryl suture and Dermabond.  The patient was then awakened from anesthesia and sent to the postanesthesia care unit in stable condition.  All counts were correct per operating room staff.  An MD assistant was necessary for tissue manipulation, retraction and positioning due to the complexity of the case and hospital policies

## 2017-03-31 ENCOUNTER — Encounter (HOSPITAL_COMMUNITY): Payer: Self-pay | Admitting: General Surgery

## 2017-03-31 LAB — CBC
HCT: 35.8 % — ABNORMAL LOW (ref 36.0–46.0)
Hemoglobin: 11.8 g/dL — ABNORMAL LOW (ref 12.0–15.0)
MCH: 27.9 pg (ref 26.0–34.0)
MCHC: 33 g/dL (ref 30.0–36.0)
MCV: 84.6 fL (ref 78.0–100.0)
PLATELETS: 218 10*3/uL (ref 150–400)
RBC: 4.23 MIL/uL (ref 3.87–5.11)
RDW: 13.2 % (ref 11.5–15.5)
WBC: 6.5 10*3/uL (ref 4.0–10.5)

## 2017-03-31 LAB — BASIC METABOLIC PANEL
Anion gap: 9 (ref 5–15)
BUN: 13 mg/dL (ref 6–20)
CHLORIDE: 103 mmol/L (ref 101–111)
CO2: 27 mmol/L (ref 22–32)
Calcium: 8.8 mg/dL — ABNORMAL LOW (ref 8.9–10.3)
Creatinine, Ser: 1.22 mg/dL — ABNORMAL HIGH (ref 0.44–1.00)
GFR, EST AFRICAN AMERICAN: 55 mL/min — AB (ref >60)
GFR, EST NON AFRICAN AMERICAN: 48 mL/min — AB (ref >60)
Glucose, Bld: 136 mg/dL — ABNORMAL HIGH (ref 65–99)
Potassium: 3.7 mmol/L (ref 3.5–5.1)
SODIUM: 139 mmol/L (ref 135–145)

## 2017-03-31 NOTE — Evaluation (Signed)
Physical Therapy Evaluation Patient Details Name: Leah Peterson MRN: [id number] DOB: 06/18/1955 Today's Date: 03/31/2017   History of Present Illness  61 yo female s/p lap bilateral salpingo-oophorectomy, sigmoidectomy, stent placement 11/29.   Clinical Impression  On eval, pt was Min guard assist for mobility. She walked ~1000 feet (~ 2laps) around the unit. Pain rated 2/10 with activity. Encouraged pt to continue ambulating in hallways as tolerated. Do not anticipate any f/u PT needs. Will follow during hospital stay.     Follow Up Recommendations No PT follow up    Equipment Recommendations  None recommended by PT    Recommendations for Other Services       Precautions / Restrictions Precautions Precautions: None Precaution Comments: abd surgery Restrictions Weight Bearing Restrictions: No      Mobility  Bed Mobility               General bed mobility comments: oob in recliner  Transfers Overall transfer level: Needs assistance   Transfers: Sit to/from Stand Sit to Stand: Supervision         General transfer comment: for safety. increased time. pt rocks to gain momentum to rise.   Ambulation/Gait Ambulation/Gait assistance: Min guard Ambulation Distance (Feet): 1000 Feet Assistive device: (IV pole) Gait Pattern/deviations: Step-through pattern     General Gait Details: close guard for safety.   Stairs            Wheelchair Mobility    Modified Rankin (Stroke Patients Only)       Balance                                             Pertinent Vitals/Pain Pain Assessment: 0-10 Pain Score: 2  Pain Location: abdomen Pain Descriptors / Indicators: Grimacing;Sore;Guarding Pain Intervention(s): Monitored during session;Repositioned    Home Living Family/patient expects to be discharged to:: Private residence Living Arrangements: Alone Available Help at Discharge: Family Type of Home: House Home Access: Stairs  to enter Entrance Stairs-Rails: None Technical brewer of Steps: 2 Home Layout: Able to live on main level with bedroom/bathroom Home Equipment: None      Prior Function Level of Independence: Independent               Hand Dominance        Extremity/Trunk Assessment   Upper Extremity Assessment Upper Extremity Assessment: Overall WFL for tasks assessed    Lower Extremity Assessment Lower Extremity Assessment: Overall WFL for tasks assessed       Communication   Communication: No difficulties  Cognition Arousal/Alertness: Awake/alert Behavior During Therapy: WFL for tasks assessed/performed Overall Cognitive Status: Within Functional Limits for tasks assessed                                        General Comments      Exercises     Assessment/Plan    PT Assessment Patient needs continued PT services  PT Problem List Decreased mobility;Pain       PT Treatment Interventions Gait training;Therapeutic activities;Therapeutic exercise;Patient/family education;Functional mobility training    PT Goals (Current goals can be found in the Care Plan section)  Acute Rehab PT Goals Patient Stated Goal: regain plof PT Goal Formulation: With patient Time For Goal Achievement: 04/14/17 Potential to Achieve Goals: Good  Frequency Min 3X/week   Barriers to discharge        Co-evaluation               AM-PAC PT "6 Clicks" Daily Activity  Outcome Measure Difficulty turning over in bed (including adjusting bedclothes, sheets and blankets)?: A Lot Difficulty moving from lying on back to sitting on the side of the bed? : A Lot Difficulty sitting down on and standing up from a chair with arms (e.g., wheelchair, bedside commode, etc,.)?: A Little Help needed moving to and from a bed to chair (including a wheelchair)?: A Little Help needed walking in hospital room?: A Little Help needed climbing 3-5 steps with a railing? : A Little 6  Click Score: 16    End of Session   Activity Tolerance: Patient tolerated treatment well Patient left: in chair;with call bell/phone within reach;with family/visitor present   PT Visit Diagnosis: Difficulty in walking, not elsewhere classified (R26.2)    Time: 2482-5003 PT Time Calculation (min) (ACUTE ONLY): 20 min   Charges:   PT Evaluation $PT Eval Moderate Complexity: 1 Mod     PT G Codes:          Weston Anna, MPT Pager: [phone]

## 2017-03-31 NOTE — Progress Notes (Signed)
1 Day Post-Op Procedure(s) (LRB): XI ROBOT ASSISTED SIGMOIDECTOMY (N/A) CYSTOSCOPY WITH BILATERAL STENT PLACEMENT (Bilateral) XI ROBOTIC ASSISTED BILATERAL SALPINGO OOPHORECTOMY (Bilateral)  Subjective: Patient reports minimal pain this am of a 2.  She is tolerated ice chips with no nausea or emesis reported.  Denies chest pain, dyspnea, passing flatus.  No concerns voiced.    Objective: Vital signs in last 24 hours: Temp:  [97.3 F (36.3 C)-98.9 F (37.2 C)] 98.9 F (37.2 C) (11/30 0507) Pulse Rate:  [68-77] 71 (11/30 0507) Resp:  [14-28] 16 (11/30 0507) BP: (111-165)/(67-100) 111/71 (11/30 0507) SpO2:  [99 %-100 %] 100 % (11/30 0507) Last BM Date: 03/29/17  Intake/Output from previous day: 11/29 0701 - 11/30 0700 In: 3773.8 [I.V.:3673.8; IV Piggyback:100] Out: 2285 [Urine:2085; Blood:200]  Physical Examination: General: alert, cooperative and no distress Resp: clear to auscultation bilaterally Cardio: regular rate and rhythm, S1, S2 normal, no murmur, click, rub or gallop GI: incision: lap sites to the abdomen with dermabond without erythema or drainage, low transverse incision with honeycomb dressing in place and abdomen obese, hypoactive bowel sounds, abdomen soft Extremities: extremities normal, atraumatic, no cyanosis or edema  Labs: WBC/Hgb/Hct/Plts:  6.5/11.8/35.8/218 (11/30 0544) BUN/Cr/glu/ALT/AST/amyl/lip:  13/1.22/--/--/--/--/-- (11/30 9628)  Assessment: 61 y.o. s/p Procedure(s): XI ROBOT ASSISTED SIGMOIDECTOMY CYSTOSCOPY WITH BILATERAL STENT PLACEMENT XI ROBOTIC ASSISTED BILATERAL SALPINGO OOPHORECTOMY: stable Pain:  Pain is well-controlled on PRN medications.  Heme: Hgb 11.8 and Hct 35.8 this am.  Stable post-op.  CV: BP and HR stable post-op.  Continue to monitor with ordered vital sign assessments. Norvasc and HCTZ ordered.  GI:  Tolerating po: No: NPO with ice chips.     GU: Foley in place draining adequate urine. Creatinine 1.22 this am.    FEN:  Stable post-operatively.   Prophylaxis: Lovenox ordered.  SCDs.  Plan: Continue plan of care per Dr. Marcello Moores Encourage ambulation, IS use, deep breathing, and coughing   LOS: 1 day    CROSS, MELISSA DEAL 03/31/2017, 10:03 AM

## 2017-03-31 NOTE — Progress Notes (Signed)
1 Day Post-Op robotic sigmoidectomy, BSO Subjective: No acute events overnight, no nausea  Objective: Vital signs in last 24 hours: Temp:  [97.3 F (36.3 C)-98.9 F (37.2 C)] 98.9 F (37.2 C) (11/30 0507) Pulse Rate:  [68-77] 71 (11/30 0507) Resp:  [14-28] 16 (11/30 0507) BP: (111-165)/(67-100) 111/71 (11/30 0507) SpO2:  [99 %-100 %] 100 % (11/30 0507)   Intake/Output from previous day: 11/29 0701 - 11/30 0700 In: 3773.8 [I.V.:3673.8; IV Piggyback:100] Out: 2285 [Urine:2085; Blood:200] Intake/Output this shift: No intake/output data recorded.   General appearance: alert and cooperative GI: normal findings: soft, non-tender  Incision: no significant drainage  Lab Results:  Recent Labs    03/29/17 0852 03/31/17 0544  WBC 3.8* 6.5  HGB 12.3 11.8*  HCT 37.5 35.8*  PLT 201 218   BMET Recent Labs    03/29/17 0852 03/31/17 0544  NA 139 139  K 3.7 3.7  CL 104 103  CO2 28 27  GLUCOSE 98 136*  BUN 21* 13  CREATININE 1.09* 1.22*  CALCIUM 9.1 8.8*   PT/INR No results for input(s): LABPROT, INR in the last 72 hours. ABG No results for input(s): PHART, HCO3 in the last 72 hours.  Invalid input(s): PCO2, PO2  MEDS, Scheduled . acetaminophen  1,000 mg Oral Q6H  . alvimopan  12 mg Oral BID  . amLODipine  5 mg Oral Daily  . enoxaparin (LOVENOX) injection  40 mg Subcutaneous Q24H  . hydrochlorothiazide  25 mg Oral Daily  . saccharomyces boulardii  250 mg Oral BID    Studies/Results: Dg C-arm 1-60 Min-no Report  Result Date: 03/30/2017 Fluoroscopy was utilized by the requesting physician.  No radiographic interpretation.    Assessment: s/p Procedure(s): XI ROBOT ASSISTED SIGMOIDECTOMY CYSTOSCOPY WITH BILATERAL STENT PLACEMENT XI ROBOTIC ASSISTED BILATERAL SALPINGO OOPHORECTOMY Patient Active Problem List   Diagnosis Date Noted  . Diverticular disease 03/30/2017  . Cyst of left ovary   . Diverticulosis of colon (without mention of hemorrhage) 01/16/2017   . Diverticulitis of large intestine without perforation or abscess without bleeding 01/16/2017  . Ovarian mass, left 11/16/2016  . Uterine leiomyoma 11/16/2016  . Abdominal pain 11/11/2016  . Glaucoma 09/30/2016  . Ventral hernia 04/18/2016  . Persistent vomiting 10/14/2015  . Bilateral leg paresthesia 04/10/2015  . Onychomycosis 01/09/2015  . DVT of lower extremity, bilateral ([place]) 10/16/2013  . Abscess of abdominal cavity ([place]) 10/14/2013  . Intertrigo 09/04/2013  . History of diverticulitis 07/31/2013  . Healthcare maintenance 09/14/2011  . Vitamin D deficiency 09/02/2009  . KNEE PAIN, LEFT 09/02/2009  . Prediabetes 12/31/2008  . Diverticulosis of large intestine 08/27/2008  . IBS 04/02/2008  . HYPERTENSION, BENIGN ESSENTIAL 06/18/2007  . HLD (hyperlipidemia) 06/12/2007  . Morbid obesity with BMI of 50.0-59.9, adult ([place]) 06/11/2007    Expected post op course  Plan: Advance diet to clears Cont foley today Ambulate in hall Decrease MIV   LOS: 1 day     .Rosario Adie, [facility], [place]   03/31/2017 9:49 AM

## 2017-04-01 LAB — BASIC METABOLIC PANEL
ANION GAP: 4 — AB (ref 5–15)
BUN: 10 mg/dL (ref 6–20)
CO2: 30 mmol/L (ref 22–32)
Calcium: 8.7 mg/dL — ABNORMAL LOW (ref 8.9–10.3)
Chloride: 104 mmol/L (ref 101–111)
Creatinine, Ser: 1.12 mg/dL — ABNORMAL HIGH (ref 0.44–1.00)
GFR calc Af Amer: >60 mL/min (ref >60)
GFR calc non Af Amer: 53 mL/min — ABNORMAL LOW (ref >60)
GLUCOSE: 104 mg/dL — AB (ref 65–99)
POTASSIUM: 3.5 mmol/L (ref 3.5–5.1)
Sodium: 138 mmol/L (ref 135–145)

## 2017-04-01 LAB — CBC
HEMATOCRIT: 33.3 % — AB (ref 36.0–46.0)
HEMOGLOBIN: 10.6 g/dL — AB (ref 12.0–15.0)
MCH: 27.5 pg (ref 26.0–34.0)
MCHC: 31.8 g/dL (ref 30.0–36.0)
MCV: 86.3 fL (ref 78.0–100.0)
Platelets: 196 10*3/uL (ref 150–400)
RBC: 3.86 MIL/uL — ABNORMAL LOW (ref 3.87–5.11)
RDW: 13.5 % (ref 11.5–15.5)
WBC: 5.7 10*3/uL (ref 4.0–10.5)

## 2017-04-01 NOTE — Progress Notes (Signed)
[facility] Progress Note:   2 Days Post-Op  Subjective: Mental status is clear.  Sitting up in chair.  Passing flatus and BMs Objective: Vital signs in last 24 hours: Temp:  [98 F (36.7 C)-98.7 F (37.1 C)] 98.2 F (36.8 C) (12/01 0510) Pulse Rate:  [68-87] 84 (12/01 0510) Resp:  [16-18] 18 (12/01 0510) BP: (109-139)/(56-76) 123/72 (12/01 0510) SpO2:  [97 %-100 %] 97 % (12/01 0510)  Intake/Output from previous day: 11/30 0701 - 12/01 0700 In: 2427.5 [P.O.:840; I.V.:1587.5] Out: 2700 [Urine:2700] Intake/Output this shift: Total I/O In: -  Out: 200 [Urine:200]  Physical Exam: Work of breathing is normal.  No abdominal complaints  Lab Results:  Results for orders placed or performed during the hospital encounter of 03/30/17 (from the past 48 hour(s))  Basic metabolic panel     Status: Abnormal   Collection Time: 03/31/17  5:44 AM  Result Value Ref Range   Sodium 139 135 - 145 mmol/L   Potassium 3.7 3.5 - 5.1 mmol/L   Chloride 103 101 - 111 mmol/L   CO2 27 22 - 32 mmol/L   Glucose, Bld 136 (H) 65 - 99 mg/dL   BUN 13 6 - 20 mg/dL   Creatinine, Ser 1.22 (H) 0.44 - 1.00 mg/dL   Calcium 8.8 (L) 8.9 - 10.3 mg/dL   GFR calc non Af Amer 48 (L) >60 mL/min   GFR calc Af Amer 55 (L) >60 mL/min    Comment: (NOTE) The eGFR has been calculated using the CKD EPI equation. This calculation has not been validated in all clinical situations. eGFR's persistently <60 mL/min signify possible Chronic Kidney Disease.    Anion gap 9 5 - 15  CBC     Status: Abnormal   Collection Time: 03/31/17  5:44 AM  Result Value Ref Range   WBC 6.5 4.0 - 10.5 K/uL   RBC 4.23 3.87 - 5.11 MIL/uL   Hemoglobin 11.8 (L) 12.0 - 15.0 g/dL   HCT 35.8 (L) 36.0 - 46.0 %   MCV 84.6 78.0 - 100.0 fL   MCH 27.9 26.0 - 34.0 pg   MCHC 33.0 30.0 - 36.0 g/dL   RDW 13.2 11.5 - 15.5 %   Platelets 218 150 - 400 K/uL  Basic metabolic panel     Status: Abnormal   Collection Time: 04/01/17  4:35 AM   Result Value Ref Range   Sodium 138 135 - 145 mmol/L   Potassium 3.5 3.5 - 5.1 mmol/L   Chloride 104 101 - 111 mmol/L   CO2 30 22 - 32 mmol/L   Glucose, Bld 104 (H) 65 - 99 mg/dL   BUN 10 6 - 20 mg/dL   Creatinine, Ser 1.12 (H) 0.44 - 1.00 mg/dL   Calcium 8.7 (L) 8.9 - 10.3 mg/dL   GFR calc non Af Amer 53 (L) >60 mL/min   GFR calc Af Amer >60 >60 mL/min    Comment: (NOTE) The eGFR has been calculated using the CKD EPI equation. This calculation has not been validated in all clinical situations. eGFR's persistently <60 mL/min signify possible Chronic Kidney Disease.    Anion gap 4 (L) 5 - 15  CBC     Status: Abnormal   Collection Time: 04/01/17  4:35 AM  Result Value Ref Range   WBC 5.7 4.0 - 10.5 K/uL   RBC 3.86 (L) 3.87 - 5.11 MIL/uL   Hemoglobin 10.6 (L) 12.0 - 15.0 g/dL   HCT 33.3 (L) 36.0 - 46.0 %  MCV 86.3 78.0 - 100.0 fL   MCH 27.5 26.0 - 34.0 pg   MCHC 31.8 30.0 - 36.0 g/dL   RDW 13.5 11.5 - 15.5 %   Platelets 196 150 - 400 K/uL    Radiology/Results: Dg C-arm 1-60 Min-no Report  Result Date: 03/30/2017 Fluoroscopy was utilized by the requesting physician.  No radiographic interpretation.    Anti-infectives: Anti-infectives (From admission, onward)   Start     Dose/Rate Route Frequency Ordered Stop   03/30/17 2100  cefoTEtan (CEFOTAN) 2 g in dextrose 5 % 50 mL IVPB     2 g 100 mL/hr over 30 Minutes Intravenous Every 12 hours 03/30/17 1508 03/30/17 2134   03/30/17 0802  cefoTEtan in Dextrose 5% (CEFOTAN) 2-2.08 GM-%(50ML) IVPB    Comments:  Danley Danker   : cabinet override      03/30/17 0802 03/30/17 0845   03/30/17 0614  cefoTEtan in Dextrose 5% (CEFOTAN) IVPB 2 g     2 g Intravenous On call to O.R. 03/30/17 0614 03/30/17 0900      Assessment/Plan: Problem List: Patient Active Problem List   Diagnosis Date Noted  . Diverticular disease 03/30/2017  . Cyst of left ovary   . Diverticulosis of colon (without mention of hemorrhage) 01/16/2017  .  Diverticulitis of large intestine without perforation or abscess without bleeding 01/16/2017  . Ovarian mass, left 11/16/2016  . Uterine leiomyoma 11/16/2016  . Abdominal pain 11/11/2016  . Glaucoma 09/30/2016  . Ventral hernia 04/18/2016  . Persistent vomiting 10/14/2015  . Bilateral leg paresthesia 04/10/2015  . Onychomycosis 01/09/2015  . DVT of lower extremity, bilateral ([place]) 10/16/2013  . Abscess of abdominal cavity ([place]) 10/14/2013  . Intertrigo 09/04/2013  . History of diverticulitis 07/31/2013  . Healthcare maintenance 09/14/2011  . Vitamin D deficiency 09/02/2009  . KNEE PAIN, LEFT 09/02/2009  . Prediabetes 12/31/2008  . Diverticulosis of large intestine 08/27/2008  . IBS 04/02/2008  . HYPERTENSION, BENIGN ESSENTIAL 06/18/2007  . HLD (hyperlipidemia) 06/12/2007  . Morbid obesity with BMI of 50.0-59.9, adult ([place]) 06/11/2007    Will advance to regular diet.   2 Days Post-Op    LOS: 2 days   Matt B. Hassell Done, MD, [facility] [phone] beeper [phone]  04/01/2017 8:58 AM

## 2017-04-02 ENCOUNTER — Encounter: Payer: Self-pay | Admitting: Family Medicine

## 2017-04-02 LAB — BASIC METABOLIC PANEL
ANION GAP: 7 (ref 5–15)
BUN: 10 mg/dL (ref 6–20)
CO2: 28 mmol/L (ref 22–32)
Calcium: 8.7 mg/dL — ABNORMAL LOW (ref 8.9–10.3)
Chloride: 103 mmol/L (ref 101–111)
Creatinine, Ser: 0.86 mg/dL (ref 0.44–1.00)
GFR calc Af Amer: >60 mL/min (ref >60)
GLUCOSE: 106 mg/dL — AB (ref 65–99)
POTASSIUM: 3.3 mmol/L — AB (ref 3.5–5.1)
Sodium: 138 mmol/L (ref 135–145)

## 2017-04-02 LAB — CBC
HEMATOCRIT: 30.6 % — AB (ref 36.0–46.0)
HEMOGLOBIN: 10.2 g/dL — AB (ref 12.0–15.0)
MCH: 28.7 pg (ref 26.0–34.0)
MCHC: 33.3 g/dL (ref 30.0–36.0)
MCV: 86 fL (ref 78.0–100.0)
Platelets: 183 10*3/uL (ref 150–400)
RBC: 3.56 MIL/uL — ABNORMAL LOW (ref 3.87–5.11)
RDW: 13.4 % (ref 11.5–15.5)
WBC: 5.4 10*3/uL (ref 4.0–10.5)

## 2017-04-02 NOTE — Progress Notes (Signed)
Patient ID: Leah Peterson, female   DOB: December 07, 1955, 61 y.o.   MRN: [id number] [facility] Progress Note:   3 Days Post-Op  Subjective: Mental status is clear.  Up to bathroom and walking around Objective: Vital signs in last 24 hours: Temp:  [98.2 F (36.8 C)-99 F (37.2 C)] 98.2 F (36.8 C) (12/02 0510) Pulse Rate:  [69-87] 69 (12/02 0510) Resp:  [18] 18 (12/02 0510) BP: (116-124)/(70-77) 123/71 (12/02 0510) SpO2:  [99 %] 99 % (12/02 0510)  Intake/Output from previous day: 12/01 0701 - 12/02 0700 In: 2970 [P.O.:1920; I.V.:1050] Out: 2050 [Urine:2050] Intake/Output this shift: No intake/output data recorded.  Physical Exam: Work of breathing is normal.  Pfanstiel incision covered with honeycomb dressing  Lab Results:  Results for orders placed or performed during the hospital encounter of 03/30/17 (from the past 48 hour(s))  Basic metabolic panel     Status: Abnormal   Collection Time: 04/01/17  4:35 AM  Result Value Ref Range   Sodium 138 135 - 145 mmol/L   Potassium 3.5 3.5 - 5.1 mmol/L   Chloride 104 101 - 111 mmol/L   CO2 30 22 - 32 mmol/L   Glucose, Bld 104 (H) 65 - 99 mg/dL   BUN 10 6 - 20 mg/dL   Creatinine, Ser 1.12 (H) 0.44 - 1.00 mg/dL   Calcium 8.7 (L) 8.9 - 10.3 mg/dL   GFR calc non Af Amer 53 (L) >60 mL/min   GFR calc Af Amer >60 >60 mL/min    Comment: (NOTE) The eGFR has been calculated using the CKD EPI equation. This calculation has not been validated in all clinical situations. eGFR's persistently <60 mL/min signify possible Chronic Kidney Disease.    Anion gap 4 (L) 5 - 15  CBC     Status: Abnormal   Collection Time: 04/01/17  4:35 AM  Result Value Ref Range   WBC 5.7 4.0 - 10.5 K/uL   RBC 3.86 (L) 3.87 - 5.11 MIL/uL   Hemoglobin 10.6 (L) 12.0 - 15.0 g/dL   HCT 33.3 (L) 36.0 - 46.0 %   MCV 86.3 78.0 - 100.0 fL   MCH 27.5 26.0 - 34.0 pg   MCHC 31.8 30.0 - 36.0 g/dL   RDW 13.5 11.5 - 15.5 %   Platelets 196 150 - 400 K/uL   Basic metabolic panel     Status: Abnormal   Collection Time: 04/02/17  5:02 AM  Result Value Ref Range   Sodium 138 135 - 145 mmol/L   Potassium 3.3 (L) 3.5 - 5.1 mmol/L   Chloride 103 101 - 111 mmol/L   CO2 28 22 - 32 mmol/L   Glucose, Bld 106 (H) 65 - 99 mg/dL   BUN 10 6 - 20 mg/dL   Creatinine, Ser 0.86 0.44 - 1.00 mg/dL   Calcium 8.7 (L) 8.9 - 10.3 mg/dL   GFR calc non Af Amer >60 >60 mL/min   GFR calc Af Amer >60 >60 mL/min    Comment: (NOTE) The eGFR has been calculated using the CKD EPI equation. This calculation has not been validated in all clinical situations. eGFR's persistently <60 mL/min signify possible Chronic Kidney Disease.    Anion gap 7 5 - 15  CBC     Status: Abnormal   Collection Time: 04/02/17  5:02 AM  Result Value Ref Range   WBC 5.4 4.0 - 10.5 K/uL   RBC 3.56 (L) 3.87 - 5.11 MIL/uL   Hemoglobin 10.2 (L) 12.0 - 15.0  g/dL   HCT 30.6 (L) 36.0 - 46.0 %   MCV 86.0 78.0 - 100.0 fL   MCH 28.7 26.0 - 34.0 pg   MCHC 33.3 30.0 - 36.0 g/dL   RDW 13.4 11.5 - 15.5 %   Platelets 183 150 - 400 K/uL    Radiology/Results: No results found.  Anti-infectives: Anti-infectives (From admission, onward)   Start     Dose/Rate Route Frequency Ordered Stop   03/30/17 2100  cefoTEtan (CEFOTAN) 2 g in dextrose 5 % 50 mL IVPB     2 g 100 mL/hr over 30 Minutes Intravenous Every 12 hours 03/30/17 1508 03/30/17 2134   03/30/17 0802  cefoTEtan in Dextrose 5% (CEFOTAN) 2-2.08 GM-%(50ML) IVPB    Comments:  Danley Danker   : cabinet override      03/30/17 0802 03/30/17 0845   03/30/17 0614  cefoTEtan in Dextrose 5% (CEFOTAN) IVPB 2 g     2 g Intravenous On call to O.R. 03/30/17 0614 03/30/17 0900      Assessment/Plan: Problem List: Patient Active Problem List   Diagnosis Date Noted  . Diverticular disease 03/30/2017  . Cyst of left ovary   . Diverticulosis of colon (without mention of hemorrhage) 01/16/2017  . Diverticulitis of large intestine without perforation or  abscess without bleeding 01/16/2017  . Ovarian mass, left 11/16/2016  . Uterine leiomyoma 11/16/2016  . Abdominal pain 11/11/2016  . Glaucoma 09/30/2016  . Ventral hernia 04/18/2016  . Persistent vomiting 10/14/2015  . Bilateral leg paresthesia 04/10/2015  . Onychomycosis 01/09/2015  . DVT of lower extremity, bilateral ([place]) 10/16/2013  . Abscess of abdominal cavity ([place]) 10/14/2013  . Intertrigo 09/04/2013  . History of diverticulitis 07/31/2013  . Healthcare maintenance 09/14/2011  . Vitamin D deficiency 09/02/2009  . KNEE PAIN, LEFT 09/02/2009  . Prediabetes 12/31/2008  . Diverticulosis of large intestine 08/27/2008  . IBS 04/02/2008  . HYPERTENSION, BENIGN ESSENTIAL 06/18/2007  . HLD (hyperlipidemia) 06/12/2007  . Morbid obesity with BMI of 50.0-59.9, adult ([place]) 06/11/2007    Diet advanced.  She was tentative about discharge today.  Likely discharge in AM.   3 Days Post-Op    LOS: 3 days   Matt B. Hassell Done, MD, [facility] [phone] beeper [phone]  04/02/2017 9:01 AM

## 2017-04-02 NOTE — Anesthesia Postprocedure Evaluation (Signed)
Anesthesia Post Note  Patient: Leah Peterson  Procedure(s) Performed: XI ROBOT ASSISTED SIGMOIDECTOMY (N/A Abdomen) CYSTOSCOPY WITH BILATERALURETERAL CATHATERS (Bilateral Ureter) XI ROBOTIC ASSISTED BILATERAL SALPINGO OOPHORECTOMY (Bilateral Abdomen)     Patient location during evaluation: PACU Anesthesia Type: General Level of consciousness: awake and alert Pain management: pain level controlled Vital Signs Assessment: post-procedure vital signs reviewed and stable Respiratory status: spontaneous breathing, nonlabored ventilation, respiratory function stable and patient connected to nasal cannula oxygen Cardiovascular status: blood pressure returned to baseline and stable Postop Assessment: no apparent nausea or vomiting Anesthetic complications: no    Last Vitals:  Vitals:   04/02/17 1400 04/02/17 2041  BP: (!) 145/78 122/66  Pulse: 80 72  Resp: 18 18  Temp: 36.9 C 37 C  SpO2: 100% 98%    Last Pain:  Vitals:   04/02/17 2041  TempSrc: Oral  PainSc:                  Riccardo Dubin

## 2017-04-03 LAB — CBC WITH DIFFERENTIAL/PLATELET
BASOS ABS: 0 10*3/uL (ref 0.0–0.1)
Basophils Relative: 0 %
EOS PCT: 2 %
Eosinophils Absolute: 0.2 10*3/uL (ref 0.0–0.7)
HEMATOCRIT: 33 % — AB (ref 36.0–46.0)
Hemoglobin: 10.7 g/dL — ABNORMAL LOW (ref 12.0–15.0)
LYMPHS ABS: 2.2 10*3/uL (ref 0.7–4.0)
LYMPHS PCT: 36 %
MCH: 27.9 pg (ref 26.0–34.0)
MCHC: 32.4 g/dL (ref 30.0–36.0)
MCV: 86.2 fL (ref 78.0–100.0)
MONO ABS: 0.5 10*3/uL (ref 0.1–1.0)
Monocytes Relative: 8 %
NEUTROS ABS: 3.4 10*3/uL (ref 1.7–7.7)
Neutrophils Relative %: 54 %
PLATELETS: 224 10*3/uL (ref 150–400)
RBC: 3.83 MIL/uL — AB (ref 3.87–5.11)
RDW: 13.6 % (ref 11.5–15.5)
WBC: 6.3 10*3/uL (ref 4.0–10.5)

## 2017-04-03 NOTE — Discharge Summary (Signed)
Physician Discharge Summary  Patient ID: Leah Peterson MRN: [id number] DOB/AGE: Sep 09, 1955 61 y.o.  Admit date: 03/30/2017 Discharge date: 04/03/2017  Admission Diagnoses:    Cyst of left ovary   Diverticular disease   Discharge Diagnoses:  Active Problems:   Cyst of left ovary   Diverticular disease   Discharged Condition: good  Hospital Course: patient was admitted after her surgery.  Her diet was advanced as tolerated.  Her Foley was removed by postop day 2.  By postop day 4 she was in stable condition for discharge to home.  Consults: None  Significant Diagnostic Studies: labs: cbc, bmet  Treatments: IV hydration, analgesia: acetaminophen and surgery: robotic sigmoidectomy with splenic flexure mobilization and bilateral oophorectomy and salpingectomy  Discharge Exam: Blood pressure 128/67, pulse 80, temperature 99.2 F (37.3 C), temperature source Oral, resp. rate 18, height 5\' 1"  (1.549 m), weight 115.7 kg (255 lb), SpO2 98 %. General appearance: alert and cooperative GI: normal findings: soft, non-tender Incision/Wound: clean, dry, intact  Disposition: 01-Home or Self Care   Allergies as of 04/03/2017      Reactions   Iodinated Diagnostic Agents Itching   Lisinopril Other (See Comments)   REACTION: lethargy, cough.   Penicillins Other (See Comments)   LYMPH NODES SWELL Has patient had a PCN reaction causing immediate rash, facial/tongue/throat swelling, SOB or lightheadedness with hypotension: No Has patient had a PCN reaction causing severe rash involving mucus membranes or skin necrosis: No Has patient had a PCN reaction that required hospitalization No Has patient had a PCN reaction occurring within the last 10 years: No If all of the above answers are "NO", then may proceed with Cephalosporin use.      Medication List    STOP taking these medications   dicyclomine 10 MG capsule Commonly known as:  BENTYL   metroNIDAZOLE 500 MG tablet Commonly  known as:  FLAGYL   neomycin 500 MG tablet Commonly known as:  MYCIFRADIN     TAKE these medications   amLODipine 5 MG tablet Commonly known as:  NORVASC Take 1 tablet (5 mg total) by mouth daily.   bisacodyl 5 MG EC tablet Commonly known as:  DULCOLAX Take 5 mg by mouth See admin instructions. Take 20 mg by mouth the morning before the procedure   cetirizine 10 MG tablet Commonly known as:  ZYRTEC Take 10 mg by mouth at bedtime as needed for allergies.   hydrochlorothiazide 25 MG tablet Commonly known as:  HYDRODIURIL Take 1 tablet (25 mg total) by mouth daily.   ibuprofen 200 MG tablet Commonly known as:  ADVIL,MOTRIN Take 200 mg by mouth every 6 (six) hours as needed for headache or moderate pain.   latanoprost 0.005 % ophthalmic solution Commonly known as:  XALATAN Place 1 drop into both eyes at bedtime.   multivitamin with minerals Tabs tablet Take 1 tablet by mouth daily.   ondansetron 4 MG tablet Commonly known as:  ZOFRAN Take 1 tablet (4 mg total) by mouth every 8 (eight) hours as needed for nausea or vomiting.   oxyCODONE-acetaminophen 5-325 MG tablet Commonly known as:  PERCOCET/ROXICET Take 1-2 tablets by mouth every 4 (four) hours as needed for severe pain.   polyethylene glycol powder powder Commonly known as:  GLYCOLAX/MIRALAX Take 0.5 Containers by mouth once.   trolamine salicylate 10 % cream Commonly known as:  ASPERCREME Apply 1 application topically as needed (for knee pain).      Follow-up Information    Leighton Ruff, MD.  Schedule an appointment as soon as possible for a visit in 2 week(s).   Specialty:  General Surgery Contact information: [address]  [place] [address] [phone]        Everitt Amber, MD. Schedule an appointment as soon as possible for a visit in 4 week(s).   Specialty:  Obstetrics and Gynecology Contact information: [address] [address] [phone]            Signed: Rosario Adie 01/01/3299, 7:62 AM

## 2017-04-03 NOTE — Discharge Instructions (Signed)
ABDOMINAL SURGERY: POST OP INSTRUCTIONS  1. DIET: Follow a light bland diet the first 24 hours after arrival home, such as soup, liquids, crackers, etc.  Be sure to include lots of fluids daily.  Avoid fast food or heavy meals as your are more likely to get nauseated.  Do not eat any uncooked fruits or vegetables for the next 2 weeks as your colon heals. 2. Take your usually prescribed home medications unless otherwise directed. 3. PAIN CONTROL: a. Pain is best controlled by a usual combination of three different methods TOGETHER: i. Ice/Heat ii. Over the counter pain medication iii. Prescription pain medication b. Most patients will experience some swelling and bruising around the incisions.  Ice packs or heating pads (30-60 minutes up to 6 times a day) will help. Use ice for the first few days to help decrease swelling and bruising, then switch to heat to help relax tight/sore spots and speed recovery.  Some people prefer to use ice alone, heat alone, alternating between ice & heat.  Experiment to what works for you.  Swelling and bruising can take several weeks to resolve.   c. It is helpful to take an over-the-counter pain medication regularly for the first few weeks.  Choose one of the following that works best for you: i. Naproxen (Aleve, etc)  Two 220mg  tabs twice a day ii. Ibuprofen (Advil, etc) Three 200mg  tabs four times a day (every meal & bedtime) iii. Acetaminophen (Tylenol, etc) 500-650mg  four times a day (every meal & bedtime) d. A  prescription for pain medication (such as oxycodone, hydrocodone, etc) should be given to you upon discharge.  Take your pain medication as prescribed.  i. If you are having problems/concerns with the prescription medicine (does not control pain, nausea, vomiting, rash, itching, etc), please call us [phone] to see if we need to switch you to a different pain medicine that will work better for you and/or control your side effect better. ii. If you  need a refill on your pain medication, please contact your pharmacy.  They will contact our office to request authorization. Prescriptions will not be filled after 5 pm or on week-ends. 4. Avoid getting constipated.  Between the surgery and the pain medications, it is common to experience some constipation.  Increasing fluid intake and taking a fiber supplement (such as Metamucil, Citrucel, FiberCon, MiraLax, etc) 1-2 times a day regularly will usually help prevent this problem from occurring.  A mild laxative (prune juice, Milk of Magnesia, MiraLax, etc) should be taken according to package directions if there are no bowel movements after 48 hours.   5. Watch out for diarrhea.  If you have many loose bowel movements, simplify your diet to bland foods & liquids for a few days.  Stop any stool softeners and decrease your fiber supplement.  Switching to mild anti-diarrheal medications (Kayopectate, Pepto Bismol) can help.  If this worsens or does not improve, please call us. 6. Wash / shower every day.  You may shower over the incision / wound.  Avoid baths until the skin is fully healed.  Continue to shower over incision(s) after the dressing is off. 7. Remove your dressing daily and wash with soap and water.  Cover with gauze and change daily 8. ACTIVITIES as tolerated:   a. You may resume regular (light) daily activities beginning the next day--such as daily self-care, walking, climbing stairs--gradually increasing activities as tolerated.  If you can walk 30 minutes without difficulty, it is safe to try  more intense activity such as jogging, treadmill, bicycling, low-impact aerobics, swimming, etc. b. Save the most intensive and strenuous activity for last such as sit-ups, heavy lifting, contact sports, etc  Refrain from any heavy lifting or straining until you are off narcotics for pain control.   c. DO NOT PUSH THROUGH PAIN.  Let pain be your guide: If it hurts to do something, don't do it.  Pain is your  body warning you to avoid that activity for another week until the pain goes down. d. You may drive when you are no longer taking prescription pain medication, you can comfortably wear a seatbelt, and you can safely maneuver your car and apply brakes. e. Dennis Bast may have sexual intercourse when it is comfortable.  9. FOLLOW UP in our office a. Please call [facility] at [phone] to set up an appointment to see your surgeon in the office for a follow-up appointment approximately 1-2 weeks after your surgery. b. Make sure that you call for this appointment the day you arrive home to insure a convenient appointment time. 10. IF YOU HAVE DISABILITY OR FAMILY LEAVE FORMS, BRING THEM TO THE OFFICE FOR PROCESSING.  DO NOT GIVE THEM TO YOUR DOCTOR.   WHEN TO CALL us [phone]: 1. Poor pain control 2. Reactions / problems with new medications (rash/itching, nausea, etc)  3. Fever over 101.5 F (38.5 C) 4. Inability to urinate 5. Nausea and/or vomiting 6. Worsening swelling or bruising 7. Continued bleeding from incision. 8. Increased pain, redness, or drainage from the incision  The clinic staff is available to answer your questions during regular business hours (8:30am-5pm).  Please dont hesitate to call and ask to speak to one of our nurses for clinical concerns.   A surgeon from [facility] is always on call at the hospitals   If you have a medical emergency, go to the nearest emergency room or call 911.    [facility], [place], [place], [place], [place]  [address] ? MAIN: [phone] ? TOLL FREE: [phone] ? FAX [phone] [email or web address]

## 2017-08-25 ENCOUNTER — Telehealth: Payer: Self-pay | Admitting: Family Medicine

## 2017-08-25 ENCOUNTER — Other Ambulatory Visit: Payer: Self-pay | Admitting: Family Medicine

## 2017-08-25 DIAGNOSIS — Z1231 Encounter for screening mammogram for malignant neoplasm of breast: Secondary | ICD-10-CM

## 2017-08-25 NOTE — Telephone Encounter (Signed)
Terri, do you want to wait to closer to appt in 10/2017 to send to Dr G or do you want me to go ahead and send to Dr Darnell Level. Thank you.

## 2017-08-25 NOTE — Telephone Encounter (Signed)
Copied from [place]. Topic: General - Other >> Aug 25, 2017  4:06 PM Boyd Kerbs wrote: Reason for CRM: pt. Has labs and physical set up for 7/17 labs and 7/24 CPE.  Labs will need to be put in.

## 2017-08-29 NOTE — Telephone Encounter (Signed)
We will take care of the orders as the appt gets closer

## 2017-10-25 ENCOUNTER — Ambulatory Visit
Admission: RE | Admit: 2017-10-25 | Discharge: 2017-10-25 | Disposition: A | Discharge status: Home / Self Care | Payer: BC Managed Care – PPO | Source: Ambulatory Visit | Attending: Family Medicine | Admitting: Family Medicine

## 2017-10-25 DIAGNOSIS — Z1231 Encounter for screening mammogram for malignant neoplasm of breast: Secondary | ICD-10-CM

## 2017-10-25 LAB — HM MAMMOGRAPHY

## 2017-10-26 ENCOUNTER — Encounter: Payer: Self-pay | Admitting: Family Medicine

## 2017-11-14 ENCOUNTER — Other Ambulatory Visit: Payer: Self-pay | Admitting: Family Medicine

## 2017-11-14 DIAGNOSIS — E559 Vitamin D deficiency, unspecified: Secondary | ICD-10-CM

## 2017-11-14 DIAGNOSIS — E785 Hyperlipidemia, unspecified: Secondary | ICD-10-CM

## 2017-11-14 DIAGNOSIS — I10 Essential (primary) hypertension: Secondary | ICD-10-CM

## 2017-11-14 DIAGNOSIS — R7303 Prediabetes: Secondary | ICD-10-CM

## 2017-11-14 DIAGNOSIS — D649 Anemia, unspecified: Secondary | ICD-10-CM

## 2017-11-15 ENCOUNTER — Other Ambulatory Visit (INDEPENDENT_AMBULATORY_CARE_PROVIDER_SITE_OTHER): Payer: BC Managed Care – PPO

## 2017-11-15 DIAGNOSIS — E785 Hyperlipidemia, unspecified: Secondary | ICD-10-CM

## 2017-11-15 DIAGNOSIS — E559 Vitamin D deficiency, unspecified: Secondary | ICD-10-CM | POA: Diagnosis not present

## 2017-11-15 DIAGNOSIS — R7303 Prediabetes: Secondary | ICD-10-CM | POA: Diagnosis not present

## 2017-11-15 DIAGNOSIS — D649 Anemia, unspecified: Secondary | ICD-10-CM | POA: Diagnosis not present

## 2017-11-15 LAB — COMPREHENSIVE METABOLIC PANEL
ALBUMIN: 3.9 g/dL (ref 3.5–5.2)
ALK PHOS: 53 U/L (ref 39–117)
ALT: 10 U/L (ref 0–35)
AST: 15 U/L (ref 0–37)
BILIRUBIN TOTAL: 0.5 mg/dL (ref 0.2–1.2)
BUN: 23 mg/dL (ref 6–23)
CO2: 28 mEq/L (ref 19–32)
CREATININE: 1.16 mg/dL (ref 0.40–1.20)
Calcium: 9.2 mg/dL (ref 8.4–10.5)
Chloride: 105 mEq/L (ref 96–112)
GFR: 61.34 mL/min (ref >60.00)
Glucose, Bld: 102 mg/dL — ABNORMAL HIGH (ref 70–99)
POTASSIUM: 3.9 meq/L (ref 3.5–5.1)
Sodium: 140 mEq/L (ref 135–145)
TOTAL PROTEIN: 7.6 g/dL (ref 6.0–8.3)

## 2017-11-15 LAB — LIPID PANEL
CHOLESTEROL: 176 mg/dL (ref 0–200)
HDL: 54.5 mg/dL (ref >39.00)
LDL Cholesterol: 111 mg/dL — ABNORMAL HIGH (ref 0–99)
NonHDL: 121.68
Total CHOL/HDL Ratio: 3
Triglycerides: 55 mg/dL (ref 0.0–149.0)
VLDL: 11 mg/dL (ref 0.0–40.0)

## 2017-11-15 LAB — CBC WITH DIFFERENTIAL/PLATELET
Basophils Absolute: 0 10*3/uL (ref 0.0–0.1)
Basophils Relative: 0.5 % (ref 0.0–3.0)
Eosinophils Absolute: 0 10*3/uL (ref 0.0–0.7)
Eosinophils Relative: 1.1 % (ref 0.0–5.0)
HEMATOCRIT: 36.2 % (ref 36.0–46.0)
Hemoglobin: 11.8 g/dL — ABNORMAL LOW (ref 12.0–15.0)
LYMPHS PCT: 49.9 % — AB (ref 12.0–46.0)
Lymphs Abs: 2.2 10*3/uL (ref 0.7–4.0)
MCHC: 32.6 g/dL (ref 30.0–36.0)
MCV: 81.6 fl (ref 78.0–100.0)
MONOS PCT: 8.4 % (ref 3.0–12.0)
Monocytes Absolute: 0.4 10*3/uL (ref 0.1–1.0)
NEUTROS ABS: 1.7 10*3/uL (ref 1.4–7.7)
Neutrophils Relative %: 40.1 % — ABNORMAL LOW (ref 43.0–77.0)
PLATELETS: 204 10*3/uL (ref 150.0–400.0)
RBC: 4.43 Mil/uL (ref 3.87–5.11)
RDW: 14.5 % (ref 11.5–15.5)
WBC: 4.3 10*3/uL (ref 4.0–10.5)

## 2017-11-15 LAB — VITAMIN D 25 HYDROXY (VIT D DEFICIENCY, FRACTURES): VITD: 19.24 ng/mL — ABNORMAL LOW (ref 30.00–100.00)

## 2017-11-15 LAB — HEMOGLOBIN A1C: Hgb A1c MFr Bld: 6.2 % (ref 4.6–6.5)

## 2017-11-17 ENCOUNTER — Encounter: Payer: Self-pay | Admitting: Family Medicine

## 2017-11-17 ENCOUNTER — Ambulatory Visit (INDEPENDENT_AMBULATORY_CARE_PROVIDER_SITE_OTHER): Payer: BC Managed Care – PPO | Admitting: Family Medicine

## 2017-11-17 VITALS — BP 134/80 | HR 61 | Temp 97.8°F | Ht 60.75 in | Wt 290.5 lb

## 2017-11-17 DIAGNOSIS — I10 Essential (primary) hypertension: Secondary | ICD-10-CM

## 2017-11-17 DIAGNOSIS — Z0000 Encounter for general adult medical examination without abnormal findings: Secondary | ICD-10-CM

## 2017-11-17 DIAGNOSIS — E785 Hyperlipidemia, unspecified: Secondary | ICD-10-CM

## 2017-11-17 DIAGNOSIS — E559 Vitamin D deficiency, unspecified: Secondary | ICD-10-CM

## 2017-11-17 DIAGNOSIS — M25561 Pain in right knee: Secondary | ICD-10-CM

## 2017-11-17 DIAGNOSIS — M25562 Pain in left knee: Secondary | ICD-10-CM

## 2017-11-17 DIAGNOSIS — G8929 Other chronic pain: Secondary | ICD-10-CM

## 2017-11-17 DIAGNOSIS — R7303 Prediabetes: Secondary | ICD-10-CM

## 2017-11-17 DIAGNOSIS — Z6841 Body Mass Index (BMI) 40.0 and over, adult: Secondary | ICD-10-CM

## 2017-11-17 DIAGNOSIS — N83202 Unspecified ovarian cyst, left side: Secondary | ICD-10-CM

## 2017-11-17 MED ORDER — AMLODIPINE BESYLATE 5 MG PO TABS
5.0000 mg | ORAL_TABLET | Freq: Every day | ORAL | 3 refills | Status: DC
Start: 2017-11-17 — End: 2019-01-30

## 2017-11-17 MED ORDER — HYDROCHLOROTHIAZIDE 25 MG PO TABS: 25.0000 mg | ORAL_TABLET | Freq: Every day | ORAL | 3 refills | Status: DC

## 2017-11-17 MED ORDER — VITAMIN D 50 MCG (2000 UT) PO CAPS: 1.0000 | ORAL_CAPSULE | Freq: Every day | ORAL | Status: AC

## 2017-11-17 NOTE — Assessment & Plan Note (Signed)
Actually was inclusion cyst of left fallopian tube, this was removed.

## 2017-11-17 NOTE — Assessment & Plan Note (Signed)
Preventative protocols reviewed and updated unless pt declined. Discussed healthy diet and lifestyle.  

## 2017-11-17 NOTE — Progress Notes (Signed)
BP 134/80 (BP Location: Left Arm, Patient Position: Sitting, Cuff Size: Normal) Comment (Cuff Size): Used on lower arm  Pulse 61   Temp 97.8 F (36.6 C) (Oral)   Ht 5' 0.75" (1.543 m)   Wt 290 lb 8 oz (131.8 kg)   SpO2 98%   BMI 55.34 kg/m    CC: CPE Subjective:    Patient ID: Leah Peterson, female    DOB: 05-24-1955, 62 y.o.   MRN: [id number]  HPI: Leah Peterson is a 62 y.o. 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 She subsequently underwent robot assisted sigmoidectomy and BSO 03/2017. Recovered well from this.   Notes worsening knee pain L>R. L knee grinding. R knee has limited flexion. Has been told has bone spurs and osteoarthritis.   Preventative: COLONOSCOPY WITH PROPOFOL Laterality: N/A Date: 06/30/2015 diverticulosis, rpt 10 yrs Gatha Mayer, MD) Mammogram 09/2016 WNL Pap smear - always normal, last done 2016. Requests postponed to next year.  LMP 10/2007. No post menopausal sxs.  Flu shot yearly Tetanus 2010.  Pneumovax 07/2013 Seat belt use discussed Sunscreen use discussed. No suspicious moles on skin. Ex smoker - quit remotely Alcohol - seldom Dentist - Q4 mo Eye exam - yearly  Caffeine: hot tea in the morning Lives with daughter and grand daughter. No pets.  Divorced; 1 daughter Occupation: Administrator at [employer]  Activity: walks lap at work.  Diet: good water, fruits/vegetables daily   Relevant past medical, surgical, family and social history reviewed and updated as indicated. Interim medical history since our last visit reviewed. Allergies and medications  reviewed and updated. Outpatient Medications Prior to Visit  Medication Sig Dispense Refill  . cetirizine (ZYRTEC) 10 MG tablet Take 10 mg by mouth at bedtime as needed for allergies.     Marland Kitchen latanoprost (XALATAN) 0.005 % ophthalmic solution Place 1 drop into both eyes at bedtime.   0  . Multiple Vitamin (MULTIVITAMIN WITH MINERALS) TABS tablet Take 1 tablet by mouth daily.    Marland Kitchen trolamine salicylate (ASPERCREME) 10 % cream Apply 1 application topically as needed (for knee pain).     Marland Kitchen amLODipine (NORVASC) 5 MG tablet Take 1 tablet (5 mg total) by mouth daily. 90 tablet 3  . hydrochlorothiazide (HYDRODIURIL) 25 MG tablet Take 1 tablet (25 mg total) by mouth daily. 90 tablet 3  . bisacodyl (DULCOLAX) 5 MG EC tablet Take 5 mg by mouth See admin instructions. Take 20 mg by mouth the morning before the procedure    . ibuprofen (ADVIL,MOTRIN) 200 MG tablet Take 200 mg by mouth every 6 (six) hours as needed for headache or moderate pain.     Marland Kitchen ondansetron (ZOFRAN) 4 MG tablet Take 1 tablet (4 mg total) by mouth every 8 (eight) hours as needed for nausea or vomiting. 30 tablet 1  . oxyCODONE-acetaminophen (PERCOCET/ROXICET) 5-325 MG tablet Take 1-2 tablets by mouth every 4 (four) hours as needed for severe pain. (Patient not taking: Reported on 03/21/2017) 20 tablet 0  . polyethylene glycol powder (GLYCOLAX/MIRALAX) powder Take 0.5 Containers by mouth once.     No facility-administered medications prior to visit.      Per  HPI unless specifically indicated in ROS section below Review of Systems  Constitutional: Negative for activity change, appetite change, chills, fatigue, fever and unexpected weight change.  HENT: Negative for hearing loss.   Eyes: Negative for visual disturbance.  Respiratory: Negative for cough, chest tightness, shortness of breath and wheezing.   Cardiovascular: Positive for leg swelling. Negative for chest pain and palpitations.  Gastrointestinal: Negative for abdominal  distention, abdominal pain, blood in stool, constipation, diarrhea, nausea and vomiting.  Genitourinary: Negative for difficulty urinating and hematuria.  Musculoskeletal: Negative for arthralgias, myalgias and neck pain.  Skin: Negative for rash.  Neurological: Negative for dizziness, seizures, syncope and headaches.  Hematological: Negative for adenopathy. Does not bruise/bleed easily.  Psychiatric/Behavioral: Negative for dysphoric mood. The patient is not nervous/anxious.        Objective:    BP 134/80 (BP Location: Left Arm, Patient Position: Sitting, Cuff Size: Normal) Comment (Cuff Size): Used on lower arm  Pulse 61   Temp 97.8 F (36.6 C) (Oral)   Ht 5' 0.75" (1.543 m)   Wt 290 lb 8 oz (131.8 kg)   SpO2 98%   BMI 55.34 kg/m   Wt Readings from Last 3 Encounters:  11/17/17 290 lb 8 oz (131.8 kg)  03/30/17 255 lb (115.7 kg)  03/29/17 255 lb 8 oz (115.9 kg)    Physical Exam  Constitutional: She is oriented to person, place, and time. She appears well-developed and well-nourished. No distress.  HENT:  Head: Normocephalic and atraumatic.  Right Ear: Hearing, tympanic membrane, external ear and ear canal normal.  Left Ear: Hearing, tympanic membrane, external ear and ear canal normal.  Nose: Nose normal.  Mouth/Throat: Uvula is midline, oropharynx is clear and moist and mucous membranes are normal. No oropharyngeal exudate, posterior oropharyngeal edema or posterior oropharyngeal erythema.  Eyes: Pupils are equal, round, and reactive to light. Conjunctivae and EOM are normal. No scleral icterus.  Neck: Normal range of motion. Neck supple. No thyromegaly present.  Cardiovascular: Normal rate, regular rhythm, normal heart sounds and intact distal pulses.  No murmur heard. Pulses:      Radial pulses are 2+ on the right side, and 2+ on the left side.  Pulmonary/Chest: Effort normal and breath sounds normal. No respiratory distress. She has no wheezes. She has no rales.    Abdominal: Soft. Bowel sounds are normal. She exhibits no distension and no mass. There is no tenderness. There is no rebound and no guarding.  Musculoskeletal: Normal range of motion. She exhibits no edema.  Lymphadenopathy:    She has no cervical adenopathy.  Neurological: She is alert and oriented to person, place, and time.  CN grossly intact, station and gait intact  Skin: Skin is warm and dry. No rash noted.  Psychiatric: She has a normal mood and affect. Her behavior is normal. Judgment and thought content normal.  Nursing note and vitals reviewed.  Results for orders placed or performed in visit on 11/15/17  CBC with Differential/Platelet  Result Value Ref Range   WBC 4.3 4.0 - 10.5 K/uL   RBC 4.43 3.87 - 5.11 Mil/uL   Hemoglobin 11.8 (L) 12.0 - 15.0 g/dL   HCT 36.2 36.0 - 46.0 %   MCV 81.6 78.0 - 100.0 fl   MCHC 32.6 30.0 - 36.0 g/dL   RDW 14.5 11.5 - 15.5 %   Platelets 204.0 150.0 - 400.0 K/uL   Neutrophils Relative % 40.1 (L) 43.0 - 77.0 %   Lymphocytes Relative 49.9 (H) 12.0 -  46.0 %   Monocytes Relative 8.4 3.0 - 12.0 %   Eosinophils Relative 1.1 0.0 - 5.0 %   Basophils Relative 0.5 0.0 - 3.0 %   Neutro Abs 1.7 1.4 - 7.7 K/uL   Lymphs Abs 2.2 0.7 - 4.0 K/uL   Monocytes Absolute 0.4 0.1 - 1.0 K/uL   Eosinophils Absolute 0.0 0.0 - 0.7 K/uL   Basophils Absolute 0.0 0.0 - 0.1 K/uL  VITAMIN D 25 Hydroxy (Vit-D Deficiency, Fractures)  Result Value Ref Range   VITD 19.24 (L) 30.00 - 100.00 ng/mL  Hemoglobin A1c  Result Value Ref Range   Hgb A1c MFr Bld 6.2 4.6 - 6.5 %  Comprehensive metabolic panel  Result Value Ref Range   Sodium 140 135 - 145 mEq/L   Potassium 3.9 3.5 - 5.1 mEq/L   Chloride 105 96 - 112 mEq/L   CO2 28 19 - 32 mEq/L   Glucose, Bld 102 (H) 70 - 99 mg/dL   BUN 23 6 - 23 mg/dL   Creatinine, Ser 1.16 0.40 - 1.20 mg/dL   Total Bilirubin 0.5 0.2 - 1.2 mg/dL   Alkaline Phosphatase 53 39 - 117 U/L   AST 15 0 - 37 U/L   ALT 10 0 - 35 U/L   Total  Protein 7.6 6.0 - 8.3 g/dL   Albumin 3.9 3.5 - 5.2 g/dL   Calcium 9.2 8.4 - 10.5 mg/dL   GFR 61.34 >60.00 mL/min  Lipid panel  Result Value Ref Range   Cholesterol 176 0 - 200 mg/dL   Triglycerides 55.0 0.0 - 149.0 mg/dL   HDL 54.50 >39.00 mg/dL   VLDL 11.0 0.0 - 40.0 mg/dL   LDL Cholesterol 111 (H) 0 - 99 mg/dL   Total CHOL/HDL Ratio 3    NonHDL 121.68       Assessment & Plan:   Problem List Items Addressed This Visit    Vitamin D deficiency    Discussed deficiency . Not taking regularly - advised start 2000 IU daily.       Prediabetes    Reviewed with patient. Advised limiting added sugars in diet.       Morbid obesity with BMI of 50.0-59.9, adult (HCC)    Chronic, weight gain noted now that GI symptoms have resolved. Encouraged regular exercises (limited by knees) and healthy diet choices for goal sustainable weight loss.       HYPERTENSION, BENIGN ESSENTIAL    Chronic, stable. Continue current regimen.       Relevant Medications   hydrochlorothiazide (HYDRODIURIL) 25 MG tablet   amLODipine (NORVASC) 5 MG tablet   HLD (hyperlipidemia)    Chronic, not on statin.  The 10-year ASCVD risk score Mikey Bussing DC Brooke Bonito., et al., 2013) is: 15%   Values used to calculate the score:     Age: 54 years     Sex: Female     Is Non-Hispanic African American: Yes     Diabetic: Yes     Tobacco smoker: No     Systolic Blood Pressure: 109 mmHg     Is BP treated: Yes     HDL Cholesterol: 54.5 mg/dL     Total Cholesterol: 176 mg/dL       Relevant Medications   hydrochlorothiazide (HYDRODIURIL) 25 MG tablet   amLODipine (NORVASC) 5 MG tablet   Healthcare maintenance - Primary    Preventative protocols reviewed and updated unless pt declined. Discussed healthy diet and lifestyle.       Cyst of left  ovary    Actually was inclusion cyst of left fallopian tube, this was removed.       Bilateral chronic knee pain    Anticipate DJD osteoarthritis, encouraged working on weight loss for  joint health. She is planning to see sports medicine for further evaluation          Meds ordered this encounter  Medications  . hydrochlorothiazide (HYDRODIURIL) 25 MG tablet    Sig: Take 1 tablet (25 mg total) by mouth daily.    Dispense:  90 tablet    Refill:  3  . amLODipine (NORVASC) 5 MG tablet    Sig: Take 1 tablet (5 mg total) by mouth daily.    Dispense:  90 tablet    Refill:  3  . Cholecalciferol (VITAMIN D) 2000 units CAPS    Sig: Take 1 capsule (2,000 Units total) by mouth daily.    Dispense:  30 capsule   No orders of the defined types were placed in this encounter.   Follow up plan: Return in about 1 year (around 11/18/2018) for annual exam, prior fasting for blood work.  Ria Bush, MD

## 2017-11-17 NOTE — Assessment & Plan Note (Signed)
Chronic, stable. Continue current regimen. 

## 2017-11-17 NOTE — Patient Instructions (Addendum)
Schedule follow up with Dr Lorelei Pont. You are doing well today Start vitamin D 2000 units daily Watch added sugars in the diet to help control prediabetes. Work on regular walking - stop and rest if knees start hurting.   Health Maintenance, Female Adopting a healthy lifestyle and getting preventive care can go a long way to promote health and wellness. Talk with your health care provider about what schedule of regular examinations is right for you. This is a good chance for you to check in with your provider about disease prevention and staying healthy. In between checkups, there are plenty of things you can do on your own. Experts have done a lot of research about which lifestyle changes and preventive measures are most likely to keep you healthy. Ask your health care provider for more information. Weight and diet Eat a healthy diet  Be sure to include plenty of vegetables, fruits, low-fat dairy products, and lean protein.  Do not eat a lot of foods high in solid fats, added sugars, or salt.  Get regular exercise. This is one of the most important things you can do for your health. ? Most adults should exercise for at least 150 minutes each week. The exercise should increase your heart rate and make you sweat (moderate-intensity exercise). ? Most adults should also do strengthening exercises at least twice a week. This is in addition to the moderate-intensity exercise.  Maintain a healthy weight  Body mass index (BMI) is a measurement that can be used to identify possible weight problems. It estimates body fat based on height and weight. Your health care provider can help determine your BMI and help you achieve or maintain a healthy weight.  For females 71 years of age and older: ? A BMI below 18.5 is considered underweight. ? A BMI of 18.5 to 24.9 is normal. ? A BMI of 25 to 29.9 is considered overweight. ? A BMI of 30 and above is considered obese.  Watch levels of cholesterol and  blood lipids  You should start having your blood tested for lipids and cholesterol at 60 years of age, then have this test every 5 years.  You may need to have your cholesterol levels checked more often if: ? Your lipid or cholesterol levels are high. ? You are older than 60 years of age. ? You are at high risk for heart disease.  Cancer screening Lung Cancer  Lung cancer screening is recommended for adults 53-75 years old who are at high risk for lung cancer because of a history of smoking.  A yearly low-dose CT scan of the lungs is recommended for people who: ? Currently smoke. ? Have quit within the past 15 years. ? Have at least a 30-pack-year history of smoking. A pack year is smoking an average of one pack of cigarettes a day for 1 year.  Yearly screening should continue until it has been 15 years since you quit.  Yearly screening should stop if you develop a health problem that would prevent you from having lung cancer treatment.  Breast Cancer  Practice breast self-awareness. This means understanding how your breasts normally appear and feel.  It also means doing regular breast self-exams. Let your health care provider know about any changes, no matter how small.  If you are in your 20s or 30s, you should have a clinical breast exam (CBE) by a health care provider every 1-3 years as part of a regular health exam.  If you are 40 or  older, have a CBE every year. Also consider having a breast X-ray (mammogram) every year.  If you have a family history of breast cancer, talk to your health care provider about genetic screening.  If you are at high risk for breast cancer, talk to your health care provider about having an MRI and a mammogram every year.  Breast cancer gene (BRCA) assessment is recommended for women who have family members with BRCA-related cancers. BRCA-related cancers include: ? Breast. ? Ovarian. ? Tubal. ? Peritoneal cancers.  Results of the assessment  will determine the need for genetic counseling and BRCA1 and BRCA2 testing.  Cervical Cancer Your health care provider may recommend that you be screened regularly for cancer of the pelvic organs (ovaries, uterus, and vagina). This screening involves a pelvic examination, including checking for microscopic changes to the surface of your cervix (Pap test). You may be encouraged to have this screening done every 3 years, beginning at age 21.  For women ages 52-65, health care providers may recommend pelvic exams and Pap testing every 3 years, or they may recommend the Pap and pelvic exam, combined with testing for human papilloma virus (HPV), every 5 years. Some types of HPV increase your risk of cervical cancer. Testing for HPV may also be done on women of any age with unclear Pap test results.  Other health care providers may not recommend any screening for nonpregnant women who are considered low risk for pelvic cancer and who do not have symptoms. Ask your health care provider if a screening pelvic exam is right for you.  If you have had past treatment for cervical cancer or a condition that could lead to cancer, you need Pap tests and screening for cancer for at least 20 years after your treatment. If Pap tests have been discontinued, your risk factors (such as having a new sexual partner) need to be reassessed to determine if screening should resume. Some women have medical problems that increase the chance of getting cervical cancer. In these cases, your health care provider may recommend more frequent screening and Pap tests.  Colorectal Cancer  This type of cancer can be detected and often prevented.  Routine colorectal cancer screening usually begins at 60 years of age and continues through 60 years of age.  Your health care provider may recommend screening at an earlier age if you have risk factors for colon cancer.  Your health care provider may also recommend using home test kits to  check for hidden blood in the stool.  A small camera at the end of a tube can be used to examine your colon directly (sigmoidoscopy or colonoscopy). This is done to check for the earliest forms of colorectal cancer.  Routine screening usually begins at age 49.  Direct examination of the colon should be repeated every 5-10 years through 60 years of age. However, you may need to be screened more often if early forms of precancerous polyps or small growths are found.  Skin Cancer  Check your skin from head to toe regularly.  Tell your health care provider about any new moles or changes in moles, especially if there is a change in a mole's shape or color.  Also tell your health care provider if you have a mole that is larger than the size of a pencil eraser.  Always use sunscreen. Apply sunscreen liberally and repeatedly throughout the day.  Protect yourself by wearing long sleeves, pants, a wide-brimmed hat, and sunglasses whenever you are outside.  Heart disease, diabetes, and high blood pressure  High blood pressure causes heart disease and increases the risk of stroke. High blood pressure is more likely to develop in: ? People who have blood pressure in the high end of the normal range (130-139/85-89 mm Hg). ? People who are overweight or obese. ? People who are African American.  If you are 4-70 years of age, have your blood pressure checked every 3-5 years. If you are 38 years of age or older, have your blood pressure checked every year. You should have your blood pressure measured twice-once when you are at a hospital or clinic, and once when you are not at a hospital or clinic. Record the average of the two measurements. To check your blood pressure when you are not at a hospital or clinic, you can use: ? An automated blood pressure machine at a pharmacy. ? A home blood pressure monitor.  If you are between 34 years and 35 years old, ask your health care provider if you should  take aspirin to prevent strokes.  Have regular diabetes screenings. This involves taking a blood sample to check your fasting blood sugar level. ? If you are at a normal weight and have a low risk for diabetes, have this test once every three years after 60 years of age. ? If you are overweight and have a high risk for diabetes, consider being tested at a younger age or more often. Preventing infection Hepatitis B  If you have a higher risk for hepatitis B, you should be screened for this virus. You are considered at high risk for hepatitis B if: ? You were born in a country where hepatitis B is common. Ask your health care provider which countries are considered high risk. ? Your parents were born in a high-risk country, and you have not been immunized against hepatitis B (hepatitis B vaccine). ? You have HIV or AIDS. ? You use needles to inject street drugs. ? You live with someone who has hepatitis B. ? You have had sex with someone who has hepatitis B. ? You get hemodialysis treatment. ? You take certain medicines for conditions, including cancer, organ transplantation, and autoimmune conditions.  Hepatitis C  Blood testing is recommended for: ? Everyone born from [birth year] through 1963. ? Anyone with known risk factors for hepatitis C.  Sexually transmitted infections (STIs)  You should be screened for sexually transmitted infections (STIs) including gonorrhea and chlamydia if: ? You are sexually active and are younger than 60 years of age. ? You are older than 60 years of age and your health care provider tells you that you are at risk for this type of infection. ? Your sexual activity has changed since you were last screened and you are at an increased risk for chlamydia or gonorrhea. Ask your health care provider if you are at risk.  If you do not have HIV, but are at risk, it may be recommended that you take a prescription medicine daily to prevent HIV infection. This is called  pre-exposure prophylaxis (PrEP). You are considered at risk if: ? You are sexually active and do not regularly use condoms or know the HIV status of your partner(s). ? You take drugs by injection. ? You are sexually active with a partner who has HIV.  Talk with your health care provider about whether you are at high risk of being infected with HIV. If you choose to begin PrEP, you should first be tested for HIV.  You should then be tested every 3 months for as long as you are taking PrEP. Pregnancy  If you are premenopausal and you may become pregnant, ask your health care provider about preconception counseling.  If you may become pregnant, take 400 to 800 micrograms (mcg) of folic acid every day.  If you want to prevent pregnancy, talk to your health care provider about birth control (contraception). Osteoporosis and menopause  Osteoporosis is a disease in which the bones lose minerals and strength with aging. This can result in serious bone fractures. Your risk for osteoporosis can be identified using a bone density scan.  If you are 65 years of age or older, or if you are at risk for osteoporosis and fractures, ask your health care provider if you should be screened.  Ask your health care provider whether you should take a calcium or vitamin D supplement to lower your risk for osteoporosis.  Menopause may have certain physical symptoms and risks.  Hormone replacement therapy may reduce some of these symptoms and risks. Talk to your health care provider about whether hormone replacement therapy is right for you. Follow these instructions at home:  Schedule regular health, dental, and eye exams.  Stay current with your immunizations.  Do not use any tobacco products including cigarettes, chewing tobacco, or electronic cigarettes.  If you are pregnant, do not drink alcohol.  If you are breastfeeding, limit how much and how often you drink alcohol.  Limit alcohol intake to no more  than 1 drink per day for nonpregnant women. One drink equals 12 ounces of beer, 5 ounces of wine, or 1 ounces of hard liquor.  Do not use street drugs.  Do not share needles.  Ask your health care provider for help if you need support or information about quitting drugs.  Tell your health care provider if you often feel depressed.  Tell your health care provider if you have ever been abused or do not feel safe at home. This information is not intended to replace advice given to you by your health care provider. Make sure you discuss any questions you have with your health care provider. Document Released: 11/01/2010 Document Revised: 09/24/2015 Document Reviewed: 01/20/2015 [employer] Interactive Patient Education  2018 [employer]  

## 2017-11-17 NOTE — Assessment & Plan Note (Addendum)
Chronic, weight gain noted now that GI symptoms have resolved. Encouraged regular exercises (limited by knees) and healthy diet choices for goal sustainable weight loss.

## 2017-11-17 NOTE — Assessment & Plan Note (Signed)
Chronic, not on statin.  The 10-year ASCVD risk score Mikey Bussing DC Brooke Bonito., et al., 2013) is: 15%   Values used to calculate the score:     Age: 62 years     Sex: Female     Is Non-Hispanic African American: Yes     Diabetic: Yes     Tobacco smoker: No     Systolic Blood Pressure: 841 mmHg     Is BP treated: Yes     HDL Cholesterol: 54.5 mg/dL     Total Cholesterol: 176 mg/dL

## 2017-11-17 NOTE — Assessment & Plan Note (Addendum)
Anticipate DJD osteoarthritis, encouraged working on weight loss for joint health. She is planning to see sports medicine for further evaluation

## 2017-11-17 NOTE — Assessment & Plan Note (Signed)
Discussed deficiency . Not taking regularly - advised start 2000 IU daily.

## 2017-11-17 NOTE — Assessment & Plan Note (Signed)
Reviewed with patient. Advised limiting added sugars in diet.

## 2017-11-22 ENCOUNTER — Encounter: Payer: BC Managed Care – PPO | Admitting: Family Medicine

## 2017-11-29 ENCOUNTER — Encounter: Payer: Self-pay | Admitting: Family Medicine

## 2017-11-29 ENCOUNTER — Ambulatory Visit (INDEPENDENT_AMBULATORY_CARE_PROVIDER_SITE_OTHER)
Admission: RE | Admit: 2017-11-29 | Discharge: 2017-11-29 | Disposition: A | Discharge status: Home / Self Care | Payer: BC Managed Care – PPO | Source: Ambulatory Visit | Attending: Family Medicine | Admitting: Family Medicine

## 2017-11-29 ENCOUNTER — Ambulatory Visit: Payer: BC Managed Care – PPO | Admitting: Family Medicine

## 2017-11-29 VITALS — BP 118/70 | HR 86 | Temp 98.4°F | Ht 60.75 in | Wt 298.8 lb

## 2017-11-29 DIAGNOSIS — G8929 Other chronic pain: Secondary | ICD-10-CM

## 2017-11-29 DIAGNOSIS — M25662 Stiffness of left knee, not elsewhere classified: Secondary | ICD-10-CM

## 2017-11-29 DIAGNOSIS — M25562 Pain in left knee: Secondary | ICD-10-CM

## 2017-11-29 DIAGNOSIS — M170 Bilateral primary osteoarthritis of knee: Secondary | ICD-10-CM

## 2017-11-29 DIAGNOSIS — M25661 Stiffness of right knee, not elsewhere classified: Secondary | ICD-10-CM | POA: Diagnosis not present

## 2017-11-29 DIAGNOSIS — M25561 Pain in right knee: Secondary | ICD-10-CM

## 2017-11-29 DIAGNOSIS — Z6841 Body Mass Index (BMI) 40.0 and over, adult: Secondary | ICD-10-CM

## 2017-11-29 NOTE — Progress Notes (Signed)
Dr. Frederico Hamman T. Pio Eatherly, MD, [facility] [place], [address] Phone: [phone] Fax: [phone]  11/29/2017  Patient: Leah Peterson, MRN: [id number], DOB: Jun 05, 1955, 62 y.o.  Primary Physician:  Ria Bush, MD   Chief Complaint  Patient presents with  . Knee Pain    Bilateral   Subjective:   Leah Peterson is a 62 y.o. very pleasant female patient who presents with the following:  Longstanding B Knee OA: Drives to [employer]. Works for [employer] in Engineer, technical sales.  The patient has been having a lot of problems with her knees for well more than 10 years.  She has a great deal of difficulty even with basic activities and has a lot of problems getting in and out of the car.  She is limited in her ability to go up and down stairs, and she generally uses her right leg to go up stairs.  She is also had some symptomatic giving way on her left leg a number of times.  She is never had any trauma or prior surgery to either knee.  Body mass index is 56.91 kg/m.  She is minimally able to exercise given her relatively traumatic loss of motion in both of her knees.  She is never been to a bariatric center.  Walks OK, uses R leg to go up stairs. L leg has given out on her.   Past Medical History, Surgical History, Social History, Family History, Problem List, Medications, and Allergies have been reviewed and updated if relevant.  Patient Active Problem List   Diagnosis Date Noted  . Diverticular disease 03/30/2017  . Cyst of left ovary   . Uterine leiomyoma 11/16/2016  . Glaucoma 09/30/2016  . Ventral hernia 04/18/2016  . Bilateral leg paresthesia 04/10/2015  . Onychomycosis 01/09/2015  . DVT of lower extremity, bilateral ([place]) 10/16/2013  . Intertrigo 09/04/2013  . History of diverticulitis 07/31/2013  . Healthcare maintenance 09/14/2011  . Vitamin D deficiency 09/02/2009  . Bilateral chronic knee pain 09/02/2009  . Prediabetes  12/31/2008  . IBS 04/02/2008  . HYPERTENSION, BENIGN ESSENTIAL 06/18/2007  . HLD (hyperlipidemia) 06/12/2007  . Morbid obesity with BMI of 50.0-59.9, adult ([place]) 06/11/2007    Past Medical History:  Diagnosis Date  . Abscess of abdominal cavity ([place]) 10/14/2013  . Arthritis    KNEES  . Diverticulitis of large intestine without perforation or abscess without bleeding 01/16/2017  . Diverticulosis of colon (without mention of hemorrhage)   . DVT (deep venous thrombosis) ([place]) 2015   bilateral posterior tibial veins treated with IVC filter  . Essential hypertension, benign    Blood pressure tends to elevate with MD visits.  . Gallstones   . Glaucoma 09/2016   left sided mainly  . History of diverticulitis   . IBS (irritable bowel syndrome)   . Left knee pain 2012   eval by ortho - bone spurs and DJD, treated with steroid shot which helped temporarily  . Liver abscess 2015   hospitalization for severe septic shock after diverticulitis  . Obesity, unspecified   . Other abnormal glucose   . Ovarian mass   . Pure hypercholesterolemia   . Transfusion history    '15-"diverticulosis"  . Unspecified vitamin D deficiency    resolved    Past Surgical History:  Procedure Laterality Date  . CESAREAN SECTION     due to BP elevation AND BABY HEART RATE STOPPING  . COLONOSCOPY  2000  Diverticuli-Dr. Sharlett Iles  . COLONOSCOPY WITH PROPOFOL N/A 06/30/2015   diverticulosis, rpt 10 yrs Gatha Mayer, MD)  . CYSTOSCOPY WITH STENT PLACEMENT Bilateral 03/30/2017   Procedure: CYSTOSCOPY WITH Ezzard Standing CATHATERS;  Surgeon: Irine Seal, MD;  Location: [facility];  Service: Urology;  Laterality: Bilateral;  . DRAINAGE ABD/PERITON ABS PERC ([facility])  2015   liver abscess  . ESOPHAGOGASTRODUODENOSCOPY (EGD) WITH PROPOFOL N/A 12/30/2015   WNL Gatha Mayer, MD)  . IVC FILTER PLACED AND REMOVED   2015  . ROBOTIC ASSISTED BILATERAL SALPINGO OOPHERECTOMY Left 12/27/2016   Attempted XI ROBOTIC  ASSISTED LYSIS OF ADHESIONS AND LEFT SALPINGECTOMY;  Everitt Amber, MD  . ROBOTIC ASSISTED BILATERAL SALPINGO OOPHERECTOMY Bilateral 03/30/2017   L ovarian cyst, h/o diverticular absces - Procedure: XI ROBOTIC ASSISTED BILATERAL SALPINGO OOPHORECTOMY;  Surgeon: Everitt Amber, MD    Social History   Socioeconomic History  . Marital status: Divorced    Spouse name: Not on file  . Number of children: 1  . Years of education: Not on file  . Highest education level: Not on file  Occupational History  . Occupation: Administrator; [employer]  Social Needs  . Financial resource strain: Not on file  . Food insecurity:    Worry: Not on file    Inability: Not on file  . Transportation needs:    Medical: Not on file    Non-medical: Not on file  Tobacco Use  . Smoking status: Former Smoker    Years: 3.00    Last attempt to quit: 06/15/1977    Years since quitting: 40.4  . Smokeless tobacco: Never Used  Substance and Sexual Activity  . Alcohol use: Yes    Comment: wine on occasion   . Drug use: No  . Sexual activity: Never    Birth control/protection: Post-menopausal    Comment: By choice  Lifestyle  . Physical activity:    Days per week: Not on file    Minutes per session: Not on file  . Stress: Not on file  Relationships  . Social connections:    Talks on phone: Not on file    Gets together: Not on file    Attends religious service: Not on file    Active member of club or organization: Not on file    Attends meetings of clubs or organizations: Not on file    Relationship status: Not on file  . Intimate partner violence:    Fear of current or ex partner: Not on file    Emotionally abused: Not on file    Physically abused: Not on file    Forced sexual activity: Not on file  Other Topics Concern  . Not on file  Social History Narrative   Caffeine: 2-3 cups coffee, 2 cups sweet tea   Lives with daughter and grand daughter.  No pets.   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   Activity: granddaughter.  No regular exercise.   Diet: fruits/vegetables daily, drinks a lot of sweet tea    Family History  Problem Relation Age of Onset  . Hypertension Father   . Cancer Father        prostate  . Heart disease Father        CABG x 2  . Cancer Mother        Lung-quit smoking 20 years prior to Dx  . Hypertension Mother   . Drug abuse  Brother   . Diabetes Unknown        + family hx  . Stroke Maternal Grandmother   . Sleep apnea Brother        with [facility] estranged  . Colon cancer Neg Hx     Allergies  Allergen Reactions  . Iodinated Diagnostic Agents Itching  . Lisinopril Other (See Comments)    REACTION: lethargy, cough.  . Penicillins Other (See Comments)    LYMPH NODES SWELL Has patient had a PCN reaction causing immediate rash, facial/tongue/throat swelling, SOB or lightheadedness with hypotension: No Has patient had a PCN reaction causing severe rash involving mucus membranes or skin necrosis: No Has patient had a PCN reaction that required hospitalization No Has patient had a PCN reaction occurring within the last 10 years: No If all of the above answers are "NO", then may proceed with Cephalosporin use.     Medication list reviewed and updated in full in [facility].  GEN: No fevers, chills. Nontoxic. Primarily MSK c/o today. MSK: Detailed in the HPI GI: tolerating PO intake without difficulty Neuro: No numbness, parasthesias, or tingling associated. Otherwise the pertinent positives of the ROS are noted above.   Objective:   BP 118/70   Pulse 86   Temp 98.4 F (36.9 C) (Oral)   Ht 5' 0.75" (1.543 m)   Wt 298 lb 12 oz (135.5 kg)   BMI 56.91 kg/m    GEN: WDWN, NAD, Non-toxic, Alert & Oriented x 3 HEENT: Atraumatic, Normocephalic.  Ears and Nose: No external deformity. EXTR: No clubbing/cyanosis/ tr - 1+ edema b le NEURO: antalgic gait - difficulty rising from a seated  position PSYCH: Normally interactive. Conversant. Not depressed or anxious appearing.  Calm demeanor.    The patient lacks approximately 6 degrees of extension in both knees.  Flexion is dramatically decreased in both knees.  She is only able to flex the right knee approximately 35 degrees, and left knee she is able to flex this approximately 80 degrees in total.  Minimal movement at both patellas.  Extensive crepitus.  Medial and lateral joint line pain.  Stable MCL and LCL.  Other assessment of the knee is limited secondary to her dramatic loss of motion.  Radiology: Dg Knee 4 Views W/patella Left  Result Date: 11/29/2017 CLINICAL DATA:  Left knee pain, no known injury, initial encounter EXAM: LEFT KNEE - COMPLETE 4+ VIEW COMPARISON:  None. FINDINGS: Tricompartmental degenerative changes are noted most marked in the medial joint space. No soft tissue changes are seen. No acute fracture or dislocation is noted. IMPRESSION: Severe degenerative change without acute abnormality. Electronically Signed   By: Inez Catalina M.D.   On: 11/29/2017 21:15   Dg Knee 4 Views W/patella Right  Result Date: 11/29/2017 CLINICAL DATA:  Right knee pain chronic in nature, no known injury, initial encounter EXAM: RIGHT KNEE - COMPLETE 4+ VIEW COMPARISON:  None. FINDINGS: Degenerative changes are noted in all 3 joint compartments worst in the medial joint space. No joint effusion is seen. No acute fracture is noted. IMPRESSION: Degenerative change without acute abnormality. Electronically Signed   By: Inez Catalina M.D.   On: 11/29/2017 21:16     Assessment and Plan:   Primary osteoarthritis of both knees  Bilateral chronic knee pain - Plan: DG Knee 4 Views W/Patella Left, DG Knee 4 Views W/Patella Right, Amb Referral to Bariatric Surgery  Morbid obesity with BMI of 50.0-59.9, adult ([place]) - Plan: Amb Referral to Bariatric Surgery  Decreased range of motion of both knees  >25 minutes spent in face to face time  with patient, >50% spent in counselling or coordination of care   The patient has clinically the worst degenerative joint disease of the knee that I can recall seeing with tremendous loss of motion.  Her BMI is 57.  Nothing short of a total knee arthroplasty will help her regain functional use of her knee, but at BMI 57, this is not appropriate.  I reviewed some basic things for range of motion as well as symptom management, but in my opinion the patient needs to lose weight at all costs.  I referred her for bariatric medical management as well as consideration of bariatric surgery.  Truthfully, I think this is the only thing that can help her in terms of her extensive osteoarthritis   And remotely prepare her for possible joint replacement.  Follow-up: prn with me only  Orders Placed This Encounter  Procedures  . DG Knee 4 Views W/Patella Left  . DG Knee 4 Views W/Patella Right  . Amb Referral to Bariatric Surgery   Patient Instructions  Take Tylenol/Acetaminophen ES (532m) 2 tabs by mouth three times a day max as needed.   Aspercreme with 4% Lidocaine  Tylenol: 2 tablets up to 3-4 times a day  Topical Capzaicin Cream, as needed (wear glove to put on) Topical Voltaren (NSAID) Gel can help  For flares, corticosteroid injections help. Hyaluronic Acid injections have good success, average relief is 6 months  Glucosamine and Chondroitin often helpful - will take about 3 months to see if you have an effect. If you do, great, keep them up, if none at that point, no need to take in the future.  Omega-3 fish oils may help, 2 grams daily  Ice joints on bad days, 20 min, 2-3 x / day REGULAR EXERCISE: swimming, Yoga, Tai Chi, bicycle (NON-IMPACT activity)     Signed,  Zyann Mabry T. Ryosuke Ericksen, MD   Allergies as of 11/29/2017      Reactions   Iodinated Diagnostic Agents Itching   Lisinopril Other (See Comments)   REACTION: lethargy, cough.   Penicillins Other (See Comments)   LYMPH NODES  SWELL Has patient had a PCN reaction causing immediate rash, facial/tongue/throat swelling, SOB or lightheadedness with hypotension: No Has patient had a PCN reaction causing severe rash involving mucus membranes or skin necrosis: No Has patient had a PCN reaction that required hospitalization No Has patient had a PCN reaction occurring within the last 10 years: No If all of the above answers are "NO", then may proceed with Cephalosporin use.      Medication List        Accurate as of 11/29/17 11:59 PM. Always use your most recent med list.          amLODipine 5 MG tablet Commonly known as:  NORVASC Take 1 tablet (5 mg total) by mouth daily.   cetirizine 10 MG tablet Commonly known as:  ZYRTEC Take 10 mg by mouth at bedtime as needed for allergies.   hydrochlorothiazide 25 MG tablet Commonly known as:  HYDRODIURIL Take 1 tablet (25 mg total) by mouth daily.   latanoprost 0.005 % ophthalmic solution Commonly known as:  XALATAN Place 1 drop into both eyes at bedtime.   multivitamin with minerals Tabs tablet Take 1 tablet by mouth daily.   trolamine salicylate 10 % cream Commonly known as:  ASPERCREME Apply 1 application topically as needed (for knee pain).  Vitamin D 2000 units Caps Take 1 capsule (2,000 Units total) by mouth daily.

## 2017-11-29 NOTE — Patient Instructions (Addendum)
Take Tylenol/Acetaminophen ES (568m) 2 tabs by mouth three times a day max as needed.   Aspercreme with 4% Lidocaine  Tylenol: 2 tablets up to 3-4 times a day  Topical Capzaicin Cream, as needed (wear glove to put on) Topical Voltaren (NSAID) Gel can help  For flares, corticosteroid injections help. Hyaluronic Acid injections have good success, average relief is 6 months  Glucosamine and Chondroitin often helpful - will take about 3 months to see if you have an effect. If you do, great, keep them up, if none at that point, no need to take in the future.  Omega-3 fish oils may help, 2 grams daily  Ice joints on bad days, 20 min, 2-3 x / day REGULAR EXERCISE: swimming, Yoga, Tai Chi, bicycle (NON-IMPACT activity)

## 2018-09-28 ENCOUNTER — Other Ambulatory Visit: Payer: Self-pay | Admitting: Family Medicine

## 2018-09-28 DIAGNOSIS — Z1231 Encounter for screening mammogram for malignant neoplasm of breast: Secondary | ICD-10-CM

## 2018-10-31 IMAGING — DX DG KNEE COMPLETE 4+V*R*
3 series · 3 of 3 positions shown · non-contrast
Comparison: None.

CLINICAL DATA: Right knee pain chronic in nature, no known injury,
initial encounter

EXAM:
RIGHT KNEE - COMPLETE 4+ VIEW

[knee ap]
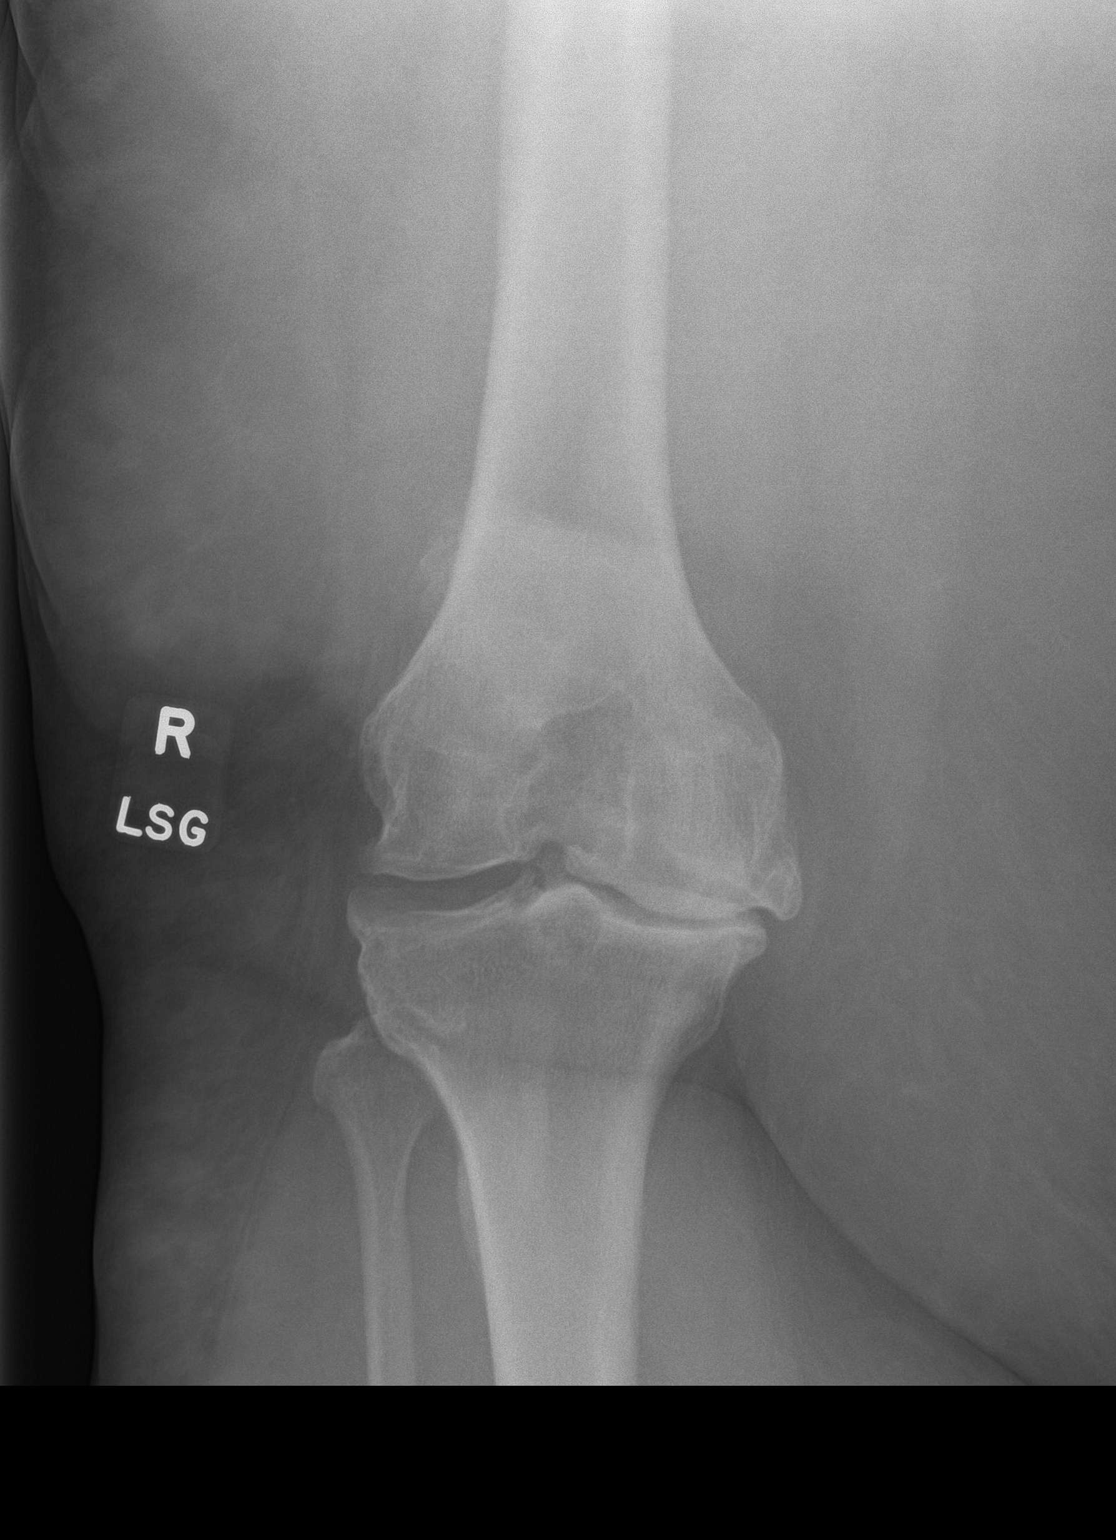

[knee tunnel]
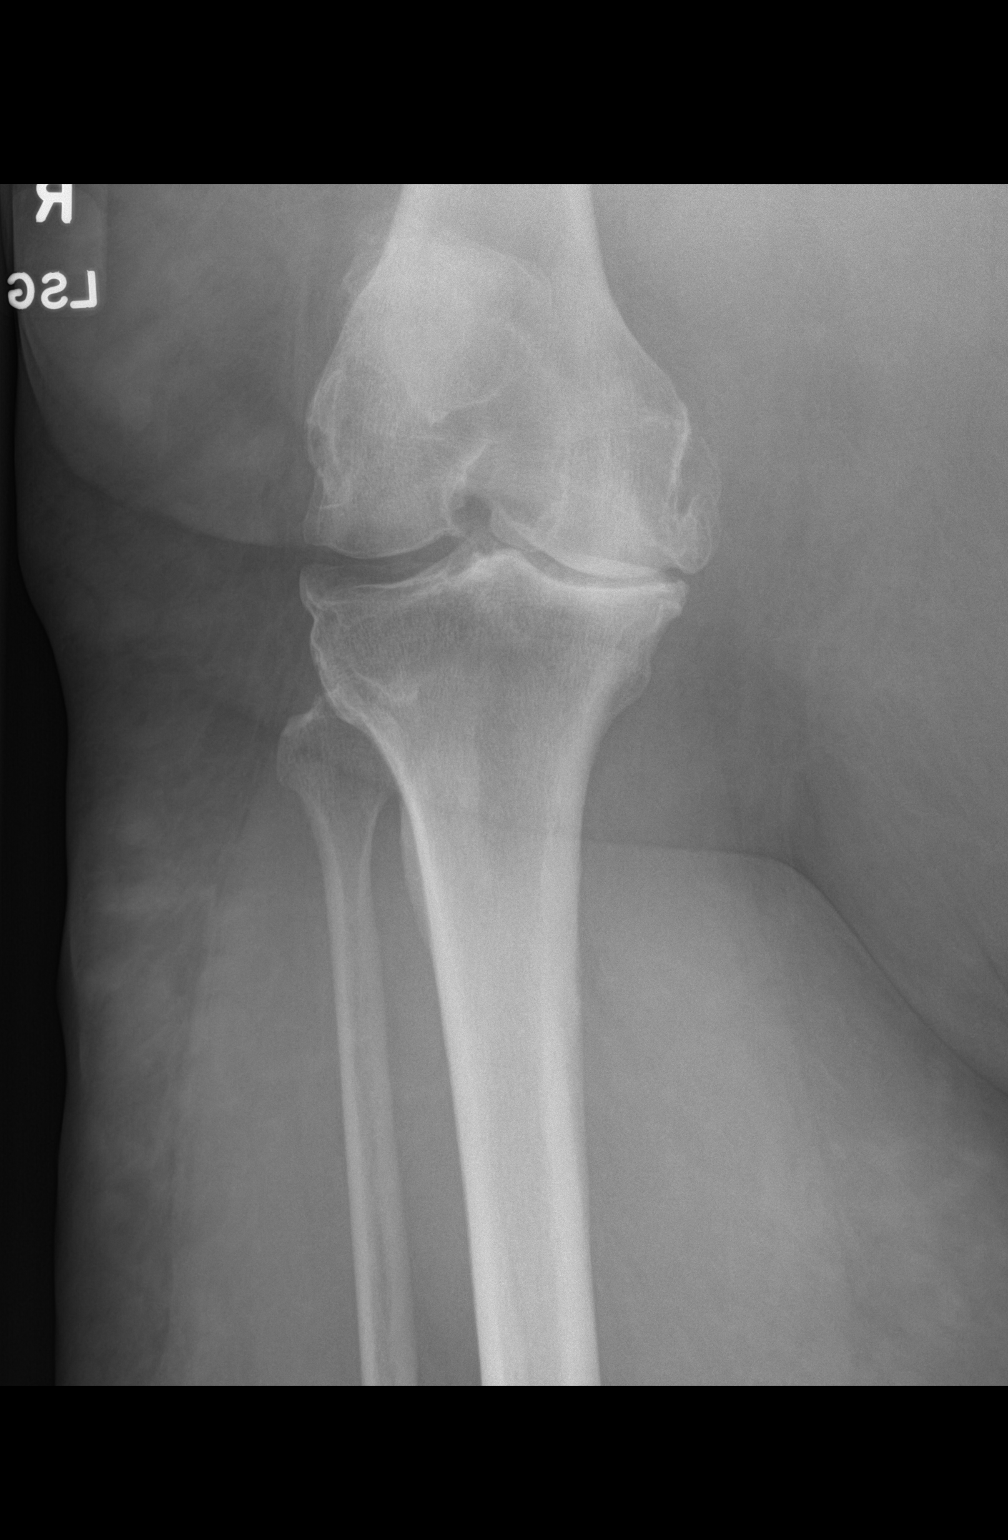

[knee lat]
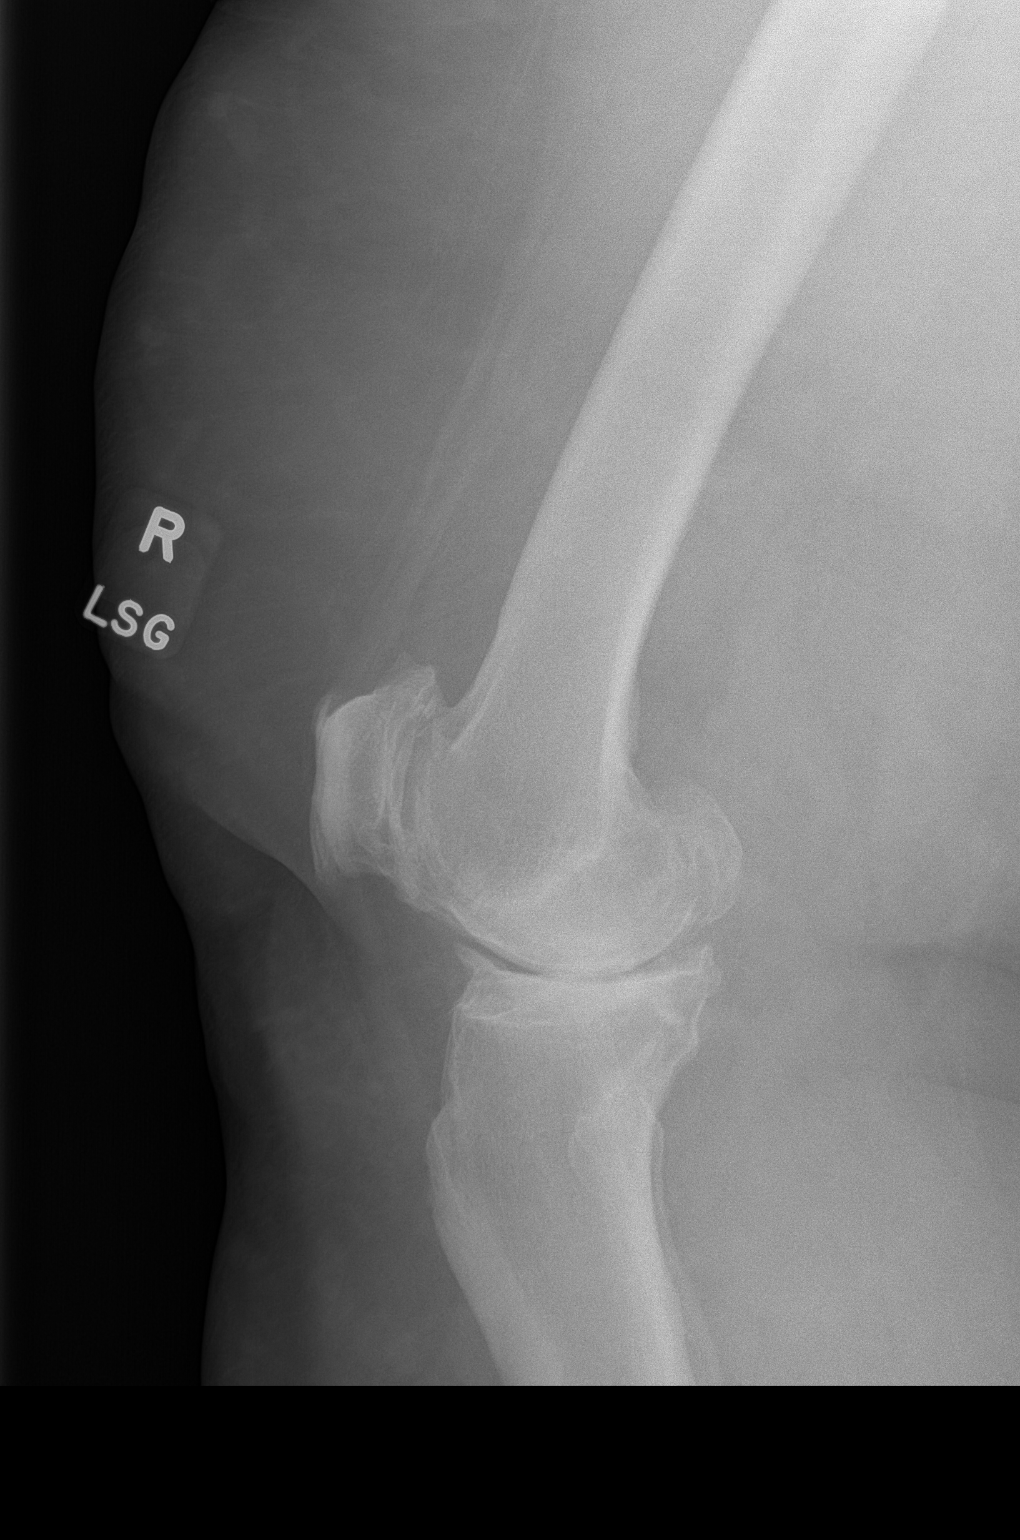

[3 of 3 positions shown; findings below may reference images not displayed]

FINDINGS: Degenerative changes are noted in all 3 joint compartments worst in
the medial joint space. No joint effusion is seen. No acute fracture
is noted.
IMPRESSION: Degenerative change without acute abnormality.

## 2018-11-15 ENCOUNTER — Other Ambulatory Visit: Payer: Self-pay

## 2018-11-15 ENCOUNTER — Ambulatory Visit
Admission: RE | Admit: 2018-11-15 | Discharge: 2018-11-15 | Disposition: A | Discharge status: Home / Self Care | Payer: BC Managed Care – PPO | Source: Ambulatory Visit | Attending: Family Medicine | Admitting: Family Medicine

## 2018-11-15 DIAGNOSIS — Z1231 Encounter for screening mammogram for malignant neoplasm of breast: Secondary | ICD-10-CM

## 2018-11-16 LAB — HM MAMMOGRAPHY

## 2018-11-17 ENCOUNTER — Other Ambulatory Visit: Payer: Self-pay | Admitting: Family Medicine

## 2018-11-17 DIAGNOSIS — I10 Essential (primary) hypertension: Secondary | ICD-10-CM

## 2018-11-17 DIAGNOSIS — E559 Vitamin D deficiency, unspecified: Secondary | ICD-10-CM

## 2018-11-17 DIAGNOSIS — R7303 Prediabetes: Secondary | ICD-10-CM

## 2018-11-17 DIAGNOSIS — E785 Hyperlipidemia, unspecified: Secondary | ICD-10-CM

## 2018-11-17 DIAGNOSIS — D649 Anemia, unspecified: Secondary | ICD-10-CM

## 2018-11-19 ENCOUNTER — Encounter: Payer: Self-pay | Admitting: Family Medicine

## 2018-11-21 ENCOUNTER — Other Ambulatory Visit (INDEPENDENT_AMBULATORY_CARE_PROVIDER_SITE_OTHER): Payer: BC Managed Care – PPO

## 2018-11-21 ENCOUNTER — Other Ambulatory Visit: Payer: Self-pay

## 2018-11-21 DIAGNOSIS — D649 Anemia, unspecified: Secondary | ICD-10-CM | POA: Diagnosis not present

## 2018-11-21 DIAGNOSIS — E785 Hyperlipidemia, unspecified: Secondary | ICD-10-CM

## 2018-11-21 DIAGNOSIS — E559 Vitamin D deficiency, unspecified: Secondary | ICD-10-CM

## 2018-11-21 DIAGNOSIS — R7303 Prediabetes: Secondary | ICD-10-CM

## 2018-11-21 DIAGNOSIS — I10 Essential (primary) hypertension: Secondary | ICD-10-CM | POA: Diagnosis not present

## 2018-11-21 LAB — CBC WITH DIFFERENTIAL/PLATELET
Basophils Absolute: 0 10*3/uL (ref 0.0–0.1)
Basophils Relative: 0.6 % (ref 0.0–3.0)
Eosinophils Absolute: 0.1 10*3/uL (ref 0.0–0.7)
Eosinophils Relative: 1.2 % (ref 0.0–5.0)
HCT: 37.4 % (ref 36.0–46.0)
Hemoglobin: 12.1 g/dL (ref 12.0–15.0)
Lymphocytes Relative: 47.7 % — ABNORMAL HIGH (ref 12.0–46.0)
Lymphs Abs: 2.3 10*3/uL (ref 0.7–4.0)
MCHC: 32.5 g/dL (ref 30.0–36.0)
MCV: 84.3 fl (ref 78.0–100.0)
Monocytes Absolute: 0.3 10*3/uL (ref 0.1–1.0)
Monocytes Relative: 6.7 % (ref 3.0–12.0)
Neutro Abs: 2.1 10*3/uL (ref 1.4–7.7)
Neutrophils Relative %: 43.8 % (ref 43.0–77.0)
Platelets: 208 10*3/uL (ref 150.0–400.0)
RBC: 4.44 Mil/uL (ref 3.87–5.11)
RDW: 15.4 % (ref 11.5–15.5)
WBC: 4.8 10*3/uL (ref 4.0–10.5)

## 2018-11-21 LAB — LIPID PANEL
Cholesterol: 178 mg/dL (ref 0–200)
HDL: 56.9 mg/dL (ref >39.00)
LDL Cholesterol: 109 mg/dL — ABNORMAL HIGH (ref 0–99)
NonHDL: 121.37
Total CHOL/HDL Ratio: 3
Triglycerides: 62 mg/dL (ref 0.0–149.0)
VLDL: 12.4 mg/dL (ref 0.0–40.0)

## 2018-11-21 LAB — COMPREHENSIVE METABOLIC PANEL
ALT: 15 U/L (ref 0–35)
AST: 16 U/L (ref 0–37)
Albumin: 4.1 g/dL (ref 3.5–5.2)
Alkaline Phosphatase: 47 U/L (ref 39–117)
BUN: 18 mg/dL (ref 6–23)
CO2: 30 mEq/L (ref 19–32)
Calcium: 9.6 mg/dL (ref 8.4–10.5)
Chloride: 103 mEq/L (ref 96–112)
Creatinine, Ser: 1.12 mg/dL (ref 0.40–1.20)
GFR: 59.89 mL/min — ABNORMAL LOW (ref >60.00)
Glucose, Bld: 110 mg/dL — ABNORMAL HIGH (ref 70–99)
Potassium: 3.8 mEq/L (ref 3.5–5.1)
Sodium: 140 mEq/L (ref 135–145)
Total Bilirubin: 0.5 mg/dL (ref 0.2–1.2)
Total Protein: 7.6 g/dL (ref 6.0–8.3)

## 2018-11-21 LAB — MICROALBUMIN / CREATININE URINE RATIO
Creatinine,U: 162.5 mg/dL
Microalb Creat Ratio: 0.7 mg/g (ref 0.0–30.0)
Microalb, Ur: 1.1 mg/dL (ref 0.0–1.9)

## 2018-11-21 LAB — VITAMIN D 25 HYDROXY (VIT D DEFICIENCY, FRACTURES): VITD: 38.25 ng/mL (ref 30.00–100.00)

## 2018-11-21 LAB — HEMOGLOBIN A1C: Hgb A1c MFr Bld: 6.3 % (ref 4.6–6.5)

## 2018-11-23 ENCOUNTER — Other Ambulatory Visit (HOSPITAL_COMMUNITY)
Admission: RE | Admit: 2018-11-23 | Discharge: 2018-11-23 | Disposition: A | Discharge status: Home / Self Care | Payer: BC Managed Care – PPO | Source: Ambulatory Visit | Attending: Family Medicine | Admitting: Family Medicine

## 2018-11-23 ENCOUNTER — Other Ambulatory Visit: Payer: Self-pay

## 2018-11-23 ENCOUNTER — Ambulatory Visit (INDEPENDENT_AMBULATORY_CARE_PROVIDER_SITE_OTHER): Payer: BC Managed Care – PPO | Admitting: Family Medicine

## 2018-11-23 ENCOUNTER — Encounter: Payer: Self-pay | Admitting: Family Medicine

## 2018-11-23 VITALS — BP 136/88 | HR 88 | Temp 98.1°F | Resp 20 | Ht 61.5 in | Wt 305.5 lb

## 2018-11-23 DIAGNOSIS — Z124 Encounter for screening for malignant neoplasm of cervix: Secondary | ICD-10-CM | POA: Insufficient documentation

## 2018-11-23 DIAGNOSIS — R7303 Prediabetes: Secondary | ICD-10-CM

## 2018-11-23 DIAGNOSIS — Z6841 Body Mass Index (BMI) 40.0 and over, adult: Secondary | ICD-10-CM

## 2018-11-23 DIAGNOSIS — E559 Vitamin D deficiency, unspecified: Secondary | ICD-10-CM | POA: Diagnosis not present

## 2018-11-23 DIAGNOSIS — M170 Bilateral primary osteoarthritis of knee: Secondary | ICD-10-CM

## 2018-11-23 DIAGNOSIS — Z0000 Encounter for general adult medical examination without abnormal findings: Secondary | ICD-10-CM | POA: Diagnosis not present

## 2018-11-23 DIAGNOSIS — Z23 Encounter for immunization: Secondary | ICD-10-CM | POA: Diagnosis not present

## 2018-11-23 DIAGNOSIS — H409 Unspecified glaucoma: Secondary | ICD-10-CM

## 2018-11-23 DIAGNOSIS — E785 Hyperlipidemia, unspecified: Secondary | ICD-10-CM

## 2018-11-23 DIAGNOSIS — I10 Essential (primary) hypertension: Secondary | ICD-10-CM

## 2018-11-23 MED ORDER — METFORMIN HCL 500 MG PO TABS: 500.0000 mg | ORAL_TABLET | Freq: Every day | ORAL | 1 refills | Status: DC

## 2018-11-23 NOTE — Assessment & Plan Note (Signed)
Now on 2 eye drops, sees eye doctor regularly.

## 2018-11-23 NOTE — Assessment & Plan Note (Signed)
Severe per SM eval 10/2017. Has been recommended bariatric surgery for weight loss.

## 2018-11-23 NOTE — Patient Instructions (Addendum)
Tdap today Schedule nurse visit in 1 month for first of 2 shingles shots (shingrix).  Trial metformin 500mg  once daily  Pap smear today Return as needed or in 1 year for next physical  Health Maintenance for Postmenopausal Women Menopause is a normal process in which your ability to get pregnant comes to an end. This process happens slowly over many months or years, usually between the ages of 19 and 59. Menopause is complete when you have missed your menstrual periods for 12 months. It is important to talk with your health care provider about some of the most common conditions that affect women after menopause (postmenopausal women). These include heart disease, cancer, and bone loss (osteoporosis). Adopting a healthy lifestyle and getting preventive care can help to promote your health and wellness. The actions you take can also lower your chances of developing some of these common conditions. What should I know about menopause? During menopause, you may get a number of symptoms, such as:  Hot flashes. These can be moderate or severe.  Night sweats.  Decrease in sex drive.  Mood swings.  Headaches.  Tiredness.  Irritability.  Memory problems.  Insomnia. Choosing to treat or not to treat these symptoms is a decision that you make with your health care provider. Do I need hormone replacement therapy?  Hormone replacement therapy is effective in treating symptoms that are caused by menopause, such as hot flashes and night sweats.  Hormone replacement carries certain risks, especially as you become older. If you are thinking about using estrogen or estrogen with progestin, discuss the benefits and risks with your health care provider. What is my risk for heart disease and stroke? The risk of heart disease, heart attack, and stroke increases as you age. One of the causes may be a change in the body's hormones during menopause. This can affect how your body uses dietary fats,  triglycerides, and cholesterol. Heart attack and stroke are medical emergencies. There are many things that you can do to help prevent heart disease and stroke. Watch your blood pressure  High blood pressure causes heart disease and increases the risk of stroke. This is more likely to develop in people who have high blood pressure readings, are of African descent, or are overweight.  Have your blood pressure checked: ? Every 3-5 years if you are 50-63 years of age. ? Every year if you are 32 years old or older. Eat a healthy diet   Eat a diet that includes plenty of vegetables, fruits, low-fat dairy products, and lean protein.  Do not eat a lot of foods that are high in solid fats, added sugars, or sodium. Get regular exercise Get regular exercise. This is one of the most important things you can do for your health. Most adults should:  Try to exercise for at least 150 minutes each week. The exercise should increase your heart rate and make you sweat (moderate-intensity exercise).  Try to do strengthening exercises at least twice each week. Do these in addition to the moderate-intensity exercise.  Spend less time sitting. Even light physical activity can be beneficial. Other tips  Work with your health care provider to achieve or maintain a healthy weight.  Do not use any products that contain nicotine or tobacco, such as cigarettes, e-cigarettes, and chewing tobacco. If you need help quitting, ask your health care provider.  Know your numbers. Ask your health care provider to check your cholesterol and your blood sugar (glucose). Continue to have your blood  tested as directed by your health care provider. Do I need screening for cancer? Depending on your health history and family history, you may need to have cancer screening at different stages of your life. This may include screening for:  Breast cancer.  Cervical cancer.  Lung cancer.  Colorectal cancer. What is my risk for  osteoporosis? After menopause, you may be at increased risk for osteoporosis. Osteoporosis is a condition in which bone destruction happens more quickly than new bone creation. To help prevent osteoporosis or the bone fractures that can happen because of osteoporosis, you may take the following actions:  If you are 18-71 years old, get at least 1,000 mg of calcium and at least 600 mg of vitamin D per day.  If you are older than age 57 but younger than age 19, get at least 1,200 mg of calcium and at least 600 mg of vitamin D per day.  If you are older than age 74, get at least 1,200 mg of calcium and at least 800 mg of vitamin D per day. Smoking and drinking excessive alcohol increase the risk of osteoporosis. Eat foods that are rich in calcium and vitamin D, and do weight-bearing exercises several times each week as directed by your health care provider. How does menopause affect my mental health? Depression may occur at any age, but it is more common as you become older. Common symptoms of depression include:  Low or sad mood.  Changes in sleep patterns.  Changes in appetite or eating patterns.  Feeling an overall lack of motivation or enjoyment of activities that you previously enjoyed.  Frequent crying spells. Talk with your health care provider if you think that you are experiencing depression. General instructions See your health care provider for regular wellness exams and vaccines. This may include:  Scheduling regular health, dental, and eye exams.  Getting and maintaining your vaccines. These include: ? Influenza vaccine. Get this vaccine each year before the flu season begins. ? Pneumonia vaccine. ? Shingles vaccine. ? Tetanus, diphtheria, and pertussis (Tdap) booster vaccine. Your health care provider may also recommend other immunizations. Tell your health care provider if you have ever been abused or do not feel safe at home. Summary  Menopause is a normal process in  which your ability to get pregnant comes to an end.  This condition causes hot flashes, night sweats, decreased interest in sex, mood swings, headaches, or lack of sleep.  Treatment for this condition may include hormone replacement therapy.  Take actions to keep yourself healthy, including exercising regularly, eating a healthy diet, watching your weight, and checking your blood pressure and blood sugar levels.  Get screened for cancer and depression. Make sure that you are up to date with all your vaccines. This information is not intended to replace advice given to you by your health care provider. Make sure you discuss any questions you have with your health care provider. Document Released: 06/10/2005 Document Revised: 04/11/2018 Document Reviewed: 04/11/2018 Elsevier Patient Education  2020 [employer].

## 2018-11-23 NOTE — Assessment & Plan Note (Signed)
See above. Will trial metformin.

## 2018-11-23 NOTE — Assessment & Plan Note (Signed)
Chronic, not on statin. The 10-year ASCVD risk score Mikey Bussing DC Brooke Bonito., et al., 2013) is: 16.3%   Values used to calculate the score:     Age: 63 years     Sex: Female     Is Non-Hispanic African American: Yes     Diabetic: Yes     Tobacco smoker: No     Systolic Blood Pressure: 876 mmHg     Is BP treated: Yes     HDL Cholesterol: 56.9 mg/dL     Total Cholesterol: 178 mg/dL

## 2018-11-23 NOTE — Progress Notes (Signed)
This visit was conducted in person.  BP 136/88   Pulse 88   Temp 98.1 F (36.7 C)   Resp 20   Ht 5' 1.5" (1.562 m)   Wt (!) 305 lb 8 oz (138.6 kg)   SpO2 99%   BMI 56.79 kg/m    CC: CPE Subjective:    Patient ID: Leah Peterson, female    DOB: 1955/08/29, 63 y.o.   MRN: [id number]  HPI: Leah Peterson is a 63 y.o. female presenting on 11/23/2018 for Annual Exam   Complicated 9892 - complex L ovarian mass due to inclusion cyst 2/2 diverticulitis s/p L salpingectomy 11/2017, then subsequent sigmoidectomy and BSO 03/2017.   [employer]- working from home.  Saw Dr Lorelei Pont - for L knee arthritis - has been advised bariatric surgery - needs to go to bariatric seminar.   Preventative: COLONOSCOPY WITH PROPOFOL Laterality: N/A Date: 06/30/2015 diverticulosis, rpt 10 yrs Gatha Mayer, MD) Mammogram 10/2018 [facility] Pap smear - always normal, last done 2016. Due this year. LMP 10/2007. No post menopausal sxs.  Flu shot yearly Tetanus 2010. Tdap today Pneumovax 07/2013 shingrix - interested.  Seat belt use discussed Sunscreen use discussed.No suspicious moles on skin. Ex smoker - quit remotely Alcohol - seldom Dentist - Q6 mo Eye exam - Q6 mo  Caffeine: hot tea in the morning Lives with daughter and grand daughter. No pets.  Divorced; 1 daughter Occupation: Administrator at [employer]  Activity: walks lap at work.  Diet: good water, fruits/vegetables daily     Relevant past medical, surgical, family and social history reviewed and updated as indicated. Interim medical history since our last visit reviewed. Allergies and medications reviewed and updated. Outpatient Medications Prior to Visit  Medication Sig Dispense Refill  . amLODipine (NORVASC) 5 MG tablet Take 1 tablet (5 mg total) by mouth daily. 90 tablet 3  . cetirizine (ZYRTEC) 10 MG tablet Take 10 mg by mouth at bedtime as needed for allergies.     . Cholecalciferol (VITAMIN D) 2000 units CAPS Take  1 capsule (2,000 Units total) by mouth daily. 30 capsule   . hydrochlorothiazide (HYDRODIURIL) 25 MG tablet Take 1 tablet (25 mg total) by mouth daily. 90 tablet 3  . latanoprost (XALATAN) 0.005 % ophthalmic solution Place 1 drop into both eyes at bedtime.   0  . Multiple Vitamin (MULTIVITAMIN WITH MINERALS) TABS tablet Take 1 tablet by mouth daily.    . timolol (TIMOPTIC) 0.5 % ophthalmic solution INSTILL 1 DROP INTO BOTH EYES EVERY DAY    . trolamine salicylate (ASPERCREME) 10 % cream Apply 1 application topically as needed (for knee pain).      No facility-administered medications prior to visit.      Per HPI unless specifically indicated in ROS section below Review of Systems  Constitutional: Negative for activity change, appetite change, chills, fatigue, fever and unexpected weight change.  HENT: Negative for hearing loss.   Eyes: Negative for visual disturbance.  Respiratory: Negative for cough, chest tightness, shortness of breath and wheezing.   Cardiovascular: Negative for chest pain, palpitations and leg swelling.  Gastrointestinal: Negative for abdominal distention, abdominal pain, blood in stool, constipation, diarrhea, nausea and vomiting.  Genitourinary: Negative for difficulty urinating and hematuria.  Musculoskeletal: Negative for arthralgias, myalgias and neck pain.  Skin: Negative for rash.  Neurological: Positive for headaches (sinus pressure). Negative for dizziness, seizures and syncope.  Hematological: Negative for adenopathy. Does not bruise/bleed easily.  Psychiatric/Behavioral: Negative for dysphoric mood.  The patient is not nervous/anxious.    Objective:    BP 136/88   Pulse 88   Temp 98.1 F (36.7 C)   Resp 20   Ht 5' 1.5" (1.562 m)   Wt (!) 305 lb 8 oz (138.6 kg)   SpO2 99%   BMI 56.79 kg/m   Wt Readings from Last 3 Encounters:  11/23/18 (!) 305 lb 8 oz (138.6 kg)  11/29/17 298 lb 12 oz (135.5 kg)  11/17/17 290 lb 8 oz (131.8 kg)    Physical Exam  Vitals signs and nursing note reviewed. Exam conducted with a chaperone present.  Constitutional:      General: She is not in acute distress.    Appearance: Normal appearance. She is well-developed. She is obese. She is not ill-appearing.  HENT:     Head: Normocephalic and atraumatic.     Right Ear: Hearing, tympanic membrane, ear canal and external ear normal.     Left Ear: Hearing, tympanic membrane, ear canal and external ear normal.     Nose: Nose normal.     Mouth/Throat:     Mouth: Mucous membranes are moist.     Pharynx: Uvula midline. No oropharyngeal exudate or posterior oropharyngeal erythema.  Eyes:     General: No scleral icterus.    Extraocular Movements: Extraocular movements intact.     Conjunctiva/sclera: Conjunctivae normal.     Pupils: Pupils are equal, round, and reactive to light.  Neck:     Musculoskeletal: Normal range of motion and neck supple.  Cardiovascular:     Rate and Rhythm: Normal rate and regular rhythm.     Pulses: Normal pulses.          Radial pulses are 2+ on the right side and 2+ on the left side.     Heart sounds: Normal heart sounds. No murmur.  Pulmonary:     Effort: Pulmonary effort is normal. No respiratory distress.     Breath sounds: Normal breath sounds. No wheezing, rhonchi or rales.  Abdominal:     General: Bowel sounds are normal. There is no distension.     Palpations: Abdomen is soft. There is no mass.     Tenderness: There is no abdominal tenderness. There is no guarding or rebound.     Hernia: No hernia is present.  Genitourinary:    Exam position: Supine.     Pubic Area: No rash.      Labia:        Right: No rash or tenderness.        Left: No rash or tenderness.      Urethra: No prolapse.     Vagina: Normal.     Comments: Difficult pap, trouble visualizing cervix due to body habitus Musculoskeletal: Normal range of motion.  Lymphadenopathy:     Cervical: No cervical adenopathy.  Skin:    General: Skin is warm.      Findings: No rash.  Neurological:     General: No focal deficit present.     Mental Status: She is alert and oriented to person, place, and time.     Comments: CN grossly intact, station and gait intact  Psychiatric:        Mood and Affect: Mood normal.        Behavior: Behavior normal.        Thought Content: Thought content normal.        Judgment: Judgment normal.       Results for orders placed or  performed in visit on 11/21/18  CBC with Differential/Platelet  Result Value Ref Range   WBC 4.8 4.0 - 10.5 K/uL   RBC 4.44 3.87 - 5.11 Mil/uL   Hemoglobin 12.1 12.0 - 15.0 g/dL   HCT 37.4 36.0 - 46.0 %   MCV 84.3 78.0 - 100.0 fl   MCHC 32.5 30.0 - 36.0 g/dL   RDW 15.4 11.5 - 15.5 %   Platelets 208.0 150.0 - 400.0 K/uL   Neutrophils Relative % 43.8 43.0 - 77.0 %   Lymphocytes Relative 47.7 (H) 12.0 - 46.0 %   Monocytes Relative 6.7 3.0 - 12.0 %   Eosinophils Relative 1.2 0.0 - 5.0 %   Basophils Relative 0.6 0.0 - 3.0 %   Neutro Abs 2.1 1.4 - 7.7 K/uL   Lymphs Abs 2.3 0.7 - 4.0 K/uL   Monocytes Absolute 0.3 0.1 - 1.0 K/uL   Eosinophils Absolute 0.1 0.0 - 0.7 K/uL   Basophils Absolute 0.0 0.0 - 0.1 K/uL  Microalbumin / creatinine urine ratio  Result Value Ref Range   Microalb, Ur 1.1 0.0 - 1.9 mg/dL   Creatinine,U 162.5 mg/dL   Microalb Creat Ratio 0.7 0.0 - 30.0 mg/g  Hemoglobin A1c  Result Value Ref Range   Hgb A1c MFr Bld 6.3 4.6 - 6.5 %  Comprehensive metabolic panel  Result Value Ref Range   Sodium 140 135 - 145 mEq/L   Potassium 3.8 3.5 - 5.1 mEq/L   Chloride 103 96 - 112 mEq/L   CO2 30 19 - 32 mEq/L   Glucose, Bld 110 (H) 70 - 99 mg/dL   BUN 18 6 - 23 mg/dL   Creatinine, Ser 1.12 0.40 - 1.20 mg/dL   Total Bilirubin 0.5 0.2 - 1.2 mg/dL   Alkaline Phosphatase 47 39 - 117 U/L   AST 16 0 - 37 U/L   ALT 15 0 - 35 U/L   Total Protein 7.6 6.0 - 8.3 g/dL   Albumin 4.1 3.5 - 5.2 g/dL   Calcium 9.6 8.4 - 10.5 mg/dL   GFR 59.89 (L) >60.00 mL/min  Lipid panel  Result  Value Ref Range   Cholesterol 178 0 - 200 mg/dL   Triglycerides 62.0 0.0 - 149.0 mg/dL   HDL 56.90 >39.00 mg/dL   VLDL 12.4 0.0 - 40.0 mg/dL   LDL Cholesterol 109 (H) 0 - 99 mg/dL   Total CHOL/HDL Ratio 3    NonHDL 121.37   VITAMIN D 25 Hydroxy (Vit-D Deficiency, Fractures)  Result Value Ref Range   VITD 38.25 30.00 - 100.00 ng/mL   Assessment & Plan:   Problem List Items Addressed This Visit    Vitamin D deficiency    Levels repleted on total 4000 IU daily.       Prediabetes    See above. Will trial metformin.       Morbid obesity with BMI of 50.0-59.9, adult ([place])    Activity limited by knee arthritis. Has been suggested bariatric seminar. In prediabetes and anticipated insulin resistance (was unable to add insulin to labs) suggested trial metformin. She would like to try this.       Relevant Medications   metFORMIN (GLUCOPHAGE) 500 MG tablet   HYPERTENSION, BENIGN ESSENTIAL    Chronic, stable. Continue current regimen.       HLD (hyperlipidemia)    Chronic, not on statin. The 10-year ASCVD risk score Mikey Bussing DC Jr., et al., 2013) is: 16.3%   Values used to calculate the score:  Age: 63 years     Sex: Female     Is Non-Hispanic African American: Yes     Diabetic: Yes     Tobacco smoker: No     Systolic Blood Pressure: 625 mmHg     Is BP treated: Yes     HDL Cholesterol: 56.9 mg/dL     Total Cholesterol: 178 mg/dL       Healthcare maintenance - Primary    Preventative protocols reviewed and updated unless pt declined. Discussed healthy diet and lifestyle.  Difficult exam - await pap results, consider GYN referral for future exam.       Glaucoma    Now on 2 eye drops, sees eye doctor regularly.      Relevant Medications   timolol (TIMOPTIC) 0.5 % ophthalmic solution   Degenerative arthritis of knee, bilateral    Severe per SM eval 10/2017. Has been recommended bariatric surgery for weight loss.        Other Visit Diagnoses    Cervical cancer screening        Relevant Orders   Cytology - PAP([place])   Need for Tdap vaccination           Meds ordered this encounter  Medications  . metFORMIN (GLUCOPHAGE) 500 MG tablet    Sig: Take 1 tablet (500 mg total) by mouth daily with breakfast.    Dispense:  90 tablet    Refill:  1   Orders Placed This Encounter  Procedures  . Tdap vaccine greater than or equal to 7yo IM    Patient instructions: Tdap today Schedule nurse visit in 1 month for first of 2 shingles shots (shingrix).  Trial metformin 500mg  once daily  Pap smear today Return as needed or in 1 year for next physical  Follow up plan: Return in about 1 year (around 11/23/2019) for annual exam, prior fasting for blood work.  Ria Bush, MD

## 2018-11-23 NOTE — Assessment & Plan Note (Signed)
Activity limited by knee arthritis. Has been suggested bariatric seminar. In prediabetes and anticipated insulin resistance (was unable to add insulin to labs) suggested trial metformin. She would like to try this.

## 2018-11-23 NOTE — Assessment & Plan Note (Signed)
Chronic, stable. Continue current regimen. 

## 2018-11-23 NOTE — Assessment & Plan Note (Signed)
Levels repleted on total 4000 IU daily.

## 2018-11-23 NOTE — Assessment & Plan Note (Addendum)
Preventative protocols reviewed and updated unless pt declined. Discussed healthy diet and lifestyle.  Difficult exam - await pap results, consider GYN referral for future exam.

## 2018-11-28 LAB — CYTOLOGY - PAP
Adequacy: ABSENT
Diagnosis: NEGATIVE
HPV: NOT DETECTED

## 2018-12-25 ENCOUNTER — Ambulatory Visit (INDEPENDENT_AMBULATORY_CARE_PROVIDER_SITE_OTHER): Payer: BC Managed Care – PPO

## 2018-12-25 DIAGNOSIS — Z23 Encounter for immunization: Secondary | ICD-10-CM | POA: Diagnosis not present

## 2019-01-15 ENCOUNTER — Other Ambulatory Visit: Payer: Self-pay | Admitting: Family Medicine

## 2019-01-29 ENCOUNTER — Ambulatory Visit (INDEPENDENT_AMBULATORY_CARE_PROVIDER_SITE_OTHER): Payer: BC Managed Care – PPO

## 2019-01-29 DIAGNOSIS — Z23 Encounter for immunization: Secondary | ICD-10-CM | POA: Diagnosis not present

## 2019-01-30 ENCOUNTER — Other Ambulatory Visit: Payer: Self-pay | Admitting: Family Medicine

## 2019-02-26 ENCOUNTER — Ambulatory Visit: Payer: BC Managed Care – PPO

## 2019-03-05 ENCOUNTER — Ambulatory Visit (INDEPENDENT_AMBULATORY_CARE_PROVIDER_SITE_OTHER): Payer: BC Managed Care – PPO | Admitting: *Deleted

## 2019-03-05 DIAGNOSIS — Z23 Encounter for immunization: Secondary | ICD-10-CM | POA: Diagnosis not present

## 2019-03-05 NOTE — Progress Notes (Signed)
Per orders of Dr. Danise Mina, injection of Shingrix given by Lauralyn Primes. Patient tolerated injection well.

## 2019-06-10 ENCOUNTER — Other Ambulatory Visit: Payer: Self-pay | Admitting: Family Medicine

## 2019-10-01 ENCOUNTER — Encounter: Payer: Self-pay | Admitting: Family Medicine

## 2019-10-04 ENCOUNTER — Other Ambulatory Visit: Payer: Self-pay | Admitting: Family Medicine

## 2019-10-04 DIAGNOSIS — Z1231 Encounter for screening mammogram for malignant neoplasm of breast: Secondary | ICD-10-CM

## 2019-11-18 ENCOUNTER — Other Ambulatory Visit: Payer: Self-pay

## 2019-11-18 ENCOUNTER — Ambulatory Visit
Admission: RE | Admit: 2019-11-18 | Discharge: 2019-11-18 | Disposition: A | Discharge status: Home / Self Care | Payer: BC Managed Care – PPO | Source: Ambulatory Visit | Attending: Family Medicine | Admitting: Family Medicine

## 2019-11-18 DIAGNOSIS — Z1231 Encounter for screening mammogram for malignant neoplasm of breast: Secondary | ICD-10-CM

## 2019-11-21 ENCOUNTER — Other Ambulatory Visit: Payer: Self-pay | Admitting: Family Medicine

## 2019-11-21 DIAGNOSIS — R7303 Prediabetes: Secondary | ICD-10-CM

## 2019-11-21 DIAGNOSIS — I10 Essential (primary) hypertension: Secondary | ICD-10-CM

## 2019-11-21 DIAGNOSIS — E785 Hyperlipidemia, unspecified: Secondary | ICD-10-CM

## 2019-11-21 DIAGNOSIS — E559 Vitamin D deficiency, unspecified: Secondary | ICD-10-CM

## 2019-11-22 ENCOUNTER — Other Ambulatory Visit (INDEPENDENT_AMBULATORY_CARE_PROVIDER_SITE_OTHER): Payer: BC Managed Care – PPO

## 2019-11-22 ENCOUNTER — Other Ambulatory Visit: Payer: Self-pay

## 2019-11-22 DIAGNOSIS — R7303 Prediabetes: Secondary | ICD-10-CM

## 2019-11-22 DIAGNOSIS — E559 Vitamin D deficiency, unspecified: Secondary | ICD-10-CM | POA: Diagnosis not present

## 2019-11-22 DIAGNOSIS — E785 Hyperlipidemia, unspecified: Secondary | ICD-10-CM

## 2019-11-22 DIAGNOSIS — I10 Essential (primary) hypertension: Secondary | ICD-10-CM

## 2019-11-22 LAB — VITAMIN D 25 HYDROXY (VIT D DEFICIENCY, FRACTURES): VITD: 38.81 ng/mL (ref 30.00–100.00)

## 2019-11-22 LAB — COMPREHENSIVE METABOLIC PANEL
ALT: 13 U/L (ref 0–35)
AST: 14 U/L (ref 0–37)
Albumin: 4.1 g/dL (ref 3.5–5.2)
Alkaline Phosphatase: 47 U/L (ref 39–117)
BUN: 19 mg/dL (ref 6–23)
CO2: 31 mEq/L (ref 19–32)
Calcium: 9.5 mg/dL (ref 8.4–10.5)
Chloride: 103 mEq/L (ref 96–112)
Creatinine, Ser: 1.05 mg/dL (ref 0.40–1.20)
GFR: 64.31 mL/min (ref >60.00)
Glucose, Bld: 95 mg/dL (ref 70–99)
Potassium: 3.8 mEq/L (ref 3.5–5.1)
Sodium: 141 mEq/L (ref 135–145)
Total Bilirubin: 0.4 mg/dL (ref 0.2–1.2)
Total Protein: 7.3 g/dL (ref 6.0–8.3)

## 2019-11-22 LAB — LIPID PANEL
Cholesterol: 162 mg/dL (ref 0–200)
HDL: 54 mg/dL (ref >39.00)
LDL Cholesterol: 96 mg/dL (ref 0–99)
NonHDL: 108.16
Total CHOL/HDL Ratio: 3
Triglycerides: 63 mg/dL (ref 0.0–149.0)
VLDL: 12.6 mg/dL (ref 0.0–40.0)

## 2019-11-22 LAB — MICROALBUMIN / CREATININE URINE RATIO
Creatinine,U: 146.2 mg/dL
Microalb Creat Ratio: 0.7 mg/g (ref 0.0–30.0)
Microalb, Ur: 1 mg/dL (ref 0.0–1.9)

## 2019-11-22 LAB — HEMOGLOBIN A1C: Hgb A1c MFr Bld: 6.1 % (ref 4.6–6.5)

## 2019-11-22 NOTE — Addendum Note (Signed)
Addended by: Cloyd Stagers on: 11/22/2019 09:32 AM   Modules accepted: Orders

## 2019-11-25 ENCOUNTER — Ambulatory Visit (INDEPENDENT_AMBULATORY_CARE_PROVIDER_SITE_OTHER): Payer: BC Managed Care – PPO | Admitting: Family Medicine

## 2019-11-25 ENCOUNTER — Encounter: Payer: Self-pay | Admitting: Family Medicine

## 2019-11-25 ENCOUNTER — Other Ambulatory Visit: Payer: Self-pay

## 2019-11-25 VITALS — BP 130/82 | HR 72 | Temp 97.0°F | Ht 61.5 in | Wt 305.4 lb

## 2019-11-25 DIAGNOSIS — E1169 Type 2 diabetes mellitus with other specified complication: Secondary | ICD-10-CM

## 2019-11-25 DIAGNOSIS — E119 Type 2 diabetes mellitus without complications: Secondary | ICD-10-CM | POA: Insufficient documentation

## 2019-11-25 DIAGNOSIS — R202 Paresthesia of skin: Secondary | ICD-10-CM | POA: Diagnosis not present

## 2019-11-25 DIAGNOSIS — I890 Lymphedema, not elsewhere classified: Secondary | ICD-10-CM | POA: Insufficient documentation

## 2019-11-25 DIAGNOSIS — H409 Unspecified glaucoma: Secondary | ICD-10-CM

## 2019-11-25 DIAGNOSIS — I10 Essential (primary) hypertension: Secondary | ICD-10-CM

## 2019-11-25 DIAGNOSIS — R600 Localized edema: Secondary | ICD-10-CM

## 2019-11-25 DIAGNOSIS — M170 Bilateral primary osteoarthritis of knee: Secondary | ICD-10-CM

## 2019-11-25 DIAGNOSIS — E785 Hyperlipidemia, unspecified: Secondary | ICD-10-CM

## 2019-11-25 DIAGNOSIS — Z0000 Encounter for general adult medical examination without abnormal findings: Secondary | ICD-10-CM

## 2019-11-25 DIAGNOSIS — Z6841 Body Mass Index (BMI) 40.0 and over, adult: Secondary | ICD-10-CM

## 2019-11-25 DIAGNOSIS — E559 Vitamin D deficiency, unspecified: Secondary | ICD-10-CM | POA: Diagnosis not present

## 2019-11-25 DIAGNOSIS — E118 Type 2 diabetes mellitus with unspecified complications: Secondary | ICD-10-CM

## 2019-11-25 NOTE — Patient Instructions (Addendum)
We will refer you to nutritionist.  Labs today to check thyroid and b12 levels  Work on healthy diet choices and non weight bearing exercise to conserve cartilage.  Return in 6 months for diabetes follow up visit.   Health Maintenance for Postmenopausal Women Menopause is a normal process in which your ability to get pregnant comes to an end. This process happens slowly over many months or years, usually between the ages of 44 and 55. Menopause is complete when you have missed your menstrual periods for 12 months. It is important to talk with your health care provider about some of the most common conditions that affect women after menopause (postmenopausal women). These include heart disease, cancer, and bone loss (osteoporosis). Adopting a healthy lifestyle and getting preventive care can help to promote your health and wellness. The actions you take can also lower your chances of developing some of these common conditions. What should I know about menopause? During menopause, you may get a number of symptoms, such as:  Hot flashes. These can be moderate or severe.  Night sweats.  Decrease in sex drive.  Mood swings.  Headaches.  Tiredness.  Irritability.  Memory problems.  Insomnia. Choosing to treat or not to treat these symptoms is a decision that you make with your health care provider. Do I need hormone replacement therapy?  Hormone replacement therapy is effective in treating symptoms that are caused by menopause, such as hot flashes and night sweats.  Hormone replacement carries certain risks, especially as you become older. If you are thinking about using estrogen or estrogen with progestin, discuss the benefits and risks with your health care provider. What is my risk for heart disease and stroke? The risk of heart disease, heart attack, and stroke increases as you age. One of the causes may be a change in the body's hormones during menopause. This can affect how your  body uses dietary fats, triglycerides, and cholesterol. Heart attack and stroke are medical emergencies. There are many things that you can do to help prevent heart disease and stroke. Watch your blood pressure  High blood pressure causes heart disease and increases the risk of stroke. This is more likely to develop in people who have high blood pressure readings, are of African descent, or are overweight.  Have your blood pressure checked: ? Every 3-5 years if you are 74-52 years of age. ? Every year if you are 21 years old or older. Eat a healthy diet   Eat a diet that includes plenty of vegetables, fruits, low-fat dairy products, and lean protein.  Do not eat a lot of foods that are high in solid fats, added sugars, or sodium. Get regular exercise Get regular exercise. This is one of the most important things you can do for your health. Most adults should:  Try to exercise for at least 150 minutes each week. The exercise should increase your heart rate and make you sweat (moderate-intensity exercise).  Try to do strengthening exercises at least twice each week. Do these in addition to the moderate-intensity exercise.  Spend less time sitting. Even light physical activity can be beneficial. Other tips  Work with your health care provider to achieve or maintain a healthy weight.  Do not use any products that contain nicotine or tobacco, such as cigarettes, e-cigarettes, and chewing tobacco. If you need help quitting, ask your health care provider.  Know your numbers. Ask your health care provider to check your cholesterol and your blood sugar (glucose).  Continue to have your blood tested as directed by your health care provider. Do I need screening for cancer? Depending on your health history and family history, you may need to have cancer screening at different stages of your life. This may include screening for:  Breast cancer.  Cervical cancer.  Lung cancer.  Colorectal  cancer. What is my risk for osteoporosis? After menopause, you may be at increased risk for osteoporosis. Osteoporosis is a condition in which bone destruction happens more quickly than new bone creation. To help prevent osteoporosis or the bone fractures that can happen because of osteoporosis, you may take the following actions:  If you are 63-78 years old, get at least 1,000 mg of calcium and at least 600 mg of vitamin D per day.  If you are older than age 46 but younger than age 39, get at least 1,200 mg of calcium and at least 600 mg of vitamin D per day.  If you are older than age 76, get at least 1,200 mg of calcium and at least 800 mg of vitamin D per day. Smoking and drinking excessive alcohol increase the risk of osteoporosis. Eat foods that are rich in calcium and vitamin D, and do weight-bearing exercises several times each week as directed by your health care provider. How does menopause affect my mental health? Depression may occur at any age, but it is more common as you become older. Common symptoms of depression include:  Low or sad mood.  Changes in sleep patterns.  Changes in appetite or eating patterns.  Feeling an overall lack of motivation or enjoyment of activities that you previously enjoyed.  Frequent crying spells. Talk with your health care provider if you think that you are experiencing depression. General instructions See your health care provider for regular wellness exams and vaccines. This may include:  Scheduling regular health, dental, and eye exams.  Getting and maintaining your vaccines. These include: ? Influenza vaccine. Get this vaccine each year before the flu season begins. ? Pneumonia vaccine. ? Shingles vaccine. ? Tetanus, diphtheria, and pertussis (Tdap) booster vaccine. Your health care provider may also recommend other immunizations. Tell your health care provider if you have ever been abused or do not feel safe at  home. Summary  Menopause is a normal process in which your ability to get pregnant comes to an end.  This condition causes hot flashes, night sweats, decreased interest in sex, mood swings, headaches, or lack of sleep.  Treatment for this condition may include hormone replacement therapy.  Take actions to keep yourself healthy, including exercising regularly, eating a healthy diet, watching your weight, and checking your blood pressure and blood sugar levels.  Get screened for cancer and depression. Make sure that you are up to date with all your vaccines. This information is not intended to replace advice given to you by your health care provider. Make sure you discuss any questions you have with your health care provider. Document Revised: 04/11/2018 Document Reviewed: 04/11/2018 Elsevier Patient Education  2020 [employer].

## 2019-11-25 NOTE — Progress Notes (Signed)
This visit was conducted in person.  BP (!) 130/82 (BP Location: Left Arm, Patient Position: Sitting, Cuff Size: Normal)    Pulse 72    Temp (!) 97 F (36.1 C) (Temporal)    Ht 5' 1.5" (1.562 m)    Wt (!) 305 lb 6.4 oz (138.5 kg)    SpO2 99%    BMI 56.77 kg/m    CC: CPE Subjective:    Patient ID: Leah Peterson, female    DOB: [birth year], 64 y.o.   MRN: [id number]  HPI: Leah Peterson is a 64 y.o. 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 Has been recommended bariatric surgery for knee osteoarthritis. Has started joint movement with glucosamine and vit D.   Noted leg paresthesias with prolonged sitting 3 wks ago led to fall due to complete loss sensation of legs.   Preventative: COLONOSCOPY WITH PROPOFOL Laterality: N/A Date: 06/30/2015 diverticulosis, rpt 10 yrs Gatha Mayer, MD) Mammogram 10/2019 Birads1 Pap smear - always normal, last done 10/2018 - normal but absent ECC zone - consider rpt 2-3 yrs LMP 10/2007. No post menopausal sxs.  Flu shot yearly J&J vaccine 07/2019  Tetanus 2010. Tdap 10/2018 Pneumovax 07/2013  shingrix - completed 03/2019 Seat belt use discussed Sunscreen use discussed.No suspicious moles on skin. Ex smoker - quit remotely Alcohol - seldom  Dentist - Q6 mo  Eye exam - Q6 mo  Caffeine:hot tea in the morning Lives with daughter and grand daughter. No pets.  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 Diet: good water, fruits/vegetables daily     Relevant past medical, surgical, family and social history reviewed and updated as indicated. Interim medical history since our last visit reviewed. Allergies and medications reviewed and updated. Outpatient Medications Prior to Visit  Medication Sig Dispense Refill    amLODipine (NORVASC) 5 MG tablet TAKE 1 TABLET BY MOUTH EVERY DAY 90 tablet 3   cetirizine (ZYRTEC) 10 MG tablet Take 10 mg by mouth at bedtime as needed for allergies.      Cholecalciferol (VITAMIN D) 2000 units CAPS Take 1 capsule (2,000 Units total) by mouth daily. 30 capsule    hydrochlorothiazide (HYDRODIURIL) 25 MG tablet TAKE 1 TABLET BY MOUTH EVERY DAY 90 tablet 3   latanoprost (XALATAN) 0.005 % ophthalmic solution Place 1 drop into both eyes at bedtime.   0   metFORMIN (GLUCOPHAGE) 500 MG tablet TAKE 1 TABLET BY MOUTH EVERY DAY WITH BREAKFAST 90 tablet 1   Multiple Vitamin (MULTIVITAMIN WITH MINERALS) TABS tablet Take 1 tablet by mouth daily.     timolol (TIMOPTIC) 0.5 % ophthalmic solution INSTILL 1 DROP INTO BOTH EYES EVERY DAY     trolamine salicylate (ASPERCREME) 10 % cream Apply 1 application topically as needed (for knee pain).      No facility-administered medications prior to visit.     Per HPI unless specifically indicated in ROS section below Review of Systems  Constitutional: Negative for activity change, appetite change, chills, fatigue, fever and unexpected weight change.  HENT: Negative for hearing loss.   Eyes: Negative for visual disturbance.  Respiratory: Negative for cough, chest tightness, shortness of breath and wheezing.   Cardiovascular: Positive for leg swelling (persistent swelling for last few months, with increasing varicose veins). Negative for chest pain and palpitations.  Gastrointestinal: Negative for  abdominal distention, abdominal pain, blood in stool, constipation, diarrhea, nausea and vomiting.  Genitourinary: Negative for difficulty urinating and hematuria.  Musculoskeletal: Negative for arthralgias, myalgias and neck pain.  Skin: Negative for rash.  Neurological: Negative for dizziness, seizures, syncope and headaches.  Hematological: Negative for adenopathy. Does not bruise/bleed easily.  Psychiatric/Behavioral: Negative for dysphoric  mood. The patient is not nervous/anxious.    Objective:  BP (!) 130/82 (BP Location: Left Arm, Patient Position: Sitting, Cuff Size: Normal)    Pulse 72    Temp (!) 97 F (36.1 C) (Temporal)    Ht 5' 1.5" (1.562 m)    Wt (!) 305 lb 6.4 oz (138.5 kg)    SpO2 99%    BMI 56.77 kg/m   Wt Readings from Last 3 Encounters:  11/25/19 (!) 305 lb 6.4 oz (138.5 kg)  11/23/18 (!) 305 lb 8 oz (138.6 kg)  11/29/17 298 lb 12 oz (135.5 kg)      Physical Exam Vitals and nursing note reviewed.  Constitutional:      General: She is not in acute distress.    Appearance: Normal appearance. She is well-developed. She is obese. She is not ill-appearing.  HENT:     Head: Normocephalic and atraumatic.     Right Ear: Hearing, tympanic membrane, ear canal and external ear normal.     Left Ear: Hearing, tympanic membrane, ear canal and external ear normal.  Eyes:     General: No scleral icterus.    Extraocular Movements: Extraocular movements intact.     Conjunctiva/sclera: Conjunctivae normal.     Pupils: Pupils are equal, round, and reactive to light.  Neck:     Thyroid: No thyroid mass, thyromegaly or thyroid tenderness.  Cardiovascular:     Rate and Rhythm: Normal rate and regular rhythm.     Pulses: Normal pulses.          Radial pulses are 2+ on the right side and 2+ on the left side.     Heart sounds: Normal heart sounds. No murmur heard.   Pulmonary:     Effort: Pulmonary effort is normal. No respiratory distress.     Breath sounds: Normal breath sounds. No wheezing, rhonchi or rales.  Abdominal:     General: Abdomen is flat. Bowel sounds are normal. There is no distension.     Palpations: Abdomen is soft. There is no mass.     Tenderness: There is no abdominal tenderness. There is no guarding or rebound.     Hernia: No hernia is present.  Musculoskeletal:        General: Swelling present. Normal range of motion.     Cervical back: Normal range of motion and neck supple.     Right lower leg:  Edema present.     Left lower leg: Edema present.     Comments:  2+ DP bilaterally Nonpitting edema bilateral lower extremities from toes to mid calf with varicosities present  Lymphadenopathy:     Cervical: No cervical adenopathy.  Skin:    General: Skin is warm and dry.     Findings: No rash.  Neurological:     General: No focal deficit present.     Mental Status: She is alert and oriented to person, place, and time.     Comments: CN grossly intact, station and gait intact  Psychiatric:        Mood and Affect: Mood normal.        Behavior: Behavior normal.  Thought Content: Thought content normal.        Judgment: Judgment normal.       Results for orders placed or performed in visit on 11/22/19  VITAMIN D 25 Hydroxy (Vit-D Deficiency, Fractures)  Result Value Ref Range   VITD 38.81 30.00 - 100.00 ng/mL  Hemoglobin A1c  Result Value Ref Range   Hgb A1c MFr Bld 6.1 4.6 - 6.5 %  Comprehensive metabolic panel  Result Value Ref Range   Sodium 141 135 - 145 mEq/L   Potassium 3.8 3.5 - 5.1 mEq/L   Chloride 103 96 - 112 mEq/L   CO2 31 19 - 32 mEq/L   Glucose, Bld 95 70 - 99 mg/dL   BUN 19 6 - 23 mg/dL   Creatinine, Ser 1.05 0.40 - 1.20 mg/dL   Total Bilirubin 0.4 0.2 - 1.2 mg/dL   Alkaline Phosphatase 47 39 - 117 U/L   AST 14 0 - 37 U/L   ALT 13 0 - 35 U/L   Total Protein 7.3 6.0 - 8.3 g/dL   Albumin 4.1 3.5 - 5.2 g/dL   GFR 64.31 >60.00 mL/min   Calcium 9.5 8.4 - 10.5 mg/dL  Lipid panel  Result Value Ref Range   Cholesterol 162 0 - 200 mg/dL   Triglycerides 63.0 0 - 149 mg/dL   HDL 54.00 >39.00 mg/dL   VLDL 12.6 0.0 - 40.0 mg/dL   LDL Cholesterol 96 0 - 99 mg/dL   Total CHOL/HDL Ratio 3    NonHDL 108.16   Microalbumin / creatinine urine ratio  Result Value Ref Range   Microalb, Ur 1.0 0.0 - 1.9 mg/dL   Creatinine,U 146.2 mg/dL   Microalb Creat Ratio 0.7 0.0 - 30.0 mg/g   Lab Results  Component Value Date   VITAMINB12 463 10/14/2015    Lab Results    Component Value Date   TSH 4.460 08/07/2013    Assessment & Plan:  This visit occurred during the SARS-CoV-2 public health emergency.  Safety protocols were in place, including screening questions prior to the visit, additional usage of staff PPE, and extensive cleaning of exam room while observing appropriate contact time as indicated for disinfecting solutions.   Problem List Items Addressed This Visit    Vitamin D deficiency    Reviewed dosing. She is on 2000 IU daily + MVI and joint health supplement. Levels stable on this regimen.       Pedal edema    Concern for developing lymphedema.  Will start treatment with compression stockings. Update if any difficulty tolerating/fitting.  Check TSH.       Morbid obesity with BMI of 50.0-59.9, adult ([place])    Encouraged healthy diet and lifestyle changes to affect sustainable weight loss. Activity limited by osteoarthritis. Discussed non-weight bearing exercise to conserve cartilage. Not interested in bariatric surgery at this time. Will refer to nutritionist.       Relevant Orders   Amb ref to Medical Nutrition Therapy-MNT   HYPERTENSION, BENIGN ESSENTIAL    Chronic, stable. Continue current regimen.       Hyperlipidemia associated with type 2 diabetes mellitus (HCC)    Mild, not on statin. In diabetic, consider adding statin The 10-year ASCVD risk score Mikey Bussing DC Jr., et al., 2013) is: 14.4%   Values used to calculate the score:     Age: 49 years     Sex: Female     Is Non-Hispanic African American: Yes     Diabetic: Yes  Tobacco smoker: No     Systolic Blood Pressure: 474 mmHg     Is BP treated: Yes     HDL Cholesterol: 54 mg/dL     Total Cholesterol: 162 mg/dL       Relevant Orders   Amb ref to [place] maintenance - Primary    Preventative protocols reviewed and updated unless pt declined. Discussed healthy diet and lifestyle.       Glaucoma    Sees eye doctor regularly.        Degenerative arthritis of knee, bilateral    Severe. Discussed non-weight bearing exercise to conserve cartilage.       Controlled diabetes mellitus type 2 with complications (HCC)    Metformin started last year. Consider GLP1 RA for weight loss effect - but caution in GI surgery history.       Relevant Orders   Amb ref to Medical Nutrition Therapy-MNT   Bilateral leg paresthesia    Acutely worse with prolonged sitting on hard surfaces. In metformin use, check vitamin B12 levels today.       Relevant Orders   Folate   Vitamin B12   TSH   T4, free       No orders of the defined types were placed in this encounter.  Orders Placed This Encounter  Procedures   Folate   Vitamin B12   TSH   T4, free   Amb ref to Medical Nutrition Therapy-MNT    Referral Priority:   Routine    Referral Type:   Consultation    Referral Reason:   Specialty Services Required    Requested Specialty:   Nutrition    Number of Visits Requested:   1    Patient instructions: We will refer you to nutritionist.  Labs today to check thyroid and b12 levels  Work on healthy diet choices and non weight bearing exercise to conserve cartilage.  Return in 6 months for diabetes follow up visit.   Follow up plan: Return in about 6 months (around 05/27/2020) for follow up visit.  Ria Bush, MD

## 2019-11-25 NOTE — Assessment & Plan Note (Signed)
Sees eye doctor regularly. 

## 2019-11-25 NOTE — Assessment & Plan Note (Signed)
Chronic, stable. Continue current regimen. 

## 2019-11-25 NOTE — Assessment & Plan Note (Signed)
Metformin started last year. Consider GLP1 RA for weight loss effect - but caution in GI surgery history.

## 2019-11-25 NOTE — Assessment & Plan Note (Signed)
Reviewed dosing. She is on 2000 IU daily + MVI and joint health supplement. Levels stable on this regimen.

## 2019-11-25 NOTE — Assessment & Plan Note (Addendum)
Mild, not on statin. In diabetic, consider adding statin The 10-year ASCVD risk score Mikey Bussing DC Brooke Bonito., et al., 2013) is: 14.4%   Values used to calculate the score:     Age: 64 years     Sex: Female     Is Non-Hispanic African American: Yes     Diabetic: Yes     Tobacco smoker: No     Systolic Blood Pressure: 818 mmHg     Is BP treated: Yes     HDL Cholesterol: 54 mg/dL     Total Cholesterol: 162 mg/dL

## 2019-11-25 NOTE — Assessment & Plan Note (Addendum)
Concern for developing lymphedema.  Will start treatment with compression stockings. Update if any difficulty tolerating/fitting.  Check TSH.

## 2019-11-25 NOTE — Assessment & Plan Note (Signed)
Severe. Discussed non-weight bearing exercise to conserve cartilage.

## 2019-11-25 NOTE — Assessment & Plan Note (Signed)
Encouraged healthy diet and lifestyle changes to affect sustainable weight loss. Activity limited by osteoarthritis. Discussed non-weight bearing exercise to conserve cartilage. Not interested in bariatric surgery at this time. Will refer to nutritionist.

## 2019-11-25 NOTE — Assessment & Plan Note (Signed)
Acutely worse with prolonged sitting on hard surfaces. In metformin use, check vitamin B12 levels today.

## 2019-11-25 NOTE — Assessment & Plan Note (Signed)
Preventative protocols reviewed and updated unless pt declined. Discussed healthy diet and lifestyle.  

## 2019-11-26 LAB — TSH: TSH: 2.09 u[IU]/mL (ref 0.35–4.50)

## 2019-11-26 LAB — T4, FREE: Free T4: 0.99 ng/dL (ref 0.60–1.60)

## 2019-11-26 LAB — VITAMIN B12: Vitamin B-12: 1065 pg/mL — ABNORMAL HIGH (ref 211–911)

## 2019-11-26 LAB — FOLATE: Folate: >24.8 ng/mL (ref >5.9)

## 2019-12-12 ENCOUNTER — Other Ambulatory Visit: Payer: Self-pay

## 2019-12-12 ENCOUNTER — Encounter: Payer: BC Managed Care – PPO | Attending: Family Medicine | Admitting: Dietician

## 2019-12-12 ENCOUNTER — Encounter: Payer: Self-pay | Admitting: Dietician

## 2019-12-12 VITALS — Ht 61.0 in | Wt 304.5 lb

## 2019-12-12 DIAGNOSIS — I10 Essential (primary) hypertension: Secondary | ICD-10-CM

## 2019-12-12 DIAGNOSIS — Z6841 Body Mass Index (BMI) 40.0 and over, adult: Secondary | ICD-10-CM | POA: Diagnosis not present

## 2019-12-12 DIAGNOSIS — R7303 Prediabetes: Secondary | ICD-10-CM | POA: Diagnosis not present

## 2019-12-12 NOTE — Patient Instructions (Signed)
   Work on reducing portions of starchy foods by measuring portions and gradually serving smaller amounts until reaching the goal of 30-45grams carb for each meal.  Have something to eat every 3-5 hours during the day; take a break when working to have a light meal or a balanced snack with protein, carb, and a veg or fruit (or both)

## 2019-12-12 NOTE — Progress Notes (Signed)
Medical Nutrition Therapy: Visit start time: 3646  end time: 1615  Assessment:  Diagnosis: pre-diabetes, obesity Past medical history: HTN  Psychosocial issues/ stress concerns: none  Preferred learning method:  . 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 Now resolved and she has been regaining some of the lost weight.   She needs to lose weight to have knee replacement surgery.  She reports increase in BGs but not to diabetic level yet.  Recent HbA1C of 6.1%; she has started taking Metformin.   She feels her issue is food choices ie proteins and starchy foods especially bread, potatoes, pasta.   She does skip meals often as she becomes focused on her work (works from home) and forgets to stop and eat.  Physical activity: limited due to knee pain  Dietary Intake:  Usual eating pattern includes 1 meals and 2 snacks per day. Dining out frequency: 1-2 meals per week.  Breakfast: 11am [employer]ani low sugar yogurt with trader joes ginger cashew granola; sometimes skips Snack: none Lunch: usually cooks, out 1-2x a week -- thin steak with eggs, biscuit made from mix, inst mashed potatoes with onion. If no lunch or just a snack will have a larger dinner Snack: none Supper: brisket sandwich; rib bones (individual) Snack: grapes, cheese and crackers Beverages: coffee, some water with flavoring, 16oz soda daily  Nutrition Care Education: Topics covered:  Basic nutrition: basic food groups, appropriate nutrient balance, appropriate meal and snack schedule, general nutrition guidelines    Weight control: importance of low sugar and low fat choices, portion control strategies including using small plates, measuring portions; instructed on basic meal planning using plate  method and food lists. Diabetes prevention: appropriate meal and snack schedule, appropriate carb intake and balance, healthy carb choices, role of fiber, protein, fat Hypertension: identifying high sodium foods, identifying food sources of potassium, magnesium   Nutritional Diagnosis:  [place]-2.2 Altered nutrition-related laboratory As related to pre-diabetes.  As evidenced by patient with recent HbA1C of 6.1%. [place]-3.3 Overweight/obesity As related to history of excess calories, inactivity.  As evidenced by patient with current BMI of 57.7.  Intervention:   Instruction and discussion as noted above.  Patient has good understanding of food groups and concept of balanced meals. She voices motivation to make dietary changes.  Established goals for change with direction from patient.   Patient declined scheduling follow-up at this time, she will schedule at a later time as needed.   Education Materials given:  . General diet guidelines for Diabetes . Plate Planner wit food lists, sample meal pattern . Sample menus . Goals/ instructions   Learner/ who was taught:  . Patient   Level of understanding: Marland Kitchen Verbalizes/ demonstrates competency   Demonstrated degree of understanding via:   Teach back Learning barriers: . None  Willingness to learn/ readiness for change: . Acceptance, ready for change   Monitoring and Evaluation:  Dietary intake, exercise, BG control, BP control, and body weight      follow up: prn

## 2019-12-18 LAB — HM DIABETES EYE EXAM

## 2019-12-25 ENCOUNTER — Other Ambulatory Visit: Payer: Self-pay | Admitting: Family Medicine

## 2020-03-10 ENCOUNTER — Telehealth: Payer: Self-pay | Admitting: Family Medicine

## 2020-03-10 NOTE — Telephone Encounter (Signed)
Pt called in wanted to know about getting covid booster and wanted to know about which one to take and she had the J&J one dose in March 2021

## 2020-03-10 NOTE — Telephone Encounter (Signed)
Ok to get covid booster. Would recommend mRNA vaccine (pfizer or moderna).

## 2020-03-11 NOTE — Telephone Encounter (Signed)
Spoke with pt relaying Dr. G's message. Pt verbalizes understanding.  

## 2020-03-12 ENCOUNTER — Ambulatory Visit (INDEPENDENT_AMBULATORY_CARE_PROVIDER_SITE_OTHER): Payer: BC Managed Care – PPO

## 2020-03-12 DIAGNOSIS — Z23 Encounter for immunization: Secondary | ICD-10-CM | POA: Diagnosis not present

## 2020-03-26 ENCOUNTER — Other Ambulatory Visit: Payer: Self-pay | Admitting: Family Medicine

## 2020-04-06 ENCOUNTER — Other Ambulatory Visit: Payer: Self-pay | Admitting: Family Medicine

## 2020-05-27 ENCOUNTER — Other Ambulatory Visit: Payer: Self-pay

## 2020-05-27 ENCOUNTER — Ambulatory Visit: Payer: BC Managed Care – PPO | Admitting: Family Medicine

## 2020-05-27 ENCOUNTER — Encounter: Payer: Self-pay | Admitting: Family Medicine

## 2020-05-27 VITALS — BP 150/82 | HR 77 | Temp 98.0°F | Ht 61.0 in | Wt 293.1 lb

## 2020-05-27 DIAGNOSIS — I10 Essential (primary) hypertension: Secondary | ICD-10-CM | POA: Diagnosis not present

## 2020-05-27 DIAGNOSIS — E118 Type 2 diabetes mellitus with unspecified complications: Secondary | ICD-10-CM | POA: Diagnosis not present

## 2020-05-27 DIAGNOSIS — Z6841 Body Mass Index (BMI) 40.0 and over, adult: Secondary | ICD-10-CM | POA: Diagnosis not present

## 2020-05-27 LAB — POCT GLYCOSYLATED HEMOGLOBIN (HGB A1C): Hemoglobin A1C: 5.9 % — AB (ref 4.0–5.6)

## 2020-05-27 NOTE — Assessment & Plan Note (Signed)
Congratulated on weight loss noted.  

## 2020-05-27 NOTE — Patient Instructions (Addendum)
Ask eye doctor to send [place] report next time you see him. We will requests latest eye exam from [facility] [phone]).  Check with insurance on preferred glucose meter brand (on formulary) and I will send this to your pharmacy. May use this to check as needed or fasting once in a while. Goal fasting readings are 80-120.  Sugars were a bit higher than last time - work on low sugar low carb diet.  Blood pressures were elevated today - start monitoring at home, goal BP <140/90. Let me know if consistently elevated to add on another blood pressure medicine. Work on low sodium in the diet, work on more potassium rich foods.  Return as needed or in 6 months for physical.

## 2020-05-27 NOTE — Assessment & Plan Note (Signed)
Chronic, adequate but deteriorated control.  Continue metformin. rec low sugar low carb diet.  rec she check on insurance formulary for glucometer, rec start checking with goal fasting readings 80-120.

## 2020-05-27 NOTE — Progress Notes (Signed)
Patient ID: Leah Peterson, female    DOB: 12/03/1955, 65 y.o.   MRN: [id number]  This visit was conducted in person.  BP (!) 150/82 (BP Location: Right Arm, Patient Position: Sitting, Cuff Size: Large)   Pulse 77   Temp 98 F (36.7 C) (Temporal)   Ht 5\' 1"  (1.549 m)   Wt 293 lb 2 oz (133 kg)   SpO2 98%   BMI 55.39 kg/m   154/86 on repeat testing using thigh cuff BP Readings from Last 3 Encounters:  05/27/20 (!) 150/82  11/25/19 (!) 130/82  11/23/18 136/88    CC: 6 mo f/u visit  Subjective:   HPI: Leah Peterson is a 65 y.o. female presenting on 05/27/2020 for Diabetes (Here for 6 mo f/u.)   10 lb weight loss over the past 6 months - eating less frequently. She's started using CBD cream to knees with benefit.   HTN - bp elevated despite amlodipine 5mg  daily, hctz 25mg  daily. No HA, vision changes, CP/tightness, SOB, leg swelling.   DM - does not regularly check sugars. Compliant with antihyperglycemic regimen which includes: metformin 500mg  daily. Denies hypoglycemic symptoms. Denies paresthesias. Last diabetic eye exam DUE - upcoming appt next month. Sees eye doctors 3 times a year - has h/o glaucoma endorses h/o retinal tear to L eye. Pneumovax: 07/2013. Prevnar: not due. Glucometer brand: doesn't have. DSME: completed. Lab Results  Component Value Date   HGBA1C 5.9 (A) 05/27/2020   Diabetic Foot Exam - Simple   Simple Foot Form Diabetic Foot exam was performed with the following findings: Yes 05/27/2020 12:34 PM  Visual Inspection No deformities, no ulcerations, no other skin breakdown bilaterally: Yes Sensation Testing Intact to touch and monofilament testing bilaterally: Yes Pulse Check Posterior Tibialis and Dorsalis pulse intact bilaterally: Yes Comments Chronic nonpitting edema, improved    Lab Results  Component Value Date   MICROALBUR 1.0 11/22/2019         Relevant past medical, surgical, family and social history reviewed and updated  as indicated. Interim medical history since our last visit reviewed. Allergies and medications reviewed and updated. Outpatient Medications Prior to Visit  Medication Sig Dispense Refill  . Acetaminophen (TYLENOL ARTHRITIS EXT RELIEF PO) Take by mouth.    Marland Kitchen amLODipine (NORVASC) 5 MG tablet TAKE 1 TABLET BY MOUTH EVERY DAY 90 tablet 1  . cetirizine (ZYRTEC) 10 MG tablet Take 10 mg by mouth at bedtime as needed for allergies.    . Cholecalciferol (VITAMIN D) 2000 units CAPS Take 1 capsule (2,000 Units total) by mouth daily. 30 capsule   . hydrochlorothiazide (HYDRODIURIL) 25 MG tablet TAKE 1 TABLET BY MOUTH EVERY DAY 90 tablet 2  . latanoprost (XALATAN) 0.005 % ophthalmic solution Place 1 drop into both eyes at bedtime.   0  . metFORMIN (GLUCOPHAGE) 500 MG tablet TAKE 1 TABLET BY MOUTH EVERY DAY WITH BREAKFAST 90 tablet 3  . Multiple Vitamin (MULTIVITAMIN WITH MINERALS) TABS tablet Take 1 tablet by mouth daily.    . timolol (TIMOPTIC) 0.5 % ophthalmic solution INSTILL 1 DROP INTO BOTH EYES EVERY DAY    . trolamine salicylate (ASPERCREME) 10 % cream Apply 1 application topically as needed (for knee pain).     Marland Kitchen UNABLE TO FIND Med Name: Joint support condroitin     No facility-administered medications prior to visit.     Per HPI unless specifically indicated in ROS section below Review of Systems Objective:  BP (!) 150/82 (BP Location: Right  Arm, Patient Position: Sitting, Cuff Size: Large)   Pulse 77   Temp 98 F (36.7 C) (Temporal)   Ht 5\' 1"  (1.549 m)   Wt 293 lb 2 oz (133 kg)   SpO2 98%   BMI 55.39 kg/m   Wt Readings from Last 3 Encounters:  05/27/20 293 lb 2 oz (133 kg)  12/12/19 (!) 304 lb 8 oz (138.1 kg)  11/25/19 (!) 305 lb 6.4 oz (138.5 kg)      Physical Exam Vitals and nursing note reviewed.  Constitutional:      General: She is not in acute distress.    Appearance: Normal appearance. She is well-developed and well-nourished. She is obese. She is not ill-appearing.   HENT:     Head: Normocephalic and atraumatic.     Mouth/Throat:     Mouth: Oropharynx is clear and moist.  Eyes:     General: No scleral icterus.    Extraocular Movements: Extraocular movements intact and EOM normal.     Conjunctiva/sclera: Conjunctivae normal.     Pupils: Pupils are equal, round, and reactive to light.  Cardiovascular:     Rate and Rhythm: Normal rate and regular rhythm.     Pulses: Normal pulses and intact distal pulses.     Heart sounds: Normal heart sounds. No murmur heard.   Pulmonary:     Effort: Pulmonary effort is normal. No respiratory distress.     Breath sounds: Normal breath sounds. No wheezing, rhonchi or rales.  Musculoskeletal:        General: Swelling (nonpitting bilateral) present.     Cervical back: Normal range of motion and neck supple.     Right lower leg: Edema present.     Left lower leg: Edema present.     Comments: See HPI for foot exam if done  Lymphadenopathy:     Cervical: No cervical adenopathy.  Skin:    General: Skin is warm and dry.     Findings: No rash.  Neurological:     Mental Status: She is alert.  Psychiatric:        Mood and Affect: Mood and affect and mood normal.       Results for orders placed or performed in visit on 05/27/20  POCT glycosylated hemoglobin (Hb A1C)  Result Value Ref Range   Hemoglobin A1C 5.9 (A) 4.0 - 5.6 %   HbA1c POC (<> result, manual entry)     HbA1c, POC (prediabetic range)     HbA1c, POC (controlled diabetic range)     Assessment & Plan:  This visit occurred during the SARS-CoV-2 public health emergency.  Safety protocols were in place, including screening questions prior to the visit, additional usage of staff PPE, and extensive cleaning of exam room while observing appropriate contact time as indicated for disinfecting solutions.   Problem List Items Addressed This Visit    Morbid obesity with BMI of 50.0-59.9, adult ([place])    Congratulated on weight loss noted.        HYPERTENSION, BENIGN ESSENTIAL    Chronic, above goal. rec start monitoring BP at home, BP log provided. I asked her to drop off completed log to review and/or let me know if consistently >140/90 at home to add 3rd antihypertensive (likely ARB).       Controlled diabetes mellitus type 2 with complications (HCC) - Primary    Chronic, adequate but deteriorated control.  Continue metformin. rec low sugar low carb diet.  rec she check on insurance formulary for  glucometer, rec start checking with goal fasting readings 80-120.       Relevant Orders   POCT glycosylated hemoglobin (Hb A1C) (Completed)       No orders of the defined types were placed in this encounter.  Orders Placed This Encounter  Procedures  . POCT glycosylated hemoglobin (Hb A1C)    Patient Instructions  Ask eye doctor to send [place] report next time you see him. We will requests latest eye exam from [facility] [phone]).  Check with insurance on preferred glucose meter brand (on formulary) and I will send this to your pharmacy. May use this to check as needed or fasting once in a while. Goal fasting readings are 80-120.  Sugars were a bit higher than last time - work on low sugar low carb diet.  Blood pressures were elevated today - start monitoring at home, goal BP <140/90. Let me know if consistently elevated to add on another blood pressure medicine. Work on low sodium in the diet, work on more potassium rich foods.  Return as needed or in 6 months for physical.    Follow up plan: Return in about 6 months (around 11/24/2020), or if symptoms worsen or fail to improve, for annual exam, prior fasting for blood work.  Ria Bush, MD

## 2020-05-27 NOTE — Assessment & Plan Note (Signed)
Chronic, above goal. rec start monitoring BP at home, BP log provided. I asked her to drop off completed log to review and/or let me know if consistently >140/90 at home to add 3rd antihypertensive (likely ARB).

## 2020-05-29 ENCOUNTER — Encounter: Payer: Self-pay | Admitting: Family Medicine

## 2020-05-29 DIAGNOSIS — H33103 Unspecified retinoschisis, bilateral: Secondary | ICD-10-CM | POA: Insufficient documentation

## 2020-06-19 LAB — HM DIABETES EYE EXAM

## 2020-09-15 ENCOUNTER — Other Ambulatory Visit: Payer: Self-pay | Admitting: Family Medicine

## 2020-09-15 ENCOUNTER — Encounter: Payer: Self-pay | Admitting: Family Medicine

## 2020-09-15 DIAGNOSIS — Z1231 Encounter for screening mammogram for malignant neoplasm of breast: Secondary | ICD-10-CM

## 2020-11-02 ENCOUNTER — Other Ambulatory Visit: Payer: Self-pay | Admitting: Family Medicine

## 2020-11-15 ENCOUNTER — Other Ambulatory Visit: Payer: Self-pay | Admitting: Family Medicine

## 2020-11-15 DIAGNOSIS — E1169 Type 2 diabetes mellitus with other specified complication: Secondary | ICD-10-CM

## 2020-11-15 DIAGNOSIS — E559 Vitamin D deficiency, unspecified: Secondary | ICD-10-CM

## 2020-11-15 DIAGNOSIS — E118 Type 2 diabetes mellitus with unspecified complications: Secondary | ICD-10-CM

## 2020-11-17 ENCOUNTER — Inpatient Hospital Stay: Admission: RE | Admit: 2020-11-17 | Payer: BC Managed Care – PPO | Source: Ambulatory Visit

## 2020-11-18 ENCOUNTER — Other Ambulatory Visit (INDEPENDENT_AMBULATORY_CARE_PROVIDER_SITE_OTHER): Payer: BC Managed Care – PPO

## 2020-11-18 ENCOUNTER — Other Ambulatory Visit: Payer: Self-pay

## 2020-11-18 ENCOUNTER — Ambulatory Visit
Admission: RE | Admit: 2020-11-18 | Discharge: 2020-11-18 | Disposition: A | Discharge status: Home / Self Care | Payer: BC Managed Care – PPO | Source: Ambulatory Visit | Attending: Family Medicine | Admitting: Family Medicine

## 2020-11-18 DIAGNOSIS — E118 Type 2 diabetes mellitus with unspecified complications: Secondary | ICD-10-CM | POA: Diagnosis not present

## 2020-11-18 DIAGNOSIS — E559 Vitamin D deficiency, unspecified: Secondary | ICD-10-CM | POA: Diagnosis not present

## 2020-11-18 DIAGNOSIS — E785 Hyperlipidemia, unspecified: Secondary | ICD-10-CM

## 2020-11-18 DIAGNOSIS — Z1231 Encounter for screening mammogram for malignant neoplasm of breast: Secondary | ICD-10-CM

## 2020-11-18 DIAGNOSIS — E1169 Type 2 diabetes mellitus with other specified complication: Secondary | ICD-10-CM | POA: Diagnosis not present

## 2020-11-18 LAB — COMPREHENSIVE METABOLIC PANEL
ALT: 14 U/L (ref 0–35)
AST: 17 U/L (ref 0–37)
Albumin: 4.3 g/dL (ref 3.5–5.2)
Alkaline Phosphatase: 51 U/L (ref 39–117)
BUN: 24 mg/dL — ABNORMAL HIGH (ref 6–23)
CO2: 27 mEq/L (ref 19–32)
Calcium: 9.8 mg/dL (ref 8.4–10.5)
Chloride: 103 mEq/L (ref 96–112)
Creatinine, Ser: 1.02 mg/dL (ref 0.40–1.20)
GFR: 58.86 mL/min — ABNORMAL LOW (ref >60.00)
Glucose, Bld: 96 mg/dL (ref 70–99)
Potassium: 4.1 mEq/L (ref 3.5–5.1)
Sodium: 142 mEq/L (ref 135–145)
Total Bilirubin: 0.6 mg/dL (ref 0.2–1.2)
Total Protein: 7.6 g/dL (ref 6.0–8.3)

## 2020-11-18 LAB — MICROALBUMIN / CREATININE URINE RATIO
Creatinine,U: 134 mg/dL
Microalb Creat Ratio: 0.8 mg/g (ref 0.0–30.0)
Microalb, Ur: 1.1 mg/dL (ref 0.0–1.9)

## 2020-11-18 LAB — HEMOGLOBIN A1C: Hgb A1c MFr Bld: 6 % (ref 4.6–6.5)

## 2020-11-18 LAB — LIPID PANEL
Cholesterol: 198 mg/dL (ref 0–200)
HDL: 67.7 mg/dL (ref >39.00)
LDL Cholesterol: 119 mg/dL — ABNORMAL HIGH (ref 0–99)
NonHDL: 130.39
Total CHOL/HDL Ratio: 3
Triglycerides: 58 mg/dL (ref 0.0–149.0)
VLDL: 11.6 mg/dL (ref 0.0–40.0)

## 2020-11-18 LAB — VITAMIN D 25 HYDROXY (VIT D DEFICIENCY, FRACTURES): VITD: 44.25 ng/mL (ref 30.00–100.00)

## 2020-11-25 ENCOUNTER — Other Ambulatory Visit: Payer: Self-pay

## 2020-11-25 ENCOUNTER — Encounter: Payer: Self-pay | Admitting: Family Medicine

## 2020-11-25 ENCOUNTER — Ambulatory Visit (INDEPENDENT_AMBULATORY_CARE_PROVIDER_SITE_OTHER): Payer: BC Managed Care – PPO | Admitting: Family Medicine

## 2020-11-25 VITALS — BP 150/90 | HR 82 | Temp 97.5°F | Ht 61.0 in | Wt 302.0 lb

## 2020-11-25 DIAGNOSIS — M170 Bilateral primary osteoarthritis of knee: Secondary | ICD-10-CM

## 2020-11-25 DIAGNOSIS — E118 Type 2 diabetes mellitus with unspecified complications: Secondary | ICD-10-CM

## 2020-11-25 DIAGNOSIS — E559 Vitamin D deficiency, unspecified: Secondary | ICD-10-CM

## 2020-11-25 DIAGNOSIS — Z0001 Encounter for general adult medical examination with abnormal findings: Secondary | ICD-10-CM

## 2020-11-25 DIAGNOSIS — E1169 Type 2 diabetes mellitus with other specified complication: Secondary | ICD-10-CM

## 2020-11-25 DIAGNOSIS — Z6841 Body Mass Index (BMI) 40.0 and over, adult: Secondary | ICD-10-CM

## 2020-11-25 DIAGNOSIS — I10 Essential (primary) hypertension: Secondary | ICD-10-CM

## 2020-11-25 DIAGNOSIS — Z86718 Personal history of other venous thrombosis and embolism: Secondary | ICD-10-CM | POA: Diagnosis not present

## 2020-11-25 DIAGNOSIS — E785 Hyperlipidemia, unspecified: Secondary | ICD-10-CM

## 2020-11-25 DIAGNOSIS — I890 Lymphedema, not elsewhere classified: Secondary | ICD-10-CM

## 2020-11-25 MED ORDER — LOSARTAN POTASSIUM-HCTZ 50-12.5 MG PO TABS: 1.0000 | ORAL_TABLET | Freq: Every day | ORAL | 3 refills | Status: DC

## 2020-11-25 MED ORDER — AMLODIPINE BESYLATE 5 MG PO TABS: 5.0000 mg | ORAL_TABLET | Freq: Every day | ORAL | 3 refills | Status: DC

## 2020-11-25 NOTE — Assessment & Plan Note (Signed)
Wants to lower BMI to qualify for knee replacement given severe OA. Planning to see [facility] to review options

## 2020-11-25 NOTE — Assessment & Plan Note (Signed)
Mild off statin. Encouraged diet choices to improve LDL. Given elevated ASCVD risk ,consider statin.  The 10-year ASCVD risk score Leah Peterson Leah Peterson., et al., 2013) is: 23.5%   Values used to calculate the score:     Age: 65 years     Sex: Female     Is Non-Hispanic African American: Yes     Diabetic: Yes     Tobacco smoker: No     Systolic Blood Pressure: Q000111Q mmHg     Is BP treated: Yes     HDL Cholesterol: 67.7 mg/dL     Total Cholesterol: 198 mg/dL

## 2020-11-25 NOTE — Progress Notes (Signed)
Patient ID: Leah Peterson, female    DOB: 13-Jan-1956, 65 y.o.   MRN: [id number]  This visit was conducted in person.  BP (!) 150/90   Pulse 82   Temp (!) 97.5 F (36.4 C) (Temporal)   Ht '5\' 1"'$  (1.549 m)   Wt (!) 302 lb (137 kg)   SpO2 97%   BMI 57.06 kg/m    CC: CPE Subjective:   HPI: Leah Peterson is a 65 y.o. female presenting on 11/25/2020 for Annual Exam (Wants to discuss metformin.  Also, wants to discuss skin discoloration. )    HTN - bp remaining elevated despite amlodipine '5mg'$ , hctz '25mg'$  daily. Has not been checking at home.  Prediabetes/DM - longterm managed on metformin '500mg'$  daily. Doesn't feel this has been effective. Would like to come off due to some GI upset. Doesn't have glucometer. Did complete DSME.   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 Has been recommended bariatric surgery prior to L knee replacement for known knee osteoarthritis. Going to see [facility].    Ongoing leg paresthesias with prolonged sitting.  Chronic pedal edema.   Preventative: COLONOSCOPY Date: 06/30/2015 diverticulosis, rpt 10 yrs Leah Mayer, MD) [facility]  Pap smear - always normal, last done 10/2018 - normal but absent ECC zone - consider rpt 2-3 yrs. No pelvic pain or vaginal bleeding.  LMP 10/2007. No post menopausal sxs.   Lung cancer screening - not eligible  Flu shot yearly J&J vaccine 07/2019, Moderna booster 03/2020  Tetanus 2010. Tdap 10/2018 Pneumovax 07/2013  shingrix - 12/2018, 03/2019  Seat belt use discussed  Sunscreen use discussed. No suspicious moles on skin.  Ex smoker - quit remotely  Alcohol - seldom  Dentist - Q6 mo  Eye exam - Q6 mo   Caffeine: hot tea in the morning  Lives with daughter and grand daughter. No pets.   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  Diet: good water, fruits/vegetables daily     Relevant past medical, surgical, family and social history reviewed and updated as indicated. Interim medical history since our last visit reviewed. Allergies and medications reviewed and updated. Outpatient Medications Prior to Visit  Medication Sig Dispense Refill   Acetaminophen (TYLENOL ARTHRITIS EXT RELIEF PO) Take by mouth.     cetirizine (ZYRTEC) 10 MG tablet Take 10 mg by mouth at bedtime as needed for allergies.     Cholecalciferol (VITAMIN D) 2000 units CAPS Take 1 capsule (2,000 Units total) by mouth daily. 30 capsule    latanoprost (XALATAN) 0.005 % ophthalmic solution Place 1 drop into both eyes at bedtime.   0   Multiple Vitamin (MULTIVITAMIN WITH MINERALS) TABS tablet Take 1 tablet by mouth daily.     timolol (TIMOPTIC) 0.5 % ophthalmic solution INSTILL 1 DROP INTO BOTH EYES EVERY DAY     trolamine salicylate (ASPERCREME) 10 % cream Apply 1 application topically as needed (for knee pain).      UNABLE TO FIND Med Name: Joint support condroitin     amLODipine (NORVASC) 5 MG tablet TAKE 1 TABLET BY MOUTH EVERY DAY 90 tablet 0   hydrochlorothiazide (HYDRODIURIL) 25 MG tablet TAKE 1 TABLET BY MOUTH EVERY DAY 90 tablet 2   metFORMIN (GLUCOPHAGE) 500 MG tablet TAKE 1 TABLET BY MOUTH EVERY DAY WITH BREAKFAST 90 tablet  3   No facility-administered medications prior to visit.     Per HPI unless specifically indicated in ROS section below Review of Systems  Constitutional:  Negative for activity change, appetite change, chills, fatigue, fever and unexpected weight change.  HENT:  Negative for hearing loss.   Eyes:  Negative for visual disturbance.  Respiratory:  Negative for cough, chest tightness, shortness of breath and wheezing.   Cardiovascular:  Positive for leg swelling (chronic). Negative for chest pain and palpitations.  Gastrointestinal:  Negative for abdominal distention, abdominal pain, blood in stool,  constipation, diarrhea, nausea and vomiting.  Genitourinary:  Negative for difficulty urinating and hematuria.  Musculoskeletal:  Negative for arthralgias, myalgias and neck pain.  Skin:  Negative for rash.  Neurological:  Negative for dizziness, seizures, syncope and headaches.  Hematological:  Negative for adenopathy. Does not bruise/bleed easily.  Psychiatric/Behavioral:  Negative for dysphoric mood. The patient is not nervous/anxious.    Objective:  BP (!) 150/90   Pulse 82   Temp (!) 97.5 F (36.4 C) (Temporal)   Ht '5\' 1"'$  (1.549 m)   Wt (!) 302 lb (137 kg)   SpO2 97%   BMI 57.06 kg/m   Wt Readings from Last 3 Encounters:  11/25/20 (!) 302 lb (137 kg)  05/27/20 293 lb 2 oz (133 kg)  12/12/19 (!) 304 lb 8 oz (138.1 kg)      Physical Exam Vitals and nursing note reviewed.  Constitutional:      Appearance: Normal appearance. She is obese. She is not ill-appearing.     Comments: Needs assistance to get on exam table due to L knee pain  HENT:     Head: Normocephalic and atraumatic.     Right Ear: Tympanic membrane, ear canal and external ear normal. There is no impacted cerumen.     Left Ear: Tympanic membrane, ear canal and external ear normal. There is no impacted cerumen.  Eyes:     General:        Right eye: No discharge.        Left eye: No discharge.     Extraocular Movements: Extraocular movements intact.     Conjunctiva/sclera: Conjunctivae normal.     Pupils: Pupils are equal, round, and reactive to light.  Neck:     Thyroid: No thyroid mass or thyromegaly.  Cardiovascular:     Rate and Rhythm: Normal rate and regular rhythm.     Pulses: Normal pulses.     Heart sounds: Normal heart sounds. No murmur heard. Pulmonary:     Effort: Pulmonary effort is normal. No respiratory distress.     Breath sounds: Normal breath sounds. No wheezing, rhonchi or rales.  Abdominal:     General: Bowel sounds are normal. There is no distension.     Palpations: Abdomen is soft.  There is no mass.     Tenderness: There is no abdominal tenderness. There is no guarding or rebound.     Hernia: No hernia is present.  Musculoskeletal:        General: Swelling present.     Cervical back: Normal range of motion and neck supple. No rigidity.     Right lower leg: No edema.     Left lower leg: No edema.     Comments:  1+ DP bilaterally Stemmer sign  Lymphadenopathy:     Cervical: No cervical adenopathy.  Skin:    General: Skin is warm and dry.     Findings: No rash.  Neurological:  General: No focal deficit present.     Mental Status: She is alert. Mental status is at baseline.  Psychiatric:        Mood and Affect: Mood normal.        Behavior: Behavior normal.      Results for orders placed or performed in visit on 11/18/20  VITAMIN D 25 Hydroxy (Vit-D Deficiency, Fractures)  Result Value Ref Range   VITD 44.25 30.00 - 100.00 ng/mL  Microalbumin / creatinine urine ratio  Result Value Ref Range   Microalb, Ur 1.1 0.0 - 1.9 mg/dL   Creatinine,U 134.0 mg/dL   Microalb Creat Ratio 0.8 0.0 - 30.0 mg/g  Hemoglobin A1c  Result Value Ref Range   Hgb A1c MFr Bld 6.0 4.6 - 6.5 %  Comprehensive metabolic panel  Result Value Ref Range   Sodium 142 135 - 145 mEq/L   Potassium 4.1 3.5 - 5.1 mEq/L   Chloride 103 96 - 112 mEq/L   CO2 27 19 - 32 mEq/L   Glucose, Bld 96 70 - 99 mg/dL   BUN 24 (H) 6 - 23 mg/dL   Creatinine, Ser 1.02 0.40 - 1.20 mg/dL   Total Bilirubin 0.6 0.2 - 1.2 mg/dL   Alkaline Phosphatase 51 39 - 117 U/L   AST 17 0 - 37 U/L   ALT 14 0 - 35 U/L   Total Protein 7.6 6.0 - 8.3 g/dL   Albumin 4.3 3.5 - 5.2 g/dL   GFR 58.86 (L) >60.00 mL/min   Calcium 9.8 8.4 - 10.5 mg/dL  Lipid panel  Result Value Ref Range   Cholesterol 198 0 - 200 mg/dL   Triglycerides 58.0 0.0 - 149.0 mg/dL   HDL 67.70 >39.00 mg/dL   VLDL 11.6 0.0 - 40.0 mg/dL   LDL Cholesterol 119 (H) 0 - 99 mg/dL   Total CHOL/HDL Ratio 3    NonHDL 130.39     Assessment & Plan:   This visit occurred during the SARS-CoV-2 public health emergency.  Safety protocols were in place, including screening questions prior to the visit, additional usage of staff PPE, and extensive cleaning of exam room while observing appropriate contact time as indicated for disinfecting solutions.   Problem List Items Addressed This Visit     Vitamin D deficiency    Continue replacement with 2000 IU daily.       Hyperlipidemia associated with type 2 diabetes mellitus (HCC)    Mild off statin. Encouraged diet choices to improve LDL. Given elevated ASCVD risk ,consider statin.  The 10-year ASCVD risk score Mikey Bussing DC Brooke Bonito., et al., 2013) is: 23.5%   Values used to calculate the score:     Age: 20 years     Sex: Female     Is Non-Hispanic African American: Yes     Diabetic: Yes     Tobacco smoker: No     Systolic Blood Pressure: Q000111Q mmHg     Is BP treated: Yes     HDL Cholesterol: 67.7 mg/dL     Total Cholesterol: 198 mg/dL        Relevant Medications   losartan-hydrochlorothiazide (HYZAAR) 50-12.5 MG tablet   Morbid obesity with BMI of 50.0-59.9, adult (HCC)    Wants to lower BMI to qualify for knee replacement given severe OA. Planning to see [facility] to review options       HYPERTENSION, BENIGN ESSENTIAL    Chronically above goal.  Will continue amlodipine '5mg'$  daily, change to losartan hctz 50/12.'5mg'$ . I asked  her to start monitoring blood pressures at home, let me know if consistently >140/90 for further titration.        Relevant Medications   amLODipine (NORVASC) 5 MG tablet   losartan-hydrochlorothiazide (HYZAAR) 50-12.5 MG tablet   Degenerative arthritis of knee, bilateral - Primary    L>R - wants to have surgery but needs to lower BMI - see below.        Controlled diabetes mellitus type 2 with complications (HCC)    Only A1c >6.5% was back 8 yrs ago.  Consistently prediabetic since.  Desires to come off metformin (started 2021) given possible GI upset on med. Will  stop.  Encouraged continued low carb/low sugar diet.        Relevant Medications   losartan-hydrochlorothiazide (HYZAAR) 50-12.5 MG tablet   Encounter for general adult medical examination with abnormal findings    Preventative protocols reviewed and updated unless pt declined. Discussed healthy diet and lifestyle.        History of DVT of lower extremity   Lymphedema    H/o LE DVT. H/o varicose veins.  Evidence of lymphedema with + stemmer sign.  Discussed with patient. Could consider VVS eval and/or lymphedema OT - will focus on bariatric evaluation first.          Meds ordered this encounter  Medications   amLODipine (NORVASC) 5 MG tablet    Sig: Take 1 tablet (5 mg total) by mouth daily.    Dispense:  90 tablet    Refill:  3   losartan-hydrochlorothiazide (HYZAAR) 50-12.5 MG tablet    Sig: Take 1 tablet by mouth daily.    Dispense:  90 tablet    Refill:  3   No orders of the defined types were placed in this encounter.    Patient instructions: Ok to stay off metformin. Stop hydrochlorothiazide '25mg'$ . In its place start losartan/hydrochlorothiazide 50/12.'5mg'$  daily. Continue amlodipine '5mg'$  daily.  Start monitoring blood pressures at home - if staying high let me know.  Good to see you today. Return as needed or in 6 months for follow up visit.   Follow up plan: Return in about 6 months (around 05/28/2021), or if symptoms worsen or fail to improve, for follow up visit.  Ria Bush, MD

## 2020-11-25 NOTE — Assessment & Plan Note (Signed)
Preventative protocols reviewed and updated unless pt declined. Discussed healthy diet and lifestyle.  

## 2020-11-25 NOTE — Assessment & Plan Note (Signed)
H/o LE DVT. H/o varicose veins.  Evidence of lymphedema with + stemmer sign.  Discussed with patient. Could consider VVS eval and/or lymphedema OT - will focus on bariatric evaluation first.

## 2020-11-25 NOTE — Assessment & Plan Note (Signed)
Only A1c >6.5% was back 8 yrs ago.  Consistently prediabetic since.  Desires to come off metformin (started 2021) given possible GI upset on med. Will stop.  Encouraged continued low carb/low sugar diet.

## 2020-11-25 NOTE — Assessment & Plan Note (Signed)
Chronically above goal.  Will continue amlodipine '5mg'$  daily, change to losartan hctz 50/12.'5mg'$ . I asked her to start monitoring blood pressures at home, let me know if consistently >140/90 for further titration.

## 2020-11-25 NOTE — Assessment & Plan Note (Signed)
Continue replacement with 2000 IU daily.

## 2020-11-25 NOTE — Patient Instructions (Addendum)
Ok to stay off metformin. Stop hydrochlorothiazide '25mg'$ . In its place start losartan/hydrochlorothiazide 50/12.'5mg'$  daily. Continue amlodipine '5mg'$  daily.  Start monitoring blood pressures at home - if staying high let me know.  Good to see you today. Return as needed or in 6 months for follow up visit.   Health Maintenance for Postmenopausal Women Menopause is a normal process in which your ability to get pregnant comes to an end. This process happens slowly over many months or years, usually between the ages of 68 and 45. Menopause is complete when you have missed your menstrualperiods for 12 months. It is important to talk with your health care provider about some of the most common conditions that affect women after menopause (postmenopausal women). These include heart disease, cancer, and bone loss (osteoporosis). Adopting a healthy lifestyle and getting preventive care can help to promote your health and wellness. The actions you take can also lower your chances ofdeveloping some of these common conditions. What should I know about menopause? During menopause, you may get a number of symptoms, such as: Hot flashes. These can be moderate or severe. Night sweats. Decrease in sex drive. Mood swings. Headaches. Tiredness. Irritability. Memory problems. Insomnia. Choosing to treat or not to treat these symptoms is a decision that you makewith your health care provider. Do I need hormone replacement therapy? Hormone replacement therapy is effective in treating symptoms that are caused by menopause, such as hot flashes and night sweats. Hormone replacement carries certain risks, especially as you become older. If you are thinking about using estrogen or estrogen with progestin, discuss the benefits and risks with your health care provider. What is my risk for heart disease and stroke? The risk of heart disease, heart attack, and stroke increases as you age. One of the causes may be a change in  the body's hormones during menopause. This can affect how your body uses dietary fats, triglycerides, and cholesterol. Heart attack and stroke are medical emergencies. There are many things that you cando to help prevent heart disease and stroke. Watch your blood pressure High blood pressure causes heart disease and increases the risk of stroke. This is more likely to develop in people who have high blood pressure readings, are of African descent, or are overweight. Have your blood pressure checked: Every 3-5 years if you are 33-97 years of age. Every year if you are 75 years old or older. Eat a healthy diet  Eat a diet that includes plenty of vegetables, fruits, low-fat dairy products, and lean protein. Do not eat a lot of foods that are high in solid fats, added sugars, or sodium.  Get regular exercise Get regular exercise. This is one of the most important things you can do for your health. Most adults should: Try to exercise for at least 150 minutes each week. The exercise should increase your heart rate and make you sweat (moderate-intensity exercise). Try to do strengthening exercises at least twice each week. Do these in addition to the moderate-intensity exercise. Spend less time sitting. Even light physical activity can be beneficial. Other tips Work with your health care provider to achieve or maintain a healthy weight. Do not use any products that contain nicotine or tobacco, such as cigarettes, e-cigarettes, and chewing tobacco. If you need help quitting, ask your health care provider. Know your numbers. Ask your health care provider to check your cholesterol and your blood sugar (glucose). Continue to have your blood tested as directed by your health care provider. Do I  need screening for cancer? Depending on your health history and family history, you may need to have cancer screening at different stages of your life. This may include screening for: Breast cancer. Cervical  cancer. Lung cancer. Colorectal cancer. What is my risk for osteoporosis? After menopause, you may be at increased risk for osteoporosis. Osteoporosis is a condition in which bone destruction happens more quickly than new bone creation. To help prevent osteoporosis or the bone fractures that can happen because of osteoporosis, you may take the following actions: If you are 42-54 years old, get at least 1,000 mg of calcium and at least 600 mg of vitamin D per day. If you are older than age 56 but younger than age 70, get at least 1,200 mg of calcium and at least 600 mg of vitamin D per day. If you are older than age 69, get at least 1,200 mg of calcium and at least 800 mg of vitamin D per day. Smoking and drinking excessive alcohol increase the risk of osteoporosis. Eat foods that are rich in calcium and vitamin D, and do weight-bearing exercisesseveral times each week as directed by your health care provider. How does menopause affect my mental health? Depression may occur at any age, but it is more common as you become older. Common symptoms of depression include: Low or sad mood. Changes in sleep patterns. Changes in appetite or eating patterns. Feeling an overall lack of motivation or enjoyment of activities that you previously enjoyed. Frequent crying spells. Talk with your health care provider if you think that you are experiencingdepression. General instructions See your health care provider for regular wellness exams and vaccines. This may include: Scheduling regular health, dental, and eye exams. Getting and maintaining your vaccines. These include: Influenza vaccine. Get this vaccine each year before the flu season begins. Pneumonia vaccine. Shingles vaccine. Tetanus, diphtheria, and pertussis (Tdap) booster vaccine. Your health care provider may also recommend other immunizations. Tell your health care provider if you have ever been abused or do not feel safeat  home. Summary Menopause is a normal process in which your ability to get pregnant comes to an end. This condition causes hot flashes, night sweats, decreased interest in sex, mood swings, headaches, or lack of sleep. Treatment for this condition may include hormone replacement therapy. Take actions to keep yourself healthy, including exercising regularly, eating a healthy diet, watching your weight, and checking your blood pressure and blood sugar levels. Get screened for cancer and depression. Make sure that you are up to date with all your vaccines. This information is not intended to replace advice given to you by your health care provider. Make sure you discuss any questions you have with your healthcare provider. Document Revised: 04/11/2018 Document Reviewed: 04/11/2018 Elsevier Patient Education  2022 [employer]. Lymphedema  Lymphedema is swelling that is caused by the abnormal collection of lymph in the tissues under the skin. Lymph is excess fluid from the tissues in your body that is removed through the lymphatic system. This system is part of your body's defense system (immune system) and includes lymph nodes and lymph vessels. The lymph vessels collect and carry the excess fluid, fats, proteins, and waste from the tissues of the body to the bloodstream. This system also works to clean and remove bacteria andwaste products from the body. Lymphedema occurs when the lymphatic system is blocked. When the lymph vessels or lymph nodes are blocked or damaged, lymph does not drain properly. This causes an abnormal buildup of  lymph, which leads to swelling in the affected area. This may include the trunk area, or an arm or leg. Lymphedema cannot becured by medicines, but various methods can be used to help reduce the swelling. What are the causes? The cause of this condition depends on the type of lymphedema that you have. Primary lymphedema is caused by the absence of lymph vessels or having  abnormal lymph vessels at birth. Secondary lymphedema occurs when lymph vessels are blocked or damaged. Secondary lymphedema is more common. Common causes of lymph vessel blockage include: Skin infection, such as cellulitis. Infection by parasites (filariasis). Injury. Radiation therapy. Cancer. Formation of scar tissue. Surgery. What are the signs or symptoms? Symptoms of this condition include: Swelling of the arm or leg. A heavy or tight feeling in the arm or leg. Swelling of the feet, toes, or fingers. Shoes or rings may fit more tightly than before. Redness of the skin over the affected area. Limited movement of the affected limb. Sensitivity to touch or discomfort in the affected limb. How is this diagnosed? This condition may be diagnosed based on: Your symptoms and medical history. A physical exam. Bioimpedance spectroscopy. In this test, painless electrical currents are used to measure fluid levels in your body. Imaging tests, such as: MRI. CT scan. Duplex ultrasound. This test uses sound waves to produce images of the vessels and the blood flow on a screen. Lymphoscintigraphy. In this test, a low dose of a radioactive substance is injected to trace the flow of lymph through your lymph vessels. Lymphangiography. In this test, a contrast dye is injected into the lymph vessel to help show blockages. How is this treated?  If an underlying condition is causing the lymphedema, that condition will betreated. For example, antibiotic medicines may be used to treat an infection. Treatment for this condition will depend on the cause of your lymphedema. Treatment may include: Complete decongestive therapy (CDT). This is done by a certified lymphedema therapist to reduce fluid congestion. This therapy includes: Skin care. Compression wrapping of the affected area. Manual lymph drainage. This is a special massage technique that promotes lymph drainage out of a limb. Specific  exercises. Certain exercises can help fluid move out of the affected limb. Compression. Various methods may be used to apply pressure to the affected limb to reduce the swelling. They include: Wearing compression stockings or sleeves on the affected limb. Wrapping the affected limb with special bandages. Surgery. This is usually done for severe cases only. For example, surgery may be done if you have trouble moving the limb or if the swelling does not get better with other treatments. Follow these instructions at home: Self-care The affected area is more likely to become injured or infected. Take these steps to help prevent infection: Keep the affected area clean and dry. Use approved creams or lotions to keep the skin moisturized. Protect your skin from cuts: Use gloves while cooking or gardening. Do not walk barefoot. If you shave the affected area, use an Copy. Do not wear tight clothes, shoes, or jewelry. Eat a healthy diet that includes a lot of fruits and vegetables. Activity Do exercises as told by your health care provider. Do not sit with your legs crossed. When possible, keep the affected limb raised (elevated) above the level of your heart. Avoid carrying things with an arm that is affected by lymphedema. General instructions Wear compression stockings or sleeves as told by your health care provider. Note any changes in size of the affected  limb. You may be instructed to take regular measurements and keep track of them. Take over-the-counter and prescription medicines only as told by your health care provider. If you were prescribed an antibiotic medicine, take or apply it as told by your health care provider. Do not stop using the antibiotic even if you start to feel better or if your condition improves. Do not use heating pads or ice packs on the affected area. Avoid having blood draws, IV insertions, or blood pressure checks on the affected limb. Keep all follow-up  visits. This is important. Contact a health care provider if you: Continue to have swelling in your limb. Have fluid leaking from the skin of your swollen limb. Have a cut that does not heal. Have redness or pain in the affected area. Develop purplish spots, rash, blisters, or sores (lesions) on your affected limb. Get help right away if you: Have new swelling in your limb that starts suddenly. Have shortness of breath or chest pain. Have a fever or chills. These symptoms may represent a serious problem that is an emergency. Do not wait to see if the symptoms will go away. Get medical help right away. Call your local emergency services (911 in the [place] Do not drive yourself to the hospital. Summary Lymphedema is swelling that is caused by the abnormal collection of lymph in the tissues under the skin. Lymph is fluid from the tissues in your body that is removed through the lymphatic system. This system collects and carries excess fluid, fats, proteins, and wastes from the tissues of the body to the bloodstream. Lymphedema causes swelling, pain, and redness in the affected area. This may include the trunk area, or an arm or leg. Treatment for this condition may depend on the cause of your lymphedema. Treatment may include treating the underlying cause, complete decongestive therapy (CDT), compression methods, or surgery. This information is not intended to replace advice given to you by your health care provider. Make sure you discuss any questions you have with your healthcare provider. Document Revised: 02/12/2020 Document Reviewed: 02/12/2020 Elsevier Patient Education  2022 [employer].

## 2020-11-25 NOTE — Assessment & Plan Note (Signed)
L>R - wants to have surgery but needs to lower BMI - see below.

## 2021-01-30 ENCOUNTER — Other Ambulatory Visit: Payer: Self-pay | Admitting: Family Medicine

## 2021-05-28 ENCOUNTER — Ambulatory Visit (INDEPENDENT_AMBULATORY_CARE_PROVIDER_SITE_OTHER): Payer: BC Managed Care – PPO | Admitting: Family Medicine

## 2021-05-28 ENCOUNTER — Ambulatory Visit (INDEPENDENT_AMBULATORY_CARE_PROVIDER_SITE_OTHER)
Admission: RE | Admit: 2021-05-28 | Discharge: 2021-05-28 | Disposition: A | Discharge status: Home / Self Care | Payer: BC Managed Care – PPO | Source: Ambulatory Visit | Attending: Family Medicine | Admitting: Family Medicine

## 2021-05-28 ENCOUNTER — Encounter: Payer: Self-pay | Admitting: Family Medicine

## 2021-05-28 ENCOUNTER — Other Ambulatory Visit: Payer: Self-pay

## 2021-05-28 VITALS — BP 134/82 | HR 69 | Temp 97.5°F | Ht 61.0 in | Wt 290.5 lb

## 2021-05-28 DIAGNOSIS — I10 Essential (primary) hypertension: Secondary | ICD-10-CM

## 2021-05-28 DIAGNOSIS — E118 Type 2 diabetes mellitus with unspecified complications: Secondary | ICD-10-CM

## 2021-05-28 DIAGNOSIS — M25552 Pain in left hip: Secondary | ICD-10-CM

## 2021-05-28 DIAGNOSIS — Z23 Encounter for immunization: Secondary | ICD-10-CM

## 2021-05-28 DIAGNOSIS — Z6841 Body Mass Index (BMI) 40.0 and over, adult: Secondary | ICD-10-CM

## 2021-05-28 DIAGNOSIS — I872 Venous insufficiency (chronic) (peripheral): Secondary | ICD-10-CM

## 2021-05-28 DIAGNOSIS — I890 Lymphedema, not elsewhere classified: Secondary | ICD-10-CM

## 2021-05-28 DIAGNOSIS — M160 Bilateral primary osteoarthritis of hip: Secondary | ICD-10-CM | POA: Insufficient documentation

## 2021-05-28 DIAGNOSIS — M170 Bilateral primary osteoarthritis of knee: Secondary | ICD-10-CM

## 2021-05-28 LAB — POCT GLYCOSYLATED HEMOGLOBIN (HGB A1C): Hemoglobin A1C: 5.8 % — AB (ref 4.0–5.6)

## 2021-05-28 NOTE — Assessment & Plan Note (Addendum)
Congratulated on 10 lb weight loss with healthy diet choices. Activity limited by leg joint pains.  She wants to avoid bariatric surgery and wants to try through diet /lifestyle changes first.  Discussed possible GLP1RA use (ozempic vs trulicity) to help with weight loss. She will schedule f/u office visit if desires to try these.

## 2021-05-28 NOTE — Assessment & Plan Note (Signed)
Saw sports medicine 2019 with severe extensive knee osteoarthritis - recommended weight loss at that time in preparation for knee replacement surgery - she is aware she would optimally need BMI <40 prior to orthopedic surgery.

## 2021-05-28 NOTE — Patient Instructions (Addendum)
Flu shot today A1c today We will request latest eye exam from Dr Satira Sark [facility] on [facility]).  Left hip xray today  Return in 6 months for physical

## 2021-05-28 NOTE — Progress Notes (Signed)
Patient ID: Leah Peterson, female    DOB: April 21, 1956, 66 y.o.   MRN: [id number]  This visit was conducted in person.  BP 134/82    Pulse 69    Temp (!) 97.5 F (36.4 C) (Temporal)    Ht 5\' 1"  (1.549 m)    Wt 290 lb 8 oz (131.8 kg)    SpO2 98%    BMI 54.89 kg/m    CC: 6 mo DM f/u visit  Subjective:   HPI: Leah Peterson is a 66 y.o. female presenting on 05/28/2021 for Diabetes (Here for 6 mo f/u.)   Chronic L knee pain, now L hip really hurting - constant pain.  Working on weight loss - decreased portion sizes.  Ready to proceed with treatment - but aware needs significant weight loss. Wants to avoid bariatric surgery in h/o abdominal surgeries in the past from complicated diverticulitis (2015, 2018). Declines medication.   HTN - Compliant with current antihypertensive regimen of amlodipine 5mg  daily, losartan/hctz 50/12.5mg  daily (new addition). Does not check blood pressures at home. No low blood pressure readings or symptoms of dizziness/syncope. Denies HA, vision changes, CP/tightness, SOB, leg swelling.   DM - does not regularly check sugars. Compliant with antihyperglycemic regimen which includes: diet controlled - metformin stopped 10/2020 due to longstanding good glycemic control. Denies low sugars or hypoglycemic symptoms. Denies paresthesias, blurry vision. Last diabetic eye exam - regularly - sees Dr Satira Sark and Dr Posey Pronto (last seen 03/2021). Glucometer brand: doesn't have this. Last foot exam: 05/2020 - due. DSME: [facility] 12/2019. Lab Results  Component Value Date   HGBA1C 5.8 (A) 05/28/2021   Diabetic Foot Exam - Simple   Simple Foot Form Diabetic Foot exam was performed with the following findings: Yes 05/28/2021  2:38 PM  Visual Inspection See comments: Yes Sensation Testing Intact to touch and monofilament testing bilaterally: Yes Pulse Check Posterior Tibialis and Dorsalis pulse intact bilaterally: Yes Comments 2+ DP bilaterally Varicose veins BLE     Lab Results  Component Value Date   MICROALBUR 1.1 11/18/2020        Relevant past medical, surgical, family and social history reviewed and updated as indicated. Interim medical history since our last visit reviewed. Allergies and medications reviewed and updated. Outpatient Medications Prior to Visit  Medication Sig Dispense Refill   Acetaminophen (TYLENOL ARTHRITIS EXT RELIEF PO) Take by mouth.     amLODipine (NORVASC) 5 MG tablet Take 1 tablet (5 mg total) by mouth daily. 90 tablet 3   cetirizine (ZYRTEC) 10 MG tablet Take 10 mg by mouth at bedtime as needed for allergies.     Cholecalciferol (VITAMIN D) 2000 units CAPS Take 1 capsule (2,000 Units total) by mouth daily. 30 capsule    latanoprost (XALATAN) 0.005 % ophthalmic solution Place 1 drop into both eyes at bedtime.   0   losartan-hydrochlorothiazide (HYZAAR) 50-12.5 MG tablet Take 1 tablet by mouth daily. 90 tablet 3   Multiple Vitamin (MULTIVITAMIN WITH MINERALS) TABS tablet Take 1 tablet by mouth daily.     timolol (TIMOPTIC) 0.5 % ophthalmic solution INSTILL 1 DROP INTO BOTH EYES EVERY DAY     trolamine salicylate (ASPERCREME) 10 % cream Apply 1 application topically as needed (for knee pain).      UNABLE TO FIND Med Name: Joint support condroitin     No facility-administered medications prior to visit.     Per HPI unless specifically indicated in ROS section below Review of Systems  Objective:  BP 134/82    Pulse 69    Temp (!) 97.5 F (36.4 C) (Temporal)    Ht 5\' 1"  (1.549 m)    Wt 290 lb 8 oz (131.8 kg)    SpO2 98%    BMI 54.89 kg/m   Wt Readings from Last 3 Encounters:  05/28/21 290 lb 8 oz (131.8 kg)  11/25/20 (!) 302 lb (137 kg)  05/27/20 293 lb 2 oz (133 kg)      Physical Exam Vitals and nursing note reviewed.  Constitutional:      Appearance: Normal appearance. She is obese. She is not ill-appearing.  Cardiovascular:     Rate and Rhythm: Normal rate and regular rhythm.     Pulses: Normal pulses.      Heart sounds: Normal heart sounds. No murmur heard. Pulmonary:     Effort: Pulmonary effort is normal. No respiratory distress.     Breath sounds: Normal breath sounds. No wheezing, rhonchi or rales.  Musculoskeletal:        General: Swelling present. No tenderness.     Right lower leg: Edema present.     Left lower leg: Edema present.     Comments:  See HPI for foot exam if done Evidence of varicose veins BLE Negative stemmer sign Discomfort to lateral left hip with int/ext rotation at hip. No reproducible pain at SIJ, GTB or sciatic notch bilaterally.    Skin:    General: Skin is warm and dry.     Findings: No rash.  Neurological:     Mental Status: She is alert.  Psychiatric:        Mood and Affect: Mood normal.        Behavior: Behavior normal.      Results for orders placed or performed in visit on 05/28/21  POCT glycosylated hemoglobin (Hb A1C)  Result Value Ref Range   Hemoglobin A1C 5.8 (A) 4.0 - 5.6 %   HbA1c POC (<> result, manual entry)     HbA1c, POC (prediabetic range)     HbA1c, POC (controlled diabetic range)      Assessment & Plan:  This visit occurred during the SARS-CoV-2 public health emergency.  Safety protocols were in place, including screening questions prior to the visit, additional usage of staff PPE, and extensive cleaning of exam room while observing appropriate contact time as indicated for disinfecting solutions.   Problem List Items Addressed This Visit     Morbid obesity with BMI of 50.0-59.9, adult ([place])    Congratulated on 10 lb weight loss with healthy diet choices. Activity limited by leg joint pains.  She wants to avoid bariatric surgery and wants to try through diet /lifestyle changes first.  Discussed possible GLP1RA use (ozempic vs trulicity) to help with weight loss. She will schedule f/u office visit if desires to try these.       Primary hypertension    Chronic, improved control on current regimen including losartan. Continue  this.       Degenerative arthritis of knee, bilateral    Saw sports medicine 2019 with severe extensive knee osteoarthritis - recommended weight loss at that time in preparation for knee replacement surgery - she is aware she would optimally need BMI <40 prior to orthopedic surgery.       Controlled diabetes mellitus type 2 with complications (HCC) - Primary    Chronic, great control off medication - sugars remain in prediabetes range.  Discussed possible GLP1RA use for goal weight loss.  Will  request latest eye exam report.       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       Chronic venous insufficiency    Discussed diagnosis with patient.       Left hip pain    Story suspicious for L trochanteric bursitis however not consistent on exam. Will check baseline L hip xrays.       Relevant Orders   DG Hip Unilat W OR W/O Pelvis 2-3 Views Left   Other Visit Diagnoses     Need for influenza vaccination       Relevant Orders   Flu Vaccine QUAD 69mo+IM (Fluarix, Fluzone & Alfiuria Quad PF) (Completed)        No orders of the defined types were placed in this encounter.  Orders Placed This Encounter  Procedures   DG Hip Unilat W OR W/O Pelvis 2-3 Views Left    Standing Status:   Future    Number of Occurrences:   1    Standing Expiration Date:   05/28/2022    Order Specific Question:   Reason for Exam (SYMPTOM  OR DIAGNOSIS REQUIRED)    Answer:   L lateral hip pain eval arthritis    Order Specific Question:   Preferred imaging location?    Answer:   [facility]   Flu Vaccine QUAD 3mo+IM (Fluarix, Fluzone & Alfiuria Quad PF)   POCT glycosylated hemoglobin (Hb A1C)     Patient Instructions  Flu shot today A1c today We will request latest eye exam from Dr Satira Sark [facility] on [facility]).  Left hip xray today  Return  in 6 months for physical   Follow up plan: Return in about 6 months (around 11/25/2021), or if symptoms worsen or fail to improve, for annual exam, prior fasting for blood work.  Ria Bush, MD

## 2021-05-28 NOTE — Assessment & Plan Note (Signed)
Discussed diagnosis with patient.

## 2021-05-28 NOTE — Assessment & Plan Note (Signed)
Chronic, great control off medication - sugars remain in prediabetes range.  Discussed possible GLP1RA use for goal weight loss.  Will request latest eye exam report.

## 2021-05-28 NOTE — Assessment & Plan Note (Signed)
Story suspicious for L trochanteric bursitis however not consistent on exam. Will check baseline L hip xrays.

## 2021-05-28 NOTE — Assessment & Plan Note (Signed)
?  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

## 2021-05-28 NOTE — Assessment & Plan Note (Signed)
Chronic, improved control on current regimen including losartan. Continue this.

## 2021-05-31 ENCOUNTER — Encounter: Payer: Self-pay | Admitting: Family Medicine

## 2021-05-31 DIAGNOSIS — H401190 Primary open-angle glaucoma, unspecified eye, stage unspecified: Secondary | ICD-10-CM | POA: Insufficient documentation

## 2021-06-22 LAB — HM DIABETES EYE EXAM

## 2021-09-06 ENCOUNTER — Telehealth: Payer: Self-pay | Admitting: Family Medicine

## 2021-09-09 ENCOUNTER — Other Ambulatory Visit: Payer: Self-pay | Admitting: Family Medicine

## 2021-09-09 DIAGNOSIS — Z1231 Encounter for screening mammogram for malignant neoplasm of breast: Secondary | ICD-10-CM

## 2021-11-12 NOTE — Telephone Encounter (Signed)
error 

## 2021-11-19 ENCOUNTER — Other Ambulatory Visit: Payer: BC Managed Care – PPO

## 2021-11-19 ENCOUNTER — Ambulatory Visit
Admission: RE | Admit: 2021-11-19 | Discharge: 2021-11-19 | Disposition: A | Discharge status: Home / Self Care | Payer: BC Managed Care – PPO | Source: Ambulatory Visit | Attending: Family Medicine | Admitting: Family Medicine

## 2021-11-19 DIAGNOSIS — Z1231 Encounter for screening mammogram for malignant neoplasm of breast: Secondary | ICD-10-CM

## 2021-11-21 ENCOUNTER — Other Ambulatory Visit: Payer: Self-pay | Admitting: Family Medicine

## 2021-11-21 DIAGNOSIS — E1169 Type 2 diabetes mellitus with other specified complication: Secondary | ICD-10-CM

## 2021-11-21 DIAGNOSIS — E559 Vitamin D deficiency, unspecified: Secondary | ICD-10-CM

## 2021-11-21 DIAGNOSIS — E118 Type 2 diabetes mellitus with unspecified complications: Secondary | ICD-10-CM

## 2021-11-22 ENCOUNTER — Other Ambulatory Visit (INDEPENDENT_AMBULATORY_CARE_PROVIDER_SITE_OTHER): Payer: BC Managed Care – PPO

## 2021-11-22 DIAGNOSIS — E1169 Type 2 diabetes mellitus with other specified complication: Secondary | ICD-10-CM | POA: Diagnosis not present

## 2021-11-22 DIAGNOSIS — E118 Type 2 diabetes mellitus with unspecified complications: Secondary | ICD-10-CM | POA: Diagnosis not present

## 2021-11-22 DIAGNOSIS — E559 Vitamin D deficiency, unspecified: Secondary | ICD-10-CM

## 2021-11-22 DIAGNOSIS — E785 Hyperlipidemia, unspecified: Secondary | ICD-10-CM

## 2021-11-22 LAB — COMPREHENSIVE METABOLIC PANEL
ALT: 13 U/L (ref 0–35)
AST: 15 U/L (ref 0–37)
Albumin: 4.3 g/dL (ref 3.5–5.2)
Alkaline Phosphatase: 42 U/L (ref 39–117)
BUN: 27 mg/dL — ABNORMAL HIGH (ref 6–23)
CO2: 27 mEq/L (ref 19–32)
Calcium: 9.3 mg/dL (ref 8.4–10.5)
Chloride: 107 mEq/L (ref 96–112)
Creatinine, Ser: 1.11 mg/dL (ref 0.40–1.20)
GFR: 52.81 mL/min — ABNORMAL LOW (ref >60.00)
Glucose, Bld: 89 mg/dL (ref 70–99)
Potassium: 4.1 mEq/L (ref 3.5–5.1)
Sodium: 142 mEq/L (ref 135–145)
Total Bilirubin: 0.4 mg/dL (ref 0.2–1.2)
Total Protein: 7.1 g/dL (ref 6.0–8.3)

## 2021-11-22 LAB — LIPID PANEL
Cholesterol: 177 mg/dL (ref 0–200)
HDL: 57.4 mg/dL (ref >39.00)
LDL Cholesterol: 109 mg/dL — ABNORMAL HIGH (ref 0–99)
NonHDL: 119.72
Total CHOL/HDL Ratio: 3
Triglycerides: 52 mg/dL (ref 0.0–149.0)
VLDL: 10.4 mg/dL (ref 0.0–40.0)

## 2021-11-22 LAB — MICROALBUMIN / CREATININE URINE RATIO
Creatinine,U: 117.2 mg/dL
Microalb Creat Ratio: 0.6 mg/g (ref 0.0–30.0)
Microalb, Ur: <0.7 mg/dL (ref 0.0–1.9)

## 2021-11-22 LAB — VITAMIN D 25 HYDROXY (VIT D DEFICIENCY, FRACTURES): VITD: 38.03 ng/mL (ref 30.00–100.00)

## 2021-11-22 LAB — HEMOGLOBIN A1C: Hgb A1c MFr Bld: 6 % (ref 4.6–6.5)

## 2021-11-26 ENCOUNTER — Encounter: Payer: BC Managed Care – PPO | Admitting: Family Medicine

## 2021-11-30 ENCOUNTER — Ambulatory Visit (INDEPENDENT_AMBULATORY_CARE_PROVIDER_SITE_OTHER): Payer: BC Managed Care – PPO | Admitting: Family Medicine

## 2021-11-30 ENCOUNTER — Encounter: Payer: Self-pay | Admitting: Family Medicine

## 2021-11-30 VITALS — BP 134/80 | HR 68 | Temp 97.1°F | Ht 60.5 in | Wt 286.2 lb

## 2021-11-30 DIAGNOSIS — I10 Essential (primary) hypertension: Secondary | ICD-10-CM

## 2021-11-30 DIAGNOSIS — Z6841 Body Mass Index (BMI) 40.0 and over, adult: Secondary | ICD-10-CM

## 2021-11-30 DIAGNOSIS — Z0001 Encounter for general adult medical examination with abnormal findings: Secondary | ICD-10-CM

## 2021-11-30 DIAGNOSIS — M25552 Pain in left hip: Secondary | ICD-10-CM

## 2021-11-30 DIAGNOSIS — E559 Vitamin D deficiency, unspecified: Secondary | ICD-10-CM

## 2021-11-30 DIAGNOSIS — E785 Hyperlipidemia, unspecified: Secondary | ICD-10-CM

## 2021-11-30 DIAGNOSIS — E118 Type 2 diabetes mellitus with unspecified complications: Secondary | ICD-10-CM

## 2021-11-30 DIAGNOSIS — I890 Lymphedema, not elsewhere classified: Secondary | ICD-10-CM

## 2021-11-30 DIAGNOSIS — H33103 Unspecified retinoschisis, bilateral: Secondary | ICD-10-CM

## 2021-11-30 DIAGNOSIS — N289 Disorder of kidney and ureter, unspecified: Secondary | ICD-10-CM | POA: Insufficient documentation

## 2021-11-30 DIAGNOSIS — H409 Unspecified glaucoma: Secondary | ICD-10-CM

## 2021-11-30 DIAGNOSIS — E1169 Type 2 diabetes mellitus with other specified complication: Secondary | ICD-10-CM | POA: Diagnosis not present

## 2021-11-30 DIAGNOSIS — H401130 Primary open-angle glaucoma, bilateral, stage unspecified: Secondary | ICD-10-CM

## 2021-11-30 DIAGNOSIS — I872 Venous insufficiency (chronic) (peripheral): Secondary | ICD-10-CM

## 2021-11-30 DIAGNOSIS — M170 Bilateral primary osteoarthritis of knee: Secondary | ICD-10-CM

## 2021-11-30 MED ORDER — LOSARTAN POTASSIUM-HCTZ 50-12.5 MG PO TABS
1.0000 | ORAL_TABLET | Freq: Every day | ORAL | 3 refills | Status: DC
Start: 2021-11-30 — End: 2022-06-03

## 2021-11-30 MED ORDER — AMLODIPINE BESYLATE 5 MG PO TABS
5.0000 mg | ORAL_TABLET | Freq: Every day | ORAL | 3 refills | Status: DC
Start: 2021-11-30 — End: 2022-12-02

## 2021-11-30 NOTE — Assessment & Plan Note (Signed)
Appreciate ophthalmology care.

## 2021-11-30 NOTE — Assessment & Plan Note (Signed)
Regularly seeing retinologist.

## 2021-11-30 NOTE — Assessment & Plan Note (Signed)
Chronic, stable on current regimen - continue this.  

## 2021-11-30 NOTE — Assessment & Plan Note (Signed)
Suspicious for lymphedema. Will continue working towards weight loss.

## 2021-11-30 NOTE — Assessment & Plan Note (Addendum)
Notes recently worsening blurry vision, L>R. He is overdue for eye exam - requests return to [facility] - referral placed.  

## 2021-11-30 NOTE — Assessment & Plan Note (Signed)
Mild, overall stable off statin.  The 10-year ASCVD risk score (Arnett DK, et al., 2019) is: 18.3%   Values used to calculate the score:     Age: 66 years     Sex: Female     Is Non-Hispanic African American: Yes     Diabetic: Yes     Tobacco smoker: No     Systolic Blood Pressure: 366 mmHg     Is BP treated: Yes     HDL Cholesterol: 57.4 mg/dL     Total Cholesterol: 177 mg/dL

## 2021-11-30 NOTE — Assessment & Plan Note (Addendum)
Continues diet controlled. Will request latest diabetic eye exam.

## 2021-11-30 NOTE — Progress Notes (Signed)
Patient ID: Leah Peterson, female    DOB: July 02, 1955, 66 y.o.   MRN: [id number]  This visit was conducted in person.  BP 134/80   Pulse 68   Temp (!) 97.1 F (36.2 C) (Temporal)   Ht 5' 0.5" (1.537 m)   Wt 286 lb 4 oz (129.8 kg)   SpO2 98%   BMI 54.98 kg/m    CC: CPE  Subjective:   HPI: Leah Peterson is a 66 y.o. female presenting on 11/30/2021 for Annual Exam   Recent participation in conference in [place] for black elected [employer].    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   Ongoing left knee and hip pain. Has been recommended bariatric surgery (goal BMI <40) prior to L knee replacement for known knee osteoarthritis. Hesitant for bariatric surgery in abdominal history, desires working on diet/lifestyle instead.    Prediabetes/DM - longterm managed on metformin '500mg'$  daily - this was stopped 10/2020 due to longstanding good control.   Continues [place] vitamin - needs to update supply.    Preventative: COLONOSCOPY Date: 06/30/2015 diverticulosis, rpt 10 yrs Carlean Purl) Pap smear - always normal, last done 10/2018 - normal but absent ECC zone -  will repeat next year. No pelvic pain or vaginal bleeding.  LMP 10/2007. No post menopausal sxs. [facility]  Lung cancer screening - not eligible  Flu shot yearly J&J vaccine 07/2019, Moderna booster 03/2020  Tetanus 2010. Tdap 10/2018 Pneumovax 07/2013  Shingrix - 12/2018, 03/2019  Seat belt use discussed  Sunscreen use discussed. No suspicious moles on skin.  Ex smoker - quit remotely  Alcohol - seldom  Dentist - Q6 mo  Eye exam - Q6 mo for borderline glaucoma, sees retina specialist Posey Pronto) yearly as well   Caffeine: hot tea in the morning  Lives with daughter and grand daughter. No pets.   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  Diet: good water, fruits/vegetables daily. Continues intermittent fasting.      Relevant past medical, surgical, family and social history reviewed and updated as indicated. Interim medical history since our last visit reviewed. Allergies and medications reviewed and updated. Outpatient Medications Prior to Visit  Medication Sig Dispense Refill   Acetaminophen (TYLENOL ARTHRITIS EXT RELIEF PO) Take by mouth.     cetirizine (ZYRTEC) 10 MG tablet Take 10 mg by mouth at bedtime as needed for allergies.     Cholecalciferol (VITAMIN D) 2000 units CAPS Take 1 capsule (2,000 Units total) by mouth daily. 30 capsule    latanoprost (XALATAN) 0.005 % ophthalmic solution Place 1 drop into both eyes at bedtime.   0   Multiple Vitamin (MULTIVITAMIN WITH MINERALS) TABS tablet Take 1 tablet by mouth daily.     timolol (TIMOPTIC) 0.5 % ophthalmic solution INSTILL 1 DROP INTO BOTH EYES EVERY DAY     trolamine salicylate (ASPERCREME) 10 % cream Apply 1 application topically as needed (for knee pain).      UNABLE TO FIND Med Name: Joint support condroitin     amLODipine (NORVASC) 5 MG tablet Take 1 tablet (5 mg total) by mouth daily. 90 tablet 3   losartan-hydrochlorothiazide (HYZAAR) 50-12.5 MG tablet Take 1 tablet by mouth daily. 90 tablet 3   No facility-administered medications prior to visit.     Per HPI unless specifically indicated  in ROS section below Review of Systems  Constitutional:  Negative for activity change, appetite change, chills, fatigue, fever and unexpected weight change.  HENT:  Negative for hearing loss.   Eyes:  Negative for visual disturbance.  Respiratory:  Negative for cough, chest tightness, shortness of breath and wheezing.   Cardiovascular:  Positive for leg swelling. Negative for chest pain and palpitations.  Gastrointestinal:  Negative for abdominal distention, abdominal pain, blood in stool, constipation, diarrhea, nausea and vomiting.   Genitourinary:  Negative for difficulty urinating and hematuria.  Musculoskeletal:  Negative for arthralgias, myalgias and neck pain.  Skin:  Negative for rash.  Neurological:  Negative for dizziness, seizures, syncope and headaches.  Hematological:  Negative for adenopathy. Does not bruise/bleed easily.  Psychiatric/Behavioral:  Negative for dysphoric mood. The patient is not nervous/anxious.     Objective:  BP 134/80   Pulse 68   Temp (!) 97.1 F (36.2 C) (Temporal)   Ht 5' 0.5" (1.537 m)   Wt 286 lb 4 oz (129.8 kg)   SpO2 98%   BMI 54.98 kg/m   Wt Readings from Last 3 Encounters:  11/30/21 286 lb 4 oz (129.8 kg)  05/28/21 290 lb 8 oz (131.8 kg)  11/25/20 (!) 302 lb (137 kg)      Physical Exam Vitals and nursing note reviewed.  Constitutional:      Appearance: Normal appearance. She is obese. She is not ill-appearing.  HENT:     Head: Normocephalic and atraumatic.     Right Ear: Tympanic membrane, ear canal and external ear normal. There is no impacted cerumen.     Left Ear: Tympanic membrane, ear canal and external ear normal. There is no impacted cerumen.  Eyes:     General:        Right eye: No discharge.        Left eye: No discharge.     Extraocular Movements: Extraocular movements intact.     Conjunctiva/sclera: Conjunctivae normal.     Pupils: Pupils are equal, round, and reactive to light.  Neck:     Thyroid: No thyroid mass or thyromegaly.  Cardiovascular:     Rate and Rhythm: Normal rate and regular rhythm.     Pulses: Normal pulses.     Heart sounds: Normal heart sounds. No murmur heard. Pulmonary:     Effort: Pulmonary effort is normal. No respiratory distress.     Breath sounds: Normal breath sounds. No wheezing, rhonchi or rales.  Abdominal:     General: Bowel sounds are normal. There is no distension.     Palpations: Abdomen is soft. There is no mass.     Tenderness: There is no abdominal tenderness. There is no guarding or rebound.     Hernia:  No hernia is present.  Musculoskeletal:     Cervical back: Normal range of motion and neck supple. No rigidity.     Right lower leg: No edema.     Left lower leg: No edema.  Lymphadenopathy:     Cervical: No cervical adenopathy.  Skin:    General: Skin is warm and dry.     Findings: No rash.  Neurological:     General: No focal deficit present.     Mental Status: She is alert. Mental status is at baseline.  Psychiatric:        Mood and Affect: Mood normal.        Behavior: Behavior normal.       Results for orders placed or  performed in visit on 11/22/21  Microalbumin / creatinine urine ratio  Result Value Ref Range   Microalb, Ur <0.7 0.0 - 1.9 mg/dL   Creatinine,U 117.2 mg/dL   Microalb Creat Ratio 0.6 0.0 - 30.0 mg/g  VITAMIN D 25 Hydroxy (Vit-D Deficiency, Fractures)  Result Value Ref Range   VITD 38.03 30.00 - 100.00 ng/mL  Lipid panel  Result Value Ref Range   Cholesterol 177 0 - 200 mg/dL   Triglycerides 52.0 0.0 - 149.0 mg/dL   HDL 57.40 >39.00 mg/dL   VLDL 10.4 0.0 - 40.0 mg/dL   LDL Cholesterol 109 (H) 0 - 99 mg/dL   Total CHOL/HDL Ratio 3    NonHDL 119.72   Comprehensive metabolic panel  Result Value Ref Range   Sodium 142 135 - 145 mEq/L   Potassium 4.1 3.5 - 5.1 mEq/L   Chloride 107 96 - 112 mEq/L   CO2 27 19 - 32 mEq/L   Glucose, Bld 89 70 - 99 mg/dL   BUN 27 (H) 6 - 23 mg/dL   Creatinine, Ser 1.11 0.40 - 1.20 mg/dL   Total Bilirubin 0.4 0.2 - 1.2 mg/dL   Alkaline Phosphatase 42 39 - 117 U/L   AST 15 0 - 37 U/L   ALT 13 0 - 35 U/L   Total Protein 7.1 6.0 - 8.3 g/dL   Albumin 4.3 3.5 - 5.2 g/dL   GFR 52.81 (L) >60.00 mL/min   Calcium 9.3 8.4 - 10.5 mg/dL  Hemoglobin A1c  Result Value Ref Range   Hgb A1c MFr Bld 6.0 4.6 - 6.5 %    Assessment & Plan:   Problem List Items Addressed This Visit     Encounter for general adult medical examination with abnormal findings - Primary (Chronic)    Preventative protocols reviewed and updated unless pt  declined. Discussed healthy diet and lifestyle.       Vitamin D deficiency    Continue vitamin D 2000 IU daily.       Hyperlipidemia associated with type 2 diabetes mellitus (HCC)    Mild, overall stable off statin.  The 10-year ASCVD risk score (Arnett DK, et al., 2019) is: 18.3%   Values used to calculate the score:     Age: 80 years     Sex: Female     Is Non-Hispanic African American: Yes     Diabetic: Yes     Tobacco smoker: No     Systolic Blood Pressure: 382 mmHg     Is BP treated: Yes     HDL Cholesterol: 57.4 mg/dL     Total Cholesterol: 177 mg/dL       Relevant Medications   amLODipine (NORVASC) 5 MG tablet   losartan-hydrochlorothiazide (HYZAAR) 50-12.5 MG tablet   Morbid obesity with BMI of 50.0-59.9, adult ([place])    Congratulated on another 4 lb weight loss.  She is motivated to continue healthy diet and lifestyle choices to affect sustainable weight loss.       Primary hypertension    Chronic, stable on current regimen - continue this.       Relevant Medications   amLODipine (NORVASC) 5 MG tablet   losartan-hydrochlorothiazide (HYZAAR) 50-12.5 MG tablet   Degenerative arthritis of knee, bilateral    L>R.       Controlled diabetes mellitus type 2 with complications ([place])    Continues diet controlled. Will request latest diabetic eye exam.       Relevant Medications   losartan-hydrochlorothiazide (HYZAAR) 50-12.5 MG  tablet   Glaucoma    Appreciate ophthalmology care.       Lymphedema    Suspicious for lymphedema. Will continue working towards weight loss.       Retinoschisis, bilateral    Regularly seeing retinologist.       Chronic venous insufficiency   Relevant Medications   amLODipine (NORVASC) 5 MG tablet   losartan-hydrochlorothiazide (HYZAAR) 50-12.5 MG tablet   Left hip pain    Anticipate osteoarthritis related.       POAG (primary open-angle glaucoma)    Regularly sees eye doctor      Renal insufficiency    Progressive over  last few years - encourage good hydration status, limit NSAIDs (already does this), recheck at 6 mo f/u visit.         Meds ordered this encounter  Medications   amLODipine (NORVASC) 5 MG tablet    Sig: Take 1 tablet (5 mg total) by mouth daily.    Dispense:  90 tablet    Refill:  3   losartan-hydrochlorothiazide (HYZAAR) 50-12.5 MG tablet    Sig: Take 1 tablet by mouth daily.    Dispense:  90 tablet    Refill:  3   No orders of the defined types were placed in this encounter.    Patient instructions: We will plan for pap smear next year.  We will request latest diabetic eye exam from Dr Satira Sark at [facility] ophthalmology.  Ensure good water intake to keep kidneys hydrated. Return in 6 months with labs for follow up visit.   Follow up plan: Return in about 6 months (around 06/02/2022), or if symptoms worsen or fail to improve, for follow up visit.  Ria Bush, MD

## 2021-11-30 NOTE — Assessment & Plan Note (Signed)
Regularly sees eye doctor.  

## 2021-11-30 NOTE — Assessment & Plan Note (Signed)
Progressive over last few years - encourage good hydration status, limit NSAIDs (already does this), recheck at 6 mo f/u visit.

## 2021-11-30 NOTE — Patient Instructions (Addendum)
We will plan for pap smear next year.  We will request latest diabetic eye exam from Dr Leah Peterson at [facility] ophthalmology.  Ensure good water intake to keep kidneys hydrated. Return in 6 months with labs for follow up visit.   Health Maintenance for Postmenopausal Women Menopause is a normal process in which your ability to get pregnant comes to an end. This process happens slowly over many months or years, usually between the ages of 77 and 21. Menopause is complete when you have missed your menstrual period for 12 months. It is important to talk with your health care provider about some of the most common conditions that affect women after menopause (postmenopausal women). These include heart disease, cancer, and bone loss (osteoporosis). Adopting a healthy lifestyle and getting preventive care can help to promote your health and wellness. The actions you take can also lower your chances of developing some of these common conditions. What are the signs and symptoms of menopause? During menopause, you may have the following symptoms: Hot flashes. These can be moderate or severe. Night sweats. Decrease in sex drive. Mood swings. Headaches. Tiredness (fatigue). Irritability. Memory problems. Problems falling asleep or staying asleep. Talk with your health care provider about treatment options for your symptoms. Do I need hormone replacement therapy? Hormone replacement therapy is effective in treating symptoms that are caused by menopause, such as hot flashes and night sweats. Hormone replacement carries certain risks, especially as you become older. If you are thinking about using estrogen or estrogen with progestin, discuss the benefits and risks with your health care provider. How can I reduce my risk for heart disease and stroke? The risk of heart disease, heart attack, and stroke increases as you age. One of the causes may be a change in the body's hormones during menopause. This can affect how  your body uses dietary fats, triglycerides, and cholesterol. Heart attack and stroke are medical emergencies. There are many things that you can do to help prevent heart disease and stroke. Watch your blood pressure High blood pressure causes heart disease and increases the risk of stroke. This is more likely to develop in people who have high blood pressure readings or are overweight. Have your blood pressure checked: Every 3-5 years if you are 12-4 years of age. Every year if you are 59 years old or older. Eat a healthy diet  Eat a diet that includes plenty of vegetables, fruits, low-fat dairy products, and lean protein. Do not eat a lot of foods that are high in solid fats, added sugars, or sodium. Get regular exercise Get regular exercise. This is one of the most important things you can do for your health. Most adults should: Try to exercise for at least 150 minutes each week. The exercise should increase your heart rate and make you sweat (moderate-intensity exercise). Try to do strengthening exercises at least twice each week. Do these in addition to the moderate-intensity exercise. Spend less time sitting. Even light physical activity can be beneficial. Other tips Work with your health care provider to achieve or maintain a healthy weight. Do not use any products that contain nicotine or tobacco. These products include cigarettes, chewing tobacco, and vaping devices, such as e-cigarettes. If you need help quitting, ask your health care provider. Know your numbers. Ask your health care provider to check your cholesterol and your blood sugar (glucose). Continue to have your blood tested as directed by your health care provider. Do I need screening for cancer? Depending on  your health history and family history, you may need to have cancer screenings at different stages of your life. This may include screening for: Breast cancer. Cervical cancer. Lung cancer. Colorectal cancer. What is  my risk for osteoporosis? After menopause, you may be at increased risk for osteoporosis. Osteoporosis is a condition in which bone destruction happens more quickly than new bone creation. To help prevent osteoporosis or the bone fractures that can happen because of osteoporosis, you may take the following actions: If you are 71-65 years old, get at least 1,000 mg of calcium and at least 600 international units (IU) of vitamin D per day. If you are older than age 10 but younger than age 2, get at least 1,200 mg of calcium and at least 600 international units (IU) of vitamin D per day. If you are older than age 47, get at least 1,200 mg of calcium and at least 800 international units (IU) of vitamin D per day. Smoking and drinking excessive alcohol increase the risk of osteoporosis. Eat foods that are rich in calcium and vitamin D, and do weight-bearing exercises several times each week as directed by your health care provider. How does menopause affect my mental health? Depression may occur at any age, but it is more common as you become older. Common symptoms of depression include: Feeling depressed. Changes in sleep patterns. Changes in appetite or eating patterns. Feeling an overall lack of motivation or enjoyment of activities that you previously enjoyed. Frequent crying spells. Talk with your health care provider if you think that you are experiencing any of these symptoms. General instructions See your health care provider for regular wellness exams and vaccines. This may include: Scheduling regular health, dental, and eye exams. Getting and maintaining your vaccines. These include: Influenza vaccine. Get this vaccine each year before the flu season begins. Pneumonia vaccine. Shingles vaccine. Tetanus, diphtheria, and pertussis (Tdap) booster vaccine. Your health care provider may also recommend other immunizations. Tell your health care provider if you have ever been abused or do not  feel safe at home. Summary Menopause is a normal process in which your ability to get pregnant comes to an end. This condition causes hot flashes, night sweats, decreased interest in sex, mood swings, headaches, or lack of sleep. Treatment for this condition may include hormone replacement therapy. Take actions to keep yourself healthy, including exercising regularly, eating a healthy diet, watching your weight, and checking your blood pressure and blood sugar levels. Get screened for cancer and depression. Make sure that you are up to date with all your vaccines. This information is not intended to replace advice given to you by your health care provider. Make sure you discuss any questions you have with your health care provider. Document Revised: 09/07/2020 Document Reviewed: 09/07/2020 Elsevier Patient Education  [place].

## 2021-11-30 NOTE — Assessment & Plan Note (Signed)
Congratulated on another 4 lb weight loss.  She is motivated to continue healthy diet and lifestyle choices to affect sustainable weight loss.

## 2021-11-30 NOTE — Assessment & Plan Note (Signed)
Continue vitamin-D 2000 IU daily.

## 2021-11-30 NOTE — Assessment & Plan Note (Signed)
Preventative protocols reviewed and updated unless pt declined. Discussed healthy diet and lifestyle.  

## 2021-11-30 NOTE — Assessment & Plan Note (Signed)
Anticipate osteoarthritis related.

## 2021-12-06 ENCOUNTER — Encounter: Payer: Self-pay | Admitting: Family Medicine

## 2021-12-20 LAB — HM DIABETES EYE EXAM

## 2021-12-21 ENCOUNTER — Encounter: Payer: Self-pay | Admitting: Family Medicine

## 2022-02-08 ENCOUNTER — Ambulatory Visit (INDEPENDENT_AMBULATORY_CARE_PROVIDER_SITE_OTHER): Payer: BC Managed Care – PPO

## 2022-02-08 DIAGNOSIS — Z23 Encounter for immunization: Secondary | ICD-10-CM | POA: Diagnosis not present

## 2022-05-26 ENCOUNTER — Other Ambulatory Visit: Payer: Self-pay | Admitting: Family Medicine

## 2022-05-26 DIAGNOSIS — E559 Vitamin D deficiency, unspecified: Secondary | ICD-10-CM

## 2022-05-26 DIAGNOSIS — N289 Disorder of kidney and ureter, unspecified: Secondary | ICD-10-CM

## 2022-05-26 DIAGNOSIS — E118 Type 2 diabetes mellitus with unspecified complications: Secondary | ICD-10-CM

## 2022-05-27 ENCOUNTER — Other Ambulatory Visit (INDEPENDENT_AMBULATORY_CARE_PROVIDER_SITE_OTHER): Payer: BC Managed Care – PPO

## 2022-05-27 DIAGNOSIS — E118 Type 2 diabetes mellitus with unspecified complications: Secondary | ICD-10-CM

## 2022-05-27 DIAGNOSIS — E559 Vitamin D deficiency, unspecified: Secondary | ICD-10-CM | POA: Diagnosis not present

## 2022-05-27 DIAGNOSIS — N289 Disorder of kidney and ureter, unspecified: Secondary | ICD-10-CM

## 2022-05-27 LAB — RENAL FUNCTION PANEL
Albumin: 4.1 g/dL (ref 3.5–5.2)
BUN: 24 mg/dL — ABNORMAL HIGH (ref 6–23)
CO2: 29 mEq/L (ref 19–32)
Calcium: 9.3 mg/dL (ref 8.4–10.5)
Chloride: 104 mEq/L (ref 96–112)
Creatinine, Ser: 0.92 mg/dL (ref 0.40–1.20)
GFR: 65.92 mL/min (ref >60.00)
Glucose, Bld: 90 mg/dL (ref 70–99)
Phosphorus: 3.9 mg/dL (ref 2.3–4.6)
Potassium: 4.2 mEq/L (ref 3.5–5.1)
Sodium: 141 mEq/L (ref 135–145)

## 2022-05-27 LAB — CBC WITH DIFFERENTIAL/PLATELET
Basophils Absolute: 0 10*3/uL (ref 0.0–0.1)
Basophils Relative: 0.3 % (ref 0.0–3.0)
Eosinophils Absolute: 0 10*3/uL (ref 0.0–0.7)
Eosinophils Relative: 0.9 % (ref 0.0–5.0)
HCT: 35 % — ABNORMAL LOW (ref 36.0–46.0)
Hemoglobin: 11.5 g/dL — ABNORMAL LOW (ref 12.0–15.0)
Lymphocytes Relative: 49.5 % — ABNORMAL HIGH (ref 12.0–46.0)
Lymphs Abs: 2 10*3/uL (ref 0.7–4.0)
MCHC: 32.9 g/dL (ref 30.0–36.0)
MCV: 88.5 fl (ref 78.0–100.0)
Monocytes Absolute: 0.4 10*3/uL (ref 0.1–1.0)
Monocytes Relative: 8.8 % (ref 3.0–12.0)
Neutro Abs: 1.7 10*3/uL (ref 1.4–7.7)
Neutrophils Relative %: 40.5 % — ABNORMAL LOW (ref 43.0–77.0)
Platelets: 214 10*3/uL (ref 150.0–400.0)
RBC: 3.95 Mil/uL (ref 3.87–5.11)
RDW: 13.6 % (ref 11.5–15.5)
WBC: 4.1 10*3/uL (ref 4.0–10.5)

## 2022-05-27 LAB — VITAMIN D 25 HYDROXY (VIT D DEFICIENCY, FRACTURES): VITD: 37.33 ng/mL (ref 30.00–100.00)

## 2022-05-27 LAB — HEMOGLOBIN A1C: Hgb A1c MFr Bld: 6 % (ref 4.6–6.5)

## 2022-06-03 ENCOUNTER — Ambulatory Visit: Payer: BC Managed Care – PPO | Admitting: Family Medicine

## 2022-06-03 ENCOUNTER — Encounter: Payer: Self-pay | Admitting: Family Medicine

## 2022-06-03 VITALS — BP 158/82 | HR 64 | Temp 97.3°F | Ht 60.5 in | Wt 280.4 lb

## 2022-06-03 DIAGNOSIS — E118 Type 2 diabetes mellitus with unspecified complications: Secondary | ICD-10-CM

## 2022-06-03 DIAGNOSIS — N289 Disorder of kidney and ureter, unspecified: Secondary | ICD-10-CM

## 2022-06-03 DIAGNOSIS — I872 Venous insufficiency (chronic) (peripheral): Secondary | ICD-10-CM

## 2022-06-03 DIAGNOSIS — I10 Essential (primary) hypertension: Secondary | ICD-10-CM

## 2022-06-03 DIAGNOSIS — L814 Other melanin hyperpigmentation: Secondary | ICD-10-CM | POA: Diagnosis not present

## 2022-06-03 DIAGNOSIS — Z6841 Body Mass Index (BMI) 40.0 and over, adult: Secondary | ICD-10-CM

## 2022-06-03 MED ORDER — LOSARTAN POTASSIUM-HCTZ 100-12.5 MG PO TABS: 1.0000 | ORAL_TABLET | Freq: Every day | ORAL | 1 refills | Status: DC

## 2022-06-03 NOTE — Assessment & Plan Note (Signed)
Chronic, sugars remain in prediabetes range off medication.  She continues working on weight loss efforts.

## 2022-06-03 NOTE — Assessment & Plan Note (Signed)
To left inner thigh - darkened skin likely post-inflammatory hyperpigmentation.  Did suggest trying lotrimin cream for this.

## 2022-06-03 NOTE — Assessment & Plan Note (Signed)
Chronic deteriorated Recommend low salt diet. Increase hyzaar to 100/12.'5mg'$  daily.  Reassess at f/u visit.  I did ask her to check BP at home.

## 2022-06-03 NOTE — Assessment & Plan Note (Signed)
Improved readings based on recent GFR. Continue to encourage good hydration status and limiting NSAID and other nephrotoxic agents.

## 2022-06-03 NOTE — Progress Notes (Signed)
Patient ID: Leah Peterson, female    DOB: 11/14/1955, 67 y.o.   MRN: [id number]  This visit was conducted in person.  BP (!) 158/82 (BP Location: Right Arm) Comment (Cuff Size): thigh cuff  Pulse 64   Temp (!) 97.3 F (36.3 C) (Temporal)   Ht 5' 0.5" (1.537 m)   Wt 280 lb 6 oz (127.2 kg)   SpO2 96%   BMI 53.86 kg/m    CC: 6 mo f/u visit  Subjective:   HPI: Leah Peterson is a 67 y.o. female presenting on 06/03/2022 for Medical Management of Chronic Issues (Here for 6 mo f/u.)   Very active at work, church, and with grand daughter.  She start taking turmeric tea and ginger tea.  She also notes she had more caffeine yesterday, some diet Dr Malachi Bonds this morning.   Notes dark spot to upper left thigh for the past 3 months. Denies inciting trauma/injury or falls.   Obesity - continues steadily losing weight with healthy diet changes.  She just completed 21d consecration fast.   DM - does not regularly check sugars. Compliant with antihyperglycemic regimen which includes: diet controlled. She has decreased sugar in diet. Denies hypoglycemic symptoms. Denies paresthesias, blurry vision. Last diabetic eye exam 11/2021. Glucometer brand: none. Last foot exam: 05/2021 - DUE. DSME: 2021 at [facility]  Lab Results  Component Value Date   HGBA1C 6.0 05/27/2022   Diabetic Foot Exam - Simple   Simple Foot Form Diabetic Foot exam was performed with the following findings: Yes 06/03/2022 10:21 AM  Visual Inspection See comments: Yes Sensation Testing Intact to touch and monofilament testing bilaterally: Yes Pulse Check Posterior Tibialis and Dorsalis pulse intact bilaterally: Yes Comments Varicose veins to bilateral lower legs/feet with pedal edema and resultant darkening of skin  Maceration to interdigital space between 4th/5th digits R foot    Lab Results  Component Value Date   MICROALBUR <0.7 11/22/2021    HTN - Compliant with current antihypertensive regimen of  amlodipine '5mg'$  daily, hyzaar 50/12.'5mg'$  daily. Does not check blood pressures at home.  No low blood pressure readings or symptoms of dizziness/syncope.  Denies HA, vision changes, CP/tightness, SOB, leg swelling.        Relevant past medical, surgical, family and social history reviewed and updated as indicated. Interim medical history since our last visit reviewed. Allergies and medications reviewed and updated. Outpatient Medications Prior to Visit  Medication Sig Dispense Refill   Acetaminophen (TYLENOL ARTHRITIS EXT RELIEF PO) Take by mouth.     amLODipine (NORVASC) 5 MG tablet Take 1 tablet (5 mg total) by mouth daily. 90 tablet 3   cetirizine (ZYRTEC) 10 MG tablet Take 10 mg by mouth at bedtime as needed for allergies.     Cholecalciferol (VITAMIN D) 2000 units CAPS Take 1 capsule (2,000 Units total) by mouth daily. 30 capsule    latanoprost (XALATAN) 0.005 % ophthalmic solution Place 1 drop into both eyes at bedtime.   0   Multiple Vitamin (MULTIVITAMIN WITH MINERALS) TABS tablet Take 1 tablet by mouth daily.     timolol (TIMOPTIC) 0.5 % ophthalmic solution INSTILL 1 DROP INTO BOTH EYES EVERY DAY     trolamine salicylate (ASPERCREME) 10 % cream Apply 1 application topically as needed (for knee pain).      TURMERIC-GINGER PO Take by mouth. Takes 3 times a week     UNABLE TO FIND Med Name: Joint support condroitin     losartan-hydrochlorothiazide (HYZAAR) 50-12.5 MG  tablet Take 1 tablet by mouth daily. 90 tablet 3   No facility-administered medications prior to visit.     Per HPI unless specifically indicated in ROS section below Review of Systems  Objective:  BP (!) 158/82 (BP Location: Right Arm) Comment (Cuff Size): thigh cuff  Pulse 64   Temp (!) 97.3 F (36.3 C) (Temporal)   Ht 5' 0.5" (1.537 m)   Wt 280 lb 6 oz (127.2 kg)   SpO2 96%   BMI 53.86 kg/m   Wt Readings from Last 3 Encounters:  06/03/22 280 lb 6 oz (127.2 kg)  11/30/21 286 lb 4 oz (129.8 kg)  05/28/21 290  lb 8 oz (131.8 kg)      Physical Exam Vitals and nursing note reviewed.  Constitutional:      Appearance: Normal appearance. She is not ill-appearing.  Eyes:     Extraocular Movements: Extraocular movements intact.     Conjunctiva/sclera: Conjunctivae normal.     Pupils: Pupils are equal, round, and reactive to light.  Cardiovascular:     Rate and Rhythm: Normal rate and regular rhythm.     Pulses: Normal pulses.     Heart sounds: Normal heart sounds. No murmur heard. Pulmonary:     Effort: Pulmonary effort is normal. No respiratory distress.     Breath sounds: Normal breath sounds. No wheezing, rhonchi or rales.  Musculoskeletal:        General: Swelling present. No tenderness.     Right lower leg: Edema present.     Left lower leg: Edema present.     Comments:  See HPI for foot exam if done Varicose veins to bilateral lower extremities Negative stemmer sign  Skin:    General: Skin is warm and dry.     Findings: No rash.  Neurological:     Mental Status: She is alert.  Psychiatric:        Mood and Affect: Mood normal.        Behavior: Behavior normal.       Results for orders placed or performed in visit on 05/27/22  VITAMIN D 25 Hydroxy (Vit-D Deficiency, Fractures)  Result Value Ref Range   VITD 37.33 30.00 - 100.00 ng/mL  Renal function panel  Result Value Ref Range   Sodium 141 135 - 145 mEq/L   Potassium 4.2 3.5 - 5.1 mEq/L   Chloride 104 96 - 112 mEq/L   CO2 29 19 - 32 mEq/L   Albumin 4.1 3.5 - 5.2 g/dL   BUN 24 (H) 6 - 23 mg/dL   Creatinine, Ser 0.92 0.40 - 1.20 mg/dL   Glucose, Bld 90 70 - 99 mg/dL   Phosphorus 3.9 2.3 - 4.6 mg/dL   GFR 65.92 >60.00 mL/min   Calcium 9.3 8.4 - 10.5 mg/dL  CBC with Differential/Platelet  Result Value Ref Range   WBC 4.1 4.0 - 10.5 K/uL   RBC 3.95 3.87 - 5.11 Mil/uL   Hemoglobin 11.5 (L) 12.0 - 15.0 g/dL   HCT 35.0 (L) 36.0 - 46.0 %   MCV 88.5 78.0 - 100.0 fl   MCHC 32.9 30.0 - 36.0 g/dL   RDW 13.6 11.5 - 15.5 %    Platelets 214.0 150.0 - 400.0 K/uL   Neutrophils Relative % 40.5 (L) 43.0 - 77.0 %   Lymphocytes Relative 49.5 (H) 12.0 - 46.0 %   Monocytes Relative 8.8 3.0 - 12.0 %   Eosinophils Relative 0.9 0.0 - 5.0 %   Basophils Relative 0.3 0.0 -  3.0 %   Neutro Abs 1.7 1.4 - 7.7 K/uL   Lymphs Abs 2.0 0.7 - 4.0 K/uL   Monocytes Absolute 0.4 0.1 - 1.0 K/uL   Eosinophils Absolute 0.0 0.0 - 0.7 K/uL   Basophils Absolute 0.0 0.0 - 0.1 K/uL  Hemoglobin A1c  Result Value Ref Range   Hgb A1c MFr Bld 6.0 4.6 - 6.5 %    Assessment & Plan:   Problem List Items Addressed This Visit     Morbid obesity with BMI of 50.0-59.9, adult ([place])    Congratulated on 6 lb weight loss in the past 6 months. She is motivated to continue healthy diet and lifestyle choices. Obesity complicated by comorbidities of diabetes, hypertension, hyperlipidemia and OA.       Primary hypertension    Chronic deteriorated Recommend low salt diet. Increase hyzaar to 100/12.'5mg'$  daily.  Reassess at f/u visit.  I did ask her to check BP at home.       Relevant Medications   losartan-hydrochlorothiazide (HYZAAR) 100-12.5 MG tablet   Controlled diabetes mellitus type 2 with complications (HCC) - Primary    Chronic, sugars remain in prediabetes range off medication.  She continues working on weight loss efforts.       Relevant Medications   losartan-hydrochlorothiazide (HYZAAR) 100-12.5 MG tablet   Chronic venous insufficiency   Relevant Medications   losartan-hydrochlorothiazide (HYZAAR) 100-12.5 MG tablet   Renal insufficiency    Improved readings based on recent GFR. Continue to encourage good hydration status and limiting NSAID and other nephrotoxic agents.       Darkening of skin    To left inner thigh - darkened skin likely post-inflammatory hyperpigmentation.  Did suggest trying lotrimin cream for this.         Meds ordered this encounter  Medications   losartan-hydrochlorothiazide (HYZAAR) 100-12.5 MG tablet     Sig: Take 1 tablet by mouth daily.    Dispense:  90 tablet    Refill:  1    Note new sig    No orders of the defined types were placed in this encounter.   Patient Instructions  Sugars are doing well Try lotrimin (clotrimazole) cream to between right 4th/5th toes and to darker spot on on left thigh.  Blood pressure is staying elevated today - increase losartan/hctz to 100/12.'5mg'$  daily - new dose sent to pharmacy.  Work on low salt /sodium intake, watch caffeine intake, drink plenty of water.  Work on [employer].  Return as needed or in 6 months for physical.   Follow up plan: Return in about 6 months (around 12/02/2022) for annual exam, prior fasting for blood work.  Ria Bush, MD

## 2022-06-03 NOTE — Assessment & Plan Note (Signed)
Congratulated on 6 lb weight loss in the past 6 months. She is motivated to continue healthy diet and lifestyle choices. Obesity complicated by comorbidities of diabetes, hypertension, hyperlipidemia and OA.

## 2022-06-03 NOTE — Patient Instructions (Addendum)
Sugars are doing well Try lotrimin (clotrimazole) cream to between right 4th/5th toes and to darker spot on on left thigh.  Blood pressure is staying elevated today - increase losartan/hctz to 100/12.'5mg'$  daily - new dose sent to pharmacy.  Work on low salt /sodium intake, watch caffeine intake, drink plenty of water.  Work on [employer].  Return as needed or in 6 months for physical.

## 2022-10-21 ENCOUNTER — Other Ambulatory Visit: Payer: Self-pay | Admitting: Family Medicine

## 2022-10-21 DIAGNOSIS — Z1231 Encounter for screening mammogram for malignant neoplasm of breast: Secondary | ICD-10-CM

## 2022-11-22 ENCOUNTER — Ambulatory Visit
Admission: RE | Admit: 2022-11-22 | Discharge: 2022-11-22 | Disposition: A | Discharge status: Home / Self Care | Payer: BC Managed Care – PPO | Source: Ambulatory Visit | Attending: Family Medicine | Admitting: Family Medicine

## 2022-11-22 DIAGNOSIS — Z1231 Encounter for screening mammogram for malignant neoplasm of breast: Secondary | ICD-10-CM

## 2022-11-24 ENCOUNTER — Other Ambulatory Visit: Payer: Self-pay | Admitting: Family Medicine

## 2022-11-24 DIAGNOSIS — D649 Anemia, unspecified: Secondary | ICD-10-CM

## 2022-11-24 DIAGNOSIS — E559 Vitamin D deficiency, unspecified: Secondary | ICD-10-CM

## 2022-11-24 DIAGNOSIS — E118 Type 2 diabetes mellitus with unspecified complications: Secondary | ICD-10-CM

## 2022-11-24 DIAGNOSIS — E1169 Type 2 diabetes mellitus with other specified complication: Secondary | ICD-10-CM

## 2022-11-25 ENCOUNTER — Other Ambulatory Visit (INDEPENDENT_AMBULATORY_CARE_PROVIDER_SITE_OTHER): Payer: BC Managed Care – PPO

## 2022-11-25 DIAGNOSIS — E785 Hyperlipidemia, unspecified: Secondary | ICD-10-CM | POA: Diagnosis not present

## 2022-11-25 DIAGNOSIS — E559 Vitamin D deficiency, unspecified: Secondary | ICD-10-CM

## 2022-11-25 DIAGNOSIS — E118 Type 2 diabetes mellitus with unspecified complications: Secondary | ICD-10-CM

## 2022-11-25 DIAGNOSIS — E1169 Type 2 diabetes mellitus with other specified complication: Secondary | ICD-10-CM

## 2022-11-25 DIAGNOSIS — D649 Anemia, unspecified: Secondary | ICD-10-CM

## 2022-11-25 LAB — LIPID PANEL
Cholesterol: 194 mg/dL (ref 0–200)
HDL: 63.5 mg/dL (ref >39.00)
LDL Cholesterol: 120 mg/dL — ABNORMAL HIGH (ref 0–99)
NonHDL: 130.37
Total CHOL/HDL Ratio: 3
Triglycerides: 53 mg/dL (ref 0.0–149.0)
VLDL: 10.6 mg/dL (ref 0.0–40.0)

## 2022-11-25 LAB — IBC PANEL
Iron: 73 ug/dL (ref 42–145)
Saturation Ratios: 24.5 % (ref 20.0–50.0)
TIBC: 298.2 ug/dL (ref 250.0–450.0)
Transferrin: 213 mg/dL (ref 212.0–360.0)

## 2022-11-25 LAB — COMPREHENSIVE METABOLIC PANEL
ALT: 14 U/L (ref 0–35)
AST: 15 U/L (ref 0–37)
Albumin: 4.3 g/dL (ref 3.5–5.2)
Alkaline Phosphatase: 45 U/L (ref 39–117)
BUN: 22 mg/dL (ref 6–23)
CO2: 27 mEq/L (ref 19–32)
Calcium: 9.7 mg/dL (ref 8.4–10.5)
Chloride: 106 mEq/L (ref 96–112)
Creatinine, Ser: 1.03 mg/dL (ref 0.40–1.20)
GFR: 57.36 mL/min — ABNORMAL LOW (ref >60.00)
Glucose, Bld: 92 mg/dL (ref 70–99)
Potassium: 4 mEq/L (ref 3.5–5.1)
Sodium: 143 mEq/L (ref 135–145)
Total Bilirubin: 0.6 mg/dL (ref 0.2–1.2)
Total Protein: 7.4 g/dL (ref 6.0–8.3)

## 2022-11-25 LAB — MICROALBUMIN / CREATININE URINE RATIO
Creatinine,U: 111.5 mg/dL
Microalb Creat Ratio: 0.9 mg/g (ref 0.0–30.0)
Microalb, Ur: 1 mg/dL (ref 0.0–1.9)

## 2022-11-25 LAB — HEMOGLOBIN A1C: Hgb A1c MFr Bld: 5.9 % (ref 4.6–6.5)

## 2022-11-25 LAB — VITAMIN D 25 HYDROXY (VIT D DEFICIENCY, FRACTURES): VITD: 40.43 ng/mL (ref 30.00–100.00)

## 2022-12-01 ENCOUNTER — Encounter: Payer: BC Managed Care – PPO | Admitting: Family Medicine

## 2022-12-02 ENCOUNTER — Encounter: Payer: Self-pay | Admitting: Family Medicine

## 2022-12-02 ENCOUNTER — Ambulatory Visit (INDEPENDENT_AMBULATORY_CARE_PROVIDER_SITE_OTHER): Payer: BC Managed Care – PPO | Admitting: Family Medicine

## 2022-12-02 ENCOUNTER — Other Ambulatory Visit (HOSPITAL_COMMUNITY)
Admission: RE | Admit: 2022-12-02 | Discharge: 2022-12-02 | Disposition: A | Discharge status: Home / Self Care | Payer: BC Managed Care – PPO | Source: Ambulatory Visit | Attending: Family Medicine | Admitting: Family Medicine

## 2022-12-02 VITALS — BP 138/86 | HR 62 | Temp 97.3°F | Ht 60.5 in | Wt 280.1 lb

## 2022-12-02 DIAGNOSIS — Z0000 Encounter for general adult medical examination without abnormal findings: Secondary | ICD-10-CM

## 2022-12-02 DIAGNOSIS — Z01419 Encounter for gynecological examination (general) (routine) without abnormal findings: Secondary | ICD-10-CM

## 2022-12-02 DIAGNOSIS — E559 Vitamin D deficiency, unspecified: Secondary | ICD-10-CM | POA: Diagnosis not present

## 2022-12-02 DIAGNOSIS — E785 Hyperlipidemia, unspecified: Secondary | ICD-10-CM

## 2022-12-02 DIAGNOSIS — I872 Venous insufficiency (chronic) (peripheral): Secondary | ICD-10-CM

## 2022-12-02 DIAGNOSIS — M170 Bilateral primary osteoarthritis of knee: Secondary | ICD-10-CM

## 2022-12-02 DIAGNOSIS — H33103 Unspecified retinoschisis, bilateral: Secondary | ICD-10-CM

## 2022-12-02 DIAGNOSIS — Z6841 Body Mass Index (BMI) 40.0 and over, adult: Secondary | ICD-10-CM

## 2022-12-02 DIAGNOSIS — E118 Type 2 diabetes mellitus with unspecified complications: Secondary | ICD-10-CM

## 2022-12-02 DIAGNOSIS — N289 Disorder of kidney and ureter, unspecified: Secondary | ICD-10-CM

## 2022-12-02 DIAGNOSIS — M160 Bilateral primary osteoarthritis of hip: Secondary | ICD-10-CM

## 2022-12-02 DIAGNOSIS — E1169 Type 2 diabetes mellitus with other specified complication: Secondary | ICD-10-CM

## 2022-12-02 DIAGNOSIS — I10 Essential (primary) hypertension: Secondary | ICD-10-CM

## 2022-12-02 DIAGNOSIS — I890 Lymphedema, not elsewhere classified: Secondary | ICD-10-CM

## 2022-12-02 MED ORDER — LOSARTAN POTASSIUM-HCTZ 100-12.5 MG PO TABS: 1.0000 | ORAL_TABLET | Freq: Every day | ORAL | 4 refills | Status: DC

## 2022-12-02 MED ORDER — AMLODIPINE BESYLATE 5 MG PO TABS: 5.0000 mg | ORAL_TABLET | Freq: Every day | ORAL | 4 refills | Status: DC

## 2022-12-02 NOTE — Progress Notes (Signed)
Ph: [phone] Fax: [phone]   Patient ID: Leah Peterson, female    DOB: [birth year], 67 y.o.   MRN: [id number]  This visit was conducted in person.  BP 138/86   Pulse 62   Temp (!) 97.3 F (36.3 C) (Temporal)   Ht 5' 0.5" (1.537 m)   Wt 280 lb 2 oz (127.1 kg)   SpO2 98%   BMI 53.81 kg/m    CC: CPE Subjective:   HPI: Leah Peterson is a 67 y.o. female presenting on 12/02/2022 for Annual Exam   Remains active at church, work, and with granddaughter.    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    Ongoing left knee and hip pain. Has been recommended bariatric surgery (goal BMI <40) prior to L knee replacement for known knee osteoarthritis. Hesitant for bariatric surgery in abdominal history, continues working on diet/lifestyle instead.     Prediabetes - metformin 500mg  stopped 10/2020 due to longstanding good control.   Notes left > right leg/foot swelling for the past week. No increased pain, redness, warmth. Occ electrical shock to R thigh.   Preventative: COLONOSCOPY Date: 06/30/2015 diverticulosis, rpt 10 yrs Leone Payor) Pap smear - always normal, last done 10/2018 - normal but absent ECC zone. No pelvic pain or vaginal bleeding. repeat today.  LMP 10/2007. No post menopausal sxs. [facility]  Lung cancer screening - not eligible  Flu shot yearly J&J vaccine 07/2019, Moderna booster 03/2020, Pfizer 01/2022  Tetanus 2010. Tdap 10/2018 Pneumovax 07/2013  RSV vaccine - to consider  Shingrix - 12/2018, 03/2019  Seat belt use discussed  Sunscreen use discussed. No suspicious moles on skin.  Ex smoker - quit remotely  Alcohol - seldom  Dentist - Q6 mo  Eye exam - Q6 mo for borderline glaucoma, sees retina specialist [name]) yearly for retinoschisis   Caffeine: hot tea in the  morning  Lives with daughter and grand daughter. No pets.   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 Continues intermittent fasting.      Relevant past medical, surgical, family and social history reviewed and updated as indicated. Interim medical history since our last visit reviewed. Allergies and medications reviewed and updated. Outpatient Medications Prior to Visit  Medication Sig Dispense Refill   Acetaminophen (TYLENOL ARTHRITIS EXT RELIEF PO) Take by mouth.     cetirizine (ZYRTEC) 10 MG tablet Take 10 mg by mouth at bedtime as needed for allergies.     Cholecalciferol (VITAMIN D) 2000 units CAPS Take 1 capsule (2,000 Units total) by mouth daily. 30 capsule    latanoprost (XALATAN) 0.005 % ophthalmic solution Place 1 drop into both eyes at bedtime.   0   Multiple Vitamin (MULTIVITAMIN WITH MINERALS) TABS tablet Take 1 tablet by mouth daily.     timolol (TIMOPTIC) 0.5 % ophthalmic solution INSTILL 1 DROP INTO BOTH EYES EVERY DAY     trolamine salicylate (ASPERCREME) 10 % cream Apply 1 application topically as needed (for knee pain).      TURMERIC-GINGER PO Take by mouth. Takes 3 times a week     UNABLE TO FIND Med Name: Joint support condroitin     amLODipine (NORVASC) 5 MG tablet Take 1 tablet (5 mg total) by mouth daily. 90 tablet 3  losartan-hydrochlorothiazide (HYZAAR) 100-12.5 MG tablet Take 1 tablet by mouth daily. 90 tablet 1   No facility-administered medications prior to visit.     Per HPI unless specifically indicated in ROS section below Review of Systems  Constitutional:  Negative for activity change, appetite change, chills, fatigue, fever and unexpected weight change.  HENT:  Negative for hearing loss.   Eyes:  Negative for visual disturbance.  Respiratory:  Negative for cough, chest tightness, shortness of breath and wheezing.   Cardiovascular:  Positive  for leg swelling (chronic). Negative for chest pain and palpitations.  Gastrointestinal:  Negative for abdominal distention, abdominal pain, blood in stool, constipation, diarrhea, nausea and vomiting.  Genitourinary:  Negative for difficulty urinating and hematuria.  Musculoskeletal:  Negative for arthralgias, myalgias and neck pain.  Skin:  Negative for rash.  Neurological:  Negative for dizziness, seizures, syncope and headaches.  Hematological:  Negative for adenopathy. Does not bruise/bleed easily.  Psychiatric/Behavioral:  Negative for dysphoric mood. The patient is not nervous/anxious.     Objective:  BP 138/86   Pulse 62   Temp (!) 97.3 F (36.3 C) (Temporal)   Ht 5' 0.5" (1.537 m)   Wt 280 lb 2 oz (127.1 kg)   SpO2 98%   BMI 53.81 kg/m   Wt Readings from Last 3 Encounters:  12/02/22 280 lb 2 oz (127.1 kg)  06/03/22 280 lb 6 oz (127.2 kg)  11/30/21 286 lb 4 oz (129.8 kg)      Physical Exam Vitals and nursing note reviewed. Exam conducted with a chaperone present.  Constitutional:      Appearance: Normal appearance. She is not ill-appearing.  HENT:     Head: Normocephalic and atraumatic.     Right Ear: Tympanic membrane, ear canal and external ear normal. There is no impacted cerumen.     Left Ear: Tympanic membrane, ear canal and external ear normal. There is no impacted cerumen.     Mouth/Throat:     Mouth: Mucous membranes are moist.     Pharynx: Oropharynx is clear. No oropharyngeal exudate or posterior oropharyngeal erythema.  Eyes:     General:        Right eye: No discharge.        Left eye: No discharge.     Extraocular Movements: Extraocular movements intact.     Conjunctiva/sclera: Conjunctivae normal.     Pupils: Pupils are equal, round, and reactive to light.  Neck:     Thyroid: No thyroid mass or thyromegaly.     Vascular: No carotid bruit.  Cardiovascular:     Rate and Rhythm: Normal rate and regular rhythm.     Pulses: Normal pulses.     Heart  sounds: Normal heart sounds. No murmur heard. Pulmonary:     Effort: Pulmonary effort is normal. No respiratory distress.     Breath sounds: Normal breath sounds. No wheezing, rhonchi or rales.  Abdominal:     General: Bowel sounds are normal. There is no distension.     Palpations: Abdomen is soft. There is no mass.     Tenderness: There is no abdominal tenderness. There is no guarding or rebound.     Hernia: No hernia is present.  Genitourinary:    Exam position: Supine.     Pubic Area: No rash.      Labia:        Right: No rash, tenderness or lesion.        Left: No rash, tenderness or lesion.  Urethra: No prolapse.     Vagina: Vaginal discharge (thick white) present.     Cervix: Normal.     Uterus: Normal.      Adnexa: Right adnexa normal and left adnexa normal.     Comments: Cervix was unable to be identified using large speculum, pap not performed Musculoskeletal:        General: Swelling present.     Cervical back: Normal range of motion and neck supple. No rigidity.     Right lower leg: Edema present.     Left lower leg: Edema present.     Comments:  Chronic bilateral lymphedema Neg stemmer's sign  Lymphadenopathy:     Cervical: No cervical adenopathy.  Skin:    General: Skin is warm and dry.     Findings: No rash.  Neurological:     General: No focal deficit present.     Mental Status: She is alert. Mental status is at baseline.  Psychiatric:        Mood and Affect: Mood normal.        Behavior: Behavior normal.       Results for orders placed or performed in visit on 11/25/22  IBC panel  Result Value Ref Range   Iron 73 42 - 145 ug/dL   Transferrin 409.8 119.1 - 360.0 mg/dL   Saturation Ratios 47.8 20.0 - 50.0 %   TIBC 298.2 250.0 - 450.0 mcg/dL  VITAMIN D 25 Hydroxy (Vit-D Deficiency, Fractures)  Result Value Ref Range   VITD 40.43 30.00 - 100.00 ng/mL  Microalbumin / creatinine urine ratio  Result Value Ref Range   Microalb, Ur 1.0 0.0 - 1.9  mg/dL   Creatinine,U 295.6 mg/dL   Microalb Creat Ratio 0.9 0.0 - 30.0 mg/g  Hemoglobin A1c  Result Value Ref Range   Hgb A1c MFr Bld 5.9 4.6 - 6.5 %  Comprehensive metabolic panel  Result Value Ref Range   Sodium 143 135 - 145 mEq/L   Potassium 4.0 3.5 - 5.1 mEq/L   Chloride 106 96 - 112 mEq/L   CO2 27 19 - 32 mEq/L   Glucose, Bld 92 70 - 99 mg/dL   BUN 22 6 - 23 mg/dL   Creatinine, Ser 2.13 0.40 - 1.20 mg/dL   Total Bilirubin 0.6 0.2 - 1.2 mg/dL   Alkaline Phosphatase 45 39 - 117 U/L   AST 15 0 - 37 U/L   ALT 14 0 - 35 U/L   Total Protein 7.4 6.0 - 8.3 g/dL   Albumin 4.3 3.5 - 5.2 g/dL   GFR 08.65 (L) >78.46 mL/min   Calcium 9.7 8.4 - 10.5 mg/dL  Lipid panel  Result Value Ref Range   Cholesterol 194 0 - 200 mg/dL   Triglycerides 96.2 0.0 - 149.0 mg/dL   HDL 95.28 >41.32 mg/dL   VLDL 44.0 0.0 - 10.2 mg/dL   LDL Cholesterol 725 (H) 0 - 99 mg/dL   Total CHOL/HDL Ratio 3    NonHDL 130.37    Lab Results  Component Value Date   WBC 4.1 05/27/2022   HGB 11.5 (L) 05/27/2022   HCT 35.0 (L) 05/27/2022   MCV 88.5 05/27/2022   PLT 214.0 05/27/2022    Lab Results  Component Value Date   TSH 2.09 11/25/2019    Lab Results  Component Value Date   VITAMINB12 1,065 (H) 11/25/2019   Assessment & Plan:   Problem List Items Addressed This Visit     Health maintenance examination - Primary (  Chronic)    Preventative protocols reviewed and updated unless pt declined. Discussed healthy diet and lifestyle.       Encounter for gynecological examination (Chronic)    Unable to visualize cervix - vaginal pap performed. Consider rpt next year using bariatric exam table.      Relevant Orders   Cytology - PAP   Vitamin D deficiency    Levels stable on 2000 international units vit D daily.       Hyperlipidemia associated with type 2 diabetes mellitus (HCC)    Chronic, off statin. Reviewed diet choices to improve LDL cholesterol levels.  The 10-year ASCVD risk score (Arnett DK,  et al., 2019) is: 21.6%   Values used to calculate the score:     Age: 55 years     Sex: Female     Is Non-Hispanic African American: Yes     Diabetic: Yes     Tobacco smoker: No     Systolic Blood Pressure: 138 mmHg     Is BP treated: Yes     HDL Cholesterol: 63.5 mg/dL     Total Cholesterol: 194 mg/dL       Relevant Medications   amLODipine (NORVASC) 5 MG tablet   losartan-hydrochlorothiazide (HYZAAR) 100-12.5 MG tablet   Morbid obesity with BMI of 50.0-59.9, adult (HCC)    Continue to encourage healthy diet and lifestyle choices to affect sustainable weight loss.  She has lost 20 pounds in the past 2 years. She is hesitant to try bariatric medication or surgery given h/o abdominal surgeries.       Relevant Orders   Ambulatory referral to Orthopedic Surgery   Primary hypertension    Chronic, stable on current regimen - continue      Relevant Medications   amLODipine (NORVASC) 5 MG tablet   losartan-hydrochlorothiazide (HYZAAR) 100-12.5 MG tablet   Degenerative arthritis of knee, bilateral    Severe L>R. Activity limiting.  She is interested in surgical intervention, aware of the importance for weight loss prior.  Requests orthopedic evaluation-will refer in [place].      Relevant Orders   Ambulatory referral to Orthopedic Surgery   Controlled diabetes mellitus type 2 with complications (HCC)    Well controlled after 20 lb weight loss since 2022. Remains in prediabetes range.       Relevant Medications   losartan-hydrochlorothiazide (HYZAAR) 100-12.5 MG tablet   Lymphedema   Retinoschisis, bilateral    Followed by retinologist Dr [name].       Chronic venous insufficiency   Relevant Medications   amLODipine (NORVASC) 5 MG tablet   losartan-hydrochlorothiazide (HYZAAR) 100-12.5 MG tablet   Bilateral primary osteoarthritis of hip   Renal insufficiency    Latest GFR 57. Encouraged good hydration status, avoiding NSAIDs and other nephrotoxic agents         Meds ordered this encounter  Medications   amLODipine (NORVASC) 5 MG tablet    Sig: Take 1 tablet (5 mg total) by mouth daily.    Dispense:  90 tablet    Refill:  4   losartan-hydrochlorothiazide (HYZAAR) 100-12.5 MG tablet    Sig: Take 1 tablet by mouth daily.    Dispense:  90 tablet    Refill:  4    Orders Placed This Encounter  Procedures   Ambulatory referral to Orthopedic Surgery    Referral Priority:   Routine    Referral Type:   Surgical    Referral Reason:   Specialty Services Required    Requested Specialty:  Orthopedic Surgery    Number of Visits Requested:   1    Patient Instructions  Check with pharmacy about RSV shot.  Continue current medicines You are doing well today Return as needed or in 6-12 months for welcome to medicare visit   Follow up plan: Return in about 1 year (around 12/02/2023), or if symptoms worsen or fail to improve, for annual exam, prior fasting for blood work.  Eustaquio Boyden, MD

## 2022-12-02 NOTE — Patient Instructions (Addendum)
Check with pharmacy about RSV shot.  Continue current medicines You are doing well today Return as needed or in 6-12 months for welcome to medicare visit

## 2022-12-02 NOTE — Assessment & Plan Note (Signed)
Levels stable on 2000 international units vit D daily.

## 2022-12-02 NOTE — Assessment & Plan Note (Signed)
Latest GFR 57. Encouraged good hydration status, avoiding NSAIDs and other nephrotoxic agents

## 2022-12-02 NOTE — Assessment & Plan Note (Signed)
Well controlled after 20 lb weight loss since 2022. Remains in prediabetes range.

## 2022-12-02 NOTE — Assessment & Plan Note (Addendum)
Severe L>R. Activity limiting.  She is interested in surgical intervention, aware of the importance for weight loss prior.  Requests orthopedic evaluation-will refer in [place].

## 2022-12-02 NOTE — Assessment & Plan Note (Signed)
Chronic, stable on current regimen - continue. 

## 2022-12-02 NOTE — Assessment & Plan Note (Signed)
Followed by retinologist Dr [name].

## 2022-12-02 NOTE — Assessment & Plan Note (Addendum)
Continue to encourage healthy diet and lifestyle choices to affect sustainable weight loss.  She has lost 20 pounds in the past 2 years. She is hesitant to try bariatric medication or surgery given h/o abdominal surgeries.

## 2022-12-02 NOTE — Assessment & Plan Note (Signed)
Unable to visualize cervix - vaginal pap performed. Consider rpt next year using bariatric exam table.

## 2022-12-02 NOTE — Assessment & Plan Note (Signed)
Preventative protocols reviewed and updated unless pt declined. Discussed healthy diet and lifestyle.  

## 2022-12-02 NOTE — Assessment & Plan Note (Signed)
Chronic, off statin. Reviewed diet choices to improve LDL cholesterol levels.  The 10-year ASCVD risk score (Arnett DK, et al., 2019) is: 21.6%   Values used to calculate the score:     Age: 67 years     Sex: Female     Is Non-Hispanic African American: Yes     Diabetic: Yes     Tobacco smoker: No     Systolic Blood Pressure: 138 mmHg     Is BP treated: Yes     HDL Cholesterol: 63.5 mg/dL     Total Cholesterol: 194 mg/dL

## 2022-12-06 ENCOUNTER — Encounter: Payer: BC Managed Care – PPO | Admitting: Family Medicine

## 2022-12-13 ENCOUNTER — Other Ambulatory Visit (INDEPENDENT_AMBULATORY_CARE_PROVIDER_SITE_OTHER): Payer: BC Managed Care – PPO

## 2022-12-13 ENCOUNTER — Ambulatory Visit: Payer: BC Managed Care – PPO | Admitting: Physician Assistant

## 2022-12-13 VITALS — Ht 61.0 in | Wt 286.0 lb

## 2022-12-13 DIAGNOSIS — M1612 Unilateral primary osteoarthritis, left hip: Secondary | ICD-10-CM

## 2022-12-13 MED ORDER — BUPIVACAINE HCL 0.25 % IJ SOLN
2.0000 mL | INTRAMUSCULAR | Status: AC | PRN
Start: 2022-12-13 — End: 2022-12-13
  Administered 2022-12-13: 2 mL via INTRA_ARTICULAR

## 2022-12-13 MED ORDER — METHYLPREDNISOLONE ACETATE 40 MG/ML IJ SUSP
40.0000 mg | INTRAMUSCULAR | Status: AC | PRN
Start: 2022-12-13 — End: 2022-12-13
  Administered 2022-12-13: 40 mg via INTRA_ARTICULAR

## 2022-12-13 MED ORDER — LIDOCAINE HCL 1 % IJ SOLN
2.0000 mL | INTRAMUSCULAR | Status: AC | PRN
Start: 2022-12-13 — End: 2022-12-13
  Administered 2022-12-13: 2 mL

## 2022-12-13 NOTE — Progress Notes (Signed)
Office Visit Note   Patient: Leah Peterson           Date of Birth: 1956/02/26           MRN: [id number] Visit Date: 12/13/2022              Requested by: Eustaquio Boyden, MD [address] [place],  [place] [address] PCP: Eustaquio Boyden, MD   Assessment & Plan: Visit Diagnoses:  1. Unilateral primary osteoarthritis, left hip     Plan: Impression is advanced degenerative joint disease left hip and left knee.  At this point, we have discussed various treatment options to include cortisone injection versus joint replacement surgery.  Unfortunately, the patient currently has a BMI of 54.  She will need to get down to a weight of 210 pounds in order to attain a BMI of less than 40 to proceed with surgery.  She understands and agrees.  She is elected to proceed with left knee cortisone injection today.  Will refer her to Dr. Shon Baton for left hip cortisone injection.  Follow-up as needed.    Follow-Up Instructions: Return if symptoms worsen or fail to improve.   Orders:  Orders Placed This Encounter  Procedures   Large Joint Inj   XR HIP UNILAT W OR W/O PELVIS 2-3 VIEWS LEFT   XR KNEE 3 VIEW LEFT   No orders of the defined types were placed in this encounter.     Procedures: Large Joint Inj: L knee on 12/13/2022 4:03 PM Indications: pain Details: 22 G needle, anterolateral approach Medications: 2 mL lidocaine 1 %; 2 mL bupivacaine 0.25 %; 40 mg methylPREDNISolone acetate 40 MG/ML      Clinical Data: No additional findings.   Subjective: Chief Complaint  Patient presents with   Left Knee - Pain    HPI patient is a pleasant 67 year old female who comes in today with left leg pain for the past several years which has progressively worsened.  The pain she has starts in the groin and radiates to the knee.  Symptoms are worse when she is lying down as well as when she is getting out of the car and really any movement that she makes.  She does not have pain  at rest.  She has been taking ibuprofen without significant relief.  She does also note tingling to the right leg.  No tingling or weakness to the left leg.  No previous hip injection.  No history of lumbar spine pathology.  Review of Systems as detailed in HPI.  All others reviewed and are negative.   Objective: Vital Signs: Ht 5\' 1"  (1.549 m)   Wt 286 lb (129.7 kg)   BMI 54.04 kg/m   Physical Exam well-developed well-nourished female no acute distress.  Alert and oriented x 3.  Ortho Exam left hip exam reveals marked pain with logroll and FADIR testing.  She is unable to actively flex her hip.  Left knee exam shows range of motion of 0 to 95 degrees.  No joint line tenderness.  Moderate patellofemoral crepitus.  She is neurovascularly intact distally.  Specialty Comments:  No specialty comments available.  Imaging: XR HIP UNILAT W OR W/O PELVIS 2-3 VIEWS LEFT  Result Date: 12/13/2022 X-rays demonstrate bone-on-bone left hip joint  XR KNEE 3 VIEW LEFT  Result Date: 12/13/2022 X-rays demonstrate advanced tricompartmental degenerative changes    PMFS History: Patient Active Problem List   Diagnosis Date Noted   Encounter for gynecological examination 12/02/2022  Anemia 11/24/2022   Darkening of skin 06/03/2022   Renal insufficiency 11/30/2021   POAG (primary open-angle glaucoma) 05/31/2021   Chronic venous insufficiency 05/28/2021   Bilateral primary osteoarthritis of hip 05/28/2021   Retinoschisis, bilateral 05/29/2020   Lymphedema 11/25/2019   Diverticular disease 03/30/2017   Uterine leiomyoma 11/16/2016   Glaucoma 09/30/2016   Diastasis recti 04/18/2016   Bilateral leg paresthesia 04/10/2015   Onychomycosis 01/09/2015   History of DVT of lower extremity 10/16/2013   Intertrigo 09/04/2013   History of diverticulitis 07/31/2013   Health maintenance examination 09/14/2011   Vitamin D deficiency 09/02/2009   Degenerative arthritis of knee, bilateral 09/02/2009    Controlled diabetes mellitus type 2 with complications (HCC) 12/31/2008   Primary hypertension 06/18/2007   Hyperlipidemia associated with type 2 diabetes mellitus (HCC) 06/12/2007   Morbid obesity with BMI of 50.0-59.9, adult (HCC) 06/11/2007   Past Medical History:  Diagnosis Date   Abscess of abdominal cavity (HCC) 10/14/2013   Arthritis    KNEES   Cyst of left ovary    complex L ovarian mass due to inclusion cyst 2/2 diverticulitis s/p L salpingectomy 11/2017, then subsequent sigmoidectomy and BSO 03/2017.      Diverticulitis of large intestine without perforation or abscess without bleeding 01/16/2017   Diverticulosis of colon (without mention of hemorrhage)    DVT (deep venous thrombosis) (HCC) 2015   bilateral posterior tibial veins treated with IVC filter   Essential hypertension, benign    Blood pressure tends to elevate with MD visits.   Gallstones    Glaucoma 09/2016   left sided mainly   History of diverticulitis    IBS (irritable bowel syndrome)    Left knee pain 2012   eval by ortho - bone spurs and DJD, treated with steroid shot which helped temporarily   Liver abscess 2015   hospitalization for severe septic shock after diverticulitis   Obesity, unspecified    Other abnormal glucose    Ovarian mass    Pure hypercholesterolemia    Transfusion history    '15-"diverticulosis"   Unspecified vitamin D deficiency    resolved    Family History  Problem Relation Age of Onset   Hypertension Father    Cancer Father        prostate   Heart disease Father        CABG x 2   Cancer Mother        Lung-quit smoking 20 years prior to Dx   Hypertension Mother    Drug abuse Brother    Diabetes Other        + family hx   Stroke Maternal Grandmother    Sleep apnea Brother        with [employer] estranged   Colon cancer Neg Hx     Past Surgical History:  Procedure Laterality Date   CESAREAN SECTION     due to BP elevation AND BABY HEART RATE STOPPING   COLONOSCOPY  2000    Diverticuli-Dr. Jarold Motto   COLONOSCOPY WITH PROPOFOL N/A 06/30/2015   diverticulosis, rpt 10 yrs Iva Boop, MD)   CYSTOSCOPY WITH STENT PLACEMENT Bilateral 03/30/2017   Procedure: CYSTOSCOPY WITH Daryel Gerald;  Surgeon: Bjorn Pippin, MD;  Location: [facility];  Service: Urology;  Laterality: Bilateral;   DRAINAGE ABD/PERITON ABS PERC ([facility] HX)  2015   liver abscess   ESOPHAGOGASTRODUODENOSCOPY (EGD) WITH PROPOFOL N/A 12/30/2015   WNL Iva Boop, MD)   IVC FILTER PLACED AND REMOVED  2015   ROBOTIC ASSISTED BILATERAL SALPINGO OOPHERECTOMY Left 12/27/2016   Attempted XI ROBOTIC ASSISTED LYSIS OF ADHESIONS AND LEFT SALPINGECTOMY;  Adolphus Birchwood, MD   ROBOTIC ASSISTED BILATERAL SALPINGO OOPHERECTOMY Bilateral 03/30/2017   L ovarian cyst, h/o diverticular absces - Procedure: XI ROBOTIC ASSISTED BILATERAL SALPINGO OOPHORECTOMY;  Surgeon: Adolphus Birchwood, MD   Social History   Occupational History   Occupation: Analyst; [employer] of [employer]  Tobacco Use   Smoking status: Former    Current packs/day: 0.00    Types: Cigarettes    Start date: 06/15/1974    Quit date: 06/15/1977    Years since quitting: 45.5   Smokeless tobacco: Never  Vaping Use   Vaping status: Never Used  Substance and Sexual Activity   Alcohol use: Yes    Comment: wine on occasion    Drug use: No   Sexual activity: Never    Birth control/protection: Post-menopausal    Comment: By choice

## 2022-12-13 NOTE — Addendum Note (Signed)
Addended by: Wendi Maya on: 12/13/2022 04:10 PM   Modules accepted: Orders

## 2022-12-20 ENCOUNTER — Encounter: Payer: Self-pay | Admitting: Sports Medicine

## 2022-12-20 ENCOUNTER — Ambulatory Visit: Payer: BC Managed Care – PPO | Admitting: Sports Medicine

## 2022-12-20 ENCOUNTER — Telehealth: Payer: Self-pay | Admitting: Physician Assistant

## 2022-12-20 ENCOUNTER — Other Ambulatory Visit: Payer: Self-pay

## 2022-12-20 DIAGNOSIS — M1612 Unilateral primary osteoarthritis, left hip: Secondary | ICD-10-CM | POA: Diagnosis not present

## 2022-12-20 MED ORDER — LIDOCAINE HCL 1 % IJ SOLN
4.0000 mL | INTRAMUSCULAR | Status: AC | PRN
Start: 2022-12-20 — End: 2022-12-20
  Administered 2022-12-20: 4 mL

## 2022-12-20 MED ORDER — METHYLPREDNISOLONE ACETATE 40 MG/ML IJ SUSP
40.0000 mg | INTRAMUSCULAR | Status: AC | PRN
Start: 2022-12-20 — End: 2022-12-20
  Administered 2022-12-20: 40 mg via INTRA_ARTICULAR

## 2022-12-20 MED ORDER — CELECOXIB 200 MG PO CAPS: 200.0000 mg | ORAL_CAPSULE | Freq: Two times a day (BID) | ORAL | 3 refills | Status: DC

## 2022-12-20 NOTE — Telephone Encounter (Signed)
Done

## 2022-12-20 NOTE — Telephone Encounter (Signed)
Called and notified patient via voicemail.  

## 2022-12-20 NOTE — Telephone Encounter (Signed)
Patient is here for an appointment. Says Leah Peterson did not call in the anti inflammatory medication for her. She would like something called in for her. Her cb# [phone]

## 2022-12-20 NOTE — Progress Notes (Signed)
   Procedure Note  Patient: Ayvree Ricker             Date of Birth: 1956-02-23           MRN: [id number]             Visit Date: 12/20/2022  Procedures: Visit Diagnoses:  1. Unilateral primary osteoarthritis, left hip    Large Joint Inj: L hip joint on 12/20/2022 1:07 PM Indications: pain Details: 22 G 3.5 in needle, ultrasound-guided anterior approach Medications: 4 mL lidocaine 1 %; 40 mg methylPREDNISolone acetate 40 MG/ML Outcome: tolerated well, no immediate complications  Procedure: US-guided intra-articular hip injection, left After discussion on risks/benefits/indications and informed verbal consent was obtained, a timeout was performed. Patient was lying supine on exam table. The hip was cleaned with betadine and alcohol swabs. Then utilizing ultrasound guidance, the patient's femoral head and neck junction was identified and subsequently injected with 4:1 lidocaine:depomedrol via an in-plane approach with ultrasound visualization of the injectate administered into the hip joint. Patient tolerated procedure well without immediate complications.  Procedure, treatment alternatives, risks and benefits explained, specific risks discussed. Consent was given by the patient. Immediately prior to procedure a time out was called to verify the correct patient, procedure, equipment, support staff and site/side marked as required. Patient was prepped and draped in the usual sterile fashion.    - I evaluated the patient about 5 minutes post-injection and she had improvement in pain already - follow-up with Mikey Kirschner or Dr. Roda Shutters as indicated; I am happy to see them as needed  Madelyn Brunner, DO Primary Care Sports Medicine Physician  [facility] - Orthopedics  This note was dictated using Dragon naturally speaking software and may contain errors in syntax, spelling, or content which have not been identified prior to signing this note.

## 2023-01-03 LAB — HM DIABETES EYE EXAM

## 2023-02-16 ENCOUNTER — Encounter: Payer: Self-pay | Admitting: Family Medicine

## 2023-03-15 ENCOUNTER — Ambulatory Visit (INDEPENDENT_AMBULATORY_CARE_PROVIDER_SITE_OTHER): Payer: BC Managed Care – PPO

## 2023-03-15 DIAGNOSIS — Z23 Encounter for immunization: Secondary | ICD-10-CM | POA: Diagnosis not present

## 2023-03-15 NOTE — Progress Notes (Signed)
Per orders of Dr. Eustaquio Boyden, injection of fluad high dose 65+ given by Lewanda Rife in right deltoid. Patient tolerated injection well. Pt waited 10 mins since first high dose flu shot but no problems noted.

## 2023-03-24 NOTE — Telephone Encounter (Signed)
Care team updated and letter sent for eye exam notes.

## 2023-06-05 ENCOUNTER — Encounter: Payer: Self-pay | Admitting: Family Medicine

## 2023-06-05 ENCOUNTER — Ambulatory Visit: Payer: 59 | Admitting: Family Medicine

## 2023-06-05 VITALS — BP 138/86 | HR 86 | Temp 97.9°F | Ht 60.5 in | Wt 278.5 lb

## 2023-06-05 DIAGNOSIS — Z0000 Encounter for general adult medical examination without abnormal findings: Secondary | ICD-10-CM

## 2023-06-05 DIAGNOSIS — H401130 Primary open-angle glaucoma, bilateral, stage unspecified: Secondary | ICD-10-CM

## 2023-06-05 DIAGNOSIS — I890 Lymphedema, not elsewhere classified: Secondary | ICD-10-CM

## 2023-06-05 DIAGNOSIS — I872 Venous insufficiency (chronic) (peripheral): Secondary | ICD-10-CM

## 2023-06-05 DIAGNOSIS — E1169 Type 2 diabetes mellitus with other specified complication: Secondary | ICD-10-CM

## 2023-06-05 DIAGNOSIS — Z6841 Body Mass Index (BMI) 40.0 and over, adult: Secondary | ICD-10-CM

## 2023-06-05 DIAGNOSIS — E7800 Pure hypercholesterolemia, unspecified: Secondary | ICD-10-CM | POA: Diagnosis not present

## 2023-06-05 DIAGNOSIS — H33103 Unspecified retinoschisis, bilateral: Secondary | ICD-10-CM

## 2023-06-05 DIAGNOSIS — E559 Vitamin D deficiency, unspecified: Secondary | ICD-10-CM

## 2023-06-05 DIAGNOSIS — Z7189 Other specified counseling: Secondary | ICD-10-CM | POA: Diagnosis not present

## 2023-06-05 DIAGNOSIS — I10 Essential (primary) hypertension: Secondary | ICD-10-CM

## 2023-06-05 DIAGNOSIS — M170 Bilateral primary osteoarthritis of knee: Secondary | ICD-10-CM

## 2023-06-05 DIAGNOSIS — M160 Bilateral primary osteoarthritis of hip: Secondary | ICD-10-CM

## 2023-06-05 DIAGNOSIS — R9431 Abnormal electrocardiogram [ECG] [EKG]: Secondary | ICD-10-CM

## 2023-06-05 DIAGNOSIS — E2839 Other primary ovarian failure: Secondary | ICD-10-CM

## 2023-06-05 NOTE — Assessment & Plan Note (Signed)
 Chronic, stable. Continue current regimen.

## 2023-06-05 NOTE — Assessment & Plan Note (Deleted)
Appreciate ophtho care.  

## 2023-06-05 NOTE — Assessment & Plan Note (Signed)
Continue 2000 international units daily

## 2023-06-05 NOTE — Progress Notes (Signed)
Ph: [phone] Fax: [phone]   Patient ID: Leah Peterson, female    DOB: 13-Oct-1955, 68 y.o.   MRN: [id number]  This visit was conducted in person.  BP 138/86   Pulse 86   Temp 97.9 F (36.6 C) (Oral)   Ht 5' 0.5" (1.537 m)   Wt 278 lb 8 oz (126.3 kg)   SpO2 98%   BMI 53.50 kg/m    CC: welcome to medicare visit  Subjective:   HPI: Leah Peterson is a 68 y.o. female presenting on 06/05/2023 for Welcome to Medicare Exam (C/o runny nose. )   Hearing Screening   500Hz  1000Hz  2000Hz  4000Hz   Right ear 25 40 20 20  Left ear 20 40 20 20   Vision Screening   Right eye Left eye Both eyes  Without correction 20/25 20/25 20/25   With correction       Flowsheet Row Office Visit from [facility]/03/2022 [facility] [place]  PHQ-2 Total Score 0          06/03/2022   10:47 AM 12/12/2019    3:35 PM 11/06/2013   10:27 AM 10/02/2013   11:01 AM 08/27/2013    4:14 PM  Fall Risk   Falls in the past year? 0 1 No No No  Number falls in past yr:  1     Comment  fell after standing up from bench, numbness and tingling in legs     Injury with Fall?  0     Follow up  Falls evaluation completed     8 lb weight loss in the past 6 months.   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    Known L hip and knee osteoarthritis followed by Candiss Norse and has been recommended bariatric surgery (goal BMI <40) prior to L knee replacement. she remains hesitant for bariatric surgery in abdominal surgical history.  Last received L hip steroid injection 12/2022 by Dr Shon Baton. Most recently stated on celebrex 200mg  daily. Continue ambulating with cane.   Preventative: COLONOSCOPY Date: 06/30/2015 diverticulosis, rpt 10 yrs Leone Payor) Pap smear - always normal, last 12/2022 - unable to get good cervical sample but otherwise  normal with neg HR-HPV. Could consider rpt 1-2 yrs on electrical exam table due to limited knee mobility. No pelvic pain or vaginal bleeding.  LMP 10/2007. No post menopausal sxs. [facility]  DEXA scan - ordered today  Lung cancer screening - not eligible  Flu shot yearly J&J vaccine 07/2019, Moderna booster 03/2020, Pfizer 01/2022  Tetanus 2010. Tdap 10/2018 Pneumovax 07/2013  RSV vaccine - to consider  Shingrix - 12/2018, 03/2019  Advanced directive - discussed. Has not set up but working on this - to use 5 wishes. Daughter [name] would be HCPOA. Full code but no prolonged life support if terminal condition.  Seat belt use discussed  Sunscreen use discussed. No suspicious moles on skin.  Ex smoker - quit remotely  Alcohol - seldom  Dentist - Q6 mo  Eye exam - Q6 mo for borderline glaucoma, sees retina specialist [name]) yearly for retinoschisis (detached retina)   Caffeine: hot tea in the morning  Lives with daughter and grand daughter. No pets.   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 Continues intermittent fasting.      Relevant past medical, surgical, family and social history reviewed and updated as indicated. Interim medical history since our last visit reviewed. Allergies and medications reviewed and updated. Outpatient Medications Prior to Visit  Medication Sig Dispense Refill   Acetaminophen (TYLENOL ARTHRITIS EXT RELIEF PO) Take by mouth.     amLODipine (NORVASC) 5 MG tablet Take 1 tablet (5 mg total) by mouth daily. 90 tablet 4   celecoxib (CELEBREX) 200 MG capsule Take 1 capsule (200 mg total) by mouth 2 (two) times daily. 30 capsule 3   cetirizine (ZYRTEC) 10 MG tablet Take 10 mg by mouth at bedtime as needed for allergies.     Cholecalciferol (VITAMIN D) 2000 units CAPS Take 1 capsule (2,000 Units total) by mouth daily. 30  capsule    latanoprost (XALATAN) 0.005 % ophthalmic solution Place 1 drop into both eyes at bedtime.   0   losartan-hydrochlorothiazide (HYZAAR) 100-12.5 MG tablet Take 1 tablet by mouth daily. 90 tablet 4   Multiple Vitamin (MULTIVITAMIN WITH MINERALS) TABS tablet Take 1 tablet by mouth daily.     timolol (TIMOPTIC) 0.5 % ophthalmic solution INSTILL 1 DROP INTO BOTH EYES EVERY DAY     trolamine salicylate (ASPERCREME) 10 % cream Apply 1 application topically as needed (for knee pain).      UNABLE TO FIND Med Name: Joint support condroitin     TURMERIC-GINGER PO Take by mouth. Takes 3 times a week     No facility-administered medications prior to visit.     Per HPI unless specifically indicated in ROS section below Review of Systems  Objective:  BP 138/86   Pulse 86   Temp 97.9 F (36.6 C) (Oral)   Ht 5' 0.5" (1.537 m)   Wt 278 lb 8 oz (126.3 kg)   SpO2 98%   BMI 53.50 kg/m   Wt Readings from Last 3 Encounters:  06/05/23 278 lb 8 oz (126.3 kg)  12/13/22 286 lb (129.7 kg)  12/02/22 280 lb 2 oz (127.1 kg)      Physical Exam Vitals and nursing note reviewed.  Constitutional:      Appearance: Normal appearance. She is not ill-appearing.  HENT:     Head: Normocephalic and atraumatic.     Right Ear: Tympanic membrane, ear canal and external ear normal. There is no impacted cerumen.     Left Ear: Tympanic membrane, ear canal and external ear normal. There is no impacted cerumen.  Eyes:     General:        Right eye: No discharge.        Left eye: No discharge.     Extraocular Movements: Extraocular movements intact.     Conjunctiva/sclera: Conjunctivae normal.     Pupils: Pupils are equal, round, and reactive to light.  Neck:     Thyroid: No thyroid mass or thyromegaly.     Vascular: No carotid bruit.  Cardiovascular:     Rate and Rhythm: Normal rate and regular rhythm.     Pulses: Normal pulses.     Heart sounds: Normal heart sounds. No murmur heard. Pulmonary:      Effort: Pulmonary effort is normal. No respiratory distress.     Breath sounds: Normal breath sounds. No wheezing, rhonchi or rales.  Abdominal:     General: Bowel sounds are normal. There is no distension.     Palpations: Abdomen is soft. There is no mass.     Tenderness: There  is no abdominal tenderness. There is no guarding or rebound.     Hernia: No hernia is present.  Musculoskeletal:     Cervical back: Normal range of motion and neck supple. No rigidity.     Right lower leg: No edema.     Left lower leg: No edema.  Lymphadenopathy:     Cervical: No cervical adenopathy.  Skin:    General: Skin is warm and dry.     Findings: No rash.  Neurological:     General: No focal deficit present.     Mental Status: She is alert. Mental status is at baseline.     Comments:  Recall 3/3  Calculation serial 7s 5/5   Psychiatric:        Mood and Affect: Mood normal.        Behavior: Behavior normal.       Lab Results  Component Value Date   CHOL 194 11/25/2022   HDL 63.50 11/25/2022   LDLCALC 120 (H) 11/25/2022   TRIG 53.0 11/25/2022   CHOLHDL 3 11/25/2022  The 10-year ASCVD risk score (Arnett DK, et al., 2019) is: 22.8%   Values used to calculate the score:     Age: 64 years     Sex: Female     Is Non-Hispanic African American: Yes     Diabetic: Yes     Tobacco smoker: No     Systolic Blood Pressure: 138 mmHg     Is BP treated: Yes     HDL Cholesterol: 63.5 mg/dL     Total Cholesterol: 194 mg/dL  Lab Results  Component Value Date   HGBA1C 5.9 11/25/2022   Results for orders placed or performed in visit on 03/08/23  [facility] DIABETES EYE EXAM   Collection Time: 01/03/23  3:09 PM  Result Value Ref Range   [facility] Diabetic Eye Exam No Retinopathy No Retinopathy   Medicare EKG today: NSR rate 60s,, normal axis, intervals, no hypertrophy or acute ST/T changes, possible Q waves anteriorly  Echocardiogram 2015 - WNL with EF 55-60%  Assessment & Plan:   Problem List Items Addressed  This Visit     Welcome to Medicare preventive visit - Primary (Chronic)   I have personally reviewed the Medicare Annual Wellness questionnaire and have noted 1. The patient's medical and social history 2. Their use of alcohol, tobacco or illicit drugs 3. Their current medications and supplements 4. The patient's functional ability including ADL's, fall risks, home safety risks and hearing or visual impairment. Cognitive function has been assessed and addressed as indicated.  5. Diet and physical activity 6. Evidence for depression or mood disorders The patients weight, height, BMI have been recorded in the chart. I have made referrals, counseling and provided education to the patient based on review of the above and I have provided the pt with a written personalized care plan for preventive services. Provider list updated.. See scanned questionairre as needed for further documentation. Reviewed preventative protocols and updated unless pt declined.       Relevant Orders   EKG 12-Lead (Completed)   Advanced directives, counseling/discussion (Chronic)   Advanced directive - discussed. Has not set up but working on this - to use 5 wishes. Daughter Daesha Insco would be HCPOA. Full code but no prolonged life support if terminal condition.       Vitamin D deficiency   Continue 2000 international units  daily.       Hyperlipidemia   Chronic, mildly elevated, remains off  statin. Reviewed ASCVD, discussed possibility of statin but she is hesitant. Agrees to coronary calcium score for further risk stratification.  The 10-year ASCVD risk score (Arnett DK, et al., 2019) is: 22.8%   Values used to calculate the score:     Age: 22 years     Sex: Female     Is Non-Hispanic African American: Yes     Diabetic: Yes     Tobacco smoker: No     Systolic Blood Pressure: 138 mmHg     Is BP treated: Yes     HDL Cholesterol: 63.5 mg/dL     Total Cholesterol: 194 mg/dL       Relevant Orders    CT CARDIAC SCORING (SELF PAY ONLY)   Morbid obesity with BMI of 50.0-59.9, adult (HCC)   Continue to encourage healthy diet choices. Activity limited by advanced lower extremity osteoarthritis.       Primary hypertension   Chronic, stable. Continue current regimen.       Degenerative arthritis of knee, bilateral   Type 2 diabetes mellitus with other specified complication (HCC)   Chronic, recent A1cs in prediabetes range. Will continue to monitor.       Lymphedema   Retinoschisis, bilateral   Regularly sees retina specialist.       Chronic venous insufficiency   Bilateral primary osteoarthritis of hip   POAG (primary open-angle glaucoma)   Appreciate ophtho care.       Abnormal EKG   Possible q waves anteriorly - will update coronary calcium score as per above.  No known personal or family history of CAD      Relevant Orders   CT CARDIAC SCORING (SELF PAY ONLY)   Other Visit Diagnoses       Estrogen deficiency       Relevant Orders   DG Bone Density        No orders of the defined types were placed in this encounter.   Orders Placed This Encounter  Procedures   CT CARDIAC SCORING (SELF PAY ONLY)    Standing Status:   Future    Expiration Date:   06/04/2024    Preferred imaging location?:   [facility]   DG Bone Density    Standing Status:   Future    Expiration Date:   06/04/2024    Reason for Exam (SYMPTOM  OR DIAGNOSIS REQUIRED):   osteoporosis screen    Preferred imaging location?:   [facility]   EKG 12-Lead    Patient Instructions  I will order bone density scan to be done at [facility].  I will order coronary calcium score for further evaluation of heart risk.  Good to see you today  Return as needed or in 6 months for physical   Follow up plan: Return in about 6 months (around 12/03/2023) for annual exam, prior fasting for blood work.  Eustaquio Boyden, MD

## 2023-06-05 NOTE — Assessment & Plan Note (Addendum)
Possible q waves anteriorly - will update coronary calcium score as per above.  No known personal or family history of CAD

## 2023-06-05 NOTE — Assessment & Plan Note (Signed)
Appreciate ophtho care.  

## 2023-06-05 NOTE — Assessment & Plan Note (Signed)

## 2023-06-05 NOTE — Assessment & Plan Note (Addendum)
Chronic, mildly elevated, remains off statin. Reviewed ASCVD, discussed possibility of statin but she is hesitant. Agrees to coronary calcium score for further risk stratification.  The 10-year ASCVD risk score (Arnett DK, et al., 2019) is: 22.8%   Values used to calculate the score:     Age: 68 years     Sex: Female     Is Non-Hispanic African American: Yes     Diabetic: Yes     Tobacco smoker: No     Systolic Blood Pressure: 138 mmHg     Is BP treated: Yes     HDL Cholesterol: 63.5 mg/dL     Total Cholesterol: 194 mg/dL

## 2023-06-05 NOTE — Assessment & Plan Note (Signed)
Chronic, recent A1cs in prediabetes range. Will continue to monitor.

## 2023-06-05 NOTE — Assessment & Plan Note (Signed)
Regularly sees retina specialist.

## 2023-06-05 NOTE — Patient Instructions (Addendum)
I will order bone density scan to be done at [facility].  I will order coronary calcium score for further evaluation of heart risk.  Good to see you today  Return as needed or in 6 months for physical

## 2023-06-05 NOTE — Assessment & Plan Note (Addendum)
Advanced directive - discussed. Has not set up but working on this - to use 5 wishes. Daughter Leah Peterson would be HCPOA. Full code but no prolonged life support if terminal condition.

## 2023-06-05 NOTE — Assessment & Plan Note (Signed)
Continue to encourage healthy diet choices. Activity limited by advanced lower extremity osteoarthritis.

## 2023-06-13 ENCOUNTER — Ambulatory Visit (HOSPITAL_COMMUNITY)
Admission: RE | Admit: 2023-06-13 | Discharge: 2023-06-13 | Disposition: A | Discharge status: Home / Self Care | Payer: Self-pay | Source: Ambulatory Visit | Attending: Family Medicine | Admitting: Family Medicine

## 2023-06-13 DIAGNOSIS — R9431 Abnormal electrocardiogram [ECG] [EKG]: Secondary | ICD-10-CM | POA: Insufficient documentation

## 2023-06-13 DIAGNOSIS — E7800 Pure hypercholesterolemia, unspecified: Secondary | ICD-10-CM | POA: Insufficient documentation

## 2023-06-14 ENCOUNTER — Encounter: Payer: Self-pay | Admitting: Family Medicine

## 2023-06-15 ENCOUNTER — Other Ambulatory Visit: Payer: Self-pay | Admitting: Family Medicine

## 2023-06-15 ENCOUNTER — Encounter: Payer: Self-pay | Admitting: Family Medicine

## 2023-06-15 DIAGNOSIS — E7800 Pure hypercholesterolemia, unspecified: Secondary | ICD-10-CM

## 2023-06-15 DIAGNOSIS — R931 Abnormal findings on diagnostic imaging of heart and coronary circulation: Secondary | ICD-10-CM | POA: Insufficient documentation

## 2023-06-15 MED ORDER — ASPIRIN 81 MG PO TBEC
81.0000 mg | DELAYED_RELEASE_TABLET | Freq: Every day | ORAL | Status: DC
Start: 2023-06-15 — End: 2023-12-04

## 2023-06-15 MED ORDER — ATORVASTATIN CALCIUM 20 MG PO TABS: 20.0000 mg | ORAL_TABLET | Freq: Every day | ORAL | 3 refills | Status: DC

## 2023-06-15 NOTE — Telephone Encounter (Signed)
Replied via results note  ?

## 2023-07-18 ENCOUNTER — Other Ambulatory Visit: Payer: Self-pay | Admitting: Orthopaedic Surgery

## 2023-10-13 ENCOUNTER — Other Ambulatory Visit: Payer: Self-pay | Admitting: Family Medicine

## 2023-10-13 DIAGNOSIS — Z1231 Encounter for screening mammogram for malignant neoplasm of breast: Secondary | ICD-10-CM

## 2023-10-18 ENCOUNTER — Telehealth (HOSPITAL_BASED_OUTPATIENT_CLINIC_OR_DEPARTMENT_OTHER): Payer: Self-pay

## 2023-10-26 ENCOUNTER — Other Ambulatory Visit (HOSPITAL_BASED_OUTPATIENT_CLINIC_OR_DEPARTMENT_OTHER)

## 2023-11-07 ENCOUNTER — Ambulatory Visit (HOSPITAL_BASED_OUTPATIENT_CLINIC_OR_DEPARTMENT_OTHER)
Admission: RE | Admit: 2023-11-07 | Discharge: 2023-11-07 | Disposition: A | Discharge status: Home / Self Care | Source: Ambulatory Visit | Attending: Family Medicine | Admitting: Family Medicine

## 2023-11-07 DIAGNOSIS — E2839 Other primary ovarian failure: Secondary | ICD-10-CM | POA: Diagnosis present

## 2023-11-13 ENCOUNTER — Ambulatory Visit: Payer: Self-pay | Admitting: Family Medicine

## 2023-11-13 DIAGNOSIS — Z0000 Encounter for general adult medical examination without abnormal findings: Secondary | ICD-10-CM

## 2023-11-24 ENCOUNTER — Ambulatory Visit
Admission: RE | Admit: 2023-11-24 | Discharge: 2023-11-24 | Disposition: A | Discharge status: Home / Self Care | Source: Ambulatory Visit | Attending: Family Medicine | Admitting: Family Medicine

## 2023-11-24 DIAGNOSIS — Z1231 Encounter for screening mammogram for malignant neoplasm of breast: Secondary | ICD-10-CM

## 2023-11-25 ENCOUNTER — Other Ambulatory Visit: Payer: Self-pay | Admitting: Family Medicine

## 2023-11-25 DIAGNOSIS — D649 Anemia, unspecified: Secondary | ICD-10-CM

## 2023-11-25 DIAGNOSIS — E559 Vitamin D deficiency, unspecified: Secondary | ICD-10-CM

## 2023-11-25 DIAGNOSIS — E1169 Type 2 diabetes mellitus with other specified complication: Secondary | ICD-10-CM

## 2023-11-28 ENCOUNTER — Other Ambulatory Visit (INDEPENDENT_AMBULATORY_CARE_PROVIDER_SITE_OTHER): Payer: BC Managed Care – PPO

## 2023-11-28 DIAGNOSIS — E785 Hyperlipidemia, unspecified: Secondary | ICD-10-CM | POA: Diagnosis not present

## 2023-11-28 DIAGNOSIS — E559 Vitamin D deficiency, unspecified: Secondary | ICD-10-CM | POA: Diagnosis not present

## 2023-11-28 DIAGNOSIS — D649 Anemia, unspecified: Secondary | ICD-10-CM | POA: Diagnosis not present

## 2023-11-28 DIAGNOSIS — E1169 Type 2 diabetes mellitus with other specified complication: Secondary | ICD-10-CM

## 2023-11-28 LAB — FERRITIN: Ferritin: 50.8 ng/mL (ref 10.0–291.0)

## 2023-11-28 LAB — CBC WITH DIFFERENTIAL/PLATELET
Basophils Absolute: 0 K/uL (ref 0.0–0.1)
Basophils Relative: 0.5 % (ref 0.0–3.0)
Eosinophils Absolute: 0.1 K/uL (ref 0.0–0.7)
Eosinophils Relative: 1.8 % (ref 0.0–5.0)
HCT: 34.5 % — ABNORMAL LOW (ref 36.0–46.0)
Hemoglobin: 11.3 g/dL — ABNORMAL LOW (ref 12.0–15.0)
Lymphocytes Relative: 51 % — ABNORMAL HIGH (ref 12.0–46.0)
Lymphs Abs: 2 K/uL (ref 0.7–4.0)
MCHC: 32.8 g/dL (ref 30.0–36.0)
MCV: 87.9 fl (ref 78.0–100.0)
Monocytes Absolute: 0.3 K/uL (ref 0.1–1.0)
Monocytes Relative: 7.2 % (ref 3.0–12.0)
Neutro Abs: 1.5 K/uL (ref 1.4–7.7)
Neutrophils Relative %: 39.5 % — ABNORMAL LOW (ref 43.0–77.0)
Platelets: 204 K/uL (ref 150.0–400.0)
RBC: 3.92 Mil/uL (ref 3.87–5.11)
RDW: 14.3 % (ref 11.5–15.5)
WBC: 3.9 K/uL — ABNORMAL LOW (ref 4.0–10.5)

## 2023-11-28 LAB — COMPREHENSIVE METABOLIC PANEL WITH GFR
ALT: 16 U/L (ref 0–35)
AST: 16 U/L (ref 0–37)
Albumin: 4.3 g/dL (ref 3.5–5.2)
Alkaline Phosphatase: 46 U/L (ref 39–117)
BUN: 20 mg/dL (ref 6–23)
CO2: 26 meq/L (ref 19–32)
Calcium: 9.6 mg/dL (ref 8.4–10.5)
Chloride: 106 meq/L (ref 96–112)
Creatinine, Ser: 1.01 mg/dL (ref 0.40–1.20)
GFR: 58.31 mL/min — ABNORMAL LOW (ref >60.00)
Glucose, Bld: 91 mg/dL (ref 70–99)
Potassium: 4.2 meq/L (ref 3.5–5.1)
Sodium: 142 meq/L (ref 135–145)
Total Bilirubin: 0.5 mg/dL (ref 0.2–1.2)
Total Protein: 7.1 g/dL (ref 6.0–8.3)

## 2023-11-28 LAB — LIPID PANEL
Cholesterol: 189 mg/dL (ref 0–200)
HDL: 65.7 mg/dL (ref >39.00)
LDL Cholesterol: 112 mg/dL — ABNORMAL HIGH (ref 0–99)
NonHDL: 123.06
Total CHOL/HDL Ratio: 3
Triglycerides: 55 mg/dL (ref 0.0–149.0)
VLDL: 11 mg/dL (ref 0.0–40.0)

## 2023-11-28 LAB — MICROALBUMIN / CREATININE URINE RATIO
Creatinine,U: 114.1 mg/dL
Microalb Creat Ratio: 10 mg/g (ref 0.0–30.0)
Microalb, Ur: 1.1 mg/dL (ref 0.0–1.9)

## 2023-11-28 LAB — VITAMIN D 25 HYDROXY (VIT D DEFICIENCY, FRACTURES): VITD: 42.22 ng/mL (ref 30.00–100.00)

## 2023-11-28 LAB — VITAMIN B12: Vitamin B-12: 672 pg/mL (ref 211–911)

## 2023-11-28 LAB — HEMOGLOBIN A1C: Hgb A1c MFr Bld: 6.1 % (ref 4.6–6.5)

## 2023-11-29 ENCOUNTER — Ambulatory Visit: Payer: Self-pay | Admitting: Family Medicine

## 2023-11-30 ENCOUNTER — Encounter: Payer: Self-pay | Admitting: Family Medicine

## 2023-12-01 ENCOUNTER — Other Ambulatory Visit: Payer: Self-pay | Admitting: Orthopaedic Surgery

## 2023-12-03 ENCOUNTER — Other Ambulatory Visit: Payer: Self-pay | Admitting: Family Medicine

## 2023-12-04 ENCOUNTER — Ambulatory Visit (INDEPENDENT_AMBULATORY_CARE_PROVIDER_SITE_OTHER): Payer: BC Managed Care – PPO | Admitting: Family Medicine

## 2023-12-04 VITALS — BP 158/92 | HR 71 | Temp 97.8°F | Ht 60.05 in | Wt 274.1 lb

## 2023-12-04 DIAGNOSIS — Z6841 Body Mass Index (BMI) 40.0 and over, adult: Secondary | ICD-10-CM | POA: Diagnosis not present

## 2023-12-04 DIAGNOSIS — E785 Hyperlipidemia, unspecified: Secondary | ICD-10-CM | POA: Diagnosis not present

## 2023-12-04 DIAGNOSIS — M160 Bilateral primary osteoarthritis of hip: Secondary | ICD-10-CM

## 2023-12-04 DIAGNOSIS — R21 Rash and other nonspecific skin eruption: Secondary | ICD-10-CM

## 2023-12-04 DIAGNOSIS — I10 Essential (primary) hypertension: Secondary | ICD-10-CM | POA: Diagnosis not present

## 2023-12-04 DIAGNOSIS — E1169 Type 2 diabetes mellitus with other specified complication: Secondary | ICD-10-CM

## 2023-12-04 DIAGNOSIS — M79605 Pain in left leg: Secondary | ICD-10-CM

## 2023-12-04 DIAGNOSIS — R931 Abnormal findings on diagnostic imaging of heart and coronary circulation: Secondary | ICD-10-CM

## 2023-12-04 DIAGNOSIS — M170 Bilateral primary osteoarthritis of knee: Secondary | ICD-10-CM

## 2023-12-04 MED ORDER — AMLODIPINE BESYLATE 5 MG PO TABS: 5.0000 mg | ORAL_TABLET | Freq: Every day | ORAL | 4 refills | Status: AC

## 2023-12-04 MED ORDER — GABAPENTIN 300 MG PO CAPS: ORAL_CAPSULE | ORAL | 1 refills | Status: DC

## 2023-12-04 MED ORDER — LOSARTAN POTASSIUM-HCTZ 100-12.5 MG PO TABS: 1.0000 | ORAL_TABLET | Freq: Every day | ORAL | 4 refills | Status: AC

## 2023-12-04 MED ORDER — ASPIRIN 81 MG PO TBEC: 81.0000 mg | DELAYED_RELEASE_TABLET | ORAL | Status: AC

## 2023-12-04 NOTE — Progress Notes (Unsigned)
 Ph: [phone] Fax: [phone]   Patient ID: Leah Peterson, female    DOB: Aug 15, 1955, 68 y.o.   MRN: [id number]  This visit was conducted in person.  BP (!) 158/92   Pulse 71   Temp 97.8 F (36.6 C) (Oral)   Ht 5' 0.05 (1.525 m)   Wt 274 lb 2 oz (124.3 kg)   SpO2 99%   BMI 53.45 kg/m   BP Readings from Last 3 Encounters:  12/04/23 (!) 158/92  06/05/23 138/86  12/02/22 138/86  Elevated on repeat testing  CC: 6 mo f/u visit  Subjective:   HPI: Leah Peterson is a 68 y.o. female presenting on 12/04/2023 for Medical Management of Chronic Issues   May need to return to office 01/2024 - works out of [place].   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   DEXA 10/2023: T score 0.3 LFN - normal  CAC 06/2023 - 283 (94%), all at [facility] - start atorva 20mg  daily, aspirin  81mg    Taking aspirin  MWF.  Did not try atorvastatin  -worried about worsening myalgias.   HTN - on amlodipine  and Hyzaar - doesn't check bp at home. No missed doses. No chest pain or dyspnea.   Known L hip and knee osteoarthritis followed by Davene Jaeger and has been recommended bariatric surgery (goal BMI <40) prior to L knee replacement. She is hesitant for bariatric surgery in abdominal surgical history.  Last received L hip steroid injection 12/2022 by Dr Burnetta. Most recently stated on celebrex  200mg  daily. Continue ambulating with cane. Asks about viscosupplementation injections for knee osteoarthritis.   Notes worsening L leg pain with prolonged sitting - describes pain starting lateral hip with radiation down knee and into toes needles and cold water  sensation down the leg. Left lateral lower leg pain pins/needles, tingling burning pain. No right leg pain. No back or buttock pain   12 lb weight loss in the past year.   Noted skin rash over  the past 2 wks, develop to left lower leg at anterior and medial ankle     Relevant past medical, surgical, family and social history reviewed and updated as indicated. Interim medical history since our last visit reviewed. Allergies and medications reviewed and updated. Outpatient Medications Prior to Visit  Medication Sig Dispense Refill   Acetaminophen  (TYLENOL  ARTHRITIS EXT RELIEF PO) Take by mouth.     celecoxib  (CELEBREX ) 200 MG capsule TAKE 1 CAPSULE BY MOUTH TWICE A DAY 30 capsule 3   cetirizine (ZYRTEC) 10 MG tablet Take 10 mg by mouth at bedtime as needed for allergies.     Cholecalciferol (VITAMIN D ) 2000 units CAPS Take 1 capsule (2,000 Units total) by mouth daily. 30 capsule    latanoprost (XALATAN) 0.005 % ophthalmic solution Place 1 drop into both eyes at bedtime.   0   Multiple Vitamin (MULTIVITAMIN WITH MINERALS) TABS tablet Take 1 tablet by mouth daily.     timolol (TIMOPTIC) 0.5 % ophthalmic solution INSTILL 1 DROP INTO BOTH EYES EVERY DAY     trolamine salicylate (ASPERCREME) 10 % cream Apply 1 application topically as needed (for knee pain).      amLODipine  (NORVASC ) 5 MG tablet Take 1 tablet (5 mg total) by mouth daily. 90 tablet 4   aspirin  EC 81 MG tablet Take 1 tablet (81 mg total) by mouth daily. Swallow whole.  atorvastatin  (LIPITOR) 20 MG tablet Take 1 tablet (20 mg total) by mouth daily. 90 tablet 3   losartan -hydrochlorothiazide  (HYZAAR) 100-12.5 MG tablet Take 1 tablet by mouth daily. 90 tablet 4   UNABLE TO FIND Med Name: Joint support condroitin     atorvastatin  (LIPITOR) 20 MG tablet Take 1 tablet (20 mg total) by mouth every Monday, Wednesday, and Friday.     No facility-administered medications prior to visit.     Per HPI unless specifically indicated in ROS section below Review of Systems  Objective:  BP (!) 158/92   Pulse 71   Temp 97.8 F (36.6 C) (Oral)   Ht 5' 0.05 (1.525 m)   Wt 274 lb 2 oz (124.3 kg)   SpO2 99%   BMI 53.45 kg/m   Wt  Readings from Last 3 Encounters:  12/04/23 274 lb 2 oz (124.3 kg)  06/05/23 278 lb 8 oz (126.3 kg)  12/13/22 286 lb (129.7 kg)      Physical Exam Vitals and nursing note reviewed.  Constitutional:      Appearance: Normal appearance.  HENT:     Mouth/Throat:     Mouth: Mucous membranes are moist.     Pharynx: Oropharynx is clear. No oropharyngeal exudate or posterior oropharyngeal erythema.  Cardiovascular:     Rate and Rhythm: Normal rate and regular rhythm.     Pulses: Normal pulses.     Heart sounds: Normal heart sounds. No murmur heard. Pulmonary:     Effort: Pulmonary effort is normal. No respiratory distress.     Breath sounds: Normal breath sounds. No wheezing, rhonchi or rales.  Musculoskeletal:        General: Swelling and tenderness present.     Right lower leg: Edema present.     Left lower leg: Edema present.     Comments:  Varicose veins bilaterally  Skin:    General: Skin is warm and dry.     Findings: Rash present.     Comments:  Annular rash to L anterior and medial ankle, pruritic, central clearing, rolling border  Neurological:     Mental Status: She is alert.  Psychiatric:        Mood and Affect: Mood normal.        Behavior: Behavior normal.       Results for orders placed or performed in visit on 11/28/23  Ferritin   Collection Time: 11/28/23  8:03 AM  Result Value Ref Range   Ferritin 50.8 10.0 - 291.0 ng/mL  Vitamin B12   Collection Time: 11/28/23  8:03 AM  Result Value Ref Range   Vitamin B-12 672 211 - 911 pg/mL  CBC with Differential/Platelet   Collection Time: 11/28/23  8:03 AM  Result Value Ref Range   WBC 3.9 (L) 4.0 - 10.5 K/uL   RBC 3.92 3.87 - 5.11 Mil/uL   Hemoglobin 11.3 (L) 12.0 - 15.0 g/dL   HCT 65.4 (L) 63.9 - 53.9 %   MCV 87.9 78.0 - 100.0 fl   MCHC 32.8 30.0 - 36.0 g/dL   RDW 85.6 88.4 - 84.4 %   Platelets 204.0 150.0 - 400.0 K/uL   Neutrophils Relative % 39.5 (L) 43.0 - 77.0 %   Lymphocytes Relative 51.0 (H) 12.0 -  46.0 %   Monocytes Relative 7.2 3.0 - 12.0 %   Eosinophils Relative 1.8 0.0 - 5.0 %   Basophils Relative 0.5 0.0 - 3.0 %   Neutro Abs 1.5 1.4 - 7.7 K/uL   Lymphs  Abs 2.0 0.7 - 4.0 K/uL   Monocytes Absolute 0.3 0.1 - 1.0 K/uL   Eosinophils Absolute 0.1 0.0 - 0.7 K/uL   Basophils Absolute 0.0 0.0 - 0.1 K/uL  VITAMIN D  25 Hydroxy (Vit-D Deficiency, Fractures)   Collection Time: 11/28/23  8:03 AM  Result Value Ref Range   VITD 42.22 30.00 - 100.00 ng/mL  Comprehensive metabolic panel with GFR   Collection Time: 11/28/23  8:03 AM  Result Value Ref Range   Sodium 142 135 - 145 mEq/L   Potassium 4.2 3.5 - 5.1 mEq/L   Chloride 106 96 - 112 mEq/L   CO2 26 19 - 32 mEq/L   Glucose, Bld 91 70 - 99 mg/dL   BUN 20 6 - 23 mg/dL   Creatinine, Ser 8.98 0.40 - 1.20 mg/dL   Total Bilirubin 0.5 0.2 - 1.2 mg/dL   Alkaline Phosphatase 46 39 - 117 U/L   AST 16 0 - 37 U/L   ALT 16 0 - 35 U/L   Total Protein 7.1 6.0 - 8.3 g/dL   Albumin 4.3 3.5 - 5.2 g/dL   GFR 41.68 (L) >39.99 mL/min   Calcium  9.6 8.4 - 10.5 mg/dL  Lipid panel   Collection Time: 11/28/23  8:03 AM  Result Value Ref Range   Cholesterol 189 0 - 200 mg/dL   Triglycerides 44.9 0.0 - 149.0 mg/dL   HDL 34.29 >60.99 mg/dL   VLDL 88.9 0.0 - 59.9 mg/dL   LDL Cholesterol 887 (H) 0 - 99 mg/dL   Total CHOL/HDL Ratio 3    NonHDL 123.06   Microalbumin / creatinine urine ratio   Collection Time: 11/28/23  8:03 AM  Result Value Ref Range   Microalb, Ur 1.1 0.0 - 1.9 mg/dL   Creatinine,U 885.8 mg/dL   Microalb Creat Ratio 10.0 0.0 - 30.0 mg/g  Hemoglobin A1c   Collection Time: 11/28/23  8:03 AM  Result Value Ref Range   Hgb A1c MFr Bld 6.1 4.6 - 6.5 %    Assessment & Plan:   Problem List Items Addressed This Visit   None    Meds ordered this encounter  Medications   amLODipine  (NORVASC ) 5 MG tablet    Sig: Take 1 tablet (5 mg total) by mouth daily.    Dispense:  90 tablet    Refill:  4   losartan -hydrochlorothiazide  (HYZAAR)  100-12.5 MG tablet    Sig: Take 1 tablet by mouth daily.    Dispense:  90 tablet    Refill:  4   aspirin  EC 81 MG tablet    Sig: Take 1 tablet (81 mg total) by mouth every Monday, Wednesday, and Friday. Swallow whole.   gabapentin  (NEURONTIN ) 300 MG capsule    Sig: Take 1 capsule (300 mg total) by mouth at bedtime for 3 days, THEN 1 capsule (300 mg total) 2 (two) times daily.    Dispense:  63 capsule    Refill:  1    No orders of the defined types were placed in this encounter.   Patient Instructions  Start atorvastatin  once weekly and if doing well with this then increase to twice weekly, etc titrate as tolerated.  Handicap placard application provided today.  BP was elevated today - consider stopping celebrex , monitor blood pressures at home, and let me know if staying elevated. BP log sheet provided today.  Good to see you today You could see Dr Watt sports medicine for evaluation for possible visco-supplementation (gel shots) to knees.  Leg  pain sounds nerve related (neuropathy) - try gabapentin  300mg  nightly for 3-4 nights then may increase to 300mg  twice daily, caution with sedation.  For skin rash -try clotrimazole  (Lotrimin ) antifungal cream which you can buy over the counter  Return in 4-6 weeks for blood pressure follow up visit.   Follow up plan: Return if symptoms worsen or fail to improve.  Anton Blas, MD

## 2023-12-04 NOTE — Patient Instructions (Addendum)
 Start atorvastatin  once weekly and if doing well with this then increase to twice weekly, etc titrate as tolerated.  Handicap placard application provided today.  BP was elevated today - consider stopping celebrex , monitor blood pressures at home, and let me know if staying elevated. BP log sheet provided today.  Good to see you today You could see Dr Watt sports medicine for evaluation for possible visco-supplementation (gel shots) to knees.  Leg pain sounds nerve related (neuropathy) - try gabapentin  300mg  nightly for 3-4 nights then may increase to 300mg  twice daily, caution with sedation.  For skin rash -try clotrimazole  (Lotrimin ) antifungal cream which you can buy over the counter  Return in 4-6 weeks for blood pressure follow up visit.

## 2023-12-05 ENCOUNTER — Encounter: Payer: Self-pay | Admitting: Family Medicine

## 2023-12-05 DIAGNOSIS — M79605 Pain in left leg: Secondary | ICD-10-CM | POA: Insufficient documentation

## 2023-12-05 DIAGNOSIS — R21 Rash and other nonspecific skin eruption: Secondary | ICD-10-CM | POA: Insufficient documentation

## 2023-12-05 NOTE — Assessment & Plan Note (Addendum)
 Notes worsening L lateral leg to ankle pain with prolonged sitting.  Suspect neuropathic pain - ?due to lumbar radiculopathy although denies significant low back or buttock pain.  Already on celebrex  200mg  once daily  Add gabapentin  300mg  nightly with slow titration to BID.  Update with effect.  Consider updated labwork (TSH, SPEP, ANA, ESR/CRP, vitamin B1)

## 2023-12-05 NOTE — Assessment & Plan Note (Signed)
 Did not start atorva - see below Did start aspirin  MWF.

## 2023-12-05 NOTE — Assessment & Plan Note (Addendum)
 Levels stable in prediabetes range.  Consider GLP1RA for weight loss benefit.

## 2023-12-05 NOTE — Assessment & Plan Note (Addendum)
 Chronic, deteriorated despite amlodipine  and hyzaar.  Celebrex  not very helpful - suggested stopping celebrex  and reassessing BP control.  BP log sheet provided - she will start monitoring and let me know if consistently >140/90 at home for med titration.  RTC 4-6 wks HTN f/u visit

## 2023-12-05 NOTE — Assessment & Plan Note (Signed)
 L>R knee, activity limiting. Sees ortho, rec weight loss BMI <40 prior to ortho surgery.  Last steroid injection was ~10/2022.  Discussed possible sports med eval for viscosupplementation.  Continues celebrex , but not very effective.

## 2023-12-05 NOTE — Assessment & Plan Note (Signed)
 To L ankle, suspicious for tinea corporis. Rec lotrimin  OTC BID x3-4 wks, update if ongoing or worsening rash.

## 2023-12-05 NOTE — Assessment & Plan Note (Addendum)
 Chronic, with elevated CAC 283 (94%) - did not start atorvastatin  due to concern over myalgias. Agrees to start once weekly with slow titration as tolerated. Did start aspirin  MWF. The 10-year ASCVD risk score (Arnett DK, et al., 2019) is: 29.5%   Values used to calculate the score:     Age: 68 years     Clincally relevant sex: Female     Is Non-Hispanic African American: Yes     Diabetic: Yes     Tobacco smoker: No     Systolic Blood Pressure: 158 mmHg     Is BP treated: Yes     HDL Cholesterol: 65.7 mg/dL     Total Cholesterol: 189 mg/dL

## 2023-12-05 NOTE — Assessment & Plan Note (Signed)
 Continued slow weight loss noted  Has been told goal BMI <40 prior to proceeding with orthopedic surgery

## 2023-12-05 NOTE — Assessment & Plan Note (Signed)
 This limits physical activity.  Handicap placard application filled out today.

## 2023-12-06 ENCOUNTER — Encounter: Payer: 59 | Admitting: Family Medicine

## 2023-12-20 LAB — HM DIABETES EYE EXAM

## 2023-12-21 ENCOUNTER — Encounter: Payer: Self-pay | Admitting: Family Medicine

## 2024-01-09 ENCOUNTER — Encounter: Payer: Self-pay | Admitting: Family Medicine

## 2024-01-09 ENCOUNTER — Ambulatory Visit: Admitting: Family Medicine

## 2024-01-09 VITALS — BP 130/80 | HR 95 | Temp 97.9°F | Ht 60.05 in | Wt 268.1 lb

## 2024-01-09 DIAGNOSIS — I10 Essential (primary) hypertension: Secondary | ICD-10-CM

## 2024-01-09 DIAGNOSIS — Z23 Encounter for immunization: Secondary | ICD-10-CM | POA: Diagnosis not present

## 2024-01-09 DIAGNOSIS — D259 Leiomyoma of uterus, unspecified: Secondary | ICD-10-CM

## 2024-01-09 DIAGNOSIS — R21 Rash and other nonspecific skin eruption: Secondary | ICD-10-CM

## 2024-01-09 DIAGNOSIS — R931 Abnormal findings on diagnostic imaging of heart and coronary circulation: Secondary | ICD-10-CM

## 2024-01-09 DIAGNOSIS — E1169 Type 2 diabetes mellitus with other specified complication: Secondary | ICD-10-CM

## 2024-01-09 DIAGNOSIS — R634 Abnormal weight loss: Secondary | ICD-10-CM

## 2024-01-09 DIAGNOSIS — I872 Venous insufficiency (chronic) (peripheral): Secondary | ICD-10-CM

## 2024-01-09 DIAGNOSIS — K439 Ventral hernia without obstruction or gangrene: Secondary | ICD-10-CM

## 2024-01-09 DIAGNOSIS — M79605 Pain in left leg: Secondary | ICD-10-CM | POA: Diagnosis not present

## 2024-01-09 DIAGNOSIS — K8020 Calculus of gallbladder without cholecystitis without obstruction: Secondary | ICD-10-CM

## 2024-01-09 DIAGNOSIS — K5790 Diverticulosis of intestine, part unspecified, without perforation or abscess without bleeding: Secondary | ICD-10-CM

## 2024-01-09 DIAGNOSIS — Z6841 Body Mass Index (BMI) 40.0 and over, adult: Secondary | ICD-10-CM

## 2024-01-09 MED ORDER — GABAPENTIN 300 MG PO CAPS
600.0000 mg | ORAL_CAPSULE | Freq: Every day | ORAL | 3 refills | Status: DC
Start: 2024-01-09 — End: 2024-02-07

## 2024-01-09 NOTE — Patient Instructions (Addendum)
 Flu shot today  Blood pressures are doing better.  Increase gabapentin  to 600mg  at night time only.  We may check abdominal CT scan.  Good to see you today Schedule wellness visit for after 06/04/2024.

## 2024-01-09 NOTE — Progress Notes (Unsigned)
 Ph: [phone] Fax: [phone]   Patient ID: Leah Peterson, female    DOB: Jan 19, 1956, 68 y.o.   MRN: [id number]  This visit was conducted in person.  BP 130/80   Pulse 95   Temp 97.9 F (36.6 C) (Oral)   Ht 5' 0.05 (1.525 m)   Wt 268 lb 2 oz (121.6 kg)   SpO2 99%   BMI 52.28 kg/m    CC: HTN f/u visit  Subjective:   HPI: Leah Peterson is a 68 y.o. female presenting on 01/09/2024 for Medical Management of Chronic Issues (Pt here for 4-6 week f/u HTN)   HLD - elevated CAC 283 (94%) - now on atorvastatin  and aspirin  MWF.   Ongoing slow weight loss.  Notes stopping celebrex  200mg  daily may have helped lower BP.  Notes ongoing L lateral leg pain from thigh to ankle, no shooting pain from back or buttock.  Gabapentin  may have dulled this pain.   Skin rash - managing with nightly lotrimin .  No fevers/chills, night sweats, enlarged glands, fatigue.  Appetite decreased but no abd pain.   HTN - Compliant with current antihypertensive regimen of amlodipine  5mg  daily, hyzaar 100/12.5mg  daily.  Does check blood pressures at home and brings log as per below: 115-130/70-90, HR 60-90s.  No low blood pressure readings or symptoms of dizziness/syncope.  Denies HA, vision changes, CP/tightness, SOB, leg swelling.         Relevant past medical, surgical, family and social history reviewed and updated as indicated. Interim medical history since our last visit reviewed. Allergies and medications reviewed and updated. Outpatient Medications Prior to Visit  Medication Sig Dispense Refill   Acetaminophen  (TYLENOL  ARTHRITIS EXT RELIEF PO) Take by mouth.     amLODipine  (NORVASC ) 5 MG tablet Take 1 tablet (5 mg total) by mouth daily. 90 tablet 4   aspirin  EC 81 MG tablet Take 1 tablet (81 mg total) by mouth every Monday, Wednesday, and Friday. Swallow whole.     atorvastatin  (LIPITOR) 20 MG tablet Take 1 tablet (20 mg total) by mouth every Monday, Wednesday, and Friday.      cetirizine (ZYRTEC) 10 MG tablet Take 10 mg by mouth at bedtime as needed for allergies.     Cholecalciferol (VITAMIN D ) 2000 units CAPS Take 1 capsule (2,000 Units total) by mouth daily. 30 capsule    latanoprost (XALATAN) 0.005 % ophthalmic solution Place 1 drop into both eyes at bedtime.   0   losartan -hydrochlorothiazide  (HYZAAR) 100-12.5 MG tablet Take 1 tablet by mouth daily. 90 tablet 4   Multiple Vitamin (MULTIVITAMIN WITH MINERALS) TABS tablet Take 1 tablet by mouth daily.     timolol (TIMOPTIC) 0.5 % ophthalmic solution INSTILL 1 DROP INTO BOTH EYES EVERY DAY     trolamine salicylate (ASPERCREME) 10 % cream Apply 1 application topically as needed (for knee pain).      celecoxib  (CELEBREX ) 200 MG capsule TAKE 1 CAPSULE BY MOUTH TWICE A DAY 30 capsule 3   gabapentin  (NEURONTIN ) 300 MG capsule Take 1 capsule (300 mg total) by mouth at bedtime for 3 days, THEN 1 capsule (300 mg total) 2 (two) times daily. 63 capsule 1   No facility-administered medications prior to visit.     Per HPI unless specifically indicated in ROS section below Review of Systems  Objective:  BP 130/80   Pulse 95   Temp 97.9 F (36.6 C) (Oral)   Ht 5' 0.05 (1.525 m)   Wt 268 lb 2 oz (  121.6 kg)   SpO2 99%   BMI 52.28 kg/m   Wt Readings from Last 3 Encounters:  01/09/24 268 lb 2 oz (121.6 kg)  12/04/23 274 lb 2 oz (124.3 kg)  06/05/23 278 lb 8 oz (126.3 kg)      Physical Exam Vitals and nursing note reviewed.  Constitutional:      Appearance: Normal appearance. She is not ill-appearing.  HENT:     Head: Normocephalic and atraumatic.     Mouth/Throat:     Mouth: Mucous membranes are moist.     Pharynx: Oropharynx is clear. No oropharyngeal exudate or posterior oropharyngeal erythema.  Eyes:     Extraocular Movements: Extraocular movements intact.     Pupils: Pupils are equal, round, and reactive to light.  Cardiovascular:     Rate and Rhythm: Normal rate and regular rhythm.     Pulses: Normal  pulses.     Heart sounds: Normal heart sounds. No murmur heard. Pulmonary:     Effort: Pulmonary effort is normal. No respiratory distress.     Breath sounds: Normal breath sounds. No wheezing, rhonchi or rales.  Musculoskeletal:     Right lower leg: No edema.     Left lower leg: No edema.  Skin:    General: Skin is warm and dry.     Findings: No rash.  Neurological:     Mental Status: She is alert.  Psychiatric:        Mood and Affect: Mood normal.        Behavior: Behavior normal.           Results for orders placed or performed in visit on 12/21/23  HM DIABETES EYE EXAM   Collection Time: 12/20/23  9:29 AM  Result Value Ref Range   HM Diabetic Eye Exam No Retinopathy No Retinopathy    Assessment & Plan:   Problem List Items Addressed This Visit   None Visit Diagnoses       Encounter for immunization    -  Primary   Relevant Orders   Flu vaccine HIGH DOSE PF(Fluzone Trivalent) (Completed)        Meds ordered this encounter  Medications   gabapentin  (NEURONTIN ) 300 MG capsule    Sig: Take 2 capsules (600 mg total) by mouth at bedtime.    Dispense:  60 capsule    Refill:  3    Orders Placed This Encounter  Procedures   Flu vaccine HIGH DOSE PF(Fluzone Trivalent)    Patient Instructions  Flu shot today  Blood pressures are doing better.  Increase gabapentin  to 600mg  at night time only.  We may check abdominal CT scan.  Good to see you today Schedule wellness visit for after 06/04/2024.   Follow up plan: No follow-ups on file.  Leah Blas, MD

## 2024-01-11 ENCOUNTER — Encounter: Payer: Self-pay | Admitting: Family Medicine

## 2024-01-11 ENCOUNTER — Telehealth: Payer: Self-pay | Admitting: Family Medicine

## 2024-01-11 DIAGNOSIS — K439 Ventral hernia without obstruction or gangrene: Secondary | ICD-10-CM | POA: Insufficient documentation

## 2024-01-11 DIAGNOSIS — K8020 Calculus of gallbladder without cholecystitis without obstruction: Secondary | ICD-10-CM | POA: Insufficient documentation

## 2024-01-11 MED ORDER — DIPHENHYDRAMINE HCL 50 MG PO TABS: 50.0000 mg | ORAL_TABLET | Freq: Once | ORAL | 0 refills | Status: AC

## 2024-01-11 NOTE — Assessment & Plan Note (Signed)
 H/o complex L ovarian mass due to inclusion cyst 2/2 diverticulitis s/p L salpingectomy 11/2017, then subsequent sigmoidectomy and BSO 03/2017.  Never had gen surg or gyn f/u.  Will update abdominal imaging, in setting of more prominent ventral hernia.

## 2024-01-11 NOTE — Telephone Encounter (Signed)
 Left message to return call to our office.

## 2024-01-11 NOTE — Assessment & Plan Note (Addendum)
 Ongoing - total almost 40 lbs since 2021, she is trying.  8 lbs over the past month.  See above.

## 2024-01-11 NOTE — Assessment & Plan Note (Signed)
Chronic, great control on current regimen - continue this.  ?

## 2024-01-11 NOTE — Assessment & Plan Note (Signed)
 Ongoing L lateral pain ?sciatica vs neuropathic pain - discussed gabapentin  - will change to 600mg  nightly.  Consider updated neuropathy labwork (TSH, SPEP, ANA, ESR/CRP, vit B1)

## 2024-01-11 NOTE — Assessment & Plan Note (Signed)
 Now on atorvastatin  + aspirin  MWF

## 2024-01-11 NOTE — Assessment & Plan Note (Signed)
 Notes some improvement with weight loss to date

## 2024-01-11 NOTE — Assessment & Plan Note (Deleted)
 Notes this is becoming more prominent in setting of weight loss  H/o this, incidentally noted on prior imaging 2018 Will update abdominal/pelvic CT for further evaluation as per below.

## 2024-01-11 NOTE — Assessment & Plan Note (Addendum)
 Congratulated on weight loss to date.  10 lbs over 6 months, now down to 268lbs with peak weight 305 lbs (2021). Has been told goal BMI <40 prior to orthopedic knee surgery.

## 2024-01-11 NOTE — Telephone Encounter (Signed)
 Please notify - I have ordered contrasted CT scan of abd/pelvis to [facility] to further evaluate ventral hernia as well as prior h/o surgery, weight loss.   She has contrast dye listed as an allergy - was it just itching, not any tongue/ lip or throat swelling or shortness of breath? If only itching, I recommend she take benadryl  50mg  1 hour prior to her procedure and have someone drive her.

## 2024-01-11 NOTE — Assessment & Plan Note (Addendum)
 Suspected tinea corporis - she has been treating with OTC lotrimin  and notes improvement.  Consider skin scraping for KOH next visit.  Rash localized to left ankle no other rash on body.

## 2024-01-11 NOTE — Assessment & Plan Note (Signed)
 Great control off medication, in setting of weight loss. Continue to monitor.

## 2024-01-11 NOTE — Assessment & Plan Note (Signed)
 Notes this is becoming more prominent in setting of weight loss  H/o this, incidentally noted on prior imaging 2018 Will update abdominal/pelvic CT for further evaluation as per below.

## 2024-01-11 NOTE — Assessment & Plan Note (Signed)
 Now on atorvastatin  + aspirin  MWF.

## 2024-01-11 NOTE — Telephone Encounter (Signed)
 Called patient reviewed all information and repeated back to me. Will call if any questions.  ? ?

## 2024-01-12 ENCOUNTER — Telehealth: Payer: Self-pay

## 2024-01-12 MED ORDER — PREDNISONE 50 MG PO TABS: ORAL_TABLET | ORAL | 0 refills | Status: DC

## 2024-01-12 NOTE — Telephone Encounter (Signed)
 Called patient back she has all information no other questions at this time.

## 2024-01-12 NOTE — Telephone Encounter (Unsigned)
 Copied from CRM #[id number]. Topic: General - Other >> Jan 11, 2024  4:46 PM Debby BROCKS wrote: Reason for CRM: Patient missed a call from Sebastian Danna GRADE, CMA. The best time to reach her for another call back on 01/12/2024 would be anytime after 9:15am

## 2024-01-12 NOTE — Telephone Encounter (Signed)
 Phone call to patient to review instructions for 13 hr prep for CT w/ contrast on 01/16/24  at 2:40PM. Prescription called into [facility]. Pt aware and verbalized understanding of instructions. Prescription: 01/16/24 @ 1:40AM- 50mg  Prednisone  01/16/24 @ 7:40AM- 50mg  Prednisone  01/16/24 @ 1:40PM - 50mg  Prednisone  and 50mg  Benadryl 

## 2024-01-15 ENCOUNTER — Encounter: Payer: Self-pay | Admitting: Family Medicine

## 2024-01-16 ENCOUNTER — Ambulatory Visit
Admission: RE | Admit: 2024-01-16 | Discharge: 2024-01-16 | Disposition: A | Discharge status: Home / Self Care | Source: Ambulatory Visit | Attending: Family Medicine | Admitting: Family Medicine

## 2024-01-16 DIAGNOSIS — K5790 Diverticulosis of intestine, part unspecified, without perforation or abscess without bleeding: Secondary | ICD-10-CM

## 2024-01-16 DIAGNOSIS — K8020 Calculus of gallbladder without cholecystitis without obstruction: Secondary | ICD-10-CM

## 2024-01-16 DIAGNOSIS — K439 Ventral hernia without obstruction or gangrene: Secondary | ICD-10-CM

## 2024-01-16 DIAGNOSIS — D259 Leiomyoma of uterus, unspecified: Secondary | ICD-10-CM

## 2024-01-16 DIAGNOSIS — R634 Abnormal weight loss: Secondary | ICD-10-CM

## 2024-01-16 MED ORDER — IOPAMIDOL (ISOVUE-300) INJECTION 61%
100.0000 mL | Freq: Once | INTRAVENOUS | Status: AC | PRN
Start: 2024-01-16 — End: 2024-01-16
  Administered 2024-01-16: 100 mL via INTRAVENOUS

## 2024-01-17 ENCOUNTER — Telehealth: Payer: Self-pay

## 2024-01-17 DIAGNOSIS — K5790 Diverticulosis of intestine, part unspecified, without perforation or abscess without bleeding: Secondary | ICD-10-CM

## 2024-01-17 DIAGNOSIS — Z8719 Personal history of other diseases of the digestive system: Secondary | ICD-10-CM

## 2024-01-17 DIAGNOSIS — R634 Abnormal weight loss: Secondary | ICD-10-CM

## 2024-01-17 DIAGNOSIS — R933 Abnormal findings on diagnostic imaging of other parts of digestive tract: Secondary | ICD-10-CM

## 2024-01-17 NOTE — Telephone Encounter (Signed)
 Received call from [facility] radiology regarding following report. Sending high priority message to Dr. KANDICE and letting him know as well   Increased size of small midline epigastric ventral hernia, which contains only fat.   Large stool burden noted; recommend correlation for symptoms or signs of constipation.   Diffuse colonic diverticulosis. Concentric wall thickening involving the cecum and ascending colon, with adjacentsoft tissue stranding. Differential diagnosis includes diverticulitis and colon carcinoma. Recommend clinical correlation, and consider short-term follow-up by CT after appropriate antibiotic therapy.   No evidence of metastatic disease.   Cholelithiasis. No radiographic evidence of cholecystitis.   Small uterine fibroids.   These results will be called to the ordering clinician or representative by the Radiologist Assistant, and communication documented in the PACS or [employer].

## 2024-01-17 NOTE — Telephone Encounter (Signed)
 Called patient reviewed all information and repeated back to me. Will call if any questions. Has had some constipation will try Miralax as recommended. She denies any symptoms that listed. She is ok with referral to GI.

## 2024-01-17 NOTE — Addendum Note (Signed)
 Addended by: [name] on: 01/17/2024 05:57 PM   Modules accepted: Orders

## 2024-01-17 NOTE — Telephone Encounter (Signed)
 Left message to return call to our office.

## 2024-01-17 NOTE — Telephone Encounter (Signed)
 New referral placed to [facility].

## 2024-01-17 NOTE — Telephone Encounter (Addendum)
 Plz notify pt - CT scan showed increasing ventral hernia, but only containing fat. To let us  know if develops any pain at site of hernia.  It also showed large amount of stool - if she's having any constipation symptoms, would have her take miralax 1/2-1 capful daily, holding for diarrhea.  She had tiny gallstones and small uterine fibroids as well.   It also showed diffuse diverticulosis with thickening of cecum/R colon suspicious for diverticulitis, need to rule out other cause of colon swelling. Is she having any R sided abdominal pain or bowel changes, diarrhea, fever, blood in stool? If so, would recommend starting antibiotic to treat possible diverticulitis. If no symptoms, recommend return to GI to discuss possible repeat colonoscopy.    COLONOSCOPY Date: 06/30/2015 diverticulosis, rpt 10 yrs [place])

## 2024-01-18 ENCOUNTER — Ambulatory Visit: Payer: Self-pay | Admitting: Family Medicine

## 2024-01-26 ENCOUNTER — Other Ambulatory Visit: Payer: 59

## 2024-02-04 ENCOUNTER — Other Ambulatory Visit: Payer: Self-pay | Admitting: Family Medicine

## 2024-02-04 DIAGNOSIS — M79605 Pain in left leg: Secondary | ICD-10-CM

## 2024-02-05 ENCOUNTER — Telehealth: Payer: Self-pay

## 2024-02-05 NOTE — Telephone Encounter (Signed)
 Pt has CPE on 06/19/24. Needs fasting lab visit 1 wk prior to this visit.  Lvm asking pt to call back. Pls schedule lab visit mentioned above (order request will then be sent to PCP).

## 2024-02-05 NOTE — Telephone Encounter (Signed)
 Gabapentin  Last filled:  01/01/24, #60 Last OV:  01/09/24, 6 wk HTN f/u Next OV:  06/19/24, CPE

## 2024-02-07 NOTE — Telephone Encounter (Signed)
 ERx

## 2024-02-12 ENCOUNTER — Encounter: Payer: Self-pay | Admitting: Family Medicine

## 2024-02-13 ENCOUNTER — Encounter: Payer: Self-pay | Admitting: Internal Medicine

## 2024-03-04 ENCOUNTER — Encounter: Payer: Self-pay | Admitting: Radiology

## 2024-04-18 ENCOUNTER — Ambulatory Visit: Admitting: Internal Medicine

## 2024-04-18 ENCOUNTER — Encounter: Payer: Self-pay | Admitting: Internal Medicine

## 2024-04-18 VITALS — BP 106/62 | HR 88 | Ht 61.0 in | Wt 239.4 lb

## 2024-04-18 DIAGNOSIS — R634 Abnormal weight loss: Secondary | ICD-10-CM

## 2024-04-18 DIAGNOSIS — D649 Anemia, unspecified: Secondary | ICD-10-CM | POA: Diagnosis not present

## 2024-04-18 DIAGNOSIS — K5900 Constipation, unspecified: Secondary | ICD-10-CM

## 2024-04-18 DIAGNOSIS — R933 Abnormal findings on diagnostic imaging of other parts of digestive tract: Secondary | ICD-10-CM | POA: Diagnosis not present

## 2024-04-18 MED ORDER — NA SULFATE-K SULFATE-MG SULF 17.5-3.13-1.6 GM/177ML PO SOLN: 1.0000 | ORAL | 0 refills | Status: DC

## 2024-04-18 NOTE — Progress Notes (Signed)
 Leah Peterson 68 y.o. Jun 02, 1955 [id number]  Assessment & Plan:   Encounter Diagnoses  Name Primary?   Abnormal CT scan, colon Yes   Constipation, unspecified constipation type    Normocytic anemia    Loss of weight - intentional    Evaluate abnormal cecum and ascending colon on CT scan with colonoscopy.  The risks and benefits as well as alternatives of endoscopic procedure(s) have been discussed and reviewed. All questions answered. The patient agrees to proceed.   Further plans pending this result.  I do advise MiraLAX daily and a double prep for colonoscopy given prior need to irrigate to achieve adequate prep and infrequent defecation i.e. constipation.  CC: Leah Baller, MD    Subjective:   Chief Complaint: Diverticulosis  HPI 68 year old woman known to me from prior colonoscopy in 2018, showed diverticulosis in the sigmoid colon.  She is referred back by Dr. Rilla because of CT scan changes.  CT was ordered because of a ventral hernia and weight loss and prior diverticulitis.  CT results below.  Cecum and ascending colon were abnormal.  The patient has been losing weight but she is deliberately losing weight to try to reach a goal weight her orthopedist has advised prior to having surgery due to bone-on-bone in the left knee plus left hip problems.  She ambulates with a cane and suffers with quite a bit of pain in the left lower extremity.  She does not have any dysphagia nausea vomiting or upper GI symptoms or early satiety.  She reports she has small bowel movement some days of the week but mostly not and has 1 good bowel movement each week where it all comes out.  No rectal bleeding.  She did have a sigmoid segmental colon resection when she had an abnormal ovary and ended up having a tubo-ovarian abscess and diverticulitis problems, 2018.  Wt Readings from Last 3 Encounters:  04/18/24 239 lb 6 oz (108.6 kg)  01/09/24 268 lb 2 oz (121.6 kg)   12/04/23 274 lb 2 oz (124.3 kg)     CT abdomen pelvis with contrast 01/16/2024 images personally reviewed IMPRESSION: Increased size of small midline epigastric ventral hernia, which contains only fat.   Large stool burden noted; recommend correlation for symptoms or signs of constipation.   Diffuse colonic diverticulosis. Concentric wall thickening involving the cecum and ascending colon, with adjacentsoft tissue stranding. Differential diagnosis includes diverticulitis and colon carcinoma. Recommend clinical correlation, and consider short-term follow-up by CT after appropriate antibiotic therapy.   No evidence of metastatic disease.   Cholelithiasis. No radiographic evidence of cholecystitis.   Small uterine fibroids.  Colonoscopy 2017 with sigmoid diverticulosis otherwise normal. EGD 2017 normal (persistent heartburn and vomiting)    Latest Ref Rng & Units 11/28/2023    8:03 AM 05/27/2022    9:52 AM 11/21/2018    8:28 AM  CBC  WBC 4.0 - 10.5 K/uL 3.9  4.1  4.8   Hemoglobin 12.0 - 15.0 g/dL 88.6  88.4  87.8   Hematocrit 36.0 - 46.0 % 34.5  35.0  37.4   Platelets 150.0 - 400.0 K/uL 204.0  214.0  208.0       Lab Results  Component Value Date   IRON 73 11/25/2022   TIBC 298.2 11/25/2022   FERRITIN 50.8 11/28/2023   Lab Results  Component Value Date   VITAMINB12 672 11/28/2023      Allergies[1] Active Medications[2] Past Medical History:  Diagnosis Date   Abscess of abdominal  cavity (HCC) 10/14/2013   Arthritis    KNEES   Cyst of left ovary    complex L ovarian mass due to inclusion cyst 2/2 diverticulitis s/p L salpingectomy 11/2017, then subsequent sigmoidectomy and BSO 03/2017.      Diverticulitis of large intestine without perforation or abscess without bleeding 01/16/2017   Diverticulosis of colon (without mention of hemorrhage)    DVT (deep venous thrombosis) (HCC) 2015   bilateral posterior tibial veins treated with IVC filter   Essential  hypertension, benign    Blood pressure tends to elevate with MD visits.   Gallstones    Glaucoma 09/2016   left sided mainly   History of diverticulitis    IBS (irritable bowel syndrome)    Left knee pain 2012   eval by ortho - bone spurs and DJD, treated with steroid shot which helped temporarily   Liver abscess 2015   hospitalization for severe septic shock after diverticulitis   Obesity, unspecified    Other abnormal glucose    Ovarian mass    Pure hypercholesterolemia    Transfusion history    '15-diverticulosis   Unspecified vitamin D  deficiency    resolved   Past Surgical History:  Procedure Laterality Date   CESAREAN SECTION     due to BP elevation AND BABY HEART RATE STOPPING   COLONOSCOPY  2000   Diverticuli-Dr. Jakie   COLONOSCOPY WITH PROPOFOL  N/A 06/30/2015   diverticulosis, rpt 10 yrs [name], MD)   CYSTOSCOPY WITH STENT PLACEMENT Bilateral 03/30/2017   Procedure: CYSTOSCOPY WITH VENETA MIS;  Surgeon: [name], MD;  Location: [facility];  Service: Urology;  Laterality: Bilateral;   DRAINAGE ABD/PERITON ABS PERC ([facility] HX)  2015   liver abscess   ESOPHAGOGASTRODUODENOSCOPY (EGD) WITH PROPOFOL  N/A 12/30/2015   WNL [name], MD)   IVC FILTER PLACED AND REMOVED   2015   ROBOTIC ASSISTED BILATERAL SALPINGO OOPHERECTOMY Left 12/27/2016   Attempted XI ROBOTIC ASSISTED LYSIS OF ADHESIONS AND LEFT SALPINGECTOMY;  [name], MD   ROBOTIC ASSISTED BILATERAL SALPINGO OOPHERECTOMY Bilateral 03/30/2017   L ovarian cyst, h/o diverticular absces - Procedure: XI ROBOTIC ASSISTED BILATERAL SALPINGO OOPHORECTOMY;  Surgeon: [name], MD   Social History   Social History Narrative   Caffeine: 2-3 cups coffee, 2 cups sweet tea   Lives with daughter and grand daughter.  No pets.   Divorced; 1 daughter   Occupation: Systems Developer at [employer]   Involved in town hall of [place].   Activity: granddaughter.  No regular exercise.   Diet:  fruits/vegetables daily, drinks a lot of sweet tea   family history includes Cancer in her father and mother; Diabetes in an other family member; Drug abuse in her brother; Heart disease in her father; Hypertension in her father and mother; Sleep apnea in her brother; Stroke in her maternal grandmother.   Review of Systems Knee and hip pain on left otherwise negative  Objective:   Physical Exam @BP  106/62   Pulse 88   Ht 5' 1 (1.549 m)   Wt 239 lb 6 oz (108.6 kg)   BMI 45.23 kg/m @  General:  WD, NAD Eyes:  anicteric..  Lungs: Clear to auscultation bilaterally. Heart:   S1S2, no rubs, murmurs, gallops. Abdomen:  soft, non-tender, no hepatosplenomegaly, - small, soft upper ventral hernia, no mass and BS+.  Rectal: Deferred until colonoscopy MS[facility]    Antalgic gait - left knee, ip pain. Uses a cane and needs assist Neuro:  A&O  x 3.  Psych:  appropriate mood and  Affect.   Data Reviewed: See HPI    [1]  Allergies Allergen Reactions   Iodinated Contrast Media Itching   Lisinopril Other (See Comments)    REACTION: lethargy, cough.   Penicillins Other (See Comments)    LYMPH NODES SWELL Has patient had a PCN reaction causing immediate rash, facial/tongue/throat swelling, SOB or lightheadedness with hypotension: No Has patient had a PCN reaction causing severe rash involving mucus membranes or skin necrosis: No Has patient had a PCN reaction that required hospitalization No Has patient had a PCN reaction occurring within the last 10 years: No If all of the above answers are NO, then may proceed with Cephalosporin use.   [2]  Current Meds  Medication Sig   Acetaminophen  (TYLENOL  ARTHRITIS EXT RELIEF PO) Take by mouth.   amLODipine  (NORVASC ) 5 MG tablet Take 1 tablet (5 mg total) by mouth daily.   aspirin  EC 81 MG tablet Take 1 tablet (81 mg total) by mouth every Monday, Wednesday, and Friday. Swallow whole.   atorvastatin  (LIPITOR) 20 MG tablet Take 1 tablet (20 mg total)  by mouth every Monday, Wednesday, and Friday.   cetirizine (ZYRTEC) 10 MG tablet Take 10 mg by mouth at bedtime as needed for allergies.   Cholecalciferol (VITAMIN D ) 2000 units CAPS Take 1 capsule (2,000 Units total) by mouth daily.   gabapentin  (NEURONTIN ) 300 MG capsule Take 1 capsule (300 mg total) by mouth 2 (two) times daily.   latanoprost (XALATAN) 0.005 % ophthalmic solution Place 1 drop into both eyes at bedtime.    losartan -hydrochlorothiazide  (HYZAAR) 100-12.5 MG tablet Take 1 tablet by mouth daily.   Multiple Vitamin (MULTIVITAMIN WITH MINERALS) TABS tablet Take 1 tablet by mouth daily.   predniSONE  (DELTASONE ) 50 MG tablet Pt to take 50 mg of prednisone  on 01/16/24 at 1:40AM, 50 mg of prednisone  on 01/16/24 at 7:40AM, and 50 mg of prednisone  on 01/16/24 at 1:40PM. Pt is also to take 50 mg of benadryl  on 01/16/24 at 1:40PM. Please call [phone] with any questions.   timolol (TIMOPTIC) 0.5 % ophthalmic solution INSTILL 1 DROP INTO BOTH EYES EVERY DAY   trolamine salicylate (ASPERCREME) 10 % cream Apply 1 application topically as needed (for knee pain).

## 2024-04-18 NOTE — Patient Instructions (Addendum)
 You have been scheduled for a colonoscopy. Please follow written instructions given to you at your visit today.   If you use inhalers (even only as needed), please bring them with you on the day of your procedure.  DO NOT TAKE 7 DAYS PRIOR TO TEST- Trulicity (dulaglutide) Ozempic, Wegovy (semaglutide) Mounjaro, Zepbound (tirzepatide) Bydureon Bcise (exanatide extended release)  DO NOT TAKE 1 DAY PRIOR TO YOUR TEST Rybelsus (semaglutide) Adlyxin (lixisenatide) Victoza (liraglutide) Byetta (exanatide) ___________________________________________________________________________  Take a dose of Miralax daily.  I appreciate the opportunity to care for you. Lupita Commander, MD, [facility]

## 2024-05-08 ENCOUNTER — Encounter: Payer: Self-pay | Admitting: Internal Medicine

## 2024-05-08 ENCOUNTER — Ambulatory Visit: Admitting: Internal Medicine

## 2024-05-08 VITALS — BP 147/82 | HR 66 | Temp 97.9°F | Resp 12 | Ht 61.0 in | Wt 239.0 lb

## 2024-05-08 DIAGNOSIS — R933 Abnormal findings on diagnostic imaging of other parts of digestive tract: Secondary | ICD-10-CM

## 2024-05-08 DIAGNOSIS — K633 Ulcer of intestine: Secondary | ICD-10-CM | POA: Diagnosis present

## 2024-05-08 DIAGNOSIS — K5730 Diverticulosis of large intestine without perforation or abscess without bleeding: Secondary | ICD-10-CM

## 2024-05-08 DIAGNOSIS — D128 Benign neoplasm of rectum: Secondary | ICD-10-CM

## 2024-05-08 MED ORDER — SODIUM CHLORIDE 0.9 % IV SOLN: 500.0000 mL | Freq: Once | INTRAVENOUS | Status: DC

## 2024-05-08 NOTE — Progress Notes (Signed)
 Called to room to assist during endoscopic procedure.  Patient ID and intended procedure confirmed with present staff. Received instructions for my participation in the procedure from the performing physician.

## 2024-05-08 NOTE — Progress Notes (Signed)
 To pacu, VSS. Report to [email or web address].tb

## 2024-05-08 NOTE — Progress Notes (Signed)
 History and Physical Interval Note:  05/08/2024 3:16 PM  Leah Peterson  has presented today for endoscopic procedure(s), with the diagnosis of  Encounter Diagnoses  Name Primary?   Abnormal CT scan, colon Yes   Constipation, unspecified constipation type   .  The various methods of evaluation and treatment have been discussed with the patient and/or family. After consideration of risks, benefits and other options for treatment, the patient has consented to  the endoscopic procedure(s).   The patient's history has been reviewed, patient examined, no change in status, stable for endoscopic procedure(s).  I have reviewed the patient's chart and labs.  Questions were answered to the patient's satisfaction.     Lupita CHARLENA Commander, MD, [facility]

## 2024-05-08 NOTE — Patient Instructions (Addendum)
 There was an area of swollen and inflamed tissue in the colon.  This was not where the abnormalities were seen on the CT scan you had previously.  Those areas looked okay.  I do not think you have cancer but there is some inflammation as I stated.  I took biopsies of that and we will let you know what they showed and what that means.  I also found and removed a very tiny polyp, from the rectum.  It will be analyzed.  You do have diverticulosis which are pockets or pouches in the colon wall.  This is very common and in most cases not a problem for people.  Please read the handout.  I appreciate the opportunity to care for you. Lupita CHARLENA Commander, MD, [facility]  Continue present medications. Await pathology results.  Repeat colonoscopy is recommended. The colonoscopy date will be determined after pathology results from today's exam become available for review. Please read over handouts  YOU HAD AN ENDOSCOPIC PROCEDURE TODAY AT THE [facility]:   Refer to the procedure report that was given to you for any specific questions about what was found during the examination.  If the procedure report does not answer your questions, please call your gastroenterologist to clarify.  If you requested that your care partner not be given the details of your procedure findings, then the procedure report has been included in a sealed envelope for you to review at your convenience later.  YOU SHOULD EXPECT: Some feelings of bloating in the abdomen. Passage of more gas than usual.  Walking can help get rid of the air that was put into your GI tract during the procedure and reduce the bloating. If you had a lower endoscopy (such as a colonoscopy or flexible sigmoidoscopy) you may notice spotting of blood in your stool or on the toilet paper. If you underwent a bowel prep for your procedure, you may not have a normal bowel movement for a few days.  Please Note:  You might notice some irritation and congestion in  your nose or some drainage.  This is from the oxygen used during your procedure.  There is no need for concern and it should clear up in a day or so.  SYMPTOMS TO REPORT IMMEDIATELY:  Following lower endoscopy (colonoscopy or flexible sigmoidoscopy):  Excessive amounts of blood in the stool  Significant tenderness or worsening of abdominal pains  Swelling of the abdomen that is new, acute  Fever of 100F or higher  For urgent or emergent issues, a gastroenterologist can be reached at any hour by calling [phone]. Do not use MyChart messaging for urgent concerns.    DIET:  We do recommend a small meal at first, but then you may proceed to your regular diet.  Drink plenty of fluids but you should avoid alcoholic beverages for 24 hours.  ACTIVITY:  You should plan to take it easy for the rest of today and you should NOT DRIVE or use heavy machinery until tomorrow (because of the sedation medicines used during the test).    FOLLOW UP: Our staff will call the number listed on your records the next business day following your procedure.  We will call around 7:15- 8:00 am to check on you and address any questions or concerns that you may have regarding the information given to you following your procedure. If we do not reach you, we will leave a message.     If any biopsies were taken you  will be contacted by phone or by letter within the next 1-3 weeks.  Please call us  at [phone] if you have not heard about the biopsies in 3 weeks.    SIGNATURES/CONFIDENTIALITY: You and/or your care partner have signed paperwork which will be entered into your electronic medical record.  These signatures attest to the fact that that the information above on your After Visit Summary has been reviewed and is understood.  Full responsibility of the confidentiality of this discharge information lies with you and/or your care-partner.

## 2024-05-08 NOTE — Progress Notes (Signed)
 Pt's states no medical or surgical changes since previsit or office visit.

## 2024-05-08 NOTE — Op Note (Signed)
 [facility] Patient Name: Leah Peterson Procedure Date: 05/08/2024 3:14 PM MRN: [id number] Endoscopist: Lupita FORBES Commander , MD, [id number] Age: 69 Referring MD:  Date of Birth: [birth year] Gender: Female Account #: [id number] Procedure:                Colonoscopy Indications:              Abnormal CT of the GI tract - Cecum and ascending                            colon thickened with pericolonic inflammatory change Medicines:                Monitored Anesthesia Care Procedure:                Pre-Anesthesia Assessment:                           - Prior to the procedure, a History and Physical                            was performed, and patient medications and                            allergies were reviewed. The patient's tolerance of                            previous anesthesia was also reviewed. The risks                            and benefits of the procedure and the sedation                            options and risks were discussed with the patient.                            All questions were answered, and informed consent                            was obtained. Prior Anticoagulants: The patient has                            taken no anticoagulant or antiplatelet agents. ASA                            Grade Assessment: III - A patient with severe                            systemic disease. After reviewing the risks and                            benefits, the patient was deemed in satisfactory                            condition to undergo the procedure.  After obtaining informed consent, the colonoscope                            was passed under direct vision. Throughout the                            procedure, the patient's blood pressure, pulse, and                            oxygen saturations were monitored continuously. The                            Olympus Scope SN: [id number] was introduced through                             the anus and advanced to the the terminal ileum,                            with identification of the appendiceal orifice and                            IC valve. The colonoscopy was performed without                            difficulty. The patient tolerated the procedure                            well. The quality of the bowel preparation was                            good. The terminal ileum, ileocecal valve,                            appendiceal orifice, and rectum were photographed.                            The bowel preparation used was SUPREP via split                            dose instruction. Scope In: 3:38:05 PM Scope Out: 3:51:51 PM Scope Withdrawal Time: 0 hours 10 minutes 5 seconds  Total Procedure Duration: 0 hours 13 minutes 46 seconds  Findings:                 The perianal and digital rectal examinations were                            normal.                           Discontinuous areas of ulcerated mucosa were                            present in the proximal sigmoid colon. Biopsies  were taken with a cold forceps for histology.                            Verification of patient identification for the                            specimen was done. Estimated blood loss was minimal.                           A 3 mm polyp was found in the rectum. The polyp was                            sessile. The polyp was removed with a cold snare.                            Resection and retrieval were complete. Verification                            of patient identification for the specimen was                            done. Estimated blood loss was minimal.                           Multiple diverticula were found in the entire colon.                           The terminal ileum appeared normal.                           The exam was otherwise without abnormality on                            direct and retroflexion views. Complications:             No immediate complications. Estimated Blood Loss:     Estimated blood loss was minimal. Impression:               - Mucosal ulceration in the proximal sigmoid colon.                            Biopsied. 10 cm area of edematous and ulcerated                            (serpiginous) mucosa - most consistent with                            ischemia, IBD possible                           - One 3 mm polyp in the rectum, removed with a cold                            snare. Resected and retrieved.                           -  Diverticulosis in the entire examined colon.                           - The examined portion of the ileum was normal.                           - The examination was otherwise normal on direct                            and retroflexion views. The cecum and ascending                            colon that were abnormal on CT not seen here so ?                            CT artifact Recommendation:           - Patient has a contact number available for                            emergencies. The signs and symptoms of potential                            delayed complications were discussed with the                            patient. Return to normal activities tomorrow.                            Written discharge instructions were provided to the                            patient.                           - Resume previous diet.                           - Continue present medications.                           - Await pathology results.                           - Repeat colonoscopy is recommended. The                            colonoscopy date will be determined after pathology                            results from today's exam become available for                            review. Lupita FORBES Commander, MD 05/08/2024 4:04:28 PM This report has been signed electronically.

## 2024-05-09 ENCOUNTER — Telehealth: Payer: Self-pay

## 2024-05-09 NOTE — Telephone Encounter (Signed)
 No answer, left message to call if having any issues or concerns, B.Vester Titsworth RN

## 2024-05-13 LAB — SURGICAL PATHOLOGY

## 2024-05-14 ENCOUNTER — Ambulatory Visit: Payer: Self-pay | Admitting: Internal Medicine

## 2024-06-10 ENCOUNTER — Other Ambulatory Visit

## 2024-06-19 ENCOUNTER — Encounter: Admitting: Family Medicine
